# Patient Record
Sex: Female | Born: 1965
Health system: Southern US, Community
[De-identification: ages and names within clinical notes are randomized; demographics above are authoritative.]

## PROBLEM LIST (undated history)

## (undated) DIAGNOSIS — R000 Tachycardia, unspecified: Secondary | ICD-10-CM

## (undated) DIAGNOSIS — H548 Legal blindness, as defined in USA: Secondary | ICD-10-CM

## (undated) DIAGNOSIS — Q969 Turner's syndrome, unspecified: Secondary | ICD-10-CM

## (undated) DIAGNOSIS — H543 Unqualified visual loss, both eyes: Secondary | ICD-10-CM

## (undated) DIAGNOSIS — M810 Age-related osteoporosis without current pathological fracture: Secondary | ICD-10-CM

## (undated) DIAGNOSIS — K5790 Diverticulosis of intestine, part unspecified, without perforation or abscess without bleeding: Secondary | ICD-10-CM

## (undated) DIAGNOSIS — R945 Abnormal results of liver function studies: Secondary | ICD-10-CM

## (undated) DIAGNOSIS — D126 Benign neoplasm of colon, unspecified: Secondary | ICD-10-CM

## (undated) DIAGNOSIS — E109 Type 1 diabetes mellitus without complications: Secondary | ICD-10-CM

## (undated) DIAGNOSIS — H269 Unspecified cataract: Secondary | ICD-10-CM

## (undated) DIAGNOSIS — E785 Hyperlipidemia, unspecified: Secondary | ICD-10-CM

## (undated) DIAGNOSIS — R0600 Dyspnea, unspecified: Secondary | ICD-10-CM

## (undated) DIAGNOSIS — E11319 Type 2 diabetes mellitus with unspecified diabetic retinopathy without macular edema: Secondary | ICD-10-CM

## (undated) DIAGNOSIS — M1990 Unspecified osteoarthritis, unspecified site: Secondary | ICD-10-CM

## (undated) DIAGNOSIS — R7989 Other specified abnormal findings of blood chemistry: Secondary | ICD-10-CM

## (undated) DIAGNOSIS — I10 Essential (primary) hypertension: Secondary | ICD-10-CM

## (undated) DIAGNOSIS — K648 Other hemorrhoids: Secondary | ICD-10-CM

## (undated) DIAGNOSIS — H3320 Serous retinal detachment, unspecified eye: Secondary | ICD-10-CM

## (undated) HISTORY — DX: Age-related osteoporosis without current pathological fracture: M81.0

## (undated) HISTORY — PX: CATARACT EXTRACTION: SUR2

## (undated) HISTORY — DX: Hyperlipidemia, unspecified: E78.5

## (undated) HISTORY — DX: Other hemorrhoids: K64.8

## (undated) HISTORY — PX: RETINAL DETACHMENT SURGERY: SHX105

## (undated) HISTORY — DX: Type 2 diabetes mellitus with unspecified diabetic retinopathy without macular edema: E11.319

## (undated) HISTORY — DX: Unspecified cataract: H26.9

## (undated) HISTORY — DX: Unspecified osteoarthritis, unspecified site: M19.90

## (undated) HISTORY — DX: Turner's syndrome, unspecified: Q96.9

## (undated) HISTORY — DX: Dyspnea, unspecified: R06.00

## (undated) HISTORY — DX: Essential (primary) hypertension: I10

## (undated) HISTORY — PX: TONSILLECTOMY: SUR1361

## (undated) HISTORY — DX: Unqualified visual loss, both eyes: H54.3

## (undated) HISTORY — DX: Abnormal results of liver function studies: R94.5

## (undated) HISTORY — DX: Diverticulosis of intestine, part unspecified, without perforation or abscess without bleeding: K57.90

## (undated) HISTORY — PX: OTHER SURGICAL HISTORY: SHX169

## (undated) HISTORY — DX: Other specified abnormal findings of blood chemistry: R79.89

## (undated) HISTORY — DX: Benign neoplasm of colon, unspecified: D12.6

## (undated) HISTORY — DX: Type 1 diabetes mellitus without complications: E10.9

## (undated) HISTORY — DX: Tachycardia, unspecified: R00.0

## (undated) HISTORY — DX: Serous retinal detachment, unspecified eye: H33.20

## (undated) HISTORY — DX: Legal blindness, as defined in USA: H54.8

## (undated) HISTORY — PX: CHOLECYSTECTOMY: SHX55

---

## 2007-01-14 ENCOUNTER — Other Ambulatory Visit: Admission: RE | Admit: 2007-01-14 | Discharge: 2007-01-14 | Payer: Self-pay | Admitting: Obstetrics and Gynecology

## 2009-08-31 ENCOUNTER — Emergency Department (HOSPITAL_COMMUNITY): Admission: EM | Admit: 2009-08-31 | Discharge: 2009-08-31 | Payer: Self-pay | Admitting: Family Medicine

## 2009-09-07 ENCOUNTER — Ambulatory Visit: Payer: Self-pay | Admitting: Internal Medicine

## 2009-09-07 ENCOUNTER — Encounter: Payer: Self-pay | Admitting: Cardiovascular Disease

## 2009-10-06 ENCOUNTER — Encounter (INDEPENDENT_AMBULATORY_CARE_PROVIDER_SITE_OTHER): Payer: Self-pay | Admitting: Family Medicine

## 2009-10-06 ENCOUNTER — Ambulatory Visit: Payer: Self-pay | Admitting: Internal Medicine

## 2009-10-06 LAB — CONVERTED CEMR LAB
ALT: 125 units/L — ABNORMAL HIGH (ref 0–35)
AST: 109 units/L — ABNORMAL HIGH (ref 0–37)
Albumin: 3.7 g/dL (ref 3.5–5.2)
Basophils Absolute: 0 10*3/uL (ref 0.0–0.1)
Calcium: 8.9 mg/dL (ref 8.4–10.5)
Chloride: 105 meq/L (ref 96–112)
Cholesterol: 273 mg/dL — ABNORMAL HIGH (ref 0–200)
Creatinine, Ser: 0.5 mg/dL (ref 0.40–1.20)
Eosinophils Absolute: 0 10*3/uL (ref 0.0–0.7)
HCT: 44.2 % (ref 36.0–46.0)
HDL: 93 mg/dL (ref >39)
Hemoglobin: 14.5 g/dL (ref 12.0–15.0)
MCV: 93.1 fL (ref 78.0–100.0)
Monocytes Absolute: 0.4 10*3/uL (ref 0.1–1.0)
Neutrophils Relative %: 82 % — ABNORMAL HIGH (ref 43–77)
RBC: 4.75 M/uL (ref 3.87–5.11)
Sodium: 143 meq/L (ref 135–145)
TSH: 1.372 microintl units/mL (ref 0.350–4.500)
Total Bilirubin: 0.6 mg/dL (ref 0.3–1.2)
Total CHOL/HDL Ratio: 2.9
Triglycerides: 87 mg/dL (ref <150)
VLDL: 17 mg/dL (ref 0–40)

## 2009-10-12 ENCOUNTER — Encounter (INDEPENDENT_AMBULATORY_CARE_PROVIDER_SITE_OTHER): Payer: Self-pay | Admitting: Internal Medicine

## 2009-10-12 LAB — CONVERTED CEMR LAB: Hep B C IgM: NEGATIVE

## 2009-10-15 ENCOUNTER — Encounter: Payer: Self-pay | Admitting: Cardiovascular Disease

## 2009-10-15 ENCOUNTER — Ambulatory Visit: Payer: Self-pay | Admitting: Internal Medicine

## 2009-10-15 ENCOUNTER — Encounter (INDEPENDENT_AMBULATORY_CARE_PROVIDER_SITE_OTHER): Payer: Self-pay | Admitting: Family Medicine

## 2009-10-15 LAB — CONVERTED CEMR LAB
Ferritin: 233 ng/mL (ref 10–291)
Iron: 69 ug/dL (ref 42–145)
TIBC: 249 ug/dL — ABNORMAL LOW (ref 250–470)

## 2009-10-21 ENCOUNTER — Ambulatory Visit: Payer: Self-pay | Admitting: Family Medicine

## 2009-10-22 ENCOUNTER — Encounter: Payer: Self-pay | Admitting: Cardiovascular Disease

## 2009-10-22 ENCOUNTER — Ambulatory Visit (HOSPITAL_COMMUNITY): Admission: RE | Admit: 2009-10-22 | Discharge: 2009-10-22 | Payer: Self-pay | Admitting: Internal Medicine

## 2009-10-28 ENCOUNTER — Ambulatory Visit: Payer: Self-pay | Admitting: Internal Medicine

## 2009-10-29 ENCOUNTER — Encounter: Admission: RE | Admit: 2009-10-29 | Discharge: 2009-10-29 | Payer: Self-pay | Admitting: Internal Medicine

## 2009-11-16 ENCOUNTER — Ambulatory Visit: Payer: Self-pay | Admitting: Internal Medicine

## 2010-04-30 ENCOUNTER — Emergency Department (HOSPITAL_COMMUNITY): Admission: EM | Admit: 2010-04-30 | Discharge: 2010-04-30 | Payer: Self-pay | Admitting: Family Medicine

## 2010-06-10 ENCOUNTER — Emergency Department (HOSPITAL_COMMUNITY)
Admission: EM | Admit: 2010-06-10 | Discharge: 2010-06-10 | Payer: Self-pay | Source: Home / Self Care | Admitting: Emergency Medicine

## 2010-06-30 ENCOUNTER — Encounter (INDEPENDENT_AMBULATORY_CARE_PROVIDER_SITE_OTHER): Payer: Self-pay | Admitting: *Deleted

## 2010-06-30 ENCOUNTER — Encounter: Payer: Self-pay | Admitting: Cardiovascular Disease

## 2010-06-30 LAB — CONVERTED CEMR LAB
ALT: 110 units/L — ABNORMAL HIGH (ref 0–35)
Alkaline Phosphatase: 338 units/L — ABNORMAL HIGH (ref 39–117)
Basophils Relative: 1 % (ref 0–1)
CO2: 24 meq/L (ref 19–32)
Chloride: 98 meq/L (ref 96–112)
Eosinophils Absolute: 0.1 10*3/uL (ref 0.0–0.7)
HCT: 40.2 % (ref 36.0–46.0)
Hemoglobin: 13.5 g/dL (ref 12.0–15.0)
Lymphocytes Relative: 18 % (ref 12–46)
Lymphs Abs: 1.9 10*3/uL (ref 0.7–4.0)
MCHC: 33.6 g/dL (ref 30.0–36.0)
MCV: 90.1 fL (ref 78.0–100.0)
Microalb, Ur: 3.25 mg/dL — ABNORMAL HIGH (ref 0.00–1.89)
Monocytes Relative: 6 % (ref 3–12)
Sed Rate: 38 mm/hr — ABNORMAL HIGH (ref 0–22)
Sodium: 135 meq/L (ref 135–145)
WBC: 10.2 10*3/uL (ref 4.0–10.5)

## 2010-07-04 ENCOUNTER — Encounter (INDEPENDENT_AMBULATORY_CARE_PROVIDER_SITE_OTHER): Payer: Self-pay | Admitting: Internal Medicine

## 2010-07-04 LAB — CONVERTED CEMR LAB
HCV Ab: NEGATIVE
Hep A Total Ab: NEGATIVE
Hep B Core Total Ab: NEGATIVE
Hep B S Ab: NEGATIVE

## 2010-09-27 LAB — GLUCOSE, CAPILLARY
Glucose-Capillary: 167 mg/dL — ABNORMAL HIGH (ref 70–99)
Glucose-Capillary: 85 mg/dL (ref 70–99)

## 2010-10-06 LAB — GLUCOSE, CAPILLARY: Glucose-Capillary: 476 mg/dL — ABNORMAL HIGH (ref 70–99)

## 2010-10-07 ENCOUNTER — Encounter: Payer: Self-pay | Admitting: Cardiovascular Disease

## 2010-10-21 ENCOUNTER — Encounter: Payer: Self-pay | Admitting: Cardiovascular Disease

## 2010-10-25 ENCOUNTER — Encounter: Payer: Self-pay | Admitting: Cardiovascular Disease

## 2010-10-25 ENCOUNTER — Ambulatory Visit (INDEPENDENT_AMBULATORY_CARE_PROVIDER_SITE_OTHER): Payer: Self-pay | Admitting: Cardiovascular Disease

## 2010-10-25 VITALS — BP 142/80 | HR 95 | Resp 18 | Ht <= 58 in | Wt 126.0 lb

## 2010-10-25 DIAGNOSIS — R0989 Other specified symptoms and signs involving the circulatory and respiratory systems: Secondary | ICD-10-CM

## 2010-10-25 DIAGNOSIS — R002 Palpitations: Secondary | ICD-10-CM | POA: Insufficient documentation

## 2010-10-25 DIAGNOSIS — R0600 Dyspnea, unspecified: Secondary | ICD-10-CM | POA: Insufficient documentation

## 2010-10-25 NOTE — Assessment & Plan Note (Signed)
Non-exertional dyspnea. Will arrange echo to exclude structural heart disease.

## 2010-10-25 NOTE — Patient Instructions (Signed)
Your physician recommends that you schedule a follow-up appointment in: 2-3 weeks with Dr. Clifton James Your physician has requested that you have an echocardiogram. Echocardiography is a painless test that uses sound waves to create images of your heart. It provides your doctor with information about the size and shape of your heart and how well your heart's chambers and valves are working. This procedure takes approximately one hour. There are no restrictions for this procedure.  Your physician has recommended that you wear a holter monitor for 48 hours. Holter monitors are medical devices that record the heart's electrical activity. Doctors most often use these monitors to diagnose arrhythmias. Arrhythmias are problems with the speed or rhythm of the heartbeat. The monitor is a small, portable device. You can wear one while you do your normal daily activities. This is usually used to diagnose what is causing palpitations/syncope (passing out).

## 2010-10-25 NOTE — Progress Notes (Signed)
History of Present Illness:45 yo female with history of DM, HTN, hyperlipidemia, Turner's syndrome who is here today as a self referral for evaluation of SOB and palpitations. She tells me that she has a high heart rate at rest, with HR of 95-105 in am. Over the last year, she has noticed palpitations, feeling that her heart is racing. 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 No chest pain. She also notes dyspnea. These episodes occur 4-5 times per week. She has had no dizziness, near syncope or syncope.   Past Medical History  Diagnosis Date  . Dyspnea   . Tachycardia   . DM (diabetes mellitus)   . Hypertension   . Hyperlipidemia     Past Surgical History  Procedure Date  . Hepatic adenoma removed   . Tonsillectomy     Current Outpatient Prescriptions  Medication Sig Dispense Refill  . insulin NPH (HUMULIN N,NOVOLIN N) 100 UNIT/ML injection Inject into the skin as directed.        . insulin regular (HUMULIN R,NOVOLIN R) 100 UNIT/ML injection Inject into the skin as directed.        Marland Kitchen lisinopril-hydrochlorothiazide (PRINZIDE,ZESTORETIC) 20-25 MG per tablet Take by mouth daily. 1/2 tab daily        No Known Allergies  History   Social History  . Marital Status: Married    Spouse Name: N/A    Number of Children: N/A  . Years of Education: N/A   Occupational History  . Not on file.   Social History Main Topics  . Smoking status: Former Games developer  . Smokeless tobacco: Not on file   Comment: many years ago  . Alcohol Use: No  . Drug Use: No  . Sexually Active: Not on file   Other Topics Concern  . Not on file   Social History Narrative  . No narrative on file    No family history on file.  Review of Systems:  As stated in the HPI and otherwise negative.   BP 142/80  Pulse 95  Resp 18  Ht 4\' 10"  (1.473 m)  Wt 126 lb (57.153 kg)  BMI 26.33 kg/m2  Physical Examination: General: Well developed, well nourished, NAD HEENT: OP clear, mucus membranes  moist SKIN: warm, dry. No rashes. Neuro: No focal deficits Musculoskeletal: Muscle strength 5/5 all ext Psychiatric: Mood and affect normal Neck: No JVD, no carotid bruits, no thyromegaly, no lymphadenopathy. Lungs:Clear bilaterally, no wheezes, rhonci, crackles Cardiovascular: Regular rate and rhythm. No murmurs, gallops or rubs. Abdomen:Soft. Bowel sounds present. Non-tender.  Extremities: No lower extremity edema. Pulses are 2 + in the bilateral DP/PT.  EKG:NSR, rate 95 bpm. Normal EKG.

## 2010-10-25 NOTE — Assessment & Plan Note (Signed)
Will arrange 48 hour holter monitor.

## 2010-10-28 ENCOUNTER — Ambulatory Visit (INDEPENDENT_AMBULATORY_CARE_PROVIDER_SITE_OTHER): Payer: Medicaid Other

## 2010-10-28 DIAGNOSIS — R0600 Dyspnea, unspecified: Secondary | ICD-10-CM

## 2010-10-28 DIAGNOSIS — R002 Palpitations: Secondary | ICD-10-CM

## 2010-11-01 ENCOUNTER — Ambulatory Visit (HOSPITAL_COMMUNITY): Payer: Medicaid Other | Attending: Cardiovascular Disease | Admitting: Radiology

## 2010-11-01 DIAGNOSIS — R0609 Other forms of dyspnea: Secondary | ICD-10-CM | POA: Insufficient documentation

## 2010-11-01 DIAGNOSIS — R002 Palpitations: Secondary | ICD-10-CM | POA: Insufficient documentation

## 2010-11-01 DIAGNOSIS — I10 Essential (primary) hypertension: Secondary | ICD-10-CM | POA: Insufficient documentation

## 2010-11-01 DIAGNOSIS — I059 Rheumatic mitral valve disease, unspecified: Secondary | ICD-10-CM | POA: Insufficient documentation

## 2010-11-01 DIAGNOSIS — R0600 Dyspnea, unspecified: Secondary | ICD-10-CM

## 2010-11-01 DIAGNOSIS — E785 Hyperlipidemia, unspecified: Secondary | ICD-10-CM | POA: Insufficient documentation

## 2010-11-01 DIAGNOSIS — I319 Disease of pericardium, unspecified: Secondary | ICD-10-CM | POA: Insufficient documentation

## 2010-11-01 DIAGNOSIS — R0989 Other specified symptoms and signs involving the circulatory and respiratory systems: Secondary | ICD-10-CM | POA: Insufficient documentation

## 2010-11-01 DIAGNOSIS — E119 Type 2 diabetes mellitus without complications: Secondary | ICD-10-CM | POA: Insufficient documentation

## 2010-11-03 ENCOUNTER — Telehealth: Payer: Self-pay | Admitting: Cardiovascular Disease

## 2010-11-03 NOTE — Telephone Encounter (Signed)
Normal echo. Can we let the pt know? Thanks, chris

## 2010-11-03 NOTE — Telephone Encounter (Signed)
Patient is aware of test/lab results.  

## 2010-11-08 ENCOUNTER — Ambulatory Visit: Payer: Self-pay | Admitting: Cardiovascular Disease

## 2010-11-09 ENCOUNTER — Encounter: Payer: Self-pay | Admitting: Cardiovascular Disease

## 2010-11-09 ENCOUNTER — Ambulatory Visit (INDEPENDENT_AMBULATORY_CARE_PROVIDER_SITE_OTHER): Payer: Medicaid Other | Admitting: Cardiovascular Disease

## 2010-11-09 VITALS — BP 122/74 | HR 104 | Resp 17 | Ht <= 58 in | Wt 124.8 lb

## 2010-11-09 DIAGNOSIS — R002 Palpitations: Secondary | ICD-10-CM

## 2010-11-09 MED ORDER — DILTIAZEM HCL ER COATED BEADS 120 MG PO CP24: 120.0000 mg | ORAL_CAPSULE | Freq: Every day | ORAL | Status: DC

## 2010-11-09 NOTE — Assessment & Plan Note (Signed)
Holter monitor with PACs. Echo normal. Pt has resting tachycardia with the premature beats. Will start Cardizem CD 120 mg po Qdaily.

## 2010-11-09 NOTE — Patient Instructions (Signed)
Your physician recommends that you schedule a follow-up appointment in: 6 months with Dr. Clifton James  Your physician has recommended you make the following change in your medication: START CARDIZEM 120 mg daily.

## 2010-11-09 NOTE — Progress Notes (Signed)
History of Present Illness:45 yo female with history of DM, HTN, hyperlipidemia, Turner's syndrome who is here today for cardiac follow up. I saw her two weeks ago as a new patient  for evaluation of SOB and palpitations. She told  me that she has a high heart rate at rest, with HR of 95-105 in am. Over the last year, she has noticed palpitations, feeling that her heart is racing. 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 No chest pain. She also notes dyspnea. These episodes occur 4-5 times per week. She has had no dizziness, near syncope or syncope. I arranged a 48 Hour Holter monitor and an echo. Her Holter monitor showed PACs. There was a lot of artifact on the monitor. NO SVT. Her echo was normal.    Past Medical History  Diagnosis Date  . Dyspnea   . Tachycardia   . DM (diabetes mellitus)   . Hypertension   . Hyperlipidemia   . Turner's syndrome     Past Surgical History  Procedure Date  . Hepatic adenoma removed   . Tonsillectomy     Current Outpatient Prescriptions  Medication Sig Dispense Refill  . insulin NPH (HUMULIN N,NOVOLIN N) 100 UNIT/ML injection Inject into the skin as directed.        . insulin regular (HUMULIN R,NOVOLIN R) 100 UNIT/ML injection Inject into the skin as directed.        Marland Kitchen lisinopril-hydrochlorothiazide (PRINZIDE,ZESTORETIC) 20-25 MG per tablet Take by mouth daily. 1/2 tab daily      . Vitamin D, Ergocalciferol, (DRISDOL) 50000 UNITS CAPS Take 50,000 Units by mouth every 7 (seven) days.          No Known Allergies  History   Social History  . Marital Status: Married    Spouse Name: N/A    Number of Children: N/A  . Years of Education: N/A   Occupational History  . Not on file.   Social History Main Topics  . Smoking status: Former Games developer  . Smokeless tobacco: Not on file   Comment: many years ago  . Alcohol Use: No  . Drug Use: No  . Sexually Active: Not on file   Other Topics Concern  . Not on file   Social History  Narrative  . No narrative on file    No family history on file.  Review of Systems:  As stated in the HPI and otherwise negative.   BP 122/74  Pulse 104  Resp 17  Ht 4\' 10"  (1.473 m)  Wt 124 lb 12.8 oz (56.609 kg)  BMI 26.08 kg/m2  Physical Examination: General: Well developed, well nourished, NAD HEENT: OP clear, mucus membranes moist SKIN: warm, dry. No rashes. Neuro: No focal deficits Musculoskeletal: Muscle strength 5/5 all ext Psychiatric: Mood and affect normal Neck: No JVD, no carotid bruits, no thyromegaly, no lymphadenopathy. Lungs:Clear bilaterally, no wheezes, rhonci, crackles Cardiovascular: Regular rate and rhythm. No murmurs, gallops or rubs. Abdomen:Soft. Bowel sounds present. Non-tender.  Extremities: No lower extremity edema. Pulses are 2 + in the bilateral DP/PT.  Echo 11/01/10:  Left ventricle: The cavity size was normal. Wall thickness was normal. Systolic function was normal. The estimated ejection fraction was in the range of 60% to 65%. Wall motion was normal; there were no regional wall motion abnormalities. Doppler parameters are consistent with abnormal left ventricular relaxation (grade 1 diastolic dysfunction). - Pericardium, extracardiac: A trivial pericardial effusion was identified.

## 2010-11-12 ENCOUNTER — Inpatient Hospital Stay (INDEPENDENT_AMBULATORY_CARE_PROVIDER_SITE_OTHER)
Admission: RE | Admit: 2010-11-12 | Discharge: 2010-11-12 | Disposition: A | Discharge status: Home / Self Care | Payer: Medicaid Other | Source: Ambulatory Visit | Attending: Emergency Medicine | Admitting: Emergency Medicine

## 2010-11-12 DIAGNOSIS — E119 Type 2 diabetes mellitus without complications: Secondary | ICD-10-CM

## 2010-11-18 ENCOUNTER — Telehealth: Payer: Self-pay | Admitting: Cardiovascular Disease

## 2010-11-18 NOTE — Telephone Encounter (Signed)
Has question about meds. Thinks she missed a return call.

## 2010-11-18 NOTE — Telephone Encounter (Signed)
Pt calling re new meds she has been on for a week and a half now has questions

## 2010-11-21 NOTE — Telephone Encounter (Signed)
LMTCB Tina [name]   PT RETURN CALL STILL CONTINUES TO HAVE HR IN MID 80'S BEFORE GETTING OUT OF BED  ALSO B/P IS STILL RUNNING 135-140/85 FEELS OKAY WAS JUST RECENTLY STARTED  ON CARDIZEM 120 MG QD PT AWARE WILL FORWARD TO DR Antonietta Breach

## 2010-11-21 NOTE — Telephone Encounter (Signed)
Would not make any changes at this time. HR in the 80s is ok. Can we touch base with her Tina [name] and make sure she is ok? Thanks, chris

## 2010-11-22 NOTE — Telephone Encounter (Signed)
Patient is feeling okay. She is aware to call us if she experiences any other symptoms.

## 2010-12-09 ENCOUNTER — Encounter: Payer: Self-pay | Admitting: Cardiovascular Disease

## 2010-12-09 ENCOUNTER — Telehealth: Payer: Self-pay | Admitting: Cardiovascular Disease

## 2010-12-09 DIAGNOSIS — I10 Essential (primary) hypertension: Secondary | ICD-10-CM

## 2010-12-09 MED ORDER — LISINOPRIL-HYDROCHLOROTHIAZIDE 20-25 MG PO TABS: 1.0000 | ORAL_TABLET | Freq: Every day | ORAL | Status: DC

## 2010-12-09 NOTE — Telephone Encounter (Signed)
Pt needs refill of Lisinopril 20/25 mg from [facility], not going there now dr Clifton James said he would refill her meds -uses [facility] [employer] pt [phone]

## 2010-12-10 ENCOUNTER — Telehealth: Payer: Self-pay | Admitting: Adult Health

## 2010-12-13 NOTE — Telephone Encounter (Signed)
Spoke with pharmacist and they are aware that patient is only prescribed to take Lisinopril-HCTZ 20-25 1/2 tablet daily.

## 2010-12-13 NOTE — Telephone Encounter (Signed)
She should be taking 1/2 tablet per day. cdm

## 2011-01-05 ENCOUNTER — Inpatient Hospital Stay (INDEPENDENT_AMBULATORY_CARE_PROVIDER_SITE_OTHER)
Admission: RE | Admit: 2011-01-05 | Discharge: 2011-01-05 | Disposition: A | Discharge status: Home / Self Care | Payer: Medicaid Other | Source: Ambulatory Visit | Attending: Family Medicine | Admitting: Family Medicine

## 2011-01-05 DIAGNOSIS — E119 Type 2 diabetes mellitus without complications: Secondary | ICD-10-CM

## 2011-08-09 ENCOUNTER — Telehealth: Payer: Self-pay | Admitting: Cardiovascular Disease

## 2011-08-09 NOTE — Telephone Encounter (Signed)
Dr.McAlhany received a Cardiac Impairment Questionaire. He completed I faxed back to [phone] 08/09/11/km

## 2011-08-15 ENCOUNTER — Telehealth: Payer: Self-pay | Admitting: Cardiovascular Disease

## 2011-08-15 NOTE — Telephone Encounter (Signed)
All Cardiac fxed to [facility] @ [phone] 08/15/11/KM

## 2011-08-16 ENCOUNTER — Telehealth: Payer: Self-pay | Admitting: Cardiovascular Disease

## 2011-08-16 NOTE — Telephone Encounter (Signed)
Sounds like a good plan. I am happy with a HR of 80s. Thanks, chris

## 2011-08-16 NOTE — Telephone Encounter (Signed)
New Msg: Pt calling stating that she has been taking a whole pill of lisinopril 20 mg--starting about one week ago-before that pt was taking half of that. Taking a whole tablet and it has been helping pt BP.   Pt is taking diltiazem and pt HR is not stabilizing. Pt said when she wakes her HR is 85-92. Pt wants to discuss if she needs to alter medication.   Please return pt call to discuss further.

## 2011-08-16 NOTE — Telephone Encounter (Signed)
Spoke with pt. She states she stopped Lisinopril/HCTZ several months ago. Blood pressure started running around 170/90 so she recently resumed lisinopril/HCTZ at prescribed dose of half tablet daily. Also resumed Cardizem at that time.  Blood pressure continued to run around 148/93 so she increased to whole tablet daily this past week.  BP now 122/80. She has appt scheduled to see Dr. Clifton James on Feb. 22, 2013. She is concerned about heart rate in mid 80's and I told her that heart rate was OK.  Will let Dr. Clifton James know of med change

## 2011-08-23 ENCOUNTER — Encounter (HOSPITAL_COMMUNITY): Payer: Self-pay | Admitting: *Deleted

## 2011-08-23 ENCOUNTER — Inpatient Hospital Stay (HOSPITAL_COMMUNITY)
Admission: EM | Admit: 2011-08-23 | Discharge: 2011-08-28 | DRG: 638 | Disposition: A | Discharge status: Home / Self Care | Payer: Medicaid Other | Source: Ambulatory Visit | Attending: Family Medicine | Admitting: Family Medicine

## 2011-08-23 ENCOUNTER — Other Ambulatory Visit: Payer: Self-pay

## 2011-08-23 ENCOUNTER — Emergency Department (HOSPITAL_COMMUNITY): Payer: Medicaid Other

## 2011-08-23 DIAGNOSIS — H3320 Serous retinal detachment, unspecified eye: Secondary | ICD-10-CM | POA: Diagnosis present

## 2011-08-23 DIAGNOSIS — E876 Hypokalemia: Secondary | ICD-10-CM | POA: Diagnosis present

## 2011-08-23 DIAGNOSIS — R002 Palpitations: Secondary | ICD-10-CM

## 2011-08-23 DIAGNOSIS — E872 Acidosis, unspecified: Secondary | ICD-10-CM | POA: Diagnosis present

## 2011-08-23 DIAGNOSIS — R0600 Dyspnea, unspecified: Secondary | ICD-10-CM

## 2011-08-23 DIAGNOSIS — Z8669 Personal history of other diseases of the nervous system and sense organs: Secondary | ICD-10-CM

## 2011-08-23 DIAGNOSIS — N390 Urinary tract infection, site not specified: Secondary | ICD-10-CM | POA: Diagnosis present

## 2011-08-23 DIAGNOSIS — D72829 Elevated white blood cell count, unspecified: Secondary | ICD-10-CM | POA: Diagnosis present

## 2011-08-23 DIAGNOSIS — H4310 Vitreous hemorrhage, unspecified eye: Secondary | ICD-10-CM | POA: Diagnosis present

## 2011-08-23 DIAGNOSIS — Z87891 Personal history of nicotine dependence: Secondary | ICD-10-CM

## 2011-08-23 DIAGNOSIS — I10 Essential (primary) hypertension: Secondary | ICD-10-CM | POA: Diagnosis present

## 2011-08-23 DIAGNOSIS — K219 Gastro-esophageal reflux disease without esophagitis: Secondary | ICD-10-CM | POA: Diagnosis present

## 2011-08-23 DIAGNOSIS — N179 Acute kidney failure, unspecified: Secondary | ICD-10-CM | POA: Diagnosis present

## 2011-08-23 DIAGNOSIS — IMO0001 Reserved for inherently not codable concepts without codable children: Secondary | ICD-10-CM | POA: Diagnosis present

## 2011-08-23 DIAGNOSIS — E1310 Other specified diabetes mellitus with ketoacidosis without coma: Secondary | ICD-10-CM | POA: Diagnosis present

## 2011-08-23 DIAGNOSIS — E860 Dehydration: Secondary | ICD-10-CM | POA: Diagnosis present

## 2011-08-23 DIAGNOSIS — R739 Hyperglycemia, unspecified: Secondary | ICD-10-CM | POA: Diagnosis present

## 2011-08-23 DIAGNOSIS — R112 Nausea with vomiting, unspecified: Secondary | ICD-10-CM | POA: Diagnosis present

## 2011-08-23 DIAGNOSIS — N19 Unspecified kidney failure: Secondary | ICD-10-CM

## 2011-08-23 DIAGNOSIS — E1110 Type 2 diabetes mellitus with ketoacidosis without coma: Secondary | ICD-10-CM

## 2011-08-23 LAB — DIFFERENTIAL
Basophils Absolute: 0 10*3/uL (ref 0.0–0.1)
Basophils Relative: 0 % (ref 0–1)
Eosinophils Absolute: 0 10*3/uL (ref 0.0–0.7)
Monocytes Absolute: 1 10*3/uL (ref 0.1–1.0)
Neutro Abs: 13.5 10*3/uL — ABNORMAL HIGH (ref 1.7–7.7)
Neutrophils Relative %: 84 % — ABNORMAL HIGH (ref 43–77)

## 2011-08-23 LAB — COMPREHENSIVE METABOLIC PANEL
BUN: 65 mg/dL — ABNORMAL HIGH (ref 6–23)
Calcium: 10.2 mg/dL (ref 8.4–10.5)
GFR calc Af Amer: 43 mL/min — ABNORMAL LOW (ref >90)
Glucose, Bld: 340 mg/dL — ABNORMAL HIGH (ref 70–99)
Sodium: 133 mEq/L — ABNORMAL LOW (ref 135–145)
Total Protein: 7 g/dL (ref 6.0–8.3)

## 2011-08-23 LAB — LIPASE, BLOOD: Lipase: 12 U/L (ref 11–59)

## 2011-08-23 LAB — CBC
Hemoglobin: 14.7 g/dL (ref 12.0–15.0)
MCH: 30.8 pg (ref 26.0–34.0)
MCHC: 37.1 g/dL — ABNORMAL HIGH (ref 30.0–36.0)
RDW: 12.1 % (ref 11.5–15.5)

## 2011-08-23 LAB — GLUCOSE, CAPILLARY: Glucose-Capillary: 342 mg/dL — ABNORMAL HIGH (ref 70–99)

## 2011-08-23 MED ORDER — SODIUM CHLORIDE 0.9 % IV SOLN
INTRAVENOUS | Status: DC
  Filled 2011-08-23: qty 1

## 2011-08-23 MED ORDER — SODIUM CHLORIDE 0.9 % IV SOLN
INTRAVENOUS | Status: DC
  Administered 2011-08-24: 01:00:00 via INTRAVENOUS

## 2011-08-23 MED ORDER — SODIUM CHLORIDE 0.9 % IV BOLUS (SEPSIS)
1000.0000 mL | Freq: Once | INTRAVENOUS | Status: AC
  Administered 2011-08-23: 1000 mL via INTRAVENOUS

## 2011-08-23 MED ORDER — SODIUM CHLORIDE 0.9 % IV BOLUS (SEPSIS)
1000.0000 mL | Freq: Once | INTRAVENOUS | Status: AC
  Administered 2011-08-24: 1000 mL via INTRAVENOUS

## 2011-08-23 MED ORDER — ONDANSETRON HCL 4 MG/2ML IJ SOLN
4.0000 mg | Freq: Once | INTRAMUSCULAR | Status: AC
  Administered 2011-08-23: 4 mg via INTRAVENOUS
  Filled 2011-08-23: qty 2

## 2011-08-23 MED ORDER — DEXTROSE 50 % IV SOLN: 25.0000 mL | INTRAVENOUS | Status: DC | PRN

## 2011-08-23 NOTE — ED Provider Notes (Addendum)
History     CSN: [id number]  Arrival date & time 08/23/11  2212   First MD Initiated Contact with Patient 08/23/11 2213      Chief Complaint  Patient presents with  . Weakness    HPI The patient presents with fatigue and nausea.  She notes that since a recent outpatient procedure (cataract right eye) she has been persistently fatigued.  She denies any other remarkable events prior to the onset of symptoms.  Since onset, one week ago, her symptoms have been persistent, despite of medications.  No clear exacerbating factors.  She notes that her fatigue has made her incapable of performing activities of daily living.  She denies any fevers, chills, confusion, disorientation, vomiting, diarrhea, dysuria, rash, cough. Past Medical History  Diagnosis Date  . Dyspnea   . Tachycardia   . DM (diabetes mellitus)   . Hypertension   . Hyperlipidemia   . Turner's syndrome     Past Surgical History  Procedure Date  . Hepatic adenoma removed   . Tonsillectomy     No family history on file.  History  Substance Use Topics  . Smoking status: Former Games developer  . Smokeless tobacco: Not on file   Comment: many years ago  . Alcohol Use: No    OB History    Grav Para Term Preterm Abortions TAB SAB Ect Mult Living                  Review of Systems  Constitutional:       HPI  HENT:       HPI otherwise negative  Eyes: Negative.   Respiratory:       HPI, otherwise negative  Cardiovascular:       HPI, otherwise nmegative  Gastrointestinal: Negative for vomiting.  Genitourinary:       HPI, otherwise negative  Musculoskeletal:       HPI, otherwise negative  Skin: Negative.   Neurological: Negative for syncope.    Allergies  Review of patient's allergies indicates no known allergies.  Home Medications   Current Outpatient Rx  Name Route Sig Dispense Refill  . DILTIAZEM HCL ER COATED BEADS 120 MG PO CP24 Oral Take 1 capsule (120 mg total) by mouth daily. 30 capsule 11  .  INSULIN ISOPHANE HUMAN 100 UNIT/ML [place] SUSP Subcutaneous Inject into the skin as directed.      . INSULIN REGULAR HUMAN 100 UNIT/ML IJ SOLN Subcutaneous Inject into the skin as directed.      Marland Kitchen LISINOPRIL-HYDROCHLOROTHIAZIDE 20-25 MG PO TABS Oral Take 1 tablet by mouth daily. 1/2 tab daily 30 tablet 3  . VITAMIN D (ERGOCALCIFEROL) 50000 UNITS PO CAPS Oral Take 50,000 Units by mouth every 7 (seven) days.        There were no vitals taken for this visit.  Physical Exam  Nursing note and vitals reviewed. Constitutional: She is oriented to person, place, and time. She appears listless.  HENT:  Head: Normocephalic and atraumatic.  Eyes: Right conjunctiva is injected. Right conjunctiva has a hemorrhage. Left conjunctiva is not injected. Left conjunctiva has no hemorrhage. Right eye exhibits normal extraocular motion. Left eye exhibits normal extraocular motion.    Cardiovascular: Normal rate and regular rhythm.   Pulmonary/Chest: Effort normal and breath sounds normal. No stridor. No respiratory distress.  Abdominal: Normal appearance. There is no tenderness.  Musculoskeletal: She exhibits no edema and no tenderness.  Neurological: She is oriented to person, place, and time. She appears listless. No cranial  nerve deficit. She exhibits normal muscle tone. Coordination normal.  Skin: Skin is warm. No rash noted. No erythema. No pallor.    ED Course  Procedures (including critical care time)   Labs Reviewed  CBC  DIFFERENTIAL  COMPREHENSIVE METABOLIC PANEL  LIPASE, BLOOD  URINALYSIS, ROUTINE W REFLEX MICROSCOPIC   No results found.   No diagnosis found.    Date: 08/23/2011  Rate: 100  Rhythm: sinus tachycardia  QRS Axis: normal  Intervals: QT prolonged  ST/T Wave abnormalities: nonspecific T wave changes  Conduction Disutrbances:none  Narrative Interpretation:   Old EKG Reviewed: none available ABNORMAL ECG  XR: reviewed by me   MDM  This 46 year old insulin-dependent  diabetic now presents with one week of fatigue, listlessness.  On exam she is in no distress, though she is uncomfortable appearing.  The patient's vital signs are stable.  Given her description of complaints, is concern for sepsis versus DKA.  The patient's labs are notable for hyperglycemia, anion gap, new renal failure.  The patient had empirically received IV fluids, had not initially received glucose, due to her taking 30 units of insulin just prior to calling EMS.  Following the return of labs the patient was started on a glucose stabilizer protocol, received additional IV fluids.  The patient's renal failure may be 2/2 dehydration vs. DKA, with consideration of RTA.  She will be admitted to a step down unit for continued evaluation and management.   Gerhard Munch, MD 08/23/11 2359  CRITICAL CARE Performed by: Gerhard Munch   Total critical care time: 35  Critical care time was exclusive of separately billable procedures and treating other patients.  Critical care was necessary to treat or prevent imminent or life-threatening deterioration.  Critical care was time spent personally by me on the following activities: development of treatment plan with patient and/or surrogate as well as nursing, discussions with consultants, evaluation of patient's response to treatment, examination of patient, obtaining history from patient or surrogate, ordering and performing treatments and interventions, ordering and review of laboratory studies, ordering and review of radiographic studies, pulse oximetry and re-evaluation of patient's condition.   Gerhard Munch, MD 08/23/11 [phone]

## 2011-08-23 NOTE — ED Notes (Signed)
EKG DONE BY R Bryanda Mikel

## 2011-08-23 NOTE — ED Notes (Signed)
CBG CHECKED BY EMT R [id number]

## 2011-08-23 NOTE — ED Notes (Signed)
NEW EKG GIVEN TO DR LOCKWOOD, NO OLD EKG

## 2011-08-23 NOTE — ED Notes (Signed)
Patient was brought in by EMS for weakness since having surgery on Thursday for a detached retina right eye.  Patient states she has been unable to eat or drink much since surgery.  Glucose en route was 411 but patient states she has taken her insulin 15 units of regular 25 units Novolin prior to coming.  States she is not having any pain, but constant nausea and vomiting.

## 2011-08-24 ENCOUNTER — Encounter (HOSPITAL_COMMUNITY): Payer: Self-pay | Admitting: Internal Medicine

## 2011-08-24 DIAGNOSIS — R112 Nausea with vomiting, unspecified: Secondary | ICD-10-CM | POA: Diagnosis present

## 2011-08-24 DIAGNOSIS — E860 Dehydration: Secondary | ICD-10-CM | POA: Diagnosis present

## 2011-08-24 DIAGNOSIS — E876 Hypokalemia: Secondary | ICD-10-CM | POA: Diagnosis present

## 2011-08-24 DIAGNOSIS — N390 Urinary tract infection, site not specified: Secondary | ICD-10-CM | POA: Diagnosis present

## 2011-08-24 DIAGNOSIS — R739 Hyperglycemia, unspecified: Secondary | ICD-10-CM | POA: Diagnosis present

## 2011-08-24 DIAGNOSIS — H4310 Vitreous hemorrhage, unspecified eye: Secondary | ICD-10-CM | POA: Diagnosis present

## 2011-08-24 DIAGNOSIS — E872 Acidosis: Secondary | ICD-10-CM | POA: Diagnosis present

## 2011-08-24 DIAGNOSIS — D72829 Elevated white blood cell count, unspecified: Secondary | ICD-10-CM | POA: Diagnosis present

## 2011-08-24 DIAGNOSIS — I10 Essential (primary) hypertension: Secondary | ICD-10-CM | POA: Diagnosis present

## 2011-08-24 DIAGNOSIS — E1310 Other specified diabetes mellitus with ketoacidosis without coma: Secondary | ICD-10-CM | POA: Diagnosis present

## 2011-08-24 DIAGNOSIS — Z8669 Personal history of other diseases of the nervous system and sense organs: Secondary | ICD-10-CM

## 2011-08-24 LAB — URINALYSIS, ROUTINE W REFLEX MICROSCOPIC
Nitrite: NEGATIVE
Specific Gravity, Urine: 1.02 (ref 1.005–1.030)
Urobilinogen, UA: 0.2 mg/dL (ref 0.0–1.0)

## 2011-08-24 LAB — CBC
HCT: 36.8 % (ref 36.0–46.0)
Hemoglobin: 13.8 g/dL (ref 12.0–15.0)
MCHC: 37.5 g/dL — ABNORMAL HIGH (ref 30.0–36.0)
MCV: 82.7 fL (ref 78.0–100.0)
RDW: 12.1 % (ref 11.5–15.5)
WBC: 14.8 10*3/uL — ABNORMAL HIGH (ref 4.0–10.5)

## 2011-08-24 LAB — GLUCOSE, CAPILLARY
Glucose-Capillary: 96 mg/dL (ref 70–99)
Glucose-Capillary: 153 mg/dL — ABNORMAL HIGH (ref 70–99)
Glucose-Capillary: 143 mg/dL — ABNORMAL HIGH (ref 70–99)
Glucose-Capillary: 96 mg/dL (ref 70–99)
Glucose-Capillary: 270 mg/dL — ABNORMAL HIGH (ref 70–99)

## 2011-08-24 LAB — BASIC METABOLIC PANEL
BUN: 61 mg/dL — ABNORMAL HIGH (ref 6–23)
CO2: 18 mEq/L — ABNORMAL LOW (ref 19–32)
Chloride: 105 mEq/L (ref 96–112)
Creatinine, Ser: 1.18 mg/dL — ABNORMAL HIGH (ref 0.50–1.10)
GFR calc Af Amer: 64 mL/min — ABNORMAL LOW (ref >90)
Potassium: 3.3 mEq/L — ABNORMAL LOW (ref 3.5–5.1)

## 2011-08-24 LAB — HEMOGLOBIN A1C: Mean Plasma Glucose: 280 mg/dL — ABNORMAL HIGH (ref <117)

## 2011-08-24 LAB — KETONES, QUALITATIVE

## 2011-08-24 MED ORDER — POTASSIUM CHLORIDE 10 MEQ/100ML IV SOLN
10.0000 meq | INTRAVENOUS | Status: AC
  Administered 2011-08-24 (×3): 10 meq via INTRAVENOUS
  Filled 2011-08-24 (×3): qty 100

## 2011-08-24 MED ORDER — ONDANSETRON HCL 4 MG/2ML IJ SOLN
4.0000 mg | Freq: Four times a day (QID) | INTRAMUSCULAR | Status: DC | PRN
  Administered 2011-08-24 – 2011-08-26 (×2): 4 mg via INTRAVENOUS
  Filled 2011-08-24 (×2): qty 2

## 2011-08-24 MED ORDER — ONDANSETRON HCL 4 MG PO TABS: 4.0000 mg | ORAL_TABLET | Freq: Four times a day (QID) | ORAL | Status: DC | PRN

## 2011-08-24 MED ORDER — INSULIN ASPART 100 UNIT/ML ~~LOC~~ SOLN
0.0000 [IU] | SUBCUTANEOUS | Status: DC
  Administered 2011-08-24: 2 [IU] via SUBCUTANEOUS
  Administered 2011-08-24 – 2011-08-25 (×2): 8 [IU] via SUBCUTANEOUS
  Administered 2011-08-25: 3 [IU] via SUBCUTANEOUS
  Administered 2011-08-25: 11 [IU] via SUBCUTANEOUS
  Administered 2011-08-25: 8 [IU] via SUBCUTANEOUS
  Administered 2011-08-25: 2 [IU] via SUBCUTANEOUS
  Administered 2011-08-25: 5 [IU] via SUBCUTANEOUS
  Administered 2011-08-26: 15 [IU] via SUBCUTANEOUS
  Administered 2011-08-26: 2 [IU] via SUBCUTANEOUS
  Administered 2011-08-26: 5 [IU] via SUBCUTANEOUS
  Administered 2011-08-26: 3 [IU] via SUBCUTANEOUS
  Filled 2011-08-24: qty 3

## 2011-08-24 MED ORDER — BIMATOPROST 0.03 % OP SOLN: 1.0000 [drp] | Freq: Every day | OPHTHALMIC | Status: DC

## 2011-08-24 MED ORDER — LOTEPREDNOL ETABONATE 0.5 % OP SUSP
1.0000 [drp] | Freq: Four times a day (QID) | OPHTHALMIC | Status: DC
  Administered 2011-08-24 – 2011-08-28 (×19): 1 [drp] via OPHTHALMIC
  Filled 2011-08-24: qty 5

## 2011-08-24 MED ORDER — DILTIAZEM HCL ER COATED BEADS 120 MG PO CP24
120.0000 mg | ORAL_CAPSULE | Freq: Every day | ORAL | Status: DC
  Administered 2011-08-25 – 2011-08-28 (×4): 120 mg via ORAL
  Filled 2011-08-24 (×5): qty 1

## 2011-08-24 MED ORDER — GATIFLOXACIN 0.5 % OP SOLN
1.0000 [drp] | Freq: Four times a day (QID) | OPHTHALMIC | Status: DC
  Administered 2011-08-24 – 2011-08-28 (×19): 1 [drp] via OPHTHALMIC
  Filled 2011-08-24: qty 2.5

## 2011-08-24 MED ORDER — PROMETHAZINE HCL 25 MG/ML IJ SOLN
12.5000 mg | Freq: Four times a day (QID) | INTRAMUSCULAR | Status: DC | PRN
  Administered 2011-08-24 – 2011-08-25 (×4): 12.5 mg via INTRAVENOUS
  Filled 2011-08-24 (×5): qty 1

## 2011-08-24 MED ORDER — PROMETHAZINE HCL 25 MG PO TABS: 25.0000 mg | ORAL_TABLET | Freq: Four times a day (QID) | ORAL | Status: DC | PRN

## 2011-08-24 MED ORDER — ONDANSETRON HCL 4 MG PO TABS
4.0000 mg | ORAL_TABLET | ORAL | Status: DC | PRN
  Administered 2011-08-28: 4 mg via ORAL
  Filled 2011-08-24: qty 1

## 2011-08-24 MED ORDER — BESIFLOXACIN HCL 0.6 % OP SUSP: 1.0000 [drp] | Freq: Three times a day (TID) | OPHTHALMIC | Status: DC

## 2011-08-24 MED ORDER — SODIUM CHLORIDE 0.9 % IJ SOLN
3.0000 mL | Freq: Two times a day (BID) | INTRAMUSCULAR | Status: DC
  Administered 2011-08-24 – 2011-08-26 (×2): 3 mL via INTRAVENOUS

## 2011-08-24 MED ORDER — ONDANSETRON HCL 4 MG/2ML IJ SOLN
4.0000 mg | Freq: Once | INTRAMUSCULAR | Status: AC
  Administered 2011-08-24: 4 mg via INTRAVENOUS

## 2011-08-24 MED ORDER — ONDANSETRON HCL 4 MG/2ML IJ SOLN
4.0000 mg | Freq: Three times a day (TID) | INTRAMUSCULAR | Status: DC | PRN
  Administered 2011-08-24: 4 mg via INTRAVENOUS
  Filled 2011-08-24: qty 2

## 2011-08-24 MED ORDER — DEXTROSE 5 % IV SOLN
1.0000 g | INTRAVENOUS | Status: DC
  Administered 2011-08-24 – 2011-08-27 (×4): 1 g via INTRAVENOUS
  Filled 2011-08-24 (×5): qty 10

## 2011-08-24 MED ORDER — DEXTROSE 50 % IV SOLN
INTRAVENOUS | Status: AC
  Administered 2011-08-24: 25 mL
  Filled 2011-08-24: qty 50

## 2011-08-24 MED ORDER — BIMATOPROST 0.01 % OP SOLN
1.0000 [drp] | Freq: Every day | OPHTHALMIC | Status: DC
  Administered 2011-08-24 – 2011-08-27 (×4): 1 [drp] via OPHTHALMIC
  Filled 2011-08-24: qty 2.5

## 2011-08-24 MED ORDER — POTASSIUM CHLORIDE IN NACL 40-0.9 MEQ/L-% IV SOLN
INTRAVENOUS | Status: DC
  Administered 2011-08-24 – 2011-08-27 (×8): via INTRAVENOUS
  Filled 2011-08-24 (×17): qty 1000

## 2011-08-24 MED ORDER — ONDANSETRON HCL 4 MG/2ML IJ SOLN
4.0000 mg | Freq: Four times a day (QID) | INTRAMUSCULAR | Status: DC | PRN
  Administered 2011-08-24: 4 mg via INTRAVENOUS
  Filled 2011-08-24: qty 2

## 2011-08-24 NOTE — Progress Notes (Signed)
CBG: 63  Treatment: D50 IV 25 mL  Symptoms: None  Follow-up CBG: Time:0915 CBG Result:153  Possible Reasons for Event: Vomiting  Comments/MD notified:yes, Rizwan MD    Elsie Saas

## 2011-08-24 NOTE — Progress Notes (Signed)
   CARE MANAGEMENT NOTE 08/24/2011  Patient:  MYKELA, MEWBORN   Account Number:  [id number]  Date Initiated:  08/24/2011  Documentation initiated by:  Onnie Boer  Subjective/Objective Assessment:   PT WAS ADMITTED WITH WEAKNES, N/V AND METABOLIC ACIDOSIS     Action/Plan:   PROGRESSION OF CARE AND DISCHARGE PLANNING   Anticipated DC Date:  08/27/2011   Anticipated DC Plan:  HOME/SELF CARE      DC Planning Services  CM consult      Choice offered to / List presented to:             Status of service:  In process, will continue to follow Medicare Important Message given?   (If response is "NO", the following Medicare IM given date fields will be blank) Date Medicare IM given:   Date Additional Medicare IM given:    Discharge Disposition:    Per UR Regulation:  Reviewed for med. necessity/level of care/duration of stay  Comments:  UR COMPLETED.  Kamsiyochukwu Buist, RN,BSN 08/25/11 1203 PT WAS ADMITTED WITH THE ABOVE S/S, PTA PT WAS AT HOME WITH SELF CARE.  WILL F/U ON DC NEEDS.

## 2011-08-24 NOTE — ED Notes (Signed)
cbg 178, Insulin gtt held per Dr. Conley Rolls, pending vbg & acetone results. 2nd liter hung, kvo, zofran given for nausea. Sleepy, "weak", interactive, calm, NAD, family intermittantly at [facility].

## 2011-08-24 NOTE — ED Notes (Signed)
CBG CHECKED BY EMT R [id number]

## 2011-08-24 NOTE — ED Notes (Signed)
Pt resting on R side, family at [facility], admitting MD Dr. Conley Rolls at [facility], lab into room.

## 2011-08-24 NOTE — Progress Notes (Signed)
[facility]   Subjective: Nausea and emesis persist after admission. Patient still lethargic but sick she is sleepy. Patient clarified preadmission history. States 3 days ago she had surgery to treat a vitreous hemorrhage as well as a retinal detachment and reports persistent nausea and vomiting since that time. Apparently did not notify the eye surgeon and the symptoms and was not receiving any antiemetic therapy. Because she was unable to eat she was not taking her usual doses of NPH nor her milk over short-acting insulin. In addition she was not regularly checking her CBGs. Prior to admission she reports she did check CBG which was too high to read on the monitor. She denies shortness of breath or chest pain.  Objective: Vital signs in last 24 hours: Temp:  [97.5 F (36.4 C)-99.4 F (37.4 C)] 99 F (37.2 C) (02/07 1147) Pulse Rate:  [91-103] 96  (02/07 1147) Resp:  [16-26] 19  (02/07 1147) BP: (92-150)/(55-79) 150/79 mmHg (02/07 1147) SpO2:  [98 %-100 %] 100 % (02/07 1147) Weight:  [52.3 kg (115 lb 4.8 oz)] 52.3 kg (115 lb 4.8 oz) (02/07 0149) Weight change:  Last BM Date: 08/22/11  Intake/Output from previous day: 02/06 0701 - 02/07 0700 In: 1550 [I.V.:1250; IV Piggyback:300] Out: 800 [Urine:700; Emesis/NG output:100] Intake/Output this shift:    General appearance: cooperative, appears older than stated age, fatigued and no distress EENT: Right eye with periorbital discoloration consistent with bruising although both eyes with periorbital darkening that appears consistent with volume depletion; right scleral and conjunctival regions with mild hemorrhagic appearance consistent with recent surgery Resp: clear to auscultation bilaterally, room air maintaining saturations between 99%; mild tachypnea but this is primarily associated with episodes of nausea Cardio: regular rate and rhythm, S1, S2 normal, no murmur, click, rub or gallop, IV fluids at 125 cc per hour GI: soft,  non-tender; bowel sounds normal; no masses,  no organomegaly Extremities: extremities normal, atraumatic, no cyanosis or edema Neurologic: Grossly normal  Lab Results:  Basename 08/24/11 0408 08/23/11 2228  WBC 14.8* 16.1*  HGB 13.8 14.7  HCT 36.8 39.6  PLT 376 432*   BMET  Basename 08/24/11 0408 08/23/11 2228  NA 137 133*  K 3.3* 3.2*  CL 105 97  CO2 18* 13*  GLUCOSE 100* 340*  BUN 61* 65*  CREATININE 1.18* 1.63*  CALCIUM 9.6 10.2    Studies/Results: Dg Chest 2 View  08/23/2011  *RADIOLOGY REPORT*  Clinical Data: Weakness  CHEST - 2 VIEW  Comparison: None.  Findings: Lungs are clear. No pleural effusion or pneumothorax. The cardiomediastinal contours are within normal limits. Suggestion of osteopenia.  Otherwise, visualized bones and soft tissues are without significant appreciable abnormality.  Surgical clips right upper quadrant.  IMPRESSION: No acute cardiopulmonary process.  Original Report Authenticated By: Waneta Martins, M.D.    Medications:  I have reviewed the patient's current medications. Scheduled:    . bimatoprost  1 drop Right Eye QHS  . cefTRIAXone (ROCEPHIN)  IV  1 g Intravenous Q24H  . dextrose      . diltiazem  120 mg Oral Daily  . gatifloxacin  1 drop Right Eye QID  . insulin aspart  0-15 Units Subcutaneous Q4H  . loteprednol  1 drop Right Eye QID  . ondansetron  4 mg Intravenous Once  . ondansetron  4 mg Intravenous Once  . potassium chloride  10 mEq Intravenous Q1 Hr x 3  . sodium chloride  1,000 mL Intravenous Once  . sodium chloride  1,000 mL Intravenous Once  . sodium chloride  3 mL Intravenous Q12H  . DISCONTD: Besifloxacin HCl  1 drop Right Eye TID  . DISCONTD: bimatoprost  1 drop Right Eye QHS  . DISCONTD: insulin (NOVOLIN-R) infusion   Intravenous To Major    Assessment/Plan:  Active Problems:  DKA (diabetic ketoacidoses)/Metabolic acidosis/Hyperglycemia Suspect etiology from nonadherence to usual insulin regimen. Sugars have  decreased as low as 66 for now we'll check frequently and utilize short-acting sliding scale insulin. His sugars increase oral intake improves we can resume more longer acting insulin and adjust rate based on oral intake and recent sugars. Anion gap has closed the patient has persistent nausea therefore will allow carbohydrate fluids until sugars are consistently greater than 150. Acidosis was also exacerbated by the Diamox she was on prior to admission so this medication was discontinued.   Dehydration Mediated by DKA as well as postoperative intractable nausea and vomiting preadmission. Will continue IV fluids at 125 cc per hour. Also contributing to severe dehydration was the fact patient was on Diamox as well as hydrochlorothiazide prior to admission.   History of retinal detachment/ Vitreous hemorrhage Recent surgical repair of above 3 days prior to admission. Subsequently she endorses had nausea and vomiting postoperatively. States was started on Diamox for short course postoperatively. Currently Diamox on hold due to dehydration as well as metabolic acidosis. May need to contact her ophthalmologist Dr. Stephannie Li with [facility] to clarify if we absolutely need to restart this medication.   Nausea & vomiting Etiology unclear but began after surgery. May have been related to the anesthetic agent. Patient denies severe pain that may have caused nausea vomiting. Unclear if she was on a pain medication which also may have contributed to nausea and vomiting. Will allow for Zofran and Phenergan for symptom management. After admission she was found to have a urinary tract infection which certainly can also cause nausea and vomiting.   UTI (lower urinary tract infection)/Leukocytosis Urinalysis consistent with glycosuria as well as a mild urinary tract infection. Urine culture is pending and today have started her on empiric Rocephin IV.   Hypokalemia Likely multifactorial related to DKA as  well as nausea and vomiting. We'll continue potassium and IV fluids. We'll also check electrolyte panel in the morning.  Hypertension Will continue home diltiazem but continue to hold ACE inhibitor with thiazide diuretic for now. Systolic blood pressure in the 120s and patient remains dehydrated.   Disposition Stable to transfer to non-telemetry floor.    LOS: 1 day   Junious Silk, ANP pager [phone]  [facility] [email or web address] Password: [email or web address]  08/24/2011, 3:44 PM  I have evaluated the patient and reviewed the chart. It appears that her Diabetes is uncontrolled as Hba1c is 11.4. At this point, sugars have been low and we are holding of on resuming her home dose of Insulin.   Calvert Cantor, MD [phone]

## 2011-08-24 NOTE — Progress Notes (Signed)
I have examined the patient and reviewed the chart. I agree with the above note.   Calvert Cantor, MD [phone]

## 2011-08-24 NOTE — Progress Notes (Signed)
[facility]   Subjective: Nausea and emesis persist after admission. Patient still lethargic but sick she is sleepy. Patient clarified preadmission history. States 3 days ago she had surgery to treat a vitreous hemorrhage as well as a retinal detachment and reports persistent nausea and vomiting since that time. Apparently did not notify the eye surgeon and the symptoms and was not receiving any antiemetic therapy. Because she was unable to eat she was not taking her usual doses of NPH nor her milk over short-acting insulin. In addition she was not regularly checking her CBGs. Prior to admission she reports she did check CBG which was too high to read on the monitor. She denies shortness of breath or chest pain.  Objective: Vital signs in last 24 hours: Temp:  [97.5 F (36.4 C)-99.4 F (37.4 C)] 99.1 F (37.3 C) (02/07 0733) Pulse Rate:  [91-103] 103  (02/07 0733) Resp:  [16-26] 26  (02/07 0733) BP: (92-121)/(55-79) 121/79 mmHg (02/07 0733) SpO2:  [98 %-100 %] 98 % (02/07 0733) Weight:  [52.3 kg (115 lb 4.8 oz)] 52.3 kg (115 lb 4.8 oz) (02/07 0149) Weight change:  Last BM Date: 08/22/11  Intake/Output from previous day: 02/06 0701 - 02/07 0700 In: 1550 [I.V.:1250; IV Piggyback:300] Out: 700 [Urine:700] Intake/Output this shift:    General appearance: cooperative, appears older than stated age, fatigued and no distress EENT: Right eye with periorbital discoloration consistent with bruising although both eyes with periorbital darkening that appears consistent with volume depletion; right scleral and conjunctival regions with mild hemorrhagic appearance consistent with recent surgery Resp: clear to auscultation bilaterally, room air maintaining saturations between 99%; mild tachypnea but this is primarily associated with episodes of nausea Cardio: regular rate and rhythm, S1, S2 normal, no murmur, click, rub or gallop, IV fluids at 125 cc per hour GI: soft, non-tender; bowel  sounds normal; no masses,  no organomegaly Extremities: extremities normal, atraumatic, no cyanosis or edema Neurologic: Grossly normal  Lab Results:  Basename 08/24/11 0408 08/23/11 2228  WBC 14.8* 16.1*  HGB 13.8 14.7  HCT 36.8 39.6  PLT 376 432*   BMET  Basename 08/24/11 0408 08/23/11 2228  NA 137 133*  K 3.3* 3.2*  CL 105 97  CO2 18* 13*  GLUCOSE 100* 340*  BUN 61* 65*  CREATININE 1.18* 1.63*  CALCIUM 9.6 10.2    Studies/Results: Dg Chest 2 View  08/23/2011  *RADIOLOGY REPORT*  Clinical Data: Weakness  CHEST - 2 VIEW  Comparison: None.  Findings: Lungs are clear. No pleural effusion or pneumothorax. The cardiomediastinal contours are within normal limits. Suggestion of osteopenia.  Otherwise, visualized bones and soft tissues are without significant appreciable abnormality.  Surgical clips right upper quadrant.  IMPRESSION: No acute cardiopulmonary process.  Original Report Authenticated By: Waneta Martins, M.D.    Medications:  I have reviewed the patient's current medications. Scheduled:   . bimatoprost  1 drop Right Eye QHS  . cefTRIAXone (ROCEPHIN)  IV  1 g Intravenous Q24H  . dextrose      . diltiazem  120 mg Oral Daily  . gatifloxacin  1 drop Right Eye QID  . insulin aspart  0-15 Units Subcutaneous Q4H  . loteprednol  1 drop Right Eye QID  . ondansetron  4 mg Intravenous Once  . ondansetron  4 mg Intravenous Once  . potassium chloride  10 mEq Intravenous Q1 Hr x 3  . sodium chloride  1,000 mL Intravenous Once  . sodium chloride  1,000 mL Intravenous  Once  . sodium chloride  3 mL Intravenous Q12H  . DISCONTD: Besifloxacin HCl  1 drop Right Eye TID  . DISCONTD: bimatoprost  1 drop Right Eye QHS  . DISCONTD: insulin (NOVOLIN-R) infusion   Intravenous To Major    Assessment/Plan:  Active Problems:  DKA (diabetic ketoacidoses)/Metabolic acidosis/Hyperglycemia Suspect etiology from nonadherence to usual insulin regimen. Sugars have decreased as low as 66  for now we'll check frequently and utilize short-acting sliding scale insulin. His sugars increase oral intake improves we can resume more longer acting insulin and adjust rate based on oral intake and recent sugars. Anion gap has closed the patient has persistent nausea therefore will allow carbohydrate fluids until sugars are consistently greater than 150. Acidosis was also exacerbated by the Diamox she was on prior to admission so this medication was discontinued.   Dehydration Mediated by DKA as well as postoperative intractable nausea and vomiting preadmission. Will continue IV fluids at 125 cc per hour. Also contributing to severe dehydration was the fact patient was on Diamox as well as hydrochlorothiazide prior to admission.   History of retinal detachment/ Vitreous hemorrhage Recent surgical repair of above 3 days prior to admission. Subsequently he endorses had nausea and vomiting postoperatively. States was started on Diamox for short course postoperatively. Currently Diamox on hold due to dehydration as well as metabolic acidosis. May need to contact her ophthalmologist Dr. Stephannie Li with [facility] to clarify if we absolutely need to restart this medication.   Nausea & vomiting Etiology unclear but began after surgery. May have been related to the anesthetic agent. Patient denies severe pain that may have caused nausea vomiting. Unclear if she was on a pain medication which also may have contributed to nausea and vomiting. Will allow for Zofran and Phenergan for symptom management. After admission she was found to have a urinary tract infection which certainly can also cause nausea and vomiting.   UTI (lower urinary tract infection)/Leukocytosis Urinalysis consistent with glycosuria as well as a mild urinary tract infection. Urine culture is pending and today have started her on empiric Rocephin IV.   Hypokalemia Likely multifactorial related to DKA as well as nausea and  vomiting. We'll continue potassium and IV fluids. We'll also check electrolyte panel in the morning.  Hypertension Will continue home diltiazem but continue to hold ACE inhibitor with thiazide diuretic for now. Systolic blood pressure in the 120s and patient remains dehydrated.   Disposition Stable to transfer to non-telemetry floor.    LOS: 1 day   Junious Silk, ANP pager [phone]  [facility] [email or web address] Password: [email or web address]  08/24/2011, 10:15 AM

## 2011-08-24 NOTE — Progress Notes (Signed)
Pt arrived to the unit with dx. ARF, N/V. Pt alert and oriented x4. She ambulatory x1 assist. Pt states she has been unsteady on her feet for the past 2 months. She is cont of bowel and bladder. She denies any pain or nausea at this time. No skin break down noted. VS stable. Pt's sclera in her right eye is red. Pt states she has no vision in that eye. No drainage present on assessment. Pt oriented to staff and unit. Will cont to monitor.

## 2011-08-24 NOTE — Progress Notes (Signed)
Report called to Thia, RN on [id number]. Pt. Will transfer to 6742 via wheelchair. Pt. Belongings with patient. Husband recently went home and is aware of transfer as well as room number. He will return later this evening. Pt. Is stable, HR 88 NSR, 100% O2sats on RA. Elsie Saas

## 2011-08-24 NOTE — H&P (Signed)
PCP:   Irean Hong, MD, MD   Chief Complaint: Weakness, nausea and vomiting.   HPI: Tina [name] is an 46 y.o. female with history of diabetes diagnosed at age 3, prior history of DKA, hypertension, hyperlipidemia, presents to the emergency room complaining of diffuse fatigue, nausea, vomiting for the past 2 days. It should be noted that she had retinal detachment surgery about 5 days ago, was placed on topical steroid drops along with Diamox (to decrease intra-ocular pressure). She denied fever, chills, cough, dysuria, abdominal pain, chest pain or shortness of breath. Evaluation in the emergency room included a blood glucose of 324, bicarbonate of 13 with anion gap of 23, and chloride of 87. She has elevation of her creatinine to 1.6, much above her baseline. Her chest x-ray is clear. Hospitalist was asked to admit her for question of DKA and acute renal failure.  Rewiew of Systems:  The patient denies, fever, weight loss,, vision loss, decreased hearing, hoarseness, chest pain, syncope, dyspnea on exertion, peripheral edema, balance deficits, hemoptysis, abdominal pain, melena, hematochezia, severe indigestion/heartburn, hematuria, incontinence, genital sores, muscle weakness, suspicious skin lesions, transient blindness, difficulty walking, depression, unusual weight change, abnormal bleeding, enlarged lymph nodes, angioedema, and breast masses.    Past Medical History  Diagnosis Date  . Dyspnea   . Tachycardia   . DM (diabetes mellitus)   . Hypertension   . Hyperlipidemia   . Turner's syndrome     Past Surgical History  Procedure Date  . Hepatic adenoma removed   . Tonsillectomy   . Retinal detachment surgery     Medications:  HOME MEDS: Prior to Admission medications   Medication Sig Start Date End Date Taking? Authorizing Provider  acetaZOLAMIDE (DIAMOX) 250 MG tablet Take 250 mg by mouth 2 (two) times daily.   Yes Historical Provider, MD  Besifloxacin HCl 0.6  % SUSP Place 1 drop into the right eye 3 (three) times daily.   Yes Historical Provider, MD  bimatoprost (LUMIGAN) 0.03 % ophthalmic solution Place 1 drop into the right eye at bedtime.   Yes Historical Provider, MD  diltiazem (CARDIZEM CD) 120 MG 24 hr capsule Take 1 capsule (120 mg total) by mouth daily. 11/09/10 11/09/11 Yes Verne Carrow, MD  insulin NPH (HUMULIN N,NOVOLIN N) 100 UNIT/ML injection Inject 25 Units into the skin as directed.    Yes Historical Provider, MD  insulin regular (HUMULIN R,NOVOLIN R) 100 UNIT/ML injection Inject 15 Units into the skin as directed.    Yes Historical Provider, MD  lisinopril-hydrochlorothiazide (PRINZIDE,ZESTORETIC) 20-25 MG per tablet Take 1 tablet by mouth daily. 1/2 tab daily 12/09/10  Yes Verne Carrow, MD  loteprednol (LOTEMAX) 0.5 % ophthalmic suspension Place 1 drop into the right eye 4 (four) times daily.   Yes Historical Provider, MD     Allergies:  No Known Allergies  Social History:   reports that she has quit smoking. She has never used smokeless tobacco. She reports that she does not drink alcohol or use illicit drugs.  Family History: History reviewed. No pertinent family history.   Physical Exam: Filed Vitals:   08/23/11 2220 08/24/11 0101 08/24/11 0149  BP: 95/61 92/58 97/55   Pulse: 101  96  Temp: 97.5 F (36.4 C) 99.4 F (37.4 C) 99.1 F (37.3 C)  TempSrc: Oral Oral Oral  Resp: 18 16 16   Height:   4\' 11"  (1.499 m)  Weight:   52.3 kg (115 lb 4.8 oz)  SpO2: 100% 98% 100%   Blood pressure 97/55,  pulse 96, temperature 99.1 F (37.3 C), temperature source Oral, resp. rate 16, height 4\' 11"  (1.499 m), weight 52.3 kg (115 lb 4.8 oz), SpO2 100.00%.  GEN:  Pleasant  person lying in the stretcher in no acute distress; cooperative with exam. She does appear chronically ill  PSYCH:  alert and oriented x4; does  appear anxious but does not appear depressed; affect is normal HEENT: Mucous membranes pink and anicteric;  PERRLA; EOM intact; no cervical lymphadenopathy nor thyromegaly or carotid bruit; no JVD; she has swollen conjunctiva along with erythema of her right eye. Breasts:: Not examined CHEST WALL: No tenderness CHEST: Normal respiration, clear to auscultation bilaterally HEART: Regular rate and rhythm; no murmurs rubs or gallops BACK: No kyphosis or scoliosis; no CVA tenderness ABDOMEN: Obese, soft non-tender; no masses, no organomegaly, normal abdominal bowel sounds; no pannus; no intertriginous candida. Rectal Exam: Not done EXTREMITIES: No bone or joint deformity; age-appropriate arthropathy of the hands and knees; no edema; no ulcerations. Genitalia: not examined PULSES: 2+ and symmetric SKIN: Normal hydration no rash or ulceration CNS: Cranial nerves 2-12 grossly intact no focal neurologic deficit   Labs & Imaging Results for orders placed during the hospital encounter of 08/23/11 (from the past 48 hour(s))  CBC     Status: Abnormal   Collection Time   08/23/11 10:28 PM      Component Value Range Comment   WBC 16.1 (*) 4.0 - 10.5 (K/uL)    RBC 4.77  3.87 - 5.11 (MIL/uL)    Hemoglobin 14.7  12.0 - 15.0 (g/dL)    HCT 09.8  11.9 - 14.7 (%)    MCV 83.0  78.0 - 100.0 (fL)    MCH 30.8  26.0 - 34.0 (pg)    MCHC 37.1 (*) 30.0 - 36.0 (g/dL) SPHEROCYTES   RDW 82.9  11.5 - 15.5 (%)    Platelets 432 (*) 150 - 400 (K/uL)   DIFFERENTIAL     Status: Abnormal   Collection Time   08/23/11 10:28 PM      Component Value Range Comment   Neutrophils Relative 84 (*) 43 - 77 (%)    Lymphocytes Relative 10 (*) 12 - 46 (%)    Monocytes Relative 6  3 - 12 (%)    Eosinophils Relative 0  0 - 5 (%)    Basophils Relative 0  0 - 1 (%)    Neutro Abs 13.5 (*) 1.7 - 7.7 (K/uL)    Lymphs Abs 1.6  0.7 - 4.0 (K/uL)    Monocytes Absolute 1.0  0.1 - 1.0 (K/uL)    Eosinophils Absolute 0.0  0.0 - 0.7 (K/uL)    Basophils Absolute 0.0  0.0 - 0.1 (K/uL)    WBC Morphology MILD LEFT SHIFT (1-5% METAS, OCC MYELO, OCC BANDS)    TOXIC GRANULATION  COMPREHENSIVE METABOLIC PANEL     Status: Abnormal   Collection Time   08/23/11 10:28 PM      Component Value Range Comment   Sodium 133 (*) 135 - 145 (mEq/L)    Potassium 3.2 (*) 3.5 - 5.1 (mEq/L)    Chloride 97  96 - 112 (mEq/L)    CO2 13 (*) 19 - 32 (mEq/L)    Glucose, Bld 340 (*) 70 - 99 (mg/dL)    BUN 65 (*) 6 - 23 (mg/dL)    Creatinine, Ser 5.62 (*) 0.50 - 1.10 (mg/dL)    Calcium 13.0  8.4 - 10.5 (mg/dL)    Total Protein 7.0  6.0 -  8.3 (g/dL)    Albumin 3.0 (*) 3.5 - 5.2 (g/dL)    AST 11  0 - 37 (U/L)    ALT 40 (*) 0 - 35 (U/L)    Alkaline Phosphatase 262 (*) 39 - 117 (U/L)    Total Bilirubin 0.5  0.3 - 1.2 (mg/dL)    GFR calc non Af Amer 37 (*) >90 (mL/min)    GFR calc Af Amer 43 (*) >90 (mL/min)   LIPASE, BLOOD     Status: Normal   Collection Time   08/23/11 10:28 PM      Component Value Range Comment   Lipase 12  11 - 59 (U/L)   GLUCOSE, CAPILLARY     Status: Abnormal   Collection Time   08/23/11 10:37 PM      Component Value Range Comment   Glucose-Capillary 342 (*) 70 - 99 (mg/dL)   KETONES, QUALITATIVE     Status: Abnormal   Collection Time   08/24/11 12:23 AM      Component Value Range Comment   Acetone, Bld SMALL (*) NEGATIVE    GLUCOSE, CAPILLARY     Status: Abnormal   Collection Time   08/24/11 12:36 AM      Component Value Range Comment   Glucose-Capillary 178 (*) 70 - 99 (mg/dL)    Dg Chest 2 View  03/22/453  *RADIOLOGY REPORT*  Clinical Data: Weakness  CHEST - 2 VIEW  Comparison: None.  Findings: Lungs are clear. No pleural effusion or pneumothorax. The cardiomediastinal contours are within normal limits. Suggestion of osteopenia.  Otherwise, visualized bones and soft tissues are without significant appreciable abnormality.  Surgical clips right upper quadrant.  IMPRESSION: No acute cardiopulmonary process.  Original Report Authenticated By: Waneta Martins, M.D.      Assessment Present on Admission:  .Metabolic  acidosis .Hyperglycemia .Retinal detachment Mild acute renal failure, likely prerenal. Abnormal CBC.  PLAN: I am not convinced that she has severe DKA. We'll get serum ketones. I think she might have a combination of mild DKA, worsening with Diamox causing more metabolic acidosis. Her elevated creatinine is likely from dehydration. She does have toxic granulation along with leukocytosis, but I will hold off on antibiotic as there is no definite source. She will be pancultured. Her potassium is low and will be repleted. She will need IV fluids, discontinuation of Diamox, and follow electrolytes closely. She is stable, full code, and will be admitted to step down under [facility].   Other plans as per orders.   Duward Allbritton 08/24/2011, 3:46 AM

## 2011-08-25 LAB — GLUCOSE, CAPILLARY
Glucose-Capillary: 192 mg/dL — ABNORMAL HIGH (ref 70–99)
Glucose-Capillary: 237 mg/dL — ABNORMAL HIGH (ref 70–99)
Glucose-Capillary: 273 mg/dL — ABNORMAL HIGH (ref 70–99)
Glucose-Capillary: 311 mg/dL — ABNORMAL HIGH (ref 70–99)

## 2011-08-25 LAB — URINE CULTURE
Colony Count: NO GROWTH
Culture  Setup Time: 201302071356

## 2011-08-25 LAB — COMPREHENSIVE METABOLIC PANEL
AST: 22 U/L (ref 0–37)
Albumin: 2.3 g/dL — ABNORMAL LOW (ref 3.5–5.2)
Alkaline Phosphatase: 182 U/L — ABNORMAL HIGH (ref 39–117)
BUN: 27 mg/dL — ABNORMAL HIGH (ref 6–23)
Chloride: 115 mEq/L — ABNORMAL HIGH (ref 96–112)
Potassium: 4.1 mEq/L (ref 3.5–5.1)
Sodium: 141 mEq/L (ref 135–145)
Total Protein: 5.4 g/dL — ABNORMAL LOW (ref 6.0–8.3)

## 2011-08-25 LAB — BASIC METABOLIC PANEL
CO2: 19 mEq/L (ref 19–32)
Chloride: 108 mEq/L (ref 96–112)
Sodium: 136 mEq/L (ref 135–145)

## 2011-08-25 LAB — CBC
HCT: 32.6 % — ABNORMAL LOW (ref 36.0–46.0)
MCHC: 36.2 g/dL — ABNORMAL HIGH (ref 30.0–36.0)
Platelets: 234 10*3/uL (ref 150–400)
RDW: 12.7 % (ref 11.5–15.5)
WBC: 9.7 10*3/uL (ref 4.0–10.5)

## 2011-08-25 NOTE — Progress Notes (Signed)
Utilization review complete 

## 2011-08-25 NOTE — Progress Notes (Signed)
Subjective:  Pt mentions that she feels better and mentions that she has been having difficulties affording her diabetic medication.  Also states "I could be taking better care of myself."  I have discussed the importance of good blood sugar control with the patient.  She states that ever since her husband has been granted disability she has lost her medicare and as a result has had difficulty affording her medication.  Will discuss with case manager and she is aware of situation.   Objective: Filed Vitals:   08/24/11 2300 08/25/11 0557 08/25/11 0934 08/25/11 1400  BP:  143/81 165/97 149/88  Pulse:  95 96 98  Temp:  98.4 F (36.9 C) 98.5 F (36.9 C) 98.7 F (37.1 C)  TempSrc:  Oral Oral Oral  Resp:  16 20 20   Height:      Weight: 52.5 kg (115 lb 11.9 oz)     SpO2:  99% 99% 99%   Weight change: 0.2 kg (7.1 oz)  Intake/Output Summary (Last 24 hours) at 08/25/11 1646 Last data filed at 08/25/11 1300  Gross per 24 hour  Intake 2283.67 ml  Output      0 ml  Net 2283.67 ml    General: Alert, awake, oriented x3, in no acute distress.  HEENT: No bruits, no goiter.  Heart: Regular rate and rhythm, without murmurs, rubs, gallops.  Lungs: Clear to auscultation Abdomen: Soft, nontender, nondistended, positive bowel sounds.  Neuro: Grossly intact, nonfocal.   Lab Results:  [facility] 08/25/11 0610 08/24/11 0408  NA 141 137  K 4.1 3.3*  CL 115* 105  CO2 18* 18*  GLUCOSE 151* 100*  BUN 27* 61*  CREATININE 0.76 1.18*  CALCIUM 8.8 9.6  MG -- --  PHOS -- --    Basename 08/25/11 0610 08/23/11 2228  AST 22 11  ALT 29 40*  ALKPHOS 182* 262*  BILITOT 0.5 0.5  PROT 5.4* 7.0  ALBUMIN 2.3* 3.0*    Basename 08/23/11 2228  LIPASE 12  AMYLASE --    Basename 08/25/11 0610 08/24/11 0408 08/23/11 2228  WBC 9.7 14.8* --  NEUTROABS -- -- 13.5*  HGB 11.8* 13.8 --  HCT 32.6* 36.8 --  MCV 85.1 82.7 --  PLT 234 376 --   No results found for this basename:  CKTOTAL:3,CKMB:3,CKMBINDEX:3,TROPONINI:3 in the last 72 hours No components found with this basename: POCBNP:3 No results found for this basename: DDIMER:2 in the last 72 hours  Basename 08/24/11 0408  HGBA1C 11.4*   No results found for this basename: CHOL:2,HDL:2,LDLCALC:2,TRIG:2,CHOLHDL:2,LDLDIRECT:2 in the last 72 hours No results found for this basename: TSH,T4TOTAL,FREET3,T3FREE,THYROIDAB in the last 72 hours No results found for this basename: VITAMINB12:2,FOLATE:2,FERRITIN:2,TIBC:2,IRON:2,RETICCTPCT:2 in the last 72 hours  Micro Results: Recent Results (from the past 240 hour(s))  MRSA PCR SCREENING     Status: Normal   Collection Time   08/24/11  2:18 AM      Component Value Range Status Comment   MRSA by PCR NEGATIVE  NEGATIVE  Final   URINE CULTURE     Status: Normal   Collection Time   08/24/11  6:20 AM      Component Value Range Status Comment   Specimen Description URINE, CLEAN CATCH   Final    Special Requests NONE   Final    Culture  Setup Time [id number]   Final    Colony Count NO GROWTH   Final    Culture NO GROWTH   Final    Report Status 08/25/2011 FINAL  Final   CULTURE, BLOOD (ROUTINE X 2)     Status: Normal (Preliminary result)   Collection Time   08/24/11  6:50 AM      Component Value Range Status Comment   Specimen Description BLOOD RIGHT ARM   Final    Special Requests BOTTLES DRAWN AEROBIC AND ANAEROBIC 10CC EACH   Final    Culture  Setup Time [id number]   Final    Culture     Final    Value:        BLOOD CULTURE RECEIVED NO GROWTH TO DATE CULTURE WILL BE HELD FOR 5 DAYS BEFORE ISSUING A FINAL NEGATIVE REPORT   Report Status PENDING   Incomplete   CULTURE, BLOOD (ROUTINE X 2)     Status: Normal (Preliminary result)   Collection Time   08/24/11  6:55 AM      Component Value Range Status Comment   Specimen Description BLOOD RIGHT ARM   Final    Special Requests BOTTLES DRAWN AEROBIC ONLY 2CC   Final    Culture  Setup Time [id number]   Final     Culture     Final    Value:        BLOOD CULTURE RECEIVED NO GROWTH TO DATE CULTURE WILL BE HELD FOR 5 DAYS BEFORE ISSUING A FINAL NEGATIVE REPORT   Report Status PENDING   Incomplete     Studies/Results: Dg Chest 2 View  08/23/2011  *RADIOLOGY REPORT*  Clinical Data: Weakness  CHEST - 2 VIEW  Comparison: None.  Findings: Lungs are clear. No pleural effusion or pneumothorax. The cardiomediastinal contours are within normal limits. Suggestion of osteopenia.  Otherwise, visualized bones and soft tissues are without significant appreciable abnormality.  Surgical clips right upper quadrant.  IMPRESSION: No acute cardiopulmonary process.  Original Report Authenticated By: Waneta Martins, M.D.    Medications: I have reviewed the patient's current medications.   Patient Active Hospital Problem List: Metabolic acidosis (08/24/2011) Improving on current regimen.  Will plan on continuing current regimen.  Was likely related to poorly controlled diabetes with a history of poor compliance and recent diamox which is currently being held. Hyperglycemia (08/24/2011) Improved will continue current insulin regimen.  Last BMP blood glucose 151  Dehydration (08/24/2011) Resolving with IVF rehydration.  BUN Creatinine ration is decreasing  History of retinal detachment (08/24/2011) Pt is still on her lumigan, zymaxid, and lotemax.  Stable will discuss with patient's ophthalmologist tomorrow.  Nausea & vomiting (08/24/2011) Resolving.  Likely due to acidosis which is improving off the diamox, IVF's, and insulin.  Vitreous hemorrhage (08/24/2011) Stable  DKA (diabetic ketoacidoses) (08/24/2011) Was mild and has been improving with the insulin and IVF hydration.   Will recheck BMP today and tomorrow in the AM.  CO2 improved is 18 and Anion gap 8 based on morning labs.  Will continue to monitor.  Will place consult to diabetes coordinator reason being would like to know if we can switch patient to lantus as  outpatient so that she can have one agent to control her DM therefore making it more likely to have improved compliance.  UTI (lower urinary tract infection) (08/24/2011) Will order urine culture if not already done.  Continue Rocephin may have contributed to uncontrolled blood sugars at home.  Leukocytosis (08/24/2011) Improved on Rocephin.  Likely due to UTI  Hypokalemia (08/24/2011) Resolved currently will recheck BMP today and tomorrow in the AM.  HTN (hypertension) (08/24/2011) Last blood pressure 149/88 but has ranged and has  been 121/79 which is at goal.  Will continue to monitor blood pressures and may change antihypertensives pending blood pressure readings.     LOS: 2 days   Penny Pia M.D.  [facility] 08/25/2011, 4:46 PM

## 2011-08-25 NOTE — Progress Notes (Signed)
   CARE MANAGEMENT NOTE 08/25/2011  Patient:  BOOTS, MCGLOWN   Account Number:  [id number]  Date Initiated:  08/24/2011  Documentation initiated by:  Onnie Boer  Subjective/Objective Assessment:   PT WAS ADMITTED WITH WEAKNES, N/V AND METABOLIC ACIDOSIS     Action/Plan:   PROGRESSION OF CARE AND DISCHARGE PLANNING   Anticipated DC Date:  08/27/2011   Anticipated DC Plan:  HOME/SELF CARE      DC Planning Services  CM consult      Choice offered to / List presented to:             Status of service:  In process, will continue to follow Medicare Important Message given?   (If response is "NO", the following Medicare IM given date fields will be blank) Date Medicare IM given:   Date Additional Medicare IM given:    Discharge Disposition:    Per UR Regulation:  Reviewed for med. necessity/level of care/duration of stay  Comments:  08/25/11- 1200- Donn Pierini RN, BSN [phone] Spoke with pt and husband at bedside- per conversation they state that they are fininacially in a hardship. FC-Ashley as spoken with pt via TC and is following. Pt states that she has had medicaid in past but does not currently- is trying to follow up with DSS- has left messages but has not received call back- pt says she pays out of pocket for medications when she can- per pharmacy pt is eligible for indigent fund for medication assistance at discharge if needed.  Pt also reports that she has applied for disability and that it is in the appeal process. Offered to set pt up with [facility]- pt states she can no longer go there, also offered [employer]- pt declined that also stating that she can not afford the sliding scale that they use. Pt declines offer for CSW to see her for potential community resources- stating that she knows all about resources and there are none for her.  Pt to see if church members might can assist with home needs such as cleaning. Pt reports that she has an upcoming appointment with  her cardiologist- McAlhany for 09/09/11. she has no other PCP- CM to continue to follow.  UR COMPLETED.  JENNIFER CLARK, RN,BSN 08/25/11 1203 PT WAS ADMITTED WITH THE ABOVE S/S, PTA PT WAS AT HOME WITH SELF CARE.  WILL F/U ON DC NEEDS.

## 2011-08-26 LAB — BASIC METABOLIC PANEL
BUN: 9 mg/dL (ref 6–23)
CO2: 20 mEq/L (ref 19–32)
Chloride: 112 mEq/L (ref 96–112)
GFR calc Af Amer: >90 mL/min (ref >90)
Potassium: 4.3 mEq/L (ref 3.5–5.1)

## 2011-08-26 LAB — GLUCOSE, CAPILLARY
Glucose-Capillary: 250 mg/dL — ABNORMAL HIGH (ref 70–99)
Glucose-Capillary: 403 mg/dL — ABNORMAL HIGH (ref 70–99)
Glucose-Capillary: 355 mg/dL — ABNORMAL HIGH (ref 70–99)
Glucose-Capillary: 432 mg/dL — ABNORMAL HIGH (ref 70–99)
Glucose-Capillary: 226 mg/dL — ABNORMAL HIGH (ref 70–99)
Glucose-Capillary: 237 mg/dL — ABNORMAL HIGH (ref 70–99)

## 2011-08-26 MED ORDER — INSULIN ASPART 100 UNIT/ML ~~LOC~~ SOLN
0.0000 [IU] | Freq: Every day | SUBCUTANEOUS | Status: DC
  Administered 2011-08-26 – 2011-08-27 (×2): 2 [IU] via SUBCUTANEOUS

## 2011-08-26 MED ORDER — INSULIN GLARGINE 100 UNIT/ML ~~LOC~~ SOLN
10.0000 [IU] | Freq: Every day | SUBCUTANEOUS | Status: DC
  Administered 2011-08-26: 10 [IU] via SUBCUTANEOUS
  Filled 2011-08-26: qty 3

## 2011-08-26 MED ORDER — INSULIN ASPART 100 UNIT/ML ~~LOC~~ SOLN
0.0000 [IU] | Freq: Three times a day (TID) | SUBCUTANEOUS | Status: DC
  Administered 2011-08-26: 5 [IU] via SUBCUTANEOUS
  Administered 2011-08-27 (×2): 15 [IU] via SUBCUTANEOUS
  Administered 2011-08-27 – 2011-08-28 (×2): 5 [IU] via SUBCUTANEOUS
  Administered 2011-08-28: 8 [IU] via SUBCUTANEOUS
  Filled 2011-08-26: qty 3

## 2011-08-26 MED ORDER — PANTOPRAZOLE SODIUM 40 MG IV SOLR
40.0000 mg | INTRAVENOUS | Status: DC
  Administered 2011-08-26: 40 mg via INTRAVENOUS
  Filled 2011-08-26 (×2): qty 40

## 2011-08-26 NOTE — Progress Notes (Signed)
Subjective: Patient states that she has had more strength lately and has been eating better today.  Denies any fever or chills currently.  Was called because patient's blood sugar this morning was elevated after she ate.  Blood sugars have been varying today and appetite has been better.  No acute issues overnight.  Objective: Filed Vitals:   08/26/11 0959 08/26/11 1103 08/26/11 1300 08/26/11 1742  BP: 145/93 145/93 172/85 158/82  Pulse: 100  107 91  Temp: 98.4 F (36.9 C)  97.2 F (36.2 C) 97.4 F (36.3 C)  TempSrc: Oral  Oral Oral  Resp: 18  19 17   Height:      Weight:      SpO2: 100%  100% 100%   Weight change: 1.977 kg (4 lb 5.7 oz)  Intake/Output Summary (Last 24 hours) at 08/26/11 1813 Last data filed at 08/26/11 1700  Gross per 24 hour  Intake   3285 ml  Output      0 ml  Net   3285 ml    General: Alert, awake, oriented x3, in no acute distress.  HEENT: No bruits, no goiter. Subconjunctival hemorrhage right eye Heart: Regular rate and rhythm, without murmurs, rubs, gallops.  Lungs: CTA BL Abdomen: Soft, nontender, nondistended, positive bowel sounds.  Neuro: Grossly intact, nonfocal.   Lab Results:  Basename 08/26/11 0600 08/25/11 1709  NA 139 136  K 4.3 3.7  CL 112 108  CO2 20 19  GLUCOSE 178* 314*  BUN 9 17  CREATININE 0.57 0.65  CALCIUM 9.0 8.9  MG -- --  PHOS -- --    Basename 08/25/11 0610 08/23/11 2228  AST 22 11  ALT 29 40*  ALKPHOS 182* 262*  BILITOT 0.5 0.5  PROT 5.4* 7.0  ALBUMIN 2.3* 3.0*    Basename 08/23/11 2228  LIPASE 12  AMYLASE --    Basename 08/25/11 0610 08/24/11 0408 08/23/11 2228  WBC 9.7 14.8* --  NEUTROABS -- -- 13.5*  HGB 11.8* 13.8 --  HCT 32.6* 36.8 --  MCV 85.1 82.7 --  PLT 234 376 --   No results found for this basename: CKTOTAL:3,CKMB:3,CKMBINDEX:3,TROPONINI:3 in the last 72 hours No components found with this basename: POCBNP:3 No results found for this basename: DDIMER:2 in the last 72 hours  Basename  08/24/11 0408  HGBA1C 11.4*   No results found for this basename: CHOL:2,HDL:2,LDLCALC:2,TRIG:2,CHOLHDL:2,LDLDIRECT:2 in the last 72 hours No results found for this basename: TSH,T4TOTAL,FREET3,T3FREE,THYROIDAB in the last 72 hours No results found for this basename: VITAMINB12:2,FOLATE:2,FERRITIN:2,TIBC:2,IRON:2,RETICCTPCT:2 in the last 72 hours  Micro Results: Recent Results (from the past 240 hour(s))  MRSA PCR SCREENING     Status: Normal   Collection Time   08/24/11  2:18 AM      Component Value Range Status Comment   MRSA by PCR NEGATIVE  NEGATIVE  Final   URINE CULTURE     Status: Normal   Collection Time   08/24/11  6:20 AM      Component Value Range Status Comment   Specimen Description URINE, CLEAN CATCH   Final    Special Requests NONE   Final    Culture  Setup Time [id number]   Final    Colony Count NO GROWTH   Final    Culture NO GROWTH   Final    Report Status 08/25/2011 FINAL   Final   CULTURE, BLOOD (ROUTINE X 2)     Status: Normal (Preliminary result)   Collection Time   08/24/11  6:50 AM  Component Value Range Status Comment   Specimen Description BLOOD RIGHT ARM   Final    Special Requests BOTTLES DRAWN AEROBIC AND ANAEROBIC 10CC EACH   Final    Culture  Setup Time [id number]   Final    Culture     Final    Value:        BLOOD CULTURE RECEIVED NO GROWTH TO DATE CULTURE WILL BE HELD FOR 5 DAYS BEFORE ISSUING A FINAL NEGATIVE REPORT   Report Status PENDING   Incomplete   CULTURE, BLOOD (ROUTINE X 2)     Status: Normal (Preliminary result)   Collection Time   08/24/11  6:55 AM      Component Value Range Status Comment   Specimen Description BLOOD RIGHT ARM   Final    Special Requests BOTTLES DRAWN AEROBIC ONLY 2CC   Final    Culture  Setup Time [id number]   Final    Culture     Final    Value:        BLOOD CULTURE RECEIVED NO GROWTH TO DATE CULTURE WILL BE HELD FOR 5 DAYS BEFORE ISSUING A FINAL NEGATIVE REPORT   Report Status PENDING   Incomplete      Studies/Results: No results found.  Medications: I have reviewed the patient's current medications.  Patient Active Hospital Problem List: Metabolic acidosis (08/24/2011) Improving on current regimen. Will plan on continuing current regimen. Was likely related to poorly controlled diabetes with a history of poor compliance and recent diamox which is currently being held. Will continue current regimen and add lantus  Hyperglycemia (08/24/2011) Will check hgba1c, add lantus, place consult for diabetic coordinator,  Elevated recently due to increase oral intake.  Will continue to monitor blood sugars  Dehydration (08/24/2011) Resolving with IVF rehydration. BUN Creatinine ration is decreasing   History of retinal detachment (08/24/2011) Pt is still on her lumigan, zymaxid, and lotemax. Stable will discuss with patient's ophthalmologist tomorrow.   Nausea & vomiting (08/24/2011) Resolving. Likely due to acidosis which is improving off the diamox, IVF's, and insulin.   Vitreous hemorrhage (08/24/2011) Stable   DKA (diabetic ketoacidoses) (08/24/2011) Was mild and has been improving with the insulin and IVF hydration. Will recheck BMP today and tomorrow in the AM. CO2 improved is 20 and Anion gap 7 based on morning labs. Will continue to monitor.  Will place consult to diabetes coordinator reason being would like to know if we can switch patient to lantus as outpatient so that she can have one agent to control her DM therefore making it more likely to have improved compliance.   UTI (lower urinary tract infection) (08/24/2011) Will order urine culture if not already done. Continue Rocephin may have contributed to uncontrolled blood sugars at home.   Leukocytosis (08/24/2011) Improved on Rocephin. Likely due to UTI   Hypokalemia (08/24/2011) Resolved currently will recheck BMP today and tomorrow in the AM.   HTN (hypertension) (08/24/2011) Will consider adding amlodipine if blood pressures stays  elevated tomorrow.      LOS: 3 days   Penny Pia M.D.  [facility] 08/26/2011, 6:13 PM

## 2011-08-27 LAB — GLUCOSE, CAPILLARY
Glucose-Capillary: 417 mg/dL — ABNORMAL HIGH (ref 70–99)
Glucose-Capillary: 437 mg/dL — ABNORMAL HIGH (ref 70–99)
Glucose-Capillary: 358 mg/dL — ABNORMAL HIGH (ref 70–99)

## 2011-08-27 LAB — CBC
Hemoglobin: 12.1 g/dL (ref 12.0–15.0)
Platelets: 189 10*3/uL (ref 150–400)
RBC: 4 MIL/uL (ref 3.87–5.11)
WBC: 9.1 10*3/uL (ref 4.0–10.5)

## 2011-08-27 LAB — BASIC METABOLIC PANEL
CO2: 16 mEq/L — ABNORMAL LOW (ref 19–32)
Chloride: 102 mEq/L (ref 96–112)
Glucose, Bld: 422 mg/dL — ABNORMAL HIGH (ref 70–99)
Sodium: 133 mEq/L — ABNORMAL LOW (ref 135–145)

## 2011-08-27 MED ORDER — INSULIN NPH (HUMAN) (ISOPHANE) 100 UNIT/ML ~~LOC~~ SUSP
20.0000 [IU] | Freq: Two times a day (BID) | SUBCUTANEOUS | Status: DC
  Administered 2011-08-27 – 2011-08-28 (×3): 20 [IU] via SUBCUTANEOUS
  Filled 2011-08-27: qty 10

## 2011-08-27 MED ORDER — PANTOPRAZOLE SODIUM 40 MG PO TBEC
40.0000 mg | DELAYED_RELEASE_TABLET | Freq: Every day | ORAL | Status: DC
  Administered 2011-08-27 – 2011-08-28 (×2): 40 mg via ORAL
  Filled 2011-08-27: qty 1

## 2011-08-27 NOTE — Progress Notes (Signed)
Subjective: Pt mentions that she feels better.  Has been able to eat more.  Has not felt nauseus or fatigued and is reportedly ambulating more.  At home she states that she controls her blood sugars with NPH 20 in the morning and 25 in the evening as well as regular insulin sliding scale.   Denies any abdominal discomfort.  Otherwise no other complaints.  No acute issues overnight.  Objective: Filed Vitals:   08/27/11 4098 08/27/11 0949 08/27/11 0952 08/27/11 1405  BP: 163/93 158/80 158/80 141/83  Pulse: 95 102  97  Temp: 97.9 F (36.6 C) 98.2 F (36.8 C)  97.9 F (36.6 C)  TempSrc: Oral Oral  Oral  Resp: 18 17  17   Height:      Weight:      SpO2: 98% 99%  100%   Weight change: 1.996 kg (4 lb 6.4 oz)  Intake/Output Summary (Last 24 hours) at 08/27/11 1454 Last data filed at 08/27/11 0629  Gross per 24 hour  Intake 3344.58 ml  Output      1 ml  Net 3343.58 ml    General: Alert, awake, oriented x3, in no acute distress.  HEENT: No bruits, no goiter.  Heart: Regular rate and rhythm, without murmurs, rubs, gallops.  Lungs: CTA BL  Abdomen: Soft, nontender, nondistended, positive bowel sounds.  Neuro: Grossly intact, nonfocal.   Lab Results:  Basename 08/27/11 0703 08/26/11 0600  NA 133* 139  K 4.9 4.3  CL 102 112  CO2 16* 20  GLUCOSE 422* 178*  BUN 11 9  CREATININE 0.58 0.57  CALCIUM 9.0 9.0  MG -- --  PHOS -- --    Basename 08/25/11 0610  AST 22  ALT 29  ALKPHOS 182*  BILITOT 0.5  PROT 5.4*  ALBUMIN 2.3*   No results found for this basename: LIPASE:2,AMYLASE:2 in the last 72 hours  Basename 08/27/11 0703 08/25/11 0610  WBC 9.1 9.7  NEUTROABS -- --  HGB 12.1 11.8*  HCT 34.4* 32.6*  MCV 86.0 85.1  PLT 189 234   No results found for this basename: CKTOTAL:3,CKMB:3,CKMBINDEX:3,TROPONINI:3 in the last 72 hours No components found with this basename: POCBNP:3 No results found for this basename: DDIMER:2 in the last 72 hours  Basename 08/26/11 1408    HGBA1C 11.0*   No results found for this basename: CHOL:2,HDL:2,LDLCALC:2,TRIG:2,CHOLHDL:2,LDLDIRECT:2 in the last 72 hours No results found for this basename: TSH,T4TOTAL,FREET3,T3FREE,THYROIDAB in the last 72 hours No results found for this basename: VITAMINB12:2,FOLATE:2,FERRITIN:2,TIBC:2,IRON:2,RETICCTPCT:2 in the last 72 hours  Micro Results: Recent Results (from the past 240 hour(s))  MRSA PCR SCREENING     Status: Normal   Collection Time   08/24/11  2:18 AM      Component Value Range Status Comment   MRSA by PCR NEGATIVE  NEGATIVE  Final   URINE CULTURE     Status: Normal   Collection Time   08/24/11  6:20 AM      Component Value Range Status Comment   Specimen Description URINE, CLEAN CATCH   Final    Special Requests NONE   Final    Culture  Setup Time [id number]   Final    Colony Count NO GROWTH   Final    Culture NO GROWTH   Final    Report Status 08/25/2011 FINAL   Final   CULTURE, BLOOD (ROUTINE X 2)     Status: Normal (Preliminary result)   Collection Time   08/24/11  6:50 AM  Component Value Range Status Comment   Specimen Description BLOOD RIGHT ARM   Final    Special Requests BOTTLES DRAWN AEROBIC AND ANAEROBIC 10CC EACH   Final    Culture  Setup Time [id number]   Final    Culture     Final    Value:        BLOOD CULTURE RECEIVED NO GROWTH TO DATE CULTURE WILL BE HELD FOR 5 DAYS BEFORE ISSUING A FINAL NEGATIVE REPORT   Report Status PENDING   Incomplete   CULTURE, BLOOD (ROUTINE X 2)     Status: Normal (Preliminary result)   Collection Time   08/24/11  6:55 AM      Component Value Range Status Comment   Specimen Description BLOOD RIGHT ARM   Final    Special Requests BOTTLES DRAWN AEROBIC ONLY 2CC   Final    Culture  Setup Time [id number]   Final    Culture     Final    Value:        BLOOD CULTURE RECEIVED NO GROWTH TO DATE CULTURE WILL BE HELD FOR 5 DAYS BEFORE ISSUING A FINAL NEGATIVE REPORT   Report Status PENDING   Incomplete      Studies/Results: No results found.  Medications: I have reviewed the patient's current medications.   Patient Active Hospital Problem List: Hyperglycemia (08/24/2011) At this point not well controled.  Tried to start patient on Lantus but suspect that not enough Units were used and thus patient's blood sugars have been elevated today due in part to her improved appetite as well.  Thus at this point will plan on placing patient on her home regimen of NPH and discontinuing Lantus today.  Blood sugars have been higher today.  Dehydration (08/24/2011) Resolved.  History of retinal detachment (08/24/2011) Pt is on her home regimen except for the diamox which we suspect may have contributed to patient's metabolic acidosis.  Nausea & vomiting (08/24/2011) Resolved.  Vitreous hemorrhage (08/24/2011) Stable  DKA (diabetic ketoacidoses) (08/24/2011) Mild currently suspect will improve once patient is on home regimen of NPH.  Will change today and have asked nurse to administer next dose.  Suspect that with cleared UTI infection and improved oral intake patient may have required more insulin.  Will adjust her medication as indicated above.  UTI (lower urinary tract infection) (08/24/2011) Non complicated UTI treated with Rocephin.  Today is day 4 will plan on discontinuing today.  HTN (hypertension) (08/24/2011) On diltiazem.  Would consider adding amlodipine should blood pressure stay elevated.  Reflux (08/26/2011) Stable on protonix po due to improved oral intake recently.     LOS: 4 days   Penny Pia M.D.  [facility] 08/27/2011, 2:54 PM

## 2011-08-27 NOTE — Progress Notes (Signed)
[id number]. nsg Pt. Refuses another piv insertion and iv fluids. Pt claims she will talk to MD regarding IV. Will text page md.

## 2011-08-27 NOTE — Progress Notes (Signed)
The patient is receiving protonix by the intravenous route.  Based on criteria approved by the Pharmacy and Therapeutics Committee and the Medical Executive Committee, the medication is being converted to the equivalent oral dose form.  These criteria include: -No Active GI bleeding -Able to tolerate diet of full liquids (or better) or tube feeding -Able to tolerate other medications by the oral or enteral route  If you have any questions about this conversion, please contact the Pharmacy Department (ext [phone]).  Thank you.  Brett Fairy, PharmD Pager: [phone]  08/27/2011 2:01 PM

## 2011-08-28 LAB — BASIC METABOLIC PANEL
BUN: 9 mg/dL (ref 6–23)
CO2: 26 mEq/L (ref 19–32)
Calcium: 9.4 mg/dL (ref 8.4–10.5)
Creatinine, Ser: 0.69 mg/dL (ref 0.50–1.10)
Glucose, Bld: 96 mg/dL (ref 70–99)

## 2011-08-28 NOTE — Discharge Summary (Signed)
Admit date: 08/23/2011 Discharge date: 08/28/2011  Primary Care Physician:  Irean Hong, MD, MD   Discharge Diagnoses:   Active Hospital Problems  Diagnoses Date Noted   . Hyperglycemia 08/24/2011   . Reflux 08/26/2011   . History of retinal detachment 08/24/2011   . Vitreous hemorrhage 08/24/2011   . DKA (diabetic ketoacidoses) 08/24/2011   . UTI (lower urinary tract infection) 08/24/2011   . HTN (hypertension) 08/24/2011     Resolved Hospital Problems  Diagnoses Date Noted Date Resolved  . Metabolic acidosis 08/24/2011 08/26/2011  . Dehydration 08/24/2011 08/27/2011  . Nausea & vomiting 08/24/2011 08/27/2011  . Leukocytosis 08/24/2011 08/26/2011  . Hypokalemia 08/24/2011 08/26/2011     DISCHARGE MEDICATION: Medication List  As of 08/28/2011  5:23 PM   STOP taking these medications         acetaZOLAMIDE 250 MG tablet         TAKE these medications         Besifloxacin HCl 0.6 % Susp   Place 1 drop into the right eye 3 (three) times daily.      bimatoprost 0.03 % ophthalmic solution   Commonly known as: LUMIGAN   Place 1 drop into the right eye at bedtime.      diltiazem 120 MG 24 hr capsule   Commonly known as: CARDIZEM CD   Take 1 capsule (120 mg total) by mouth daily.      insulin NPH 100 UNIT/ML injection   Commonly known as: HUMULIN N,NOVOLIN N   Inject 25 Units into the skin as directed.      insulin regular 100 units/mL injection   Commonly known as: NOVOLIN R,HUMULIN R   Inject 15 Units into the skin as directed.      lisinopril-hydrochlorothiazide 20-25 MG per tablet   Commonly known as: PRINZIDE,ZESTORETIC   Take 1 tablet by mouth daily. 1/2 tab daily      loteprednol 0.5 % ophthalmic suspension   Commonly known as: LOTEMAX   Place 1 drop into the right eye 4 (four) times daily.              Consults:     SIGNIFICANT DIAGNOSTIC STUDIES:  Dg Chest 2 View  08/23/2011  *RADIOLOGY REPORT*  Clinical Data: Weakness  CHEST - 2 VIEW   Comparison: None.  Findings: Lungs are clear. No pleural effusion or pneumothorax. The cardiomediastinal contours are within normal limits. Suggestion of osteopenia.  Otherwise, visualized bones and soft tissues are without significant appreciable abnormality.  Surgical clips right upper quadrant.  IMPRESSION: No acute cardiopulmonary process.  Original Report Authenticated By: Waneta Martins, M.D.     ECHO:N/A this admission     CARDIAC CATH & OTHER PROCEDURES:N/A  Recent Results (from the past 240 hour(s))  MRSA PCR SCREENING     Status: Normal   Collection Time   08/24/11  2:18 AM      Component Value Range Status Comment   MRSA by PCR NEGATIVE  NEGATIVE  Final   URINE CULTURE     Status: Normal   Collection Time   08/24/11  6:20 AM      Component Value Range Status Comment   Specimen Description URINE, CLEAN CATCH   Final    Special Requests NONE   Final    Culture  Setup Time [id number]   Final    Colony Count NO GROWTH   Final    Culture NO GROWTH   Final    Report Status 08/25/2011 FINAL  Final   CULTURE, BLOOD (ROUTINE X 2)     Status: Normal (Preliminary result)   Collection Time   08/24/11  6:50 AM      Component Value Range Status Comment   Specimen Description BLOOD RIGHT ARM   Final    Special Requests BOTTLES DRAWN AEROBIC AND ANAEROBIC 10CC EACH   Final    Culture  Setup Time [id number]   Final    Culture     Final    Value:        BLOOD CULTURE RECEIVED NO GROWTH TO DATE CULTURE WILL BE HELD FOR 5 DAYS BEFORE ISSUING A FINAL NEGATIVE REPORT   Report Status PENDING   Incomplete   CULTURE, BLOOD (ROUTINE X 2)     Status: Normal (Preliminary result)   Collection Time   08/24/11  6:55 AM      Component Value Range Status Comment   Specimen Description BLOOD RIGHT ARM   Final    Special Requests BOTTLES DRAWN AEROBIC ONLY 2CC   Final    Culture  Setup Time [id number]   Final    Culture     Final    Value:        BLOOD CULTURE RECEIVED NO GROWTH TO DATE CULTURE  WILL BE HELD FOR 5 DAYS BEFORE ISSUING A FINAL NEGATIVE REPORT   Report Status PENDING   Incomplete     BRIEF ADMITTING H & P:   Pt is a 46 y/o CF with history of DM, h/o DKA's, HTN, and recent history of retinal detachment with surgery ~ 5 days prior to presentation to the ED.  Presented secondary to diffuse fatigue, nausea, and vomiting.  Was admitted and treated for mild DKA which was thought to be multifactorial.  Pt mentioned that she had not been checking her blood sugars and had not been taking her medication as she should have.  As well as patient being on Diamox and UTI (patient had leukocytosis, cloudy urine with moderate leukocytes in urine).   She was treated with Rocephin for the UTI and with time WBC count normalized.   For her mild DKA we discontinued diamox, administered IV fluids, and provided insulin.  Patient improved on this regimen and was able to tolerate po intake well.    Discussed possibility of changing patient over to lantus with diabetic care coordinator but was indicated that patient is currently on the most affordable insulin regimen.  Thus patient is to go back home on her home regimen of insulin and I have discussed being more diligent about taking medication and checking her blood sugars frequently.  Pt verbalizes agreement and expresses understanding.  Active Hospital Problems  Diagnoses Date Noted   . Hyperglycemia 08/24/2011   . Reflux 08/26/2011   . History of retinal detachment 08/24/2011   . Vitreous hemorrhage 08/24/2011   . DKA (diabetic ketoacidoses) 08/24/2011   . UTI (lower urinary tract infection) 08/24/2011   . HTN (hypertension) 08/24/2011     Resolved Hospital Problems  Diagnoses Date Noted Date Resolved  . Metabolic acidosis 08/24/2011 08/26/2011  . Dehydration 08/24/2011 08/27/2011  . Nausea & vomiting 08/24/2011 08/27/2011  . Leukocytosis 08/24/2011 08/26/2011  . Hypokalemia 08/24/2011 08/26/2011     Disposition and Follow-up: AS  indicated below. Discharge Orders    Future Appointments: Provider: Department: Dept Phone: Center:   09/08/2011 11:00 AM Verne Carrow, MD [facility] [phone] [id number]     Future Orders Please Complete By Expires   Diet -  low sodium heart healthy      Increase activity slowly      Discharge instructions      Comments:   Patient is to follow up with her primary care physician in 1-2 weeks.  Also is to follow up with ophthalmologist in 1-2 weeks.   Call MD for:  temperature >100.4      Call MD for:  difficulty breathing, headache or visual disturbances      Call MD for:  persistant dizziness or light-headedness      Call MD for:  persistant nausea and vomiting      Driving Restrictions      Comments:   Would recommend not driving due to current condition.     Follow-up Information    Follow up with Irean Hong, MD .          DISCHARGE EXAM:  General: Alert, awake, oriented x3, in no acute distress. HEENT: atraumatic, normocephalic, neck supple, normal exterior ears and nose, subconjunctival hemorrhage of right eye, No bruits, no goiter. Heart: Regular rate and rhythm, without murmurs, rubs, gallops. Lungs: Clear to auscultation bilaterally. Abdomen: Soft, nontender, nondistended, positive bowel sounds. Extremities: No clubbing cyanosis or edema with positive pedal pulses. Neuro: Grossly intact, nonfocal. Skin: no diaphoresis, no rashes    Blood pressure 168/96, pulse 88, temperature 98.6 F (37 C), temperature source Oral, resp. rate 20, height 4\' 11"  (1.499 m), weight 56.3 kg (124 lb 1.9 oz), SpO2 100.00%.   Basename 08/28/11 1612 08/27/11 0703  NA 138 133*  K 3.9 4.9  CL 100 102  CO2 26 16*  GLUCOSE 96 422*  BUN 9 11  CREATININE 0.69 0.58  CALCIUM 9.4 9.0  MG -- --  PHOS -- --   No results found for this basename: AST:2,ALT:2,ALKPHOS:2,BILITOT:2,PROT:2,ALBUMIN:2 in the last 72 hours No results found for this basename:  LIPASE:2,AMYLASE:2 in the last 72 hours  Basename 08/27/11 0703  WBC 9.1  NEUTROABS --  HGB 12.1  HCT 34.4*  MCV 86.0  PLT 189    Signed: Penny Pia M.D. 08/28/2011, 5:23 PM

## 2011-08-28 NOTE — Progress Notes (Signed)
Received consult on this patient.  Questioning if we could put patient on Lantus alone as an outpatient to control her CBGs due to patient's lack of ability to afford insulin.  As an outpatient, patient was taking NPH and Regular insulin.  These 2 insulins are the cheapest insulins available on the market.  Lantus would be incredibly expensive for this patient (over $100 per vial).  Recommend we leave patient on her NPH and Regular b/c she can get these 2 insulins for $24.88 per vial through the [facility] pharmacy.  Noted NPH restarted last evening (08/27/11).  Recommend the following:  1. Change evening NPH dose to 25 units @ bedtime (QHS) 2. Continue Moderate SSI  When patient ready for d/c, please make sure to write insulin prescriptions for "Humulin Reli-on brand of NPH & Regular" insulin.  This brand of insulin is $24.88 per vial at the [facility] pharmacy. Patient can get both NPH and Regular for $24.88 per vial.    Will follow. Ambrose Finland RN, MSN, CDE Diabetes Coordinator Inpatient Diabetes Program [phone]

## 2011-08-29 NOTE — Progress Notes (Signed)
Utilization review complete 

## 2011-08-30 LAB — CULTURE, BLOOD (ROUTINE X 2)
Culture  Setup Time: 201302071013
Culture: NO GROWTH
Culture: NO GROWTH

## 2011-09-08 ENCOUNTER — Ambulatory Visit: Payer: Self-pay | Admitting: Cardiovascular Disease

## 2011-09-14 ENCOUNTER — Encounter: Payer: Self-pay | Admitting: Cardiovascular Disease

## 2011-09-14 ENCOUNTER — Ambulatory Visit (INDEPENDENT_AMBULATORY_CARE_PROVIDER_SITE_OTHER): Payer: Medicaid Other | Admitting: Cardiovascular Disease

## 2011-09-14 VITALS — BP 117/76 | HR 90 | Ht <= 58 in | Wt 120.0 lb

## 2011-09-14 DIAGNOSIS — I10 Essential (primary) hypertension: Secondary | ICD-10-CM

## 2011-09-14 DIAGNOSIS — I491 Atrial premature depolarization: Secondary | ICD-10-CM

## 2011-09-14 DIAGNOSIS — R002 Palpitations: Secondary | ICD-10-CM

## 2011-09-14 MED ORDER — LISINOPRIL-HYDROCHLOROTHIAZIDE 20-25 MG PO TABS: 1.0000 | ORAL_TABLET | Freq: Every day | ORAL | Status: DC

## 2011-09-14 MED ORDER — INSULIN NPH (HUMAN) (ISOPHANE) 100 UNIT/ML ~~LOC~~ SUSP: 25.0000 [IU] | SUBCUTANEOUS | Status: DC

## 2011-09-14 MED ORDER — INSULIN REGULAR HUMAN 100 UNIT/ML IJ SOLN: 15.0000 [IU] | INTRAMUSCULAR | Status: DC

## 2011-09-14 MED ORDER — DILTIAZEM HCL ER COATED BEADS 120 MG PO CP24: 120.0000 mg | ORAL_CAPSULE | Freq: Every day | ORAL | Status: DC

## 2011-09-14 NOTE — Progress Notes (Signed)
History of Present Illness: 46 yo female with history of DM, HTN, hyperlipidemia, Turner's syndrome who is here today for cardiac follow up. I saw her in 2012 as a new patient for evaluation of SOB and palpitations. She told me that she has a high heart rate at rest, with HR of 95-105 in am. She had noticed palpitations, feeling that her heart is racing. 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 No chest pain. She also noted dyspnea. These episodes occur 4-5 times per week. She has had no dizziness, near syncope or syncope. I arranged a 48 Hour Holter monitor and an echo. Her Holter monitor showed PACs. There was a lot of artifact on the monitor. NO SVT. Her echo was normal.   She is here today for follow up. She has had a recent detached retina in the right eye and she is now blind in that eye. No chest pain or SOB. No awareness of palpitations.   Primary Care Physician:  None. She needs insulin refilled.    Past Medical History  Diagnosis Date  . Dyspnea   . Tachycardia   . DM (diabetes mellitus)   . Hypertension   . Hyperlipidemia   . Turner's syndrome     Past Surgical History  Procedure Date  . Hepatic adenoma removed   . Tonsillectomy   . Retinal detachment surgery     Current Outpatient Prescriptions  Medication Sig Dispense Refill  . Besifloxacin HCl 0.6 % SUSP Place 1 drop into the right eye 3 (three) times daily.      . bimatoprost (LUMIGAN) 0.03 % ophthalmic solution Place 1 drop into the right eye at bedtime.      Marland Kitchen diltiazem (CARDIZEM CD) 120 MG 24 hr capsule Take 1 capsule (120 mg total) by mouth daily.  30 capsule  11  . insulin NPH (HUMULIN N,NOVOLIN N) 100 UNIT/ML injection Inject 25 Units into the skin as directed.       . insulin regular (HUMULIN R,NOVOLIN R) 100 UNIT/ML injection Inject 15 Units into the skin as directed.       Marland Kitchen lisinopril-hydrochlorothiazide (PRINZIDE,ZESTORETIC) 20-25 MG per tablet Take 1 tablet by mouth daily. 1/2 tab daily   30 tablet  3  . loteprednol (LOTEMAX) 0.5 % ophthalmic suspension Place 1 drop into the right eye 4 (four) times daily.        No Known Allergies  History   Social History  . Marital Status: Married    Spouse Name: N/A    Number of Children: N/A  . Years of Education: N/A   Occupational History  . Not on file.   Social History Main Topics  . Smoking status: Former Games developer  . Smokeless tobacco: Never Used   Comment: many years ago  . Alcohol Use: No  . Drug Use: No  . Sexually Active: Not on file   Other Topics Concern  . Not on file   Social History Narrative  . No narrative on file    No family history on file.  Review of Systems:  As stated in the HPI and otherwise negative.   BP 117/76  Pulse 90  Ht 4\' 9"  (1.448 m)  Wt 120 lb (54.432 kg)  BMI 25.97 kg/m2  Physical Examination: General: Well developed, well nourished, NAD HEENT: OP clear, mucus membranes moist SKIN: warm, dry. No rashes. Neuro: No focal deficits Musculoskeletal: Muscle strength 5/5 all ext Psychiatric: Mood and affect normal Neck: No JVD, no  carotid bruits, no thyromegaly, no lymphadenopathy. Lungs:Clear bilaterally, no wheezes, rhonci, crackles Cardiovascular: Regular rate and rhythm. No murmurs, gallops or rubs. Abdomen:Soft. Bowel sounds present. Non-tender.  Extremities: No lower extremity edema. Pulses are 2 + in the bilateral DP/PT.  EKG:

## 2011-09-14 NOTE — Assessment & Plan Note (Signed)
BP is well controlled 

## 2011-09-14 NOTE — Patient Instructions (Signed)
Your physician wants you to follow-up in:  12 months.  You will receive a reminder letter in the mail two months in advance. If you don't receive a letter, please call our office to schedule the follow-up appointment.   

## 2011-09-14 NOTE — Assessment & Plan Note (Signed)
She has not noticed any palpitations. She is tolerating her Cardizem well.

## 2011-09-25 ENCOUNTER — Telehealth: Payer: Self-pay | Admitting: Cardiovascular Disease

## 2011-09-25 NOTE — Telephone Encounter (Signed)
Please return call to patient at hm#  [phone]   Patient would like to discuss referral, she can be reached at hm# [phone]

## 2011-09-26 ENCOUNTER — Telehealth: Payer: Self-pay | Admitting: Cardiovascular Disease

## 2011-09-26 NOTE — Telephone Encounter (Signed)
Upon completing referral form it was determined pt will need referral from her primary care doctor to be seen by [facility] endocrinology. I called [facility] and confirmed this information with Sherri.  I called pt and told her she would need to contact her primary care provider for referral. She was previously seen by [facility] but states she is not going back and does not have primary provider.  Pt states she will contact [facility] and follow up on this.

## 2011-09-26 NOTE — Telephone Encounter (Signed)
Message left on pt's identified voice mail that referral has been made.

## 2011-09-26 NOTE — Telephone Encounter (Signed)
Spoke with pt. She saw Dr. Dagoberto Ligas in past at [place] for management of her diabetes but he has retired and she has not been seen in their office for awhile.  Needs referral to be seen by [facility].  I called and spoke with referral coordinator at [facility] and gave her patient's information. She will contact pt to schedule appt.  Will fax referral form.

## 2011-09-26 NOTE — Telephone Encounter (Signed)
Spoke with pt who is asking if  has an endocrinologist that could see her.  I told her that the  physicians who specialize in diabetes management require a referral from pt's primary care physician.  I encouraged her to contact [facility] for referral but she refuses.

## 2011-09-26 NOTE — Telephone Encounter (Signed)
Patient returning Nurse Dennie Bible Call, she can be reached at hm# [phone]

## 2011-11-15 ENCOUNTER — Telehealth: Payer: Self-pay | Admitting: Cardiovascular Disease

## 2011-11-15 NOTE — Telephone Encounter (Signed)
New msg Pt called and she has some questions about starting exercise and wants to start a diuretic. Please call

## 2011-11-15 NOTE — Telephone Encounter (Signed)
Patient states that she has starting to go back on her exercise routine, started with 10 minutes and increased to 30 minutes every time. Pt.  would like to know if she can take some kind of diuretic for a month or so  to help her to get read of some fluids and  lose a couple of lbs. Patient denies any swelling on upper or lower extremities, or  fluid retention in abdomen, nor SOB.

## 2011-11-16 NOTE — Telephone Encounter (Signed)
Spoke with pt and gave her information from Dr. Clifton James. Pt then asked if it was OK to take her husband's diuretic.  I told pt she should not take it and could have serious side effects if she did.

## 2011-11-16 NOTE — Telephone Encounter (Signed)
Can we let her know that we do not give diuretics for weight loss in the absence of symptoms such as lower extremity edema, pulmonary edema. If she needs a weight loss plan, I would advise that she follow up with primary care to discuss. Thanks, chris

## 2012-04-11 ENCOUNTER — Other Ambulatory Visit: Payer: Self-pay | Admitting: Cardiovascular Disease

## 2012-04-11 DIAGNOSIS — R002 Palpitations: Secondary | ICD-10-CM

## 2012-04-12 MED ORDER — DILTIAZEM HCL ER COATED BEADS 120 MG PO CP24: 120.0000 mg | ORAL_CAPSULE | Freq: Every day | ORAL | Status: DC

## 2012-04-12 MED ORDER — LISINOPRIL-HYDROCHLOROTHIAZIDE 20-25 MG PO TABS: 1.0000 | ORAL_TABLET | Freq: Every day | ORAL | Status: DC

## 2012-12-25 ENCOUNTER — Encounter: Payer: Medicare HMO | Attending: Family Medicine | Admitting: *Deleted

## 2012-12-25 NOTE — Progress Notes (Signed)
Insulin Pump Start Progress Note:  Patient appointment start time: 1600  End time 1830  Patient here for insulin pump start on Medtronic Revel pump and Quick Set infusion set Orders with pump settings received from MD Patient did not complete Pre- training as this is an Upgrade not a new start  Reviewed Pump Set Up including  Menu Settings  Bolus with Carb Ratio of 2 units per Exchange (= 1 unit / 8 grams Carb), Correction Factor of 1 unit / 30 mg/dl  Target Range of 161-096  Suspend  Basal with initial Basal Rate of 1.25 units/hour  Reservoir Set Up  Utilities Pump Training Checklist completed  Patient is signed up for [employer] and agrees to upload by 12/29/2012 for review of progress and allow for pump setting adjustments the following day.  Patient successfully completed pump start and instructed to call Jenita Seashore, RD, LDN, CDE if BG drops below 60 mg/dl or goes above 045 mg/dl or as directed by MD  Follow up plan: follow up visit scheduled for 2 weeks

## 2013-01-10 ENCOUNTER — Encounter: Payer: Medicare HMO | Admitting: *Deleted

## 2013-01-10 NOTE — Patient Instructions (Signed)
Plan: Consider entering all BG's into Bolus Wizard so you can give a bolus if it is high Consider using Capture Option to record additional info as needed. No changes to settings today, please upload in one week so we can review again.

## 2013-01-10 NOTE — Progress Notes (Signed)
  Pump Follow Up Progress Note Patient here for appointment. Start time: 1200 End time: 1300  Orders received from MD  giving me permission to make insulin pump adjustments for the following patient. Lim, Maykayla Female, 47 y.o., 06-Feb-1966  Reviewed blood glucose logs on 01/10/2013 via: [facility] and found the following:            Hypoglycemia Hyperglycemia Comments  Overnight Period:      Pre-Meal:    Breakfast      Lunch      Supper     Post-Meal: Breakfast      Lunch  YES was off pump   Supper  YES was off pump  Bedtime:   YES was off pump    Comments: Patient took pump off for several days and took Novolin 70/30 for most of those days due to busy schedule and not having time to change out the pump. The day she went back on pump, she did not take the 70/30 so her BG was over 400 mg/dl. She also does not always enter her carbs and BG into Bolus Wizard so she has missed some opportunities to give insulin for meals and to correct some high BGs. Yesterday she did better and her BGs were much better. Do not plan to make any pump setting changes today. Pt agrees to use Bolus Wizard more consistently going forward.  Pump Settings: Date: Current Date: 01/10/2013  No changes today   Basal Rate: Carb Ratio Sensitivity  Basal Rate: Carb Ratio Sensitivity   MN: 1.10 7.5 30 MN: 1.10 7.5 30                                                         Plan: Consider entering all BG's into Bolus Wizard so you can give a bolus if it is high Consider using Capture Option to record additional info as needed. No changes to settings today, please upload in one week so we can review again.   Follow up:  Patient to upload to [facility] within 7 days for further review

## 2013-02-26 ENCOUNTER — Other Ambulatory Visit: Payer: Self-pay | Admitting: Physical Medicine & Rehabilitation

## 2013-02-26 DIAGNOSIS — Z1231 Encounter for screening mammogram for malignant neoplasm of breast: Secondary | ICD-10-CM

## 2013-03-11 ENCOUNTER — Ambulatory Visit
Admission: RE | Admit: 2013-03-11 | Discharge: 2013-03-11 | Disposition: A | Discharge status: Home / Self Care | Payer: Medicare HMO | Source: Ambulatory Visit | Attending: Physical Medicine & Rehabilitation | Admitting: Physical Medicine & Rehabilitation

## 2013-03-11 DIAGNOSIS — Z1231 Encounter for screening mammogram for malignant neoplasm of breast: Secondary | ICD-10-CM

## 2013-04-21 ENCOUNTER — Encounter: Payer: Self-pay | Admitting: Internal Medicine

## 2013-04-22 ENCOUNTER — Telehealth: Payer: Self-pay

## 2013-04-22 NOTE — Telephone Encounter (Signed)
Spoke to patient and asked about past GI history , has been seen at [facility] office in [place] .  She has requested the records be sent to her and she will bring to her appointment.

## 2013-05-06 ENCOUNTER — Ambulatory Visit (INDEPENDENT_AMBULATORY_CARE_PROVIDER_SITE_OTHER): Payer: Medicare HMO | Admitting: Cardiovascular Disease

## 2013-05-06 ENCOUNTER — Encounter: Payer: Self-pay | Admitting: Cardiovascular Disease

## 2013-05-06 VITALS — BP 118/70 | HR 85 | Ht <= 58 in | Wt 133.0 lb

## 2013-05-06 DIAGNOSIS — R5383 Other fatigue: Secondary | ICD-10-CM

## 2013-05-06 DIAGNOSIS — R5381 Other malaise: Secondary | ICD-10-CM

## 2013-05-06 DIAGNOSIS — I10 Essential (primary) hypertension: Secondary | ICD-10-CM

## 2013-05-06 DIAGNOSIS — I491 Atrial premature depolarization: Secondary | ICD-10-CM

## 2013-05-06 NOTE — Patient Instructions (Signed)
Your physician wants you to follow-up in: ONE YEAR WITH DR Clifton James You will receive a reminder letter in the mail two months in advance. If you don't receive a letter, please call our office to schedule the follow-up appointment.

## 2013-05-06 NOTE — Progress Notes (Signed)
History of Present Illness: 47 yo female with history of DM, HTN, hyperlipidemia, Turner's syndrome who is here today for cardiac follow up. I saw her in 2012 as a new patient for evaluation of SOB and palpitations. She told me that she has a high heart rate at rest, with HR of 95-105 in am. She had noticed palpitations, feeling that her heart is racing. 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 No chest pain. She also noted dyspnea. These episodes occur 4-5 times per week. She has had no dizziness, near syncope or syncope. I arranged a 48 Hour Holter monitor and an echo. Her Holter monitor showed PACs. There was a lot of artifact on the monitor. NO SVT. Her echo was normal.   She is here today for follow up.  No chest pain or SOB. No awareness of palpitations. She does feel fatigued. No near syncope or syncope.  Recent hepatitis vaccine.   Primary Care Physician:  Payton Spark   Lipids: Followed in primary care.   Past Medical History  Diagnosis Date  . Dyspnea   . Tachycardia   . DM (diabetes mellitus)   . Hypertension   . Hyperlipidemia   . Turner's syndrome     Past Surgical History  Procedure Laterality Date  . Hepatic adenoma removed    . Tonsillectomy    . Retinal detachment surgery      Current Outpatient Prescriptions  Medication Sig Dispense Refill  . Atorvastatin Calcium (LIPITOR PO) Take by mouth. Pt unaware of dosage, will call us to update.      . insulin lispro (HUMALOG) 100 UNIT/ML injection Taking 30-40 units daily      . insulin regular (NOVOLIN R,HUMULIN R) 100 units/mL injection Inject 0.15 mLs (15 Units total) into the skin as directed.  10 mL  0  . lisinopril-hydrochlorothiazide (PRINZIDE,ZESTORETIC) 20-25 MG per tablet Take 1 tablet by mouth daily.  30 tablet  11  . diltiazem (CARDIZEM CD) 120 MG 24 hr capsule Take 1 capsule (120 mg total) by mouth daily.  30 capsule  11   No current facility-administered medications for this  visit.    No Known Allergies  History   Social History  . Marital Status: Married    Spouse Name: N/A    Number of Children: N/A  . Years of Education: N/A   Occupational History  . Not on file.   Social History Main Topics  . Smoking status: Former Games developer  . Smokeless tobacco: Never Used     Comment: many years ago  . Alcohol Use: No  . Drug Use: No  . Sexual Activity: Not on file   Other Topics Concern  . Not on file   Social History Narrative  . No narrative on file    No family history on file.  Review of Systems:  As stated in the HPI and otherwise negative.   BP 118/70  Pulse 85  Ht 4\' 9"  (1.448 m)  Wt 133 lb (60.328 kg)  BMI 28.77 kg/m2  Physical Examination: General: Well developed, well nourished, NAD HEENT: OP clear, mucus membranes moist SKIN: warm, dry. No rashes. Neuro: No focal deficits Musculoskeletal: Muscle strength 5/5 all ext Psychiatric: Mood and affect normal Neck: No JVD, no carotid bruits, no thyromegaly, no lymphadenopathy. Lungs:Clear bilaterally, no wheezes, rhonci, crackles Cardiovascular: Regular rate and rhythm. No murmurs, gallops or rubs. Abdomen:Soft. Bowel sounds present. Non-tender.  Extremities: No lower extremity edema. Pulses are 2 +  in the bilateral DP/PT.  EKG:NSR, rate 85 bpm.   Assessment and Plan:   1. PAC: Tolerating Cardizem. No palpitations.   2. HTN: BP is controlled.   3. Fatigue: could be related to her statin or Cardizem. She also has received the hepatitis vaccine. Could try holiday from lipitor for two weeks then Cardizem to see if related. BP not too low. HR is ok.

## 2013-05-21 ENCOUNTER — Ambulatory Visit: Payer: Medicare HMO | Attending: Ophthalmology | Admitting: Occupational Therapy

## 2013-05-21 DIAGNOSIS — H5410 Blindness, one eye, low vision other eye, unspecified eyes: Secondary | ICD-10-CM | POA: Insufficient documentation

## 2013-05-21 DIAGNOSIS — IMO0001 Reserved for inherently not codable concepts without codable children: Secondary | ICD-10-CM | POA: Insufficient documentation

## 2013-06-03 ENCOUNTER — Other Ambulatory Visit (INDEPENDENT_AMBULATORY_CARE_PROVIDER_SITE_OTHER): Payer: Medicare HMO

## 2013-06-03 ENCOUNTER — Ambulatory Visit (INDEPENDENT_AMBULATORY_CARE_PROVIDER_SITE_OTHER): Payer: Medicare HMO | Admitting: Internal Medicine

## 2013-06-03 ENCOUNTER — Encounter: Payer: Self-pay | Admitting: Internal Medicine

## 2013-06-03 VITALS — BP 122/80 | HR 100 | Ht <= 58 in | Wt 132.4 lb

## 2013-06-03 DIAGNOSIS — R748 Abnormal levels of other serum enzymes: Secondary | ICD-10-CM

## 2013-06-03 LAB — HEPATIC FUNCTION PANEL
ALT: 123 U/L — ABNORMAL HIGH (ref 0–35)
AST: 60 U/L — ABNORMAL HIGH (ref 0–37)
Albumin: 2.9 g/dL — ABNORMAL LOW (ref 3.5–5.2)
Alkaline Phosphatase: 299 U/L — ABNORMAL HIGH (ref 39–117)
Total Protein: 6.5 g/dL (ref 6.0–8.3)

## 2013-06-03 NOTE — Progress Notes (Signed)
Referred by: Elizabeth Palau, nurse practitioner  Subjective:    Patient ID: Tina [name], female    DOB: [birth year], 47 y.o.   MRN: [id number]  HPI The patient is here with her husband. She has a long history of abnormal liver chemistries including alkaline phosphatase and transaminases. There are extensive records here which I reviewed. She was previously seen at [facility] but requested a transfer because her insurance is not except that there. She doesn't have any symptoms associated with this at this time but says that her history goes back to 1999 or perhaps earlier. She had a hepatic adenoma removed at [facility] at that time. She said that they were concerned about bleeding he did not do a liver biopsy at that time but the she had the hepatic adenoma removed. I do not have those records. No Known Allergies Outpatient Prescriptions Prior to Visit  Medication Sig Dispense Refill  . Atorvastatin Calcium (LIPITOR PO) Take by mouth. Pt unaware of dosage, will call us to update.      . insulin lispro (HUMALOG) 100 UNIT/ML injection Taking 30-40 units daily      . insulin regular (NOVOLIN R,HUMULIN R) 100 units/mL injection Inject 0.15 mLs (15 Units total) into the skin as directed.  10 mL  0  . diltiazem (CARDIZEM CD) 120 MG 24 hr capsule Take 1 capsule (120 mg total) by mouth daily.  30 capsule  11  . lisinopril-hydrochlorothiazide (PRINZIDE,ZESTORETIC) 20-25 MG per tablet Take 1 tablet by mouth daily.  30 tablet  11   No facility-administered medications prior to visit.   Past Medical History  Diagnosis Date  . Dyspnea   . Tachycardia   . DM (diabetes mellitus), type 1   . Hypertension   . Hyperlipidemia   . Turner's syndrome   . Elevated LFTs   . Visual loss, bilateral     right eye light perception only and left eye 20/100  . Retinal detachment   . Diabetic retinopathy    Past Surgical History  Procedure Laterality Date  . Hepatic adenoma removed     . Tonsillectomy    . Retinal detachment surgery    . Cholecystectomy    . Cataract extraction     History   Social History  . Marital Status: Married    Spouse Name: N/A    Number of Children: 0  .     Occupational History  . Disabled    Social History Main Topics  . Smoking status: Former Games developer  . Smokeless tobacco: Never Used     Comment: many years ago  . Alcohol Use: No  . Drug Use: No  . Sexual Activity: None   Other Topics Concern  . None   Social History Narrative   Married, disabled due to visual problems. She is legally blind.   No children.   No caffeine reported.   Family History  Problem Relation Age of Onset  . Diabetes Maternal Grandmother         Review of Systems This is positive for those things mentiones in the HPI, also positive for hearing difficulty, chronic cough, skin rash thought to be eczema and joint pains.. All other review of systems are negative.      Objective:   Physical Exam General:  Well-developed, well-nourished and in no acute distress  + Turner syndrome habitus Eyes:  anicteric. ENT:   Mouth and posterior pharynx free of lesions.  Neck:   supple w/o thyromegaly or mass.  Lungs: Clear to auscultation bilaterally. Heart:  S1S2, no rubs, murmurs, gallops. Abdomen:  soft, non-tender, no hepatosplenomegaly, hernia, or mass and BS+.+ insulin pump Lymph:  no cervical or supraclavicular adenopathy. Extremities:   no edema Skin   no rash. + nevi Neuro:  A&O x 3.  Psych:  appropriate mood and  Affect.   Data Reviewed: Numerous outside record with GI w/u including serologies and CT scan Abd       Assessment & Plan:  Abnormal transaminases - Plan: Hepatic function panel  Abnormal alkaline phosphatase test

## 2013-06-03 NOTE — Patient Instructions (Signed)
Your physician has requested that you go to the basement for the following lab work before leaving today: LFT's  We will obtain your records for Dr. Leone Payor to review, if you have not heard from [place] by Dec. 15th please call us back.  It is the time of year to have a vaccination to prevent the flu (influenza virus).  Please have this done through your primary care provider or you can get this done at local pharmacies or the [facility]. It would be very helpful if you notify your primary care provider when and where you had the vaccination given by messaging them in My Chart, leaving a message or faxing the information.   I appreciate the opportunity to care for you.

## 2013-06-04 DIAGNOSIS — R748 Abnormal levels of other serum enzymes: Secondary | ICD-10-CM | POA: Insufficient documentation

## 2013-06-04 NOTE — Assessment & Plan Note (Signed)
Cause unclear - await review of [facility] pathology reports but suspect she does need a liver bx. Risks, benefits and indications/expectations discussed w/ patient and husband.  

## 2013-06-04 NOTE — Assessment & Plan Note (Signed)
Cause unclear - await review of [facility] pathology reports but suspect she does need a liver bx. Risks, benefits and indications/expectations discussed w/ patient and husband.

## 2013-06-06 NOTE — Progress Notes (Signed)
Quick Note:  Please send her a copy of LFT's after you call her with numbers Waiting on [facility] info but tell her I recommend liver biosy regardless due to persistent abnormal LFT's - ______

## 2013-06-18 ENCOUNTER — Encounter: Payer: Self-pay | Admitting: Internal Medicine

## 2013-06-18 NOTE — Progress Notes (Signed)
Quick Note:  No luck with old [facility] results Please let her know I really doubt that would change anything and I think she should have a liver biopsy ______

## 2013-07-29 ENCOUNTER — Encounter: Payer: Self-pay | Admitting: Internal Medicine

## 2013-09-18 ENCOUNTER — Encounter: Payer: Self-pay | Admitting: Pulmonary Disease

## 2013-09-18 ENCOUNTER — Ambulatory Visit (INDEPENDENT_AMBULATORY_CARE_PROVIDER_SITE_OTHER): Payer: Medicare HMO | Admitting: Pulmonary Disease

## 2013-09-18 VITALS — BP 134/66 | HR 82 | Temp 97.8°F | Ht <= 58 in | Wt 127.6 lb

## 2013-09-18 DIAGNOSIS — R05 Cough: Secondary | ICD-10-CM

## 2013-09-18 DIAGNOSIS — G4710 Hypersomnia, unspecified: Secondary | ICD-10-CM | POA: Insufficient documentation

## 2013-09-18 DIAGNOSIS — R053 Chronic cough: Secondary | ICD-10-CM | POA: Insufficient documentation

## 2013-09-18 DIAGNOSIS — R059 Cough, unspecified: Secondary | ICD-10-CM

## 2013-09-18 DIAGNOSIS — G4733 Obstructive sleep apnea (adult) (pediatric): Secondary | ICD-10-CM

## 2013-09-18 DIAGNOSIS — I10 Essential (primary) hypertension: Secondary | ICD-10-CM

## 2013-09-18 MED ORDER — OLMESARTAN MEDOXOMIL-HCTZ 20-12.5 MG PO TABS: 1.0000 | ORAL_TABLET | Freq: Every day | ORAL | Status: DC

## 2013-09-18 NOTE — Assessment & Plan Note (Signed)
STOP lisinopril Start Benicar - HCTZ 20-12.5 instead -sample x 2 weeks, check your BP  Lests reassess your cough next visit - is persistent then we'll undertake treatment trials  for GERD and upper airway cough

## 2013-09-18 NOTE — Progress Notes (Signed)
Subjective:    Patient ID: Tina [name], female    DOB: [birth year], 48 y.o.   MRN: [id number]  HPI Primary Care Physician: Fulton Mole    48 year old remote smoker with Turner syndrome presents for evaluation of sleep apnea and chronic cough. She also IDDM, HTN, hyperlipidemia, Turner's syndrome . She is legally blind due to retinal detachment in the right eye and cataract in the left. She also has impaired hearing. Cardiac evaluation did not show any defects. She reports a chronic cough for many years, has been on lisinopril for at least one year. Chest x-ray in 03/2013 was reported normal. Epworth sleepiness score is 7/24. She reports non-refreshing sleep in spite of a good quantity of sleep and states that she's not a morning person. She takes naps daily. Husband who has severe sleep apnea has noted loud snoring Bedtime is around 11 PM, sleep latency is about an hour, she sleeps on her side with 2 pillows, she is frequent awakenings including bathroom visits or due to her leg movements sometimes with long periods of awakening where she will watch TV or play games on her computer. She has a late wake up time ranging from 10 AM to noon. She's gained 10 pounds in the last 2 years She reports constant urge to move her legs and some calf pain  Past Medical History  Diagnosis Date  . Dyspnea   . Tachycardia   . DM (diabetes mellitus), type 1   . Hypertension   . Hyperlipidemia   . Turner's syndrome   . Elevated LFTs   . Visual loss, bilateral     right eye light perception only and left eye 20/100  . Retinal detachment   . Diabetic retinopathy     Past Surgical History  Procedure Laterality Date  . Hepatic adenoma removed    . Tonsillectomy    . Retinal detachment surgery    . Cholecystectomy    . Cataract extraction      No Known Allergies  History   Social History  . Marital Status: Married    Spouse Name: N/A    Number of Children: N/A  . Years of  Education: N/A   Occupational History  . Disabled    Social History Main Topics  . Smoking status: Former Smoker -- 0.20 packs/day for .5 years    Types: Cigarettes    Quit date: 07/17/1988  . Smokeless tobacco: Never Used     Comment: many years ago  . Alcohol Use: Yes     Comment: social  . Drug Use: No  . Sexual Activity: Not on file   Other Topics Concern  . Not on file   Social History Narrative   Married, disabled due to visual problems. She is legally blind.   No children.   No caffeine reported.    Family History  Problem Relation Age of Onset  . Diabetes Maternal Grandmother   . Emphysema Paternal Grandfather   . Cancer Father     kidney     Review of Systems  Constitutional: Negative for fever and unexpected weight change.  HENT: Positive for sneezing and sore throat. Negative for congestion, dental problem, ear pain, nosebleeds, postnasal drip, rhinorrhea, sinus pressure and trouble swallowing.   Eyes: Negative for redness and itching.  Respiratory: Positive for cough and shortness of breath. Negative for chest tightness and wheezing.   Cardiovascular: Negative for palpitations and leg swelling.  Gastrointestinal: Negative for nausea and vomiting.  Genitourinary: Negative  for dysuria.  Musculoskeletal: Negative for joint swelling.  Skin: Negative for rash.  Neurological: Positive for headaches.  Hematological: Does not bruise/bleed easily.  Psychiatric/Behavioral: Negative for dysphoric mood. The patient is not nervous/anxious.        Objective:   Physical Exam  Gen. Pleasant,  in no distress, normal affect ENT - no lesions, no post nasal drip, class 2-3 airway Neck: No JVD, no thyromegaly, no carotid bruits Lungs: no use of accessory muscles, no dullness to percussion, decreased without rales or rhonchi  Cardiovascular: Rhythm regular, heart sounds  normal, no murmurs or gallops, no peripheral edema Abdomen: soft and non-tender, no  hepatosplenomegaly, BS normal. Musculoskeletal: No deformities, no cyanosis or clubbing Neuro:  alert, non focal, no tremors        Assessment & Plan:

## 2013-09-18 NOTE — Patient Instructions (Signed)
Sleep study STOP lisinopril Start Benicar - HCTZ 20-12.5 instead -sample x 2 weeks, check your BP  Lests reassess your cough next visit

## 2013-09-18 NOTE — Assessment & Plan Note (Signed)
Given excessive daytime somnolence, narrow pharyngeal exam, witnessed apneas & loud snoring, obstructive sleep apnea is very likely & an overnight polysomnogram will be scheduled as a split study. The pathophysiology of obstructive sleep apnea , it's cardiovascular consequences & modes of treatment including CPAP were discused with the patient in detail & they evidenced understanding. We will also review the study for leg movements She does seem to have history that is clinically suggestive of restless leg syndrome

## 2013-09-30 ENCOUNTER — Ambulatory Visit (HOSPITAL_BASED_OUTPATIENT_CLINIC_OR_DEPARTMENT_OTHER): Payer: Medicare HMO | Attending: Pulmonary Disease | Admitting: Radiology

## 2013-09-30 DIAGNOSIS — G4733 Obstructive sleep apnea (adult) (pediatric): Secondary | ICD-10-CM

## 2013-09-30 DIAGNOSIS — R5381 Other malaise: Secondary | ICD-10-CM | POA: Insufficient documentation

## 2013-09-30 DIAGNOSIS — G479 Sleep disorder, unspecified: Secondary | ICD-10-CM | POA: Insufficient documentation

## 2013-09-30 DIAGNOSIS — R5383 Other fatigue: Principal | ICD-10-CM

## 2013-10-06 ENCOUNTER — Telehealth: Payer: Self-pay | Admitting: Pulmonary Disease

## 2013-10-06 NOTE — Telephone Encounter (Signed)
Called and spoke with pt. Gave her phone # to r/s appt over at the sleep lab. Nothing further needed

## 2013-10-09 ENCOUNTER — Telehealth: Payer: Self-pay | Admitting: Pulmonary Disease

## 2013-10-09 NOTE — Telephone Encounter (Signed)
Spoke with patient Pt is very frustrated with the process she is having to deal with with her sleep study and appt with Dr Elsworth Soho coming up. Pt had a sleep study scheduled and went to the appt and was sent home bc she could not go to sleep. She was told by the nurse at the sleep center that she needed to contact them back to reschedule the sleep study appt asap. Appt with Dr Elsworth Soho to review study and follow up on cough 10/20/13 Patients study was rescheduled to 10/21/13--which would be after her follow up visit with RA Pt wanting to resched appt with RA to after 10/21/13 so that he can have the results in hand to review. First available appt(when ALVA in office) 5.5.15>>Pt very frustrated with why its going to be a month after her study before she can see Dr Elsworth Soho.  I explained to her multiple times that its d/t Alva not being in the office and that its not d/t him not having availability. Pt offered appt with TP--refused. Only wants to see RA Pt scheduled with RA 11/18/13 to review sleep study. Pt advised to contact our office if she gets her sleep test moved to a sooner appt and we will try and move her back to 4.6.15 appt but for now will leave her appt in MAY. ------------------ Per [facility] Patient Instructions     Sleep study  STOP lisinopril  Start Benicar - HCTZ 20-12.5 instead -sample x 2 weeks, check your BP  Lests reassess your cough next visit   Pt states that Benicar is working well with BP and her cough is reducing.  Not resolved, but improving.  Dr Elsworth Soho please advise if okay to send Rx to pharmacy -- [facility] for her Benicar since it does seem to be reducing her cough and is keeping her BP controlled.

## 2013-10-10 NOTE — Telephone Encounter (Signed)
lmomtcb x1 for pt Pt can be seen at 2:45 on 10/22/13. This appt slot has been held for pt

## 2013-10-10 NOTE — Telephone Encounter (Signed)
Ok to work in.

## 2013-10-10 NOTE — Telephone Encounter (Signed)
Pt is still very mad. Would like appt back on 4/6. RA is overbooked.  Per pt- nurse would work her in.

## 2013-10-13 ENCOUNTER — Ambulatory Visit (HOSPITAL_BASED_OUTPATIENT_CLINIC_OR_DEPARTMENT_OTHER): Payer: Medicare HMO | Attending: Pulmonary Disease

## 2013-10-13 VITALS — Ht <= 58 in | Wt 127.0 lb

## 2013-10-13 DIAGNOSIS — G4733 Obstructive sleep apnea (adult) (pediatric): Secondary | ICD-10-CM | POA: Insufficient documentation

## 2013-10-13 NOTE — Telephone Encounter (Signed)
appt set and pt is aware.Jennifer Castillo, CMA  

## 2013-10-14 ENCOUNTER — Telehealth: Payer: Self-pay | Admitting: Pulmonary Disease

## 2013-10-14 DIAGNOSIS — G4733 Obstructive sleep apnea (adult) (pediatric): Secondary | ICD-10-CM

## 2013-10-14 NOTE — Telephone Encounter (Signed)
I spoke with patient about results and she verbalized understanding and had no questions 

## 2013-10-14 NOTE — Telephone Encounter (Signed)
PSG did not show sleep apnea Sleep quality was normal No significant leg movements noted

## 2013-10-14 NOTE — Sleep Study (Unsigned)
[place]   NAME: Deneice Vlachos  DATE OF BIRTH: 04-May-1966  MEDICAL RECORD [id number]  LOCATION: [facility]   PHYSICIAN: Kashif Pooler V.   DATE OF STUDY: 10/13/13   SLEEP STUDY TYPE: Nocturnal Polysomnogram   REFERRING PHYSICIAN: Rigoberto Noel, MD   INDICATION FOR STUDY:  48 year old remote smoker with Turner syndrome presents with non-refreshing sleep and excessive daytime fatigue. At the time of this study ,they weighed 127 pounds with a height of 4 ft 8 inches and the BMI of 28, neck size of 15 inches. Epworth sleepiness score was 3   This nocturnal polysomnogram was performed with a sleep technologist in attendance. EEG, EOG,EMG and respiratory parameters recorded. Sleep stages, arousals, limb movements and respiratory data was scored according to criteria laid out by the American Academy of sleep medicine.   SLEEP ARCHITECTURE: Lights out was at 2252 PM and lights on was at 503 AM. Total sleep time was 319 minutes with a sleep period time of 351 minutes and a sleep efficiency of 86 %. Sleep latency was 20 minutes with latency to REM sleep of 124 minutes and wake after sleep onset of 32 minutes. . Sleep stages as a percentage of total sleep time was N1 -1.6 %,N2- 90 % and REM sleep 8 % ( 26 minutes) . The longest period of REM sleep was around 1:30 AM.   AROUSAL DATA : There were 6  arousals with an arousal index of 1.1 events per hour. Most of these were spontaneous & 0 were associated with respiratory events  RESPIRATORY DATA: There were 1 obstructive apneas, 1 central apneas, 0 mixed apneas and 13 hypopneas with apnea -hypopnea index of 2.8 events per hour. There were 0 RERAs with an RDI of 2.8 events per hour. There was no relation to sleep stage or body position. Supine sleep was  noted  MOVEMENT/PARASOMNIA: There were 0 PLMS with a PLM index of 0 events per hour. The PLM arousal index was 0 per hour.  OXYGEN DATA: The lowest  desaturation was 84 % during nREM sleep and the desaturation index was 6 per hour.   CARDIAC DATA: The low heart rate was 55 beats per minute. The high heart rate recorded was an artifact. No arrhythmias were noted  DISCUSSION -mild snoring was noted . She did not meet criteria for CPAP intervention. She was desensitized with a  medium full face mask  IMPRESSION :  1. no evidence of sleep disordered breathing  2. No evidence of cardiac arrhythmias,periodic limb movements or behavioral disturbance during sleep.  3. Sleep efficiency was good  RECOMMENDATION:  1. no interventions 2. Patient should be cautioned against driving when sleepy  3. They should be asked to avoid medications with sedative side effects    Kashlynn Kundert V.  Diplomate, [employer] of [employer] Medicine    ELECTRONICALLY SIGNED ON: 10/14/2013  [facility]  PH: [phone] FX: [phone]) [place]

## 2013-10-20 ENCOUNTER — Ambulatory Visit: Payer: Medicare HMO | Admitting: Pulmonary Disease

## 2013-10-21 ENCOUNTER — Encounter (HOSPITAL_BASED_OUTPATIENT_CLINIC_OR_DEPARTMENT_OTHER): Payer: Medicare HMO

## 2013-10-22 ENCOUNTER — Encounter: Payer: Self-pay | Admitting: Pulmonary Disease

## 2013-10-22 ENCOUNTER — Ambulatory Visit (INDEPENDENT_AMBULATORY_CARE_PROVIDER_SITE_OTHER): Payer: Medicare HMO | Admitting: Pulmonary Disease

## 2013-10-22 VITALS — BP 122/72 | HR 87 | Temp 97.7°F | Wt 131.8 lb

## 2013-10-22 DIAGNOSIS — R053 Chronic cough: Secondary | ICD-10-CM

## 2013-10-22 DIAGNOSIS — G4733 Obstructive sleep apnea (adult) (pediatric): Secondary | ICD-10-CM

## 2013-10-22 DIAGNOSIS — I10 Essential (primary) hypertension: Secondary | ICD-10-CM

## 2013-10-22 DIAGNOSIS — R059 Cough, unspecified: Secondary | ICD-10-CM

## 2013-10-22 DIAGNOSIS — R05 Cough: Secondary | ICD-10-CM

## 2013-10-22 MED ORDER — OLMESARTAN MEDOXOMIL-HCTZ 20-12.5 MG PO TABS
0.5000 | ORAL_TABLET | Freq: Every day | ORAL | Status: DC
Start: 2013-10-22 — End: 2013-12-02

## 2013-10-22 NOTE — Patient Instructions (Signed)
Ok to take 1/2 tab of benicar/HCTZ 20/12.5 daily If cough returns, may have to treat reflux Sleep study did not show obstructive sleep apnea or leg movements Consider checking potassium & iron levels for leg cramps

## 2013-10-22 NOTE — Progress Notes (Signed)
   Subjective:    Patient ID: Tina [name], female    DOB: 1966-02-26, 48 y.o.   MRN: [id number]  HPI [facility] - sarah spencer  48 year old remote smoker with Turner syndrome presents for FU of chronic cough.  She also IDDM, HTN, hyperlipidemia, Turner's syndrome .  She is legally blind due to retinal detachment in the right eye and cataract in the left. She also has impaired hearing.  Cardiac evaluation did not show any defects.  She reports a chronic cough for many years, has been on lisinopril for at least one year. Chest x-ray in 03/2013 was reported normal.  Epworth sleepiness score is 7/24.  She reports non-refreshing sleep in spite of a good quantity of sleep and loud snoring  She reports constant urge to move her legs and some calf pain PSG did not show sleep disordered breathing or PLMs  Lisinopril was changed to benicar/HCTZ - BP dropped too low, but cough 80 % better  Chief Complaint  Patient presents with  . Follow-up    Pt is here to discuss sleep study results in detail.        Review of Systems neg for any significant sore throat, dysphagia, itching, sneezing, nasal congestion or excess/ purulent secretions, fever, chills, sweats, unintended wt loss, pleuritic or exertional cp, hempoptysis, orthopnea pnd or change in chronic leg swelling. Also denies presyncope, palpitations, heartburn, abdominal pain, nausea, vomiting, diarrhea or change in bowel or urinary habits, dysuria,hematuria, rash, arthralgias, visual complaints, headache, numbness weakness or ataxia.     Objective:   Physical Exam  Gen. Pleasant, well-nourished, in no distress ENT - no lesions, no post nasal drip Neck: No JVD, no thyromegaly, no carotid bruits Lungs: no use of accessory muscles, no dullness to percussion, clear without rales or rhonchi  Cardiovascular: Rhythm regular, heart sounds  normal, no murmurs or gallops, no peripheral edema Musculoskeletal: No deformities, no cyanosis or  clubbing       Assessment & Plan:

## 2013-10-23 NOTE — Assessment & Plan Note (Signed)
Ok to take 1/2 tab of benicar/HCTZ 20/12.5 daily If cough returns, may have to treat reflux

## 2013-10-23 NOTE — Assessment & Plan Note (Signed)
Sleep study did not show obstructive sleep apnea or leg movements Consider checking potassium & iron levels for leg cramps

## 2013-11-16 ENCOUNTER — Telehealth: Payer: Self-pay | Admitting: Internal Medicine

## 2013-11-16 NOTE — Telephone Encounter (Signed)
bp too high on half benicar, rec take a whole benicar and one of her husband's diuretics and see Tammy Np in am  If worse go to ER

## 2013-11-17 ENCOUNTER — Ambulatory Visit (INDEPENDENT_AMBULATORY_CARE_PROVIDER_SITE_OTHER): Payer: Medicare HMO | Admitting: Adult Health

## 2013-11-17 ENCOUNTER — Ambulatory Visit (INDEPENDENT_AMBULATORY_CARE_PROVIDER_SITE_OTHER)
Admission: RE | Admit: 2013-11-17 | Discharge: 2013-11-17 | Disposition: A | Discharge status: Home / Self Care | Payer: Medicare HMO | Source: Ambulatory Visit | Attending: Adult Health | Admitting: Adult Health

## 2013-11-17 ENCOUNTER — Encounter: Payer: Self-pay | Admitting: Adult Health

## 2013-11-17 VITALS — BP 146/56 | HR 78 | Temp 98.1°F | Ht <= 58 in | Wt 130.0 lb

## 2013-11-17 DIAGNOSIS — R059 Cough, unspecified: Secondary | ICD-10-CM

## 2013-11-17 DIAGNOSIS — E119 Type 2 diabetes mellitus without complications: Secondary | ICD-10-CM

## 2013-11-17 DIAGNOSIS — I10 Essential (primary) hypertension: Secondary | ICD-10-CM

## 2013-11-17 DIAGNOSIS — R053 Chronic cough: Secondary | ICD-10-CM

## 2013-11-17 DIAGNOSIS — R05 Cough: Secondary | ICD-10-CM

## 2013-11-17 DIAGNOSIS — Z794 Long term (current) use of insulin: Secondary | ICD-10-CM

## 2013-11-17 DIAGNOSIS — IMO0001 Reserved for inherently not codable concepts without codable children: Secondary | ICD-10-CM | POA: Insufficient documentation

## 2013-11-17 NOTE — Assessment & Plan Note (Signed)
IDDM w/ hypoglycemia today in office  Improved with intake  Advised to inform PCP/DM MD regarding low sugars.

## 2013-11-17 NOTE — Assessment & Plan Note (Signed)
Resolved off ACE inhibitor   Plan  Chest xray today .  Avoid Ace Inhibitors in future as they may make you cough .  follow up Dr. Elsworth Soho  As needed   Please contact office for sooner follow up if symptoms do not improve or worsen or seek emergency care

## 2013-11-17 NOTE — Assessment & Plan Note (Addendum)
Labile B/P  Cough resolved off ACE inhibitor -would avoid in future if possible  Will continue on Benicar and have her follow up with PCP for b/p management  Plan  Increase Benicar HCT 20/12.5mg  daily  Low salt diet  Follow up with Primary Doctor in 2 weeks for blood pressure follow up  Keep blood pressure log daily , and take log to office visit.  Avoid Ace Inhibitors in future as they may make you cough .  follow up Dr. Elsworth Soho  As needed   Please contact office for sooner follow up if symptoms do not improve or worsen or seek emergency care

## 2013-11-17 NOTE — Progress Notes (Signed)
   Subjective:    Patient ID: Tina [name], female    DOB: 11/27/1965, 48 y.o.   MRN: [id number]  HPI [facility] - sarah spencer 48 year old remote smoker with Turner syndrome presents for FU of chronic cough.  She also IDDM, HTN, hyperlipidemia, Turner's syndrome .  She is legally blind due to retinal detachment in the right eye and cataract in the left. She also has impaired hearing.  Cardiac evaluation did not show any defects.  She reports a chronic cough for many years, has been on lisinopril for at least one year. Chest x-ray in 03/2013 was reported normal.  Epworth sleepiness score is 7/24.  She reports non-refreshing sleep in spite of a good quantity of sleep and loud snoring  She reports constant urge to move her legs and some calf pain PSG did not show sleep disordered breathing or PLMs  Lisinopril was changed to benicar/HCTZ - BP dropped too low, but cough 80 % better  11/17/2013 Acute OV  Complains pf  B/p has been up and down.  Was taken off Lisinopril 2 months ago d/t cough . Changed to Benicar. Benicar HCT was decreased last ov 1 month ago d/t low b/p .  Over last 4 weeks b/p up and down .  B/p last night  was high at 177/89 . She was instructed to increase to whole tab.  B/p today is 122/70 (recheck)  Of note cough resolved off Ace Inhibitor.  No chest pain, orthopnea, edema or n/v/d.   Did feel bad on arrival to office w/ weakness, pt is IDDM. BS was checked and very low at 59. She was given something to drink/eat w/ recheck at 85. Advised to alert DM MD to low sugars if persist.          Chief Complaint  Patient presents with  . Acute Visit    RA pt here for increased SOB yesterday and HTN on the Benicar (177/89 yesterday).  reports weakness, dizziness today.         Review of Systems  neg for any significant sore throat, dysphagia, itching, sneezing, nasal congestion or excess/ purulent secretions, fever, chills, sweats, unintended wt loss, pleuritic or  exertional cp, hempoptysis, orthopnea pnd or change in chronic leg swelling. Also denies presyncope, palpitations, heartburn, abdominal pain, nausea, vomiting, diarrhea or change in bowel or urinary habits, dysuria,hematuria, rash, arthralgias,  headache, numbness weakness or ataxia.     Objective:   Physical Exam   Gen. Pleasant, well-nourished, in no distress ENT - no lesions, no post nasal drip, right eye blindness  Neck: No JVD, no thyromegaly, no carotid bruits Lungs: no use of accessory muscles, no dullness to percussion, clear without rales or rhonchi  Cardiovascular: Rhythm regular, heart sounds  normal, no murmurs or gallops, no peripheral edema Musculoskeletal: No deformities, no cyanosis or clubbing   BS 59  Recheck BS [id number]      Assessment & Plan:

## 2013-11-17 NOTE — Patient Instructions (Signed)
Increase Benicar HCT 20/12.5mg  daily  Low salt diet  Follow up with Primary Doctor in 2 weeks for blood pressure follow up  Keep blood pressure log daily , and take log to office visit.  Watch blood sugar closely and report to your Diabetic Doctor .  Chest xray today .  Avoid Ace Inhibitors in future as they may make you cough .  follow up Dr. Elsworth Soho  As needed   Please contact office for sooner follow up if symptoms do not improve or worsen or seek emergency care

## 2013-11-18 ENCOUNTER — Ambulatory Visit: Payer: Medicare HMO | Admitting: Pulmonary Disease

## 2013-11-20 NOTE — Progress Notes (Signed)
Reviewed & agree with plan  

## 2013-11-20 NOTE — Progress Notes (Signed)
Quick Note:  Called spoke with patient, advised of cxr results / recs as stated by TP. Pt verbalized her understanding and denied any questions. ______ 

## 2013-11-30 ENCOUNTER — Encounter (HOSPITAL_COMMUNITY): Payer: Self-pay | Admitting: Emergency Medicine

## 2013-11-30 ENCOUNTER — Emergency Department (HOSPITAL_COMMUNITY)
Admission: EM | Admit: 2013-11-30 | Discharge: 2013-12-01 | Disposition: A | Discharge status: Home / Self Care | Payer: No Typology Code available for payment source | Attending: Emergency Medicine | Admitting: Emergency Medicine

## 2013-11-30 ENCOUNTER — Emergency Department (HOSPITAL_COMMUNITY): Payer: No Typology Code available for payment source

## 2013-11-30 DIAGNOSIS — Z794 Long term (current) use of insulin: Secondary | ICD-10-CM | POA: Diagnosis not present

## 2013-11-30 DIAGNOSIS — Q969 Turner's syndrome, unspecified: Secondary | ICD-10-CM | POA: Diagnosis not present

## 2013-11-30 DIAGNOSIS — Y9389 Activity, other specified: Secondary | ICD-10-CM | POA: Diagnosis not present

## 2013-11-30 DIAGNOSIS — E11319 Type 2 diabetes mellitus with unspecified diabetic retinopathy without macular edema: Secondary | ICD-10-CM | POA: Insufficient documentation

## 2013-11-30 DIAGNOSIS — Z87891 Personal history of nicotine dependence: Secondary | ICD-10-CM | POA: Insufficient documentation

## 2013-11-30 DIAGNOSIS — Z79899 Other long term (current) drug therapy: Secondary | ICD-10-CM | POA: Insufficient documentation

## 2013-11-30 DIAGNOSIS — IMO0002 Reserved for concepts with insufficient information to code with codable children: Secondary | ICD-10-CM | POA: Insufficient documentation

## 2013-11-30 DIAGNOSIS — S46909A Unspecified injury of unspecified muscle, fascia and tendon at shoulder and upper arm level, unspecified arm, initial encounter: Secondary | ICD-10-CM | POA: Insufficient documentation

## 2013-11-30 DIAGNOSIS — S99919A Unspecified injury of unspecified ankle, initial encounter: Secondary | ICD-10-CM | POA: Diagnosis present

## 2013-11-30 DIAGNOSIS — S298XXA Other specified injuries of thorax, initial encounter: Secondary | ICD-10-CM | POA: Insufficient documentation

## 2013-11-30 DIAGNOSIS — Y9289 Other specified places as the place of occurrence of the external cause: Secondary | ICD-10-CM | POA: Diagnosis not present

## 2013-11-30 DIAGNOSIS — S4980XA Other specified injuries of shoulder and upper arm, unspecified arm, initial encounter: Secondary | ICD-10-CM | POA: Insufficient documentation

## 2013-11-30 DIAGNOSIS — I10 Essential (primary) hypertension: Secondary | ICD-10-CM | POA: Diagnosis not present

## 2013-11-30 DIAGNOSIS — E1039 Type 1 diabetes mellitus with other diabetic ophthalmic complication: Secondary | ICD-10-CM | POA: Insufficient documentation

## 2013-11-30 DIAGNOSIS — T148XXA Other injury of unspecified body region, initial encounter: Secondary | ICD-10-CM

## 2013-11-30 DIAGNOSIS — M549 Dorsalgia, unspecified: Secondary | ICD-10-CM

## 2013-11-30 DIAGNOSIS — E785 Hyperlipidemia, unspecified: Secondary | ICD-10-CM | POA: Diagnosis not present

## 2013-11-30 DIAGNOSIS — S8990XA Unspecified injury of unspecified lower leg, initial encounter: Secondary | ICD-10-CM | POA: Diagnosis present

## 2013-11-30 MED ORDER — HYDROCODONE-ACETAMINOPHEN 5-325 MG PO TABS
2.0000 | ORAL_TABLET | Freq: Once | ORAL | Status: AC
  Administered 2013-11-30: 2 via ORAL
  Filled 2013-11-30: qty 2

## 2013-11-30 NOTE — ED Notes (Signed)
Patient transported to X-ray 
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
## 2013-12-01 ENCOUNTER — Encounter: Payer: Medicare HMO | Attending: Physician Assistant | Admitting: *Deleted

## 2013-12-01 DIAGNOSIS — Z713 Dietary counseling and surveillance: Secondary | ICD-10-CM | POA: Insufficient documentation

## 2013-12-01 DIAGNOSIS — Z794 Long term (current) use of insulin: Secondary | ICD-10-CM | POA: Insufficient documentation

## 2013-12-01 DIAGNOSIS — E119 Type 2 diabetes mellitus without complications: Secondary | ICD-10-CM | POA: Insufficient documentation

## 2013-12-01 DIAGNOSIS — IMO0001 Reserved for inherently not codable concepts without codable children: Secondary | ICD-10-CM

## 2013-12-01 DIAGNOSIS — IMO0002 Reserved for concepts with insufficient information to code with codable children: Secondary | ICD-10-CM | POA: Diagnosis not present

## 2013-12-01 LAB — CBG MONITORING, ED
GLUCOSE-CAPILLARY: 45 mg/dL — AB (ref 70–99)
GLUCOSE-CAPILLARY: 51 mg/dL — AB (ref 70–99)

## 2013-12-01 MED ORDER — OXYCODONE-ACETAMINOPHEN 5-325 MG PO TABS: 1.0000 | ORAL_TABLET | ORAL | Status: DC | PRN

## 2013-12-01 NOTE — ED Notes (Addendum)
Pt given more orange juice at time of D/C. Pt going to get food with husband at this time. Pt alert x4 and ambulatory at time of d/c.
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
## 2013-12-01 NOTE — ED Notes (Signed)
Pt given orange juice and graham crackers

## 2013-12-02 ENCOUNTER — Emergency Department (HOSPITAL_COMMUNITY)
Admission: EM | Admit: 2013-12-02 | Discharge: 2013-12-03 | Disposition: A | Discharge status: Home / Self Care | Payer: No Typology Code available for payment source | Attending: Emergency Medicine | Admitting: Emergency Medicine

## 2013-12-02 ENCOUNTER — Encounter (HOSPITAL_COMMUNITY): Payer: Self-pay | Admitting: Emergency Medicine

## 2013-12-02 DIAGNOSIS — E1139 Type 2 diabetes mellitus with other diabetic ophthalmic complication: Secondary | ICD-10-CM | POA: Insufficient documentation

## 2013-12-02 DIAGNOSIS — E785 Hyperlipidemia, unspecified: Secondary | ICD-10-CM | POA: Insufficient documentation

## 2013-12-02 DIAGNOSIS — I10 Essential (primary) hypertension: Secondary | ICD-10-CM | POA: Insufficient documentation

## 2013-12-02 DIAGNOSIS — Z794 Long term (current) use of insulin: Secondary | ICD-10-CM | POA: Diagnosis not present

## 2013-12-02 DIAGNOSIS — Z79899 Other long term (current) drug therapy: Secondary | ICD-10-CM | POA: Insufficient documentation

## 2013-12-02 DIAGNOSIS — H2101 Hyphema, right eye: Secondary | ICD-10-CM

## 2013-12-02 DIAGNOSIS — H2100 Hyphema, unspecified eye: Secondary | ICD-10-CM | POA: Diagnosis not present

## 2013-12-02 DIAGNOSIS — Z87891 Personal history of nicotine dependence: Secondary | ICD-10-CM | POA: Insufficient documentation

## 2013-12-02 DIAGNOSIS — E11319 Type 2 diabetes mellitus with unspecified diabetic retinopathy without macular edema: Secondary | ICD-10-CM | POA: Diagnosis not present

## 2013-12-02 DIAGNOSIS — H5710 Ocular pain, unspecified eye: Secondary | ICD-10-CM | POA: Diagnosis present

## 2013-12-02 NOTE — ED Notes (Signed)
Pt. reports right eye pain , itchy with redness and  mild drainage onset today .
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
## 2013-12-02 NOTE — ED Notes (Signed)
PA at bedside.

## 2013-12-03 DIAGNOSIS — H2100 Hyphema, unspecified eye: Secondary | ICD-10-CM | POA: Diagnosis not present

## 2013-12-03 MED ORDER — OXYCODONE-ACETAMINOPHEN 5-325 MG PO TABS
1.0000 | ORAL_TABLET | Freq: Once | ORAL | Status: DC
  Filled 2013-12-03: qty 1

## 2013-12-03 MED ORDER — OXYCODONE-ACETAMINOPHEN 5-325 MG PO TABS
1.0000 | ORAL_TABLET | Freq: Once | ORAL | Status: AC
  Administered 2013-12-03: 1 via ORAL
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
## 2013-12-03 NOTE — Discharge Instructions (Signed)
IT IS RECOMMENDED THAT YOU SLEEP WITH YOUR HEAD ELEVATED TONIGHT. AVOID ANY MEDICATIONS THAT ACT AS A BLOOD THINNER - ASPIRIN, IBUPROFEN. IT IS IMPORTANT THAT YOU ARE SEEN TOMORROW FOR FURTHER EVALUATION AND TREATMENT.   Hyphema A hyphema is bleeding in the front chamber of the eye, inside the eye itself, between the cornea (clear outer covering of the eye) and the iris (colored part of the eye). It may occur from any form of injury to the eye. It may also occur on its own (spontaneously) in certain medical conditions. Because of gravity, the blood generally settles to the lower part of the eye. This creates a clear red area, with a "fluid level" or straight top, which is clearly visible when looking at the eye. Very small hyphemas may only be visible to an eye specialist, when doing an examination with special tools. Very large hyphemas may completely fill the front chamber, so that the colored part of the eye cannot be seen at all. This is often called an "8 Ball" or "Grade 4" hyphema. It is a dangerous and often painful condition, that must be treated immediately. CAUSES  The most common cause of a hyphema is injury (trauma). In some cases, even a very mild blow to the eye can result in a hyphema. Any condition that makes a patient more likely to bleed can also cause a hyphema. Examples include:  Diseases that prevent normal blood clotting (hemophilia, blood disorders, low platelet count).  Use of anti-coagulant drugs or blood thinners (Heparin, Coumadin).  Overuse of aspirin, or similar medicines.  Diabetes.  Recent eye surgery. SYMPTOMS   A very small (microscopic) hyphema may cause no symptoms at all, or it may cause slightly blurred vision in the affected eye.  A hyphema with a fluid level usually causes blurred vision in the affected eye.  An "8 Ball" hyphema causes severe or total loss of vision in the affected eye, and may be very painful. RISKS AND COMPLICATIONS  The fluid  normally present in the front part of the eye is constantly produced and drained from the inside of the eye, by an internal drainage system. When a hyphema occurs, the blood clogs up this drainage system. This may cause a build-up of fluid, resulting in increased pressure inside the eye. This condition is a form of glaucoma (eye disease that involves pressure inside the eyeball).  "8 Ball" hyphemas may clog up the drainage system completely, causing a sharp (acute) and sudden rise in the pressure inside the eye. This is a dangerous and painful condition. "8 Ball" hyphemas must be treated as soon as possible. The longer they are present, the greater the danger of permanent damage and vision loss.  It is important to know that every hyphema has the potential to "re-bleed" and become an "8 Ball" hyphema. The greatest danger of this happening is between one and two weeks after the hyphema first occurred.  Chronic, recurring hyphemas can cause scarring inside the eye, which may cause further complications. TREATMENT  Most hyphemas that are not "8 Ball" hyphemas clear up fully on their own. Depending on the amount of blood, it may take several days to a few weeks to clear, with a gradual return of normal vision.  The treatment for these types of hyphemas include:  Restricted activity or bed rest.  Stopping all medicines that can increase bleeding (aspirin, blood thinners).  Drops to enlarge (dilate) the pupil of the injured eye (to prevent internal scarring).  Close monitoring  by your eye specialist, until the hyphema has completely cleared. This is to make sure eye pressure does not increase. Medicine to prevent or treat increased eye pressure may be needed. The treatment of "8 Ball" hyphemas includes:  Surgery to remove the blood from the front part of the eye.  Medicine (drops or pills) to control the pressure in the eye.  A small opening may be made in the iris (colored part) of the eye, to  make sure the blood can drain out and that pressure in the eye does not become dangerously high. HOME CARE INSTRUCTIONS   Follow your caregiver's instructions, otherwise more bleeding may occur. This could result in permanent loss of vision.  Rest in bed as much as possible, for as long as directed by your caregiver. Lie on your back, and use extra pillows to keep your head raised. You may go the bathroom, eat, and bathe while you are up.  Only take over-the-counter or prescription medicines for pain, discomfort, or fever as directed by your caregiver.  If you have an eye shield, remove it only to put in your prescribed eye drops, and then replace it over your eye. Do this for as long as directed by your caregiver. This is to help protect your eye from further injury and a possible re-bleed.  Do not bend forward or lower your head until the hyphema clears up. Do not do lifting or strenuous activities until the hyphema completely clears up, or as directed by your caregiver. SEEK IMMEDIATE MEDICAL CARE IF:   Your vision changes in any way, or you develop pain in the affected eye.  You are unable to see the colored part of your eye, when you could see all or part of it before.  You feel sick to your stomach (nauseous) or start to vomit. MAKE SURE YOU:   Understand these instructions.  Will watch your condition.  Will get help right away if you are not doing well or get worse. Always wear eye protection when involved in any sports or work-related activities that could result in an injury to your eyes. Document Released: 10/09/2000 Document Revised: 09/25/2011 Document Reviewed: 05/13/2009 [facility] Patient Information 2014 [place].
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
## 2013-12-09 NOTE — Patient Instructions (Signed)
Plan: Continue entering all BG's into Bolus Wizard so you can give a bolus if it is high Continue using Capture Option to record additional info as needed. Consider using Dual Wave for higher fat meals as we discussed today No changes to settings today.

## 2013-12-09 NOTE — Progress Notes (Signed)
  Pump Follow Up Progress Note Patient here for appointment. Start time: 1700 End time: 1730  Orders received from MD  giving me permission to make insulin pump adjustments for the following patient. [name], Tina Female, 48 y.o., 1965-07-24  Reviewed blood glucose logs on 12/01/2013 via: [facility] and found the following:              Hypoglycemia Hyperglycemia Comments  Overnight Period:  YES YES  EXCURSION  Pre-Meal:    Breakfast YES     Lunch      Supper  YES   Post-Meal: Breakfast      Lunch      Supper YES    Bedtime:   YES    Comments: Average BG over last 2 weeks is 146 +/- 107 mg/dl, indicating a great deal of variability including a lot of hypoglycemia along with several excursions above 400 mg.dl.. She states the high BGs are in conjunction with higher fat meals which causes extended time of elevations. She prefers to have her BG's lower and does not complain of the hypoglycemia she is having. She requests a change in pump settings for the rise in her BG that she observes mid morning but I explained that there is a much larger incidence of hypoglycemia and I cannot recommend a rise in her Basal Rates until those are resolved. We discussed the use of  Dual Wave for the high fat meals and she plans to initiate use of that for now.   Pump Settings: Date: Current Date: 11/1583/2015  No changes today   Basal Rate: Carb Ratio Sensitivity  Basal Rate: Carb Ratio Sensitivity   MN: 1.45 7.3 30 MN: 1.45 7.3 30  4  A: 1.00   4 A 1.00    10 A 1.30   10 A 1.30    2 P 1.45   2 P 1.45                                Plan: Continue entering all BG's into Bolus Wizard so you can give a bolus if it is high Continue using Capture Option to record additional info as needed. Consider using Dual Wave for higher fat meals as we discussed today No changes to settings today.   Follow up:  Patient to upload to [facility] as needed and notify me so I can see the reports PRN

## 2014-01-05 NOTE — ED Provider Notes (Signed)
Medical screening examination/treatment/procedure(s) were performed by non-physician practitioner and as supervising physician Dr. Marnette Burgess was immediately available for consultation/collaboration.   EKG Interpretation None       Babette Relic, MD 01/05/14 [phone]

## 2014-01-12 ENCOUNTER — Other Ambulatory Visit: Payer: Self-pay | Admitting: Ophthalmology

## 2014-01-12 ENCOUNTER — Encounter: Payer: Medicare HMO | Attending: Physician Assistant | Admitting: *Deleted

## 2014-01-12 DIAGNOSIS — Z713 Dietary counseling and surveillance: Secondary | ICD-10-CM | POA: Insufficient documentation

## 2014-01-12 DIAGNOSIS — Z794 Long term (current) use of insulin: Secondary | ICD-10-CM | POA: Insufficient documentation

## 2014-01-12 DIAGNOSIS — E119 Type 2 diabetes mellitus without complications: Secondary | ICD-10-CM | POA: Insufficient documentation

## 2014-01-12 DIAGNOSIS — IMO0001 Reserved for inherently not codable concepts without codable children: Secondary | ICD-10-CM

## 2014-01-12 NOTE — Progress Notes (Signed)
  Pump Follow Up Progress Note Patient here for appointment on 01/12/2014. Start time: 1700 End time: 1730  Orders received from MD  giving me permission to make insulin pump adjustments for the following patient. 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 She has been in extreme pain for several weeks and is anxious about having this surgery done. She has complained about a rise in her BG without any food intake in the early morning and we raised her Basal Rate a couple of weeks ago, but it is still occurring . I also see hypoglycemia in the late afternoon for 7 out of the last 14 days. Plan to adjust her Basal Rates per below.   Pump Settings: Date: Current Date: 01/12/2014  No changes today   Basal Rate: Carb Ratio Sensitivity  Basal Rate: Carb Ratio Sensitivity   MN: 1.45 7.3 30 MN: 1.45 7.3 30  4  A: 1.00   4 A 1.00    10 A 1.45   10 A 1.60  (+)    2 P 1.45   2 P 1.35  (-)                                Plan: Continue entering all BG's into Bolus Wizard so you can give a bolus if it is high Continue using Capture Option to record additional info as needed. Consider use of Temp Basal @ 120% if BG are elevated for the first couple of days after surgery.  Follow up:  Patient to upload to [facility] as needed and notify me so I can see the reports PRN

## 2014-03-09 ENCOUNTER — Encounter: Payer: Medicare HMO | Attending: Physician Assistant | Admitting: *Deleted

## 2014-03-09 DIAGNOSIS — Z794 Long term (current) use of insulin: Secondary | ICD-10-CM | POA: Diagnosis not present

## 2014-03-09 DIAGNOSIS — Z713 Dietary counseling and surveillance: Secondary | ICD-10-CM | POA: Diagnosis not present

## 2014-03-09 DIAGNOSIS — E119 Type 2 diabetes mellitus without complications: Secondary | ICD-10-CM | POA: Diagnosis not present

## 2014-03-09 DIAGNOSIS — IMO0001 Reserved for inherently not codable concepts without codable children: Secondary | ICD-10-CM

## 2014-03-09 NOTE — Patient Instructions (Signed)
Plan: Double check the carb content of foods at home, every 15 grams is 1 Carb Choice We softened the Sensitivity Factor so when you correct high BG it will not drop you too low. Upload your pump in 7 days so we can review these changes over the phone

## 2014-03-09 NOTE — Progress Notes (Signed)
  Pump Follow Up Progress Note Patient here for appointment on 03/09/2014. Start time: 1100 End time: 1200  Orders received from MD  giving me permission to make insulin pump adjustments for the following patient. [name], Tina Female, 48 y.o., 01-19-1966   Diet History: B: scrambled egg with 1 toast OR thin bagel with cream cheese or butter OR cereal with 2% milk Snack: fresh fruit occasionally L: skips if eats breakfast or eats early if no breakfast -  Snack: S: 6:30 PM: meat, occasionally starch, vegetables, Crystal Light flavored water Snack, not usually unless needs to treat low BG        Reviewed blood glucose logs on 03/09/2014 via: [facility] and found the following:    Hypoglycemia Hyperglycemia Comments  Overnight Period:      Pre-Meal:    Breakfast      Lunch  YES, without food or bolus BASAL RATE   Supper YES  BASAL RATE  Post-Meal: Breakfast      Lunch      Supper     Bedtime:       Comments: Patient is here for pump setting evaluation post surgery for 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 The new eyeball will be ready later this week, she is very excited! Per the reports, I see hypoglycemia typically after she corrects hyperglycemia. Plan to adjust her ISF, see below   Pump Settings: Date: Current Date: 03/09/2014  Changes in bold print   Basal Rate: Carb Ratio Sensitivity  Basal Rate: Carb Ratio Sensitivity   MN: 1.45 7.3 30 MN: 1.45 7.3 35  (-)  4 A: 1.00   4 A 1.00    10 A 1.60   10 A 1.60      2 P 1.35   2 P 1.35                                  Plan: We have softened your ISF in attempt to prevent low BG after correcting  Continue entering all BG's into Bolus Wizard so you can give a bolus if it is high Continue using Capture Option to record additional info as needed.   Follow up:  Patient to upload to [facility] as needed and notify me so I can see the reports PRN

## 2014-03-30 ENCOUNTER — Telehealth: Payer: Self-pay

## 2014-03-30 NOTE — Telephone Encounter (Signed)
Patient called to get samples of benicar 20-12.5 mg, placed samples up front. Gave 8 boxes a 2 month supply

## 2014-04-17 ENCOUNTER — Encounter: Payer: Medicare HMO | Attending: Physician Assistant | Admitting: *Deleted

## 2014-04-17 VITALS — Ht <= 58 in | Wt 128.2 lb

## 2014-04-17 DIAGNOSIS — Z713 Dietary counseling and surveillance: Secondary | ICD-10-CM | POA: Diagnosis not present

## 2014-04-17 DIAGNOSIS — E119 Type 2 diabetes mellitus without complications: Secondary | ICD-10-CM | POA: Insufficient documentation

## 2014-04-17 DIAGNOSIS — Z794 Long term (current) use of insulin: Secondary | ICD-10-CM | POA: Insufficient documentation

## 2014-04-17 DIAGNOSIS — IMO0001 Reserved for inherently not codable concepts without codable children: Secondary | ICD-10-CM

## 2014-04-17 NOTE — Progress Notes (Signed)
  Pump Follow Up Progress Note Patient here for appointment on 04/17/2014. Start time: 0900 End time: 1000  Orders received from MD  giving me permission to make insulin pump adjustments for the following patient. Tina [name], Tina [name] Female, 48 y.o., 1966-04-20   Diet History: B: scrambled egg with 1 toast OR thin bagel with cream cheese or butter OR cereal with 2% milk Snack: fresh fruit occasionally L: skips if eats breakfast or eats early if no breakfast -  Snack: S: 6:30 PM: meat, occasionally starch, vegetables, Crystal Light flavored water Snack, not usually unless needs to treat low BG        Reviewed blood glucose logs on 03/09/2014 via: [facility] and found the following:    Hypoglycemia Hyperglycemia Comments  Overnight Period:      Pre-Meal:    Breakfast YES  BASAL RATE   Lunch      Supper     Post-Meal: Breakfast      Lunch      Supper     Bedtime:       Comments: Patient is here for pump setting evaluation. Per [facility] Reports, her % of time she is hypoglycemic has increased from 18% to 28%!  It appears the decrease of Sensitivity Factor last month has improved hypoglycemia after correcting highs, but her FBG is often between 40-65 mg/dl. Patient states she occasionally loses her appetite after bolusing for a set carb intake, so I am recommending that she bolus after the meal or at least waiting until she has a better idea how much she will actually be eating.    Pump Settings: Date: Current Date: 04/17/2014  Changes in bold print   Basal Rate: Carb Ratio Sensitivity  Basal Rate: Carb Ratio Sensitivity   MN: 1.45 7.3 MN: 35 MN: 1.45 7.3 MN: 35    4 A: 1.00  6 PM: 40 4 A 0.80  (-)  6 PM: 40  9 A 1.75   9 A 1.75      2 P 1.35   2 P 1.35                                  Plan: Consider giving bolus at the end of the meal or as soon as you know how much Carb you will eating at that time to help prevent some of the hypoglycemia after meals you are having. Double check  the carb content of foods at home, every 15 grams is 1 Carb Choice We lowered the Basal rate at 4 AM from 1.00 to 0.80 to help with fasting low BG's  Upload your pump in 7 days so we can review these changes over the phone    Follow up:  Patient to upload to [facility] as needed and notify me so I can see the reports PRN

## 2014-04-17 NOTE — Patient Instructions (Signed)
Plan: Consider giving bolus at the end of the meal or as soon as you know how much Carb you will eating at that time to help prevent some of the hypoglycemia after meals you are having. Double check the carb content of foods at home, every 15 grams is 1 Carb Choice We lowered the Basal rate at 4 AM from 1.00 to 0.80 to help with fasting low BG's  Upload your pump in 7 days so we can review these changes over the phone

## 2014-05-20 ENCOUNTER — Encounter: Payer: Medicare HMO | Attending: Physician Assistant | Admitting: *Deleted

## 2014-05-20 VITALS — Ht <= 58 in | Wt 133.7 lb

## 2014-05-20 DIAGNOSIS — Z794 Long term (current) use of insulin: Secondary | ICD-10-CM | POA: Insufficient documentation

## 2014-05-20 DIAGNOSIS — E119 Type 2 diabetes mellitus without complications: Secondary | ICD-10-CM | POA: Insufficient documentation

## 2014-05-20 DIAGNOSIS — Z713 Dietary counseling and surveillance: Secondary | ICD-10-CM | POA: Diagnosis not present

## 2014-05-20 DIAGNOSIS — IMO0001 Reserved for inherently not codable concepts without codable children: Secondary | ICD-10-CM

## 2014-05-20 NOTE — Patient Instructions (Signed)
Plan:  Double check the carb content of foods at home, every 15 grams is 1 Carb Choice  We lowered your Sensitivity Factor during the day from 35 to 40 so when you correct high BG at lunch you will get less insulin to prevent the lows you've been having at supper.  Consider aiming for an average of 3 Carb Choices at each meal +/- 1 either way to help with portion control  Consider Arm Chair exercises as a way to get back into some regular activity

## 2014-05-26 NOTE — Progress Notes (Signed)
  Pump Follow Up Progress Note Patient here for appointment on 05/20/2014. Start time: 1000 End time: 1045  Orders received from MD  giving me permission to make insulin pump adjustments for the following patient. [name], Tina Female, 48 y.o., Aug 13, 1965   Reviewed blood glucose logs on 05/20/2014 via: [facility] and found the following:    Hypoglycemia Hyperglycemia Comments  Overnight Period:      Pre-Meal:    Breakfast improved     Lunch      Supper YES  ISF   Post-Meal: Breakfast      Lunch      Supper     Bedtime:       Comments: Patient is here for pump setting evaluation. Average BG over past 2 weeks is 146 +/- 104 mg/dl. The hypoglycemia in AM is improved, but there is frequent hypoglycemia before supper meal, usually after correcting a high BG at lunch time. Plan to decrease ISF per below. Some of the hyperglycemia follows her lows, so they should improve as well.   Pump Settings: Date: Current Date: 05/20/2014  Changes in bold print   Basal Rate: Carb Ratio Sensitivity  Basal Rate: Carb Ratio Sensitivity   MN: 1.45 7.3 MN: 35 MN: 1.45 7.3 MN:40 (-)    4 A: 0.80  6 PM: 40 4 A 0.80   6 PM: 40  9 A 1.75   9 A 1.75      2 P 1.35   2 P 1.35                                  Plan:  Double check the carb content of foods at home, every 15 grams is 1 Carb Choice  We lowered your Sensitivity Factor during the day from 35 to 40 so when you correct high BG at lunch you will get less insulin to prevent the lows you've been having at supper.  Consider aiming for an average of 3 Carb Choices at each meal +/- 1 either way to help with portion control and therefore weight control  Consider Arm Chair exercises as a way to get back into some regular activity   Follow up:  Patient to upload to [facility] as needed and notify me so I can see the reports PRN

## 2014-06-02 ENCOUNTER — Other Ambulatory Visit (HOSPITAL_COMMUNITY): Payer: Self-pay | Admitting: Family Medicine

## 2014-06-02 ENCOUNTER — Other Ambulatory Visit (HOSPITAL_COMMUNITY): Payer: Self-pay | Admitting: Physician Assistant

## 2014-06-02 DIAGNOSIS — Z1231 Encounter for screening mammogram for malignant neoplasm of breast: Secondary | ICD-10-CM

## 2014-06-22 ENCOUNTER — Ambulatory Visit (HOSPITAL_COMMUNITY)
Admission: RE | Admit: 2014-06-22 | Discharge: 2014-06-22 | Disposition: A | Discharge status: Home / Self Care | Payer: Medicare HMO | Source: Ambulatory Visit | Attending: Physician Assistant | Admitting: Physician Assistant

## 2014-06-22 DIAGNOSIS — Z1231 Encounter for screening mammogram for malignant neoplasm of breast: Secondary | ICD-10-CM | POA: Insufficient documentation

## 2014-07-08 ENCOUNTER — Telehealth: Payer: Self-pay | Admitting: *Deleted

## 2014-07-08 ENCOUNTER — Other Ambulatory Visit: Payer: Self-pay | Admitting: *Deleted

## 2014-07-08 NOTE — Telephone Encounter (Signed)
Patient calling for samples of Benicar HCT 20/12.5 5 boxes placed up front for 1 month.

## 2014-08-27 ENCOUNTER — Telehealth: Payer: Self-pay | Admitting: Cardiovascular Disease

## 2014-08-27 NOTE — Telephone Encounter (Signed)
New message      Want samples of benicar.  Would like to pick up today if possible

## 2014-08-27 NOTE — Telephone Encounter (Signed)
Received call that pt was in lobby asking for samples of Benicar/HCT.  I spoke with pt and asked her to have a seat and I would be out to talk with her as soon as possible. Pt made aware it could be 20-30 minutes.  After 25 minutes I went out and spoke with pt. She reports she has been out of Benicar/HCT since the weekend.  She is not voicing any complaints except being out of medication and cannot afford it. I explained to pt we did not have any samples of Benicar/HCT 20/12.5 mg in office.  Pt was last seen by Dr. Angelena Form in October 2014 and was not on Benicar/HCT.  Medication was started by pulmonary.  Pt also sees primary care.  I asked pt to contact primary care or pulmonary for samples or to see if medication could be changed to a cheaper alternative.  I attempted to tell pt I could send message to Dr.McAlhany to see if she could be placed on another medication but pt stated she would take care of it and left office.  She does have appt scheduled with Dr. Angelena Form on September 17, 2014 and is aware of this appt.

## 2014-08-28 ENCOUNTER — Telehealth: Payer: Self-pay | Admitting: Pulmonary Disease

## 2014-08-28 MED ORDER — OLMESARTAN MEDOXOMIL-HCTZ 20-12.5 MG PO TABS: 1.0000 | ORAL_TABLET | Freq: Every day | ORAL | Status: DC

## 2014-08-28 NOTE — Telephone Encounter (Signed)
Spoke with pt, Benicar samples placed up front.  nohting further needed.

## 2014-09-17 ENCOUNTER — Ambulatory Visit (INDEPENDENT_AMBULATORY_CARE_PROVIDER_SITE_OTHER): Payer: Medicare HMO | Admitting: Cardiovascular Disease

## 2014-09-17 ENCOUNTER — Encounter: Payer: Self-pay | Admitting: Cardiovascular Disease

## 2014-09-17 VITALS — BP 100/58 | HR 98 | Ht <= 58 in | Wt 138.2 lb

## 2014-09-17 DIAGNOSIS — I10 Essential (primary) hypertension: Secondary | ICD-10-CM

## 2014-09-17 DIAGNOSIS — I491 Atrial premature depolarization: Secondary | ICD-10-CM

## 2014-09-17 DIAGNOSIS — R5383 Other fatigue: Secondary | ICD-10-CM

## 2014-09-17 MED ORDER — HYDROCHLOROTHIAZIDE 12.5 MG PO TABS: 12.5000 mg | ORAL_TABLET | Freq: Every day | ORAL | Status: DC

## 2014-09-17 MED ORDER — LOSARTAN POTASSIUM 50 MG PO TABS: 50.0000 mg | ORAL_TABLET | Freq: Every day | ORAL | Status: DC

## 2014-09-17 NOTE — Patient Instructions (Addendum)
Your physician wants you to follow-up in:  12 months. You will receive a reminder letter in the mail two months in advance. If you don't receive a letter, please call our office to schedule the follow-up appointment.  Your physician has recommended you make the following change in your medication:    Stop Benicar/HCT  Start Cozaar 50 mg by mouth daily.  Start hydrochlorothiazide 12.5 mg by mouth daily

## 2014-09-17 NOTE — Progress Notes (Signed)
History of Present Illness: 49 yo female with history of DM, HTN, hyperlipidemia, Turner's syndrome who is here today for cardiac follow up. I saw her in 2012 as a new patient for evaluation of SOB and palpitations. She told me that she has a high heart rate at rest, with HR of 95-105 in am. She described palpitations, heart racing. 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 No chest pain, dizziness, near syncope or syncope. I arranged a 48 Hour Holter monitor and an echo. Her Holter monitor showed PACs. There was a lot of artifact on the monitor. NO SVT. Her echo was normal.   She is here today for follow up.  No chest pain or SOB. No awareness of palpitations. She has no complaints except for fatigue which is chronic. She is currently holding Lipitor per request of her primary care provider.     Primary Care Physician:  Fulton Mole   Lipids: Followed in primary care.   Past Medical History  Diagnosis Date  . Dyspnea   . Tachycardia   . DM (diabetes mellitus), type 1   . Hypertension   . Hyperlipidemia   . Turner's syndrome   . Elevated LFTs   . Visual loss, bilateral     right eye light perception only and left eye 20/100  . Retinal detachment   . Diabetic retinopathy     Past Surgical History  Procedure Laterality Date  . Hepatic adenoma removed    . Tonsillectomy    . Retinal detachment surgery    . Cholecystectomy    . Cataract extraction      Current Outpatient Prescriptions  Medication Sig Dispense Refill  . atorvastatin (LIPITOR) 10 MG tablet Take 10 mg by mouth daily.     Marland Kitchen diltiazem (CARDIZEM CD) 120 MG 24 hr capsule Take 1 capsule (120 mg total) by mouth daily. 30 capsule 11  . glucose blood test strip USE TO TEST 6 TO 8 TIMES DAILY AS NEEDED    . insulin aspart (NOVOLOG) 100 UNIT/ML injection INJECT AS DIRECTED 60 UNITS EVERY DAY    . Insulin Human (INSULIN PUMP) SOLN Inject 25 each into the skin See admin instructions. "Novolog" Max of  25 units per meal sitting.    . montelukast (SINGULAIR) 10 MG tablet Take 10 mg by mouth daily.    Marland Kitchen olmesartan-hydrochlorothiazide (BENICAR HCT) 20-12.5 MG per tablet Take 1 tablet by mouth daily.     No current facility-administered medications for this visit.    No Known Allergies  History   Social History  . Marital Status: Married    Spouse Name: N/A  . Number of Children: N/A  . Years of Education: N/A   Occupational History  . Disabled    Social History Main Topics  . Smoking status: Former Smoker -- 0.20 packs/day for .5 years    Types: Cigarettes    Quit date: 07/17/1988  . Smokeless tobacco: Never Used     Comment: many years ago  . Alcohol Use: Yes     Comment: social  . Drug Use: No  . Sexual Activity: Not on file   Other Topics Concern  . Not on file   Social History Narrative   Married, disabled due to visual problems. She is legally blind.   No children.   No caffeine reported.    Family History  Problem Relation Age of Onset  . Diabetes Maternal Grandmother   . Emphysema Paternal Grandfather   .  Cancer Father     kidney    Review of Systems:  As stated in the HPI and otherwise negative.   BP 100/58 mmHg  Pulse 98  Ht 4\' 9"  (1.448 m)  Wt 138 lb 3.2 oz (62.687 kg)  BMI 29.90 kg/m2  Physical Examination: General: Well developed, well nourished, NAD HEENT: OP clear, mucus membranes moist SKIN: warm, dry. No rashes. Neuro: No focal deficits Musculoskeletal: Muscle strength 5/5 all ext Psychiatric: Mood and affect normal Neck: No JVD, no carotid bruits, no thyromegaly, no lymphadenopathy. Lungs:Clear bilaterally, no wheezes, rhonci, crackles Cardiovascular: Regular rate and rhythm. No murmurs, gallops or rubs. Abdomen:Soft. Bowel sounds present. Non-tender.  Extremities: No lower extremity edema. Pulses are 2 + in the bilateral DP/PT.  EKG: NSR, rate 98 bpm.   Assessment and Plan:   1. PAC: Tolerating Cardizem. No palpitations.   2.  HTN: BP is controlled. She is not able to afford Benicar/HCT. She has been trying to get samples. Will change to Losartan 50 mg daily and HCTZ 12.5 mg daily. Will stop Benicar/HCT.   3. Fatigue: could be related to her statin or Cardizem. She is now on a statin holiday.

## 2015-05-06 ENCOUNTER — Encounter: Payer: Medicare HMO | Attending: Physician Assistant | Admitting: *Deleted

## 2015-05-06 VITALS — Ht <= 58 in | Wt 141.9 lb

## 2015-05-06 DIAGNOSIS — Z794 Long term (current) use of insulin: Secondary | ICD-10-CM | POA: Diagnosis not present

## 2015-05-06 DIAGNOSIS — Z713 Dietary counseling and surveillance: Secondary | ICD-10-CM | POA: Diagnosis not present

## 2015-05-06 DIAGNOSIS — E119 Type 2 diabetes mellitus without complications: Secondary | ICD-10-CM | POA: Insufficient documentation

## 2015-05-06 DIAGNOSIS — IMO0001 Reserved for inherently not codable concepts without codable children: Secondary | ICD-10-CM

## 2015-05-06 NOTE — Patient Instructions (Signed)
Plan:  Aim for 2-3 Carb Choices per meal (30-45 grams)  Aim for 0-1 Carbs per snack if hungry  Each serving of fat is 5 grams of fat and 50 calories, Aim for up to 3 servings of fat at each meal = 15 grams of fat Include lean protein in moderation with your meals and snacks Consider reading food labels for Total Carbohydrate and Fat Grams of foods Consider  increasing your activity level by looking into pool walking or any exercise you can do at home.

## 2015-05-14 ENCOUNTER — Encounter: Payer: Self-pay | Admitting: *Deleted
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
## 2015-06-17 ENCOUNTER — Ambulatory Visit: Payer: Medicare HMO | Admitting: *Deleted

## 2015-07-20 ENCOUNTER — Telehealth: Payer: Self-pay | Admitting: Cardiovascular Disease

## 2015-07-20 NOTE — Telephone Encounter (Signed)
Left message to call back  

## 2015-07-20 NOTE — Telephone Encounter (Signed)
New message     Patient calling wants to speak with nurse regarding b/p medication. Patient verbalize we have the information on file & did not know the name of the medication.

## 2015-07-20 NOTE — Telephone Encounter (Signed)
Spoke with pt. She reports 30 lb weight gain in last year (from 115 lbs to 140 lbs) and thinks this may be related to change in blood pressure medication at last office visit with Dr. Angelena Form in March 2016.  At March 2016 office visit pt weighed 138 lbs.  She reports she now weighs 140 lbs.   I reviewed recorded weights and there are no weights of 115 lbs for last 4 years.  I spoke with pt and gave her this information and told her I did not think weight gain was related to this medication change.  I encouraged her to follow up with nutritionist.

## 2015-08-30 ENCOUNTER — Emergency Department (HOSPITAL_COMMUNITY): Payer: Medicare HMO

## 2015-08-30 ENCOUNTER — Encounter (HOSPITAL_COMMUNITY): Payer: Self-pay

## 2015-08-30 ENCOUNTER — Emergency Department (HOSPITAL_COMMUNITY)
Admission: EM | Admit: 2015-08-30 | Discharge: 2015-08-31 | Disposition: A | Discharge status: Home / Self Care | Payer: Medicare HMO | Attending: Emergency Medicine | Admitting: Emergency Medicine

## 2015-08-30 DIAGNOSIS — E785 Hyperlipidemia, unspecified: Secondary | ICD-10-CM | POA: Diagnosis not present

## 2015-08-30 DIAGNOSIS — Z8669 Personal history of other diseases of the nervous system and sense organs: Secondary | ICD-10-CM | POA: Diagnosis not present

## 2015-08-30 DIAGNOSIS — Z79899 Other long term (current) drug therapy: Secondary | ICD-10-CM | POA: Insufficient documentation

## 2015-08-30 DIAGNOSIS — E109 Type 1 diabetes mellitus without complications: Secondary | ICD-10-CM | POA: Insufficient documentation

## 2015-08-30 DIAGNOSIS — R0602 Shortness of breath: Secondary | ICD-10-CM | POA: Diagnosis not present

## 2015-08-30 DIAGNOSIS — I10 Essential (primary) hypertension: Secondary | ICD-10-CM | POA: Diagnosis not present

## 2015-08-30 DIAGNOSIS — Q969 Turner's syndrome, unspecified: Secondary | ICD-10-CM | POA: Diagnosis not present

## 2015-08-30 DIAGNOSIS — R079 Chest pain, unspecified: Secondary | ICD-10-CM | POA: Insufficient documentation

## 2015-08-30 DIAGNOSIS — Z87891 Personal history of nicotine dependence: Secondary | ICD-10-CM | POA: Insufficient documentation

## 2015-08-30 DIAGNOSIS — Z794 Long term (current) use of insulin: Secondary | ICD-10-CM | POA: Insufficient documentation

## 2015-08-30 LAB — CBC WITH DIFFERENTIAL/PLATELET
BASOS ABS: 0.1 10*3/uL (ref 0.0–0.1)
BASOS PCT: 1 %
EOS PCT: 2 %
Eosinophils Absolute: 0.2 10*3/uL (ref 0.0–0.7)
HCT: 37 % (ref 36.0–46.0)
Hemoglobin: 12.2 g/dL (ref 12.0–15.0)
Lymphocytes Relative: 22 %
Lymphs Abs: 2 10*3/uL (ref 0.7–4.0)
MCH: 29.8 pg (ref 26.0–34.0)
MCHC: 33 g/dL (ref 30.0–36.0)
MCV: 90.2 fL (ref 78.0–100.0)
Monocytes Absolute: 0.6 10*3/uL (ref 0.1–1.0)
Monocytes Relative: 7 %
Neutro Abs: 6.4 10*3/uL (ref 1.7–7.7)
Neutrophils Relative %: 68 %
PLATELETS: 214 10*3/uL (ref 150–400)
RBC: 4.1 MIL/uL (ref 3.87–5.11)
RDW: 13.5 % (ref 11.5–15.5)
WBC: 9.2 10*3/uL (ref 4.0–10.5)

## 2015-08-30 NOTE — ED Provider Notes (Signed)
CSN: [id number]     Arrival date & time 08/30/15  2225 History   First MD Initiated Contact with Patient 08/30/15 2242     Chief Complaint  Patient presents with  . Chest Pain  . Shortness of Breath     (Consider location/radiation/quality/duration/timing/severity/associated sxs/prior Treatment) HPI Comments: Patient is a 50 year old female with a history of diabetes mellitus, hypertension, dyslipidemia, and Turner syndrome who presents to the emergency department for evaluation of chest pain. Patient reports that her pain began while having an argument with her husband. Onset was at 2000. EMS was called to the home and administered one sublingual nitroglycerin and 324 mg aspirin. Pain improved with nitroglycerin. Patient states that pain is currently a 2/10. She describes the pain as a pressure as though "an elephant was sitting on my chest". She had some "labored breathing". This has resolved. She denies fever, syncope, nausea, vomiting, extremity numbness or weakness, radiation of her pain especially to the back or upper extremities, and leg swelling. She denies a history of ACS as well as any family history of MI.   Patient is followed by Dr. Angelena Form of cardiology Hx of normal echo in 2012 No hx of stress test  Patient is a 50 y.o. female presenting with chest pain and shortness of breath. The history is provided by the patient. No language interpreter was used.  Chest Pain Associated symptoms: shortness of breath   Associated symptoms: no fever, no nausea, no numbness, not vomiting and no weakness   Shortness of Breath Associated symptoms: chest pain   Associated symptoms: no fever and no vomiting     Past Medical History  Diagnosis Date  . Dyspnea   . Tachycardia   . DM (diabetes mellitus), type 1 ([place])   . Hypertension   . Hyperlipidemia   . Turner's syndrome   . Elevated LFTs   . Visual loss, bilateral     right eye light perception only and left eye 20/100  . Retinal  detachment   . Diabetic retinopathy [facility])    Past Surgical History  Procedure Laterality Date  . Hepatic adenoma removed    . Tonsillectomy    . Retinal detachment surgery    . Cholecystectomy    . Cataract extraction     Family History  Problem Relation Age of Onset  . Diabetes Maternal Grandmother   . Emphysema Paternal Grandfather   . Cancer Father     kidney   Social History  Substance Use Topics  . Smoking status: Former Smoker -- 0.20 packs/day for .5 years    Types: Cigarettes    Quit date: 07/17/1988  . Smokeless tobacco: Never Used     Comment: many years ago  . Alcohol Use: Yes     Comment: social   OB History    No data available      Review of Systems  Constitutional: Negative for fever.  Respiratory: Positive for shortness of breath.   Cardiovascular: Positive for chest pain.  Gastrointestinal: Negative for nausea and vomiting.  Neurological: Negative for weakness and numbness.  All other systems reviewed and are negative.   Allergies  Diamox  Home Medications   Prior to Admission medications   Medication Sig Start Date End Date Taking? Authorizing Provider  atorvastatin (LIPITOR) 10 MG tablet Take 10 mg by mouth daily.     Historical Provider, MD  diltiazem (CARDIZEM CD) 120 MG 24 hr capsule Take 1 capsule (120 mg total) by mouth daily. 04/11/12  [name], MD  glucose blood test strip USE TO TEST 6 TO 8 TIMES DAILY AS NEEDED 07/20/14   Historical Provider, MD  hydrochlorothiazide (HYDRODIURIL) 12.5 MG tablet Take 1 tablet (12.5 mg total) by mouth daily. 09/17/14   [name], MD  insulin aspart (NOVOLOG) 100 UNIT/ML injection INJECT AS DIRECTED 60 UNITS EVERY DAY 07/20/14   Historical Provider, MD  Insulin Human (INSULIN PUMP) SOLN Inject 25 each into the skin See admin instructions. "Novolog" Max of 25 units per meal sitting.    Historical Provider, MD  losartan (COZAAR) 50 MG tablet Take 1 tablet (50 mg total) by mouth daily.  09/17/14   [name], MD  montelukast (SINGULAIR) 10 MG tablet Take 10 mg by mouth daily. 07/23/14 07/23/15  Historical Provider, MD   BP 146/96 mmHg  Pulse 85  Temp(Src) 98 F (36.7 C) (Oral)  Resp 20  SpO2 97%   Physical Exam  Constitutional: She is oriented to person, place, and time. She appears well-developed and well-nourished. No distress.  HENT:  Head: Normocephalic and atraumatic.  Eyes: Conjunctivae and EOM are normal. No scleral icterus.  Neck: Normal range of motion.  Cardiovascular: Normal rate, regular rhythm and intact distal pulses.   Pulmonary/Chest: Effort normal. No respiratory distress. She has no wheezes. She has no rales.  Musculoskeletal: Normal range of motion.  Neurological: She is alert and oriented to person, place, and time.  Skin: Skin is warm and dry. No rash noted. She is not diaphoretic. No erythema. No pallor.  Psychiatric: She has a normal mood and affect. Her behavior is normal.  Nursing note and vitals reviewed.   ED Course  Procedures (including critical care time) Labs Review Labs Reviewed  BASIC METABOLIC PANEL - Abnormal; Notable for the following:    BUN 23 (*)    All other components within normal limits  CBG MONITORING, ED - Abnormal; Notable for the following:    Glucose-Capillary 40 (*)    All other components within normal limits  CBC WITH DIFFERENTIAL/PLATELET  I-STAT TROPOININ, ED  I-STAT TROPOININ, ED  CBG MONITORING, ED    Imaging Review Dg Chest 2 View  08/30/2015  CLINICAL DATA:  Centralized chest pain and shortness of breath tonight. History of tachycardia, hypertension, diabetes. EXAM: CHEST  2 VIEW COMPARISON:  11/30/2013 FINDINGS: Normal heart size and pulmonary vascularity. No focal airspace disease or consolidation in the lungs. No blunting of costophrenic angles. No pneumothorax. Mediastinal contours appear intact. Degenerative changes in the shoulders. IMPRESSION: No active cardiopulmonary disease.  Electronically Signed   By: Lucienne Capers M.D.   On: 08/30/2015 23:25   I have personally reviewed and evaluated these images and lab results as part of my medical decision-making.   EKG Interpretation   Date/Time:  Monday August 30 2015 22:38:02 EST Ventricular Rate:  86 PR Interval:  135 QRS Duration: 77 QT Interval:  383 QTC Calculation: 458 R Axis:   45 Text Interpretation:  Sinus rhythm Probable left atrial enlargement  Borderline T wave abnormalities No significant change since last tracing  Confirmed by Winfred Leeds  MD, SAM [phone]) on 08/30/2015 10:46:51 PM      MDM   Final diagnoses:  Chest pain, unspecified chest pain type    50 year old female with a history of diabetes, hypertension, and dyslipidemia presents to the emergency department for evaluation of chest pain which began during an argument with her husband. Patient has little to no chest pain at present. No complaints of shortness  of breath. Symptoms are slightly atypical for ACS. Patient does have risk factors for heart disease, but no personal or family history of heart attack. EKG shows no STEMI; finding stable compared to last tracing. Chest x-ray does not show any mediastinal widening; doubt aortic dissection. No focal consolidation, pneumothorax, or pneumonia.  Patient has had a negative troponin 2. Second troponin was drawn 6 hours from onset of symptoms. I have a low suspicion for cardiac etiology in this patient. Patient has been offered admission as she has a moderate risk for ACE on Heart Score. Patient, however, does not desire admission and would like to be discharged. She is reliable for follow-up with her cardiologist and I believe this is reasonable as my suspicion for MI is very low. Patient requested refills of her blood pressure medications which were given to her. Return precautions discussed and provided. Patient agreeable to plan with no unaddressed concerns. Patient discharged in good  condition.   Filed Vitals:   08/30/15 2245 08/31/15 0030 08/31/15 0100 08/31/15 0200  BP: 182/94 170/102 178/96 145/92  Pulse: 86 85 86 83  Temp:      TempSrc:      Resp: 14  18 18   SpO2: 98% 97% 97% 98%     Antonietta Breach, PA-C 08/31/15 [place], MD 08/31/15 2343

## 2015-08-30 NOTE — ED Notes (Signed)
Per EMS: Pt complaining of chest pain secondary to argument with husband. Complaining of pressure and SOB. Pt has hx of HTN, DM, and possibly afib? Pt has insulin pump. CBG = 76. EMS gave 324 ASA and 1 nitro. Pain decreased with nitro.

## 2015-08-31 LAB — CBG MONITORING, ED
GLUCOSE-CAPILLARY: 40 mg/dL — AB (ref 65–99)
Glucose-Capillary: 71 mg/dL (ref 65–99)

## 2015-08-31 LAB — BASIC METABOLIC PANEL
ANION GAP: 12 (ref 5–15)
BUN: 23 mg/dL — ABNORMAL HIGH (ref 6–20)
CALCIUM: 8.9 mg/dL (ref 8.9–10.3)
CHLORIDE: 108 mmol/L (ref 101–111)
CO2: 23 mmol/L (ref 22–32)
CREATININE: 0.9 mg/dL (ref 0.44–1.00)
GFR calc non Af Amer: >60 mL/min (ref >60)
GLUCOSE: 83 mg/dL (ref 65–99)
Potassium: 3.6 mmol/L (ref 3.5–5.1)
Sodium: 143 mmol/L (ref 135–145)

## 2015-08-31 LAB — I-STAT TROPONIN, ED
Troponin i, poc: 0 ng/mL (ref 0.00–0.08)
Troponin i, poc: 0 ng/mL (ref 0.00–0.08)

## 2015-08-31 MED ORDER — LOSARTAN POTASSIUM 50 MG PO TABS: 50.0000 mg | ORAL_TABLET | Freq: Every day | ORAL | Status: DC

## 2015-08-31 MED ORDER — HYDROCHLOROTHIAZIDE 12.5 MG PO TABS: 12.5000 mg | ORAL_TABLET | Freq: Every day | ORAL | Status: DC

## 2015-08-31 NOTE — ED Notes (Signed)
This RN went in to discharge and patient states she needs a prescription for her two BP medications.  Claiborne Billings, [place] made aware.  Will write prescription.

## 2015-08-31 NOTE — ED Provider Notes (Signed)
Complaint of chest pain anterior him a nonradiating described as a "heaviness" accompanied by shortness of breath onset during argument one hour prior to coming here. Symptoms lasted an hour, resolved after treatment with aspirin and 1 sublingual nitroglycerin. She has had similar chest pain before however nonexertional. She is followed by a cardiologist for palpitations. Presently asymptomatic on exam no distress lungs clear auscultation heart regular rate and rhythm no murmurs. Based on cardiac risk factors, EKG and story heart score equals 3-4. Hospitalization offered the patient which she declined. She will arrange for close follow-up with her cardiologist Dr.Mcalhanny  Orlie Dakin, MD 08/31/15 0145

## 2015-08-31 NOTE — Discharge Instructions (Signed)
Nonspecific Chest Pain  °Chest pain can be caused by many different conditions. There is always a chance that your pain could be related to something serious, such as a heart attack or a blood clot in your lungs. Chest pain can also be caused by conditions that are not life-threatening. If you have chest pain, it is very important to follow up with your health care provider. °CAUSES  °Chest pain can be caused by: °· Heartburn. °· Pneumonia or bronchitis. °· Anxiety or stress. °· Inflammation around your heart (pericarditis) or lung (pleuritis or pleurisy). °· A blood clot in your lung. °· A collapsed lung (pneumothorax). It can develop suddenly on its own (spontaneous pneumothorax) or from trauma to the chest. °· Shingles infection (varicella-zoster virus). °· Heart attack. °· Damage to the bones, muscles, and cartilage that make up your chest wall. This can include: °¨ Bruised bones due to injury. °¨ Strained muscles or cartilage due to frequent or repeated coughing or overwork. °¨ Fracture to one or more ribs. °¨ Sore cartilage due to inflammation (costochondritis). °RISK FACTORS  °Risk factors for chest pain may include: °· Activities that increase your risk for trauma or injury to your chest. °· Respiratory infections or conditions that cause frequent coughing. °· Medical conditions or overeating that can cause heartburn. °· Heart disease or family history of heart disease. °· Conditions or health behaviors that increase your risk of developing a blood clot. °· Having had chicken pox (varicella zoster). °SIGNS AND SYMPTOMS °Chest pain can feel like: °· Burning or tingling on the surface of your chest or deep in your chest. °· Crushing, pressure, aching, or squeezing pain. °· Dull or sharp pain that is worse when you move, cough, or take a deep breath. °· Pain that is also felt in your back, neck, shoulder, or arm, or pain that spreads to any of these areas. °Your chest pain may come and go, or it may stay  constant. °DIAGNOSIS °Lab tests or other studies may be needed to find the cause of your pain. Your health care provider may have you take a test called an ambulatory ECG (electrocardiogram). An ECG records your heartbeat patterns at the time the test is performed. You may also have other tests, such as: °· Transthoracic echocardiogram (TTE). During echocardiography, sound waves are used to create a picture of all of the heart structures and to look at how blood flows through your heart. °· Transesophageal echocardiogram (TEE). This is a more advanced imaging test that obtains images from inside your body. It allows your health care provider to see your heart in finer detail. °· Cardiac monitoring. This allows your health care provider to monitor your heart rate and rhythm in real time. °· Holter monitor. This is a portable device that records your heartbeat and can help to diagnose abnormal heartbeats. It allows your health care provider to track your heart activity for several days, if needed. °· Stress tests. These can be done through exercise or by taking medicine that makes your heart beat more quickly. °· Blood tests. °· Imaging tests. °TREATMENT  °Your treatment depends on what is causing your chest pain. Treatment may include: °· Medicines. These may include: °¨ Acid blockers for heartburn. °¨ Anti-inflammatory medicine. °¨ Pain medicine for inflammatory conditions. °¨ Antibiotic medicine, if an infection is present. °¨ Medicines to dissolve blood clots. °¨ Medicines to treat coronary artery disease. °· Supportive care for conditions that do not require medicines. This may include: °¨ Resting. °¨ Applying heat   or cold packs to injured areas. °¨ Limiting activities until pain decreases. °HOME CARE INSTRUCTIONS °· If you were prescribed an antibiotic medicine, finish it all even if you start to feel better. °· Avoid any activities that bring on chest pain. °· Do not use any tobacco products, including  cigarettes, chewing tobacco, or electronic cigarettes. If you need help quitting, ask your health care provider. °· Do not drink alcohol. °· Take medicines only as directed by your health care provider. °· Keep all follow-up visits as directed by your health care provider. This is important. This includes any further testing if your chest pain does not go away. °· If heartburn is the cause for your chest pain, you may be told to keep your head raised (elevated) while sleeping. This reduces the chance that acid will go from your stomach into your esophagus. °· Make lifestyle changes as directed by your health care provider. These may include: °¨ Getting regular exercise. Ask your health care provider to suggest some activities that are safe for you. °¨ Eating a heart-healthy diet. A registered dietitian can help you to learn healthy eating options. °¨ Maintaining a healthy weight. °¨ Managing diabetes, if necessary. °¨ Reducing stress. °SEEK MEDICAL CARE IF: °· Your chest pain does not go away after treatment. °· You have a rash with blisters on your chest. °· You have a fever. °SEEK IMMEDIATE MEDICAL CARE IF:  °· Your chest pain is worse. °· You have an increasing cough, or you cough up blood. °· You have severe abdominal pain. °· You have severe weakness. °· You faint. °· You have chills. °· You have sudden, unexplained chest discomfort. °· You have sudden, unexplained discomfort in your arms, back, neck, or jaw. °· You have shortness of breath at any time. °· You suddenly start to sweat, or your skin gets clammy. °· You feel nauseous or you vomit. °· You suddenly feel light-headed or dizzy. °· Your heart begins to beat quickly, or it feels like it is skipping beats. °These symptoms may represent a serious problem that is an emergency. Do not wait to see if the symptoms will go away. Get medical help right away. Call your local emergency services (911 in the [place] Do not drive yourself to the hospital. °  °This  information is not intended to replace advice given to you by your health care provider. Make sure you discuss any questions you have with your health care provider. °  °Document Released: 04/12/2005 Document Revised: 07/24/2014 Document Reviewed: 02/06/2014 °Elsevier Interactive Patient Education ©2016 [employer] ° °

## 2015-08-31 NOTE — ED Notes (Signed)
Patient able to dress and ambulate independently 

## 2015-08-31 NOTE — ED Notes (Signed)
Pt called out.  States she is" feeling like my sugar is low".  This RN checked her sugar and it was 40.  PA made aware.  Pt provided with juice and a [place] sandwich.  Pt axo, denying any other distress

## 2015-10-22 ENCOUNTER — Encounter: Payer: Self-pay | Admitting: *Deleted

## 2015-10-22 ENCOUNTER — Encounter: Payer: Medicare HMO | Attending: Internal Medicine | Admitting: *Deleted

## 2015-10-22 VITALS — Ht <= 58 in | Wt 141.0 lb

## 2015-10-22 DIAGNOSIS — E119 Type 2 diabetes mellitus without complications: Secondary | ICD-10-CM | POA: Insufficient documentation

## 2015-10-22 DIAGNOSIS — E10311 Type 1 diabetes mellitus with unspecified diabetic retinopathy with macular edema: Secondary | ICD-10-CM

## 2015-10-22 NOTE — Patient Instructions (Signed)
Plan:  Aim for 2-3 Carb Choices per meal (30-45 grams)  Aim for 0-1 Carbs per snack if hungry  Include lean protein in moderation with your meals and snacks Consider reading food labels for Total Carbohydrate and Fat Grams of foods Consider  increasing your activity level and use Ysidro Evert as your new coach!

## 2015-10-22 NOTE — Progress Notes (Signed)
Diabetes Self-Management Education  Visit Type:  Follow-up  Appt. Start Time: 0745 Appt. End Time: 0815  10/22/2015  Ms. Emarie Wiersma, identified by name and date of birth, is a 50 y.o. female with a diagnosis of Diabetes:  .   ASSESSMENT  Height 4\' 9"  (1.448 m), weight 141 lb (63.957 kg). Body mass index is 30.5 kg/(m^2).       Diabetes Self-Management Education - 10/22/15 1529    Psychosocial Assessment   Patient Belief/Attitude about Diabetes Motivated to manage diabetes   Self-care barriers Debilitated state due to current medical condition;Hard of hearing;Impaired vision;Lack of material resources   Self-management support Doctor's office;Support group;Family;CDE visits   Patient Concerns Glycemic Control;Weight Control   Preferred Learning Style Auditory;Hands on   Learning Readiness Change in progress   Complications   Last HgB A1C per patient/outside source 6 %   How often do you check your blood sugar? 1-2 times/day   Exercise   Exercise Type ADL's   Patient Education   Nutrition management  Reviewed blood glucose goals for pre and post meals and how to evaluate the patients' food intake on their blood glucose level.;Food label reading, portion sizes and measuring food.   Physical activity and exercise  Helped patient identify appropriate exercises in relation to his/her diabetes, diabetes complications and other health issue.   Individualized Goals (developed by patient)   Nutrition Follow meal plan discussed   Physical Activity Exercise 3-5 times per week  Ask Ysidro Evert, your young boy you are caring for now, to work with you on your exercise plan and going to the gym.   Medications take my medication as prescribed   Monitoring  test my blood glucose as discussed   Patient Self-Evaluation of Goals - Patient rates self as meeting previously set goals (% of time)   Nutrition >75%   Physical Activity 50 - 75 %   Medications >75%   Monitoring >75%   Problem Solving  >75%   Reducing Risk >75%   Health Coping >75%   Outcomes   Program Status Not Completed   Subsequent Visit   Since your last visit have you continued or begun to take your medications as prescribed? Yes   Since your last visit have you experienced any weight changes? No change   Since your last visit, are you checking your blood glucose at least once a day? Yes      Learning Objective:  Patient will have a greater understanding of diabetes self-management. Patient education plan is to attend individual and/or group sessions per assessed needs and concerns.   Plan:   Patient Instructions  Plan:  Aim for 2-3 Carb Choices per meal (30-45 grams)  Aim for 0-1 Carbs per snack if hungry  Include lean protein in moderation with your meals and snacks Consider reading food labels for Total Carbohydrate and Fat Grams of foods Consider  increasing your activity level and use Ysidro Evert as your new coach!        Expected Outcomes:  Demonstrated interest in learning. Expect positive outcomes  Education material provided: No new handouts today  If problems or questions, patient to contact team via:  Phone and Email  Future DSME appointment: - PRN

## 2015-11-19 ENCOUNTER — Ambulatory Visit (INDEPENDENT_AMBULATORY_CARE_PROVIDER_SITE_OTHER): Payer: Medicare HMO | Admitting: Cardiovascular Disease

## 2015-11-19 ENCOUNTER — Encounter: Payer: Self-pay | Admitting: Cardiovascular Disease

## 2015-11-19 VITALS — BP 130/84 | HR 68 | Ht <= 58 in | Wt 140.4 lb

## 2015-11-19 DIAGNOSIS — I491 Atrial premature depolarization: Secondary | ICD-10-CM

## 2015-11-19 DIAGNOSIS — I10 Essential (primary) hypertension: Secondary | ICD-10-CM

## 2015-11-19 NOTE — Progress Notes (Signed)
Chief Complaint  Patient presents with  . Cough      History of Present Illness: 50 yo female with history of DM, HTN, hyperlipidemia, Turner's syndrome who is here today for cardiac follow up. I saw her in 2012 as a new patient for evaluation of SOB and palpitations. She told me that she has a high heart rate at rest, with HR of 95-105 in am. She described palpitations, heart racing. She is very anxious and admits to having a type A personality. She had no chest pain, dizziness, near syncope or syncope. I arranged a 48 Hour Holter monitor and an echo. Her Holter monitor showed PACs. There was a lot of artifact on the monitor. NO SVT. Her echo was normal.   She is here today for follow up.  Seen in ED February 2017 for chest pain while she was arguing with her husband. Troponin negative and EKG normal. No chest pain or SOB since then. No awareness of palpitations.    Primary Care Physician:  Fulton Mole   Lipids: Followed in primary care.   Past Medical History  Diagnosis Date  . Dyspnea   . Tachycardia   . DM (diabetes mellitus), type 1 ([place])   . Hypertension   . Hyperlipidemia   . Turner's syndrome   . Elevated LFTs   . Visual loss, bilateral     right eye light perception only and left eye 20/100  . Retinal detachment   . Diabetic retinopathy [facility])     Past Surgical History  Procedure Laterality Date  . Hepatic adenoma removed    . Tonsillectomy    . Retinal detachment surgery    . Cholecystectomy    . Cataract extraction      Current Outpatient Prescriptions  Medication Sig Dispense Refill  . diltiazem (CARDIZEM CD) 120 MG 24 hr capsule Take 1 capsule (120 mg total) by mouth daily. 30 capsule 11  . glucose blood test strip USE TO TEST 6 TO 8 TIMES DAILY AS NEEDED    . hydrochlorothiazide (HYDRODIURIL) 12.5 MG tablet Take 1 tablet (12.5 mg total) by mouth daily. 90 tablet 0  . insulin aspart (NOVOLOG) 100 UNIT/ML injection Inject into the skin. For Insulin  Pump    . Insulin Human (INSULIN PUMP) SOLN Inject 25 each into the skin See admin instructions. "Novolog" Max of 25 units per meal sitting.    Marland Kitchen losartan (COZAAR) 50 MG tablet Take 1 tablet (50 mg total) by mouth daily. 90 tablet 0  . montelukast (SINGULAIR) 10 MG tablet Take 10 mg by mouth daily.     No current facility-administered medications for this visit.    Allergies  Allergen Reactions  . Diamox [Acetazolamide] Anaphylaxis and Other (See Comments)     Reaction, drop in Blood pressure that caused patient to stay in bed for 5 days, inconherient    Social History   Social History  . Marital Status: Married    Spouse Name: N/A  . Number of Children: N/A  . Years of Education: N/A   Occupational History  . Disabled    Social History Main Topics  . Smoking status: Former Smoker -- 0.20 packs/day for .5 years    Types: Cigarettes    Quit date: 07/17/1988  . Smokeless tobacco: Never Used     Comment: many years ago  . Alcohol Use: Yes     Comment: social  . Drug Use: No  . Sexual Activity: Not on file   Other  Topics Concern  . Not on file   Social History Narrative   Married, disabled due to visual problems. She is legally blind.   No children.   No caffeine reported.    Family History  Problem Relation Age of Onset  . Diabetes Maternal Grandmother   . Emphysema Paternal Grandfather   . Cancer Father     kidney    Review of Systems:  As stated in the HPI and otherwise negative.   BP 130/84 mmHg  Pulse 68  Ht 4\' 9"  (1.448 m)  Wt 140 lb 6.4 oz (63.685 kg)  BMI 30.37 kg/m2  SpO2 97%  Physical Examination: General: Well developed, well nourished, NAD HEENT: OP clear, mucus membranes moist SKIN: warm, dry. No rashes. Neuro: No focal deficits Musculoskeletal: Muscle strength 5/5 all ext Psychiatric: Mood and affect normal Neck: No JVD, no carotid bruits, no thyromegaly, no lymphadenopathy. Lungs:Clear bilaterally, no wheezes, rhonci,  crackles Cardiovascular: Regular rate and rhythm. No murmurs, gallops or rubs. Abdomen:Soft. Bowel sounds present. Non-tender.  Extremities: No lower extremity edema. Pulses are 2 + in the bilateral DP/PT.  EKG:  EKG is not ordered today. The ekg ordered today demonstrates   Recent Labs: 08/30/2015: BUN 23*; Creatinine, Ser 0.90; Hemoglobin 12.2; Platelets 214; Potassium 3.6; Sodium 143   Lipid Panel    Component Value Date/Time   CHOL 273* 10/06/2009 2131   TRIG 87 10/06/2009 2131   HDL 93 10/06/2009 2131   CHOLHDL 2.9 Ratio 10/06/2009 2131   VLDL 17 10/06/2009 2131   LDLCALC 163* 10/06/2009 2131     Wt Readings from Last 3 Encounters:  11/19/15 140 lb 6.4 oz (63.685 kg)  10/22/15 141 lb (63.957 kg)  05/06/15 141 lb 14.4 oz (64.365 kg)     Other studies Reviewed: Additional studies/ records that were reviewed today include: . Review of the above records demonstrates:   Assessment and Plan:   1. PAC (premature atrial contractions): Tolerating Cardizem. No palpitations.   2. HTN: BP is controlled. Continue current medications.   Current medicines are reviewed at length with the patient today.  The patient does not have concerns regarding medicines.  The following changes have been made:  no change  Labs/ tests ordered today include:  No orders of the defined types were placed in this encounter.    Disposition:   FU with me in 12  months  Signed, Lauree Chandler, MD 11/19/2015 4:35 PM    [facility] [place], [place], [place]  [address] Phone: [phone]; Fax: [phone]

## 2015-11-19 NOTE — Patient Instructions (Signed)

## 2015-12-16 ENCOUNTER — Other Ambulatory Visit: Payer: Self-pay | Admitting: Cardiovascular Disease

## 2016-01-14 ENCOUNTER — Telehealth: Payer: Self-pay | Admitting: *Deleted

## 2016-01-14 NOTE — Telephone Encounter (Signed)
Patient notified me this AM that her BG were back to normal @ 108 mg/dl after dealing with high BG's the past 2 days. I had reviewed her pump settings and assisted in replacing the basal rates that were incorrect yesterday AM so she was notifying me that her BG are now back to target ranges.

## 2016-06-05 ENCOUNTER — Telehealth: Payer: Self-pay | Admitting: Cardiovascular Disease

## 2016-06-05 NOTE — Telephone Encounter (Signed)
Left message to call back  

## 2016-06-05 NOTE — Telephone Encounter (Signed)
New message  Pt is calling referring to pain under rib cage  Has had pain for 2 weeks  Last night was worse

## 2016-06-05 NOTE — Telephone Encounter (Signed)
I spoke with pt. She reports pain in chest which started about 2 weeks ago. Describes as sharp and like a cramp. Initially started on right side of chest but now occurs on both sides of chest.  Had a bad episode last night.  Not related to exertion. Episode yesterday was when she was mixing bread. Not associating with eating.  Pt requests appt with Dr. Angelena Form for evaluation.  Does not want to see PA or NP.  Needs appt after 4:00 due to work schedule.  Appt made for pt to see Dr. Angelena Form on 06/19/16 at 4:15.  I encouraged pt to see primary care but pt states she was just there for office visit 2 weeks ago.   I told pt if symptoms worsen prior to 12/4 appt she should go to ED.

## 2016-06-07 ENCOUNTER — Encounter: Payer: Self-pay | Admitting: *Deleted

## 2016-06-19 ENCOUNTER — Ambulatory Visit (INDEPENDENT_AMBULATORY_CARE_PROVIDER_SITE_OTHER): Payer: Medicare HMO | Admitting: Cardiovascular Disease

## 2016-06-19 ENCOUNTER — Encounter (INDEPENDENT_AMBULATORY_CARE_PROVIDER_SITE_OTHER): Payer: Self-pay

## 2016-06-19 ENCOUNTER — Encounter: Payer: Self-pay | Admitting: Cardiovascular Disease

## 2016-06-19 VITALS — BP 130/76 | HR 81 | Wt 137.0 lb

## 2016-06-19 DIAGNOSIS — I10 Essential (primary) hypertension: Secondary | ICD-10-CM | POA: Diagnosis not present

## 2016-06-19 DIAGNOSIS — R072 Precordial pain: Secondary | ICD-10-CM | POA: Diagnosis not present

## 2016-06-19 DIAGNOSIS — I491 Atrial premature depolarization: Secondary | ICD-10-CM | POA: Diagnosis not present

## 2016-06-19 NOTE — Patient Instructions (Addendum)
Medication Instructions:  Your physician recommends that you continue on your current medications as directed. Please refer to the Current Medication list given to you today.   Labwork: none  Testing/Procedures: Your physician has requested that you have an exercise stress myoview. For further information please visit [email or web address]. Please follow instruction sheet, as given.   Follow-Up: Your physician recommends that you schedule a follow-up appointment in: 2-3 months.  Scheduled for February 23,2018 at 4:15.     Any Other Special Instructions Will Be Listed Below (If Applicable).     If you need a refill on your cardiac medications before your next appointment, please call your pharmacy. \

## 2016-06-19 NOTE — Progress Notes (Signed)
Chief Complaint  Patient presents with  . Chest Pain     History of Present Illness: 50 yo female with history of DM, HTN, hyperlipidemia, Turner's syndrome who is here today for cardiac follow up. I saw her in 2012 as a new patient for evaluation of SOB and palpitations. She told me that she has a high heart rate at rest, with HR of 95-105 in am. She described palpitations, heart racing. She is very anxious and admits to having a type A personality. She had no chest pain, dizziness, near syncope or syncope. I arranged a 48 Hour Holter monitor and an echo. Her Holter monitor showed PACs. There was a lot of artifact on the monitor. No SVT. Her echo was normal.   She is here today for follow up.  She describes pain in the epigastric area. This started under the right ribcage and then moved to the left side. The pain was also under her breasts. This was a squeezing pain. This lasted for an hour. The pain was severe. She has dyspnea at night. No clear dyspnea on the day of her severe chest pain.   Primary Care Physician:  Selinda Orion  Past Medical History:  Diagnosis Date  . Diabetic retinopathy ([place])   . DM (diabetes mellitus), type 1 ([place])   . Dyspnea   . Elevated LFTs   . Hyperlipidemia   . Hypertension   . Retinal detachment   . Tachycardia   . Turner's syndrome   . Visual loss, bilateral    right eye light perception only and left eye 20/100    Past Surgical History:  Procedure Laterality Date  . CATARACT EXTRACTION    . CHOLECYSTECTOMY    . Hepatic adenoma removed    . RETINAL DETACHMENT SURGERY    . TONSILLECTOMY      Current Outpatient Prescriptions  Medication Sig Dispense Refill  . diltiazem (CARDIZEM CD) 120 MG 24 hr capsule Take 1 capsule (120 mg total) by mouth daily. 30 capsule 11  . glucose blood test strip USE TO TEST 6 TO 8 TIMES DAILY AS NEEDED    . hydrochlorothiazide (HYDRODIURIL) 12.5 MG tablet TAKE 1 TABLET (12.5 MG TOTAL) BY MOUTH DAILY. 90  tablet 3  . insulin aspart (NOVOLOG) 100 UNIT/ML injection Inject into the skin. For Insulin Pump    . Insulin Human (INSULIN PUMP) SOLN Inject 25 each into the skin See admin instructions. "Novolog" Max of 25 units per meal sitting.    Marland Kitchen losartan (COZAAR) 50 MG tablet TAKE 1 TABLET (50 MG TOTAL) BY MOUTH DAILY. 90 tablet 3  . rosuvastatin (CRESTOR) 20 MG tablet Take 20 mg by mouth daily.    . montelukast (SINGULAIR) 10 MG tablet Take 10 mg by mouth daily.     No current facility-administered medications for this visit.     Allergies  Allergen Reactions  . Diamox [Acetazolamide] Anaphylaxis and Other (See Comments)     Reaction, drop in Blood pressure that caused patient to stay in bed for 5 days, inconherient    Social History   Social History  . Marital status: Married    Spouse name: N/A  . Number of children: N/A  . Years of education: N/A   Occupational History  . Disabled    Social History Main Topics  . Smoking status: Former Smoker    Packs/day: 0.20    Years: 0.50    Types: Cigarettes    Quit date: 07/17/1988  . Smokeless tobacco:  Never Used     Comment: many years ago  . Alcohol use Yes     Comment: social  . Drug use: No  . Sexual activity: Not on file   Other Topics Concern  . Not on file   Social History Narrative   Married, disabled due to visual problems. She is legally blind.   No children.   No caffeine reported.    Family History  Problem Relation Age of Onset  . Cancer Father     kidney  . Diabetes Maternal Grandmother   . Emphysema Paternal Grandfather     Review of Systems:  As stated in the HPI and otherwise negative.   BP 130/76   Pulse 81   Wt 137 lb (62.1 kg)   BMI 29.65 kg/m   Physical Examination: General: Well developed, well nourished, NAD  HEENT: OP clear, mucus membranes moist  SKIN: warm, dry. No rashes. Neuro: No focal deficits  Musculoskeletal: Muscle strength 5/5 all ext  Psychiatric: Mood and affect normal    Neck: No JVD, no carotid bruits, no thyromegaly, no lymphadenopathy.  Lungs:Clear bilaterally, no wheezes, rhonci, crackles Cardiovascular: Regular rate and rhythm. No murmurs, gallops or rubs. Abdomen:Soft. Bowel sounds present. Non-tender.  Extremities: No lower extremity edema. Pulses are 2 + in the bilateral DP/PT.  EKG:  EKG is ordered today. The ekg ordered today demonstrates NSR, rate 81 bpm  Recent Labs: 08/30/2015: BUN 23; Creatinine, Ser 0.90; Hemoglobin 12.2; Platelets 214; Potassium 3.6; Sodium 143   Lipid Panel    Component Value Date/Time   CHOL 273 (H) 10/06/2009 2131   TRIG 87 10/06/2009 2131   HDL 93 10/06/2009 2131   CHOLHDL 2.9 Ratio 10/06/2009 2131   VLDL 17 10/06/2009 2131   LDLCALC 163 (H) 10/06/2009 2131     Wt Readings from Last 3 Encounters:  06/19/16 137 lb (62.1 kg)  11/19/15 140 lb 6.4 oz (63.7 kg)  10/22/15 141 lb (64 kg)     Other studies Reviewed: Additional studies/ records that were reviewed today include: . Review of the above records demonstrates:   Assessment and Plan:   1. PAC (premature atrial contractions): Tolerating Cardizem. No palpitations.   2. HTN: BP is controlled. Continue current medications.   3. Chest pain: Her pain was left sided with typical and atypical features. She has had no previous cardiac ischemic testing. She reports other episodes of chest pain at rest and with exertion over the last year. EKG shows no ischemic changes. I will arrange an exercise nuclear stress test to exclude ischemia.   Current medicines are reviewed at length with the patient today.  The patient does not have concerns regarding medicines.  The following changes have been made:  no change  Labs/ tests ordered today include:   Orders Placed This Encounter  Procedures  . Myocardial Perfusion Imaging  . EKG 12-Lead    Disposition:   FU with me in 3  months  Signed, Lauree Chandler, MD 06/19/2016 5:10 PM    [facility] [place], [place], [place]  [address] Phone: [phone]; Fax: [phone]

## 2016-06-22 ENCOUNTER — Encounter: Payer: Self-pay | Admitting: Physician Assistant

## 2016-07-04 ENCOUNTER — Encounter: Payer: Medicare HMO | Attending: Physician Assistant | Admitting: *Deleted

## 2016-07-04 DIAGNOSIS — Z794 Long term (current) use of insulin: Secondary | ICD-10-CM

## 2016-07-04 DIAGNOSIS — Z713 Dietary counseling and surveillance: Secondary | ICD-10-CM | POA: Insufficient documentation

## 2016-07-04 DIAGNOSIS — E669 Obesity, unspecified: Secondary | ICD-10-CM | POA: Insufficient documentation

## 2016-07-04 DIAGNOSIS — E119 Type 2 diabetes mellitus without complications: Secondary | ICD-10-CM | POA: Diagnosis not present

## 2016-07-04 DIAGNOSIS — IMO0001 Reserved for inherently not codable concepts without codable children: Secondary | ICD-10-CM

## 2016-07-04 NOTE — Progress Notes (Signed)
  Pump Follow Up Progress Note 07/01/2016  Appt time:  [id number]   End Time 1445  Assessment: Comments: Patient contacted me with concern that her FBG's have been higher the last week or so, stating in the 200's or higher. She also states she has run out of insulin a couple of times during the night so that would account for the BG's above 300. She agreed to come in so we could upload her pump, as we did. She is not concerned about her BG's later in the day.  She also reported her husband is not feeling well and between working full time, caring for him and the teenage boy in their home, she is under a lot of stress.   Orders received from MD giving me permission to make insulin pump adjustments for the following patient.  Reviewed blood glucose logs on 07/04/2016 via: [facility]  and found the following:            Hypoglycemia Hyperglycemia Comments  Overnight Period:      Pre-Meal:    Breakfast  YES Basal   Lunch      Supper     Post-Meal: Breakfast      Lunch      Supper     Bedtime:            Pump Settings: Date: Current  See above Date: 07/01/2016   Changes is bold print   Basal Rate: Carb Ratio Sensitivity  Basal Rate: Carb Ratio Sensitivity   MN:    MN:         4 AM 0.90  (+)                                                  Plan: I adjusted her Basal Rate at 4 AM per above by increasing at 10%. She is to let me know if they need any further adjustments as needed.    Follow up:  Patient to provide additional BG data as needed for further review

## 2016-07-06 ENCOUNTER — Telehealth (HOSPITAL_COMMUNITY): Payer: Self-pay | Admitting: *Deleted

## 2016-07-06 NOTE — Telephone Encounter (Signed)
Patient given detailed instructions per Myocardial Perfusion Study Information Sheet for the test on 07/11/16. Patient notified to arrive 15 minutes early and that it is imperative to arrive on time for appointment to keep from having the test rescheduled.  If you need to cancel or reschedule your appointment, please call the office within 24 hours of your appointment. Failure to do so may result in a cancellation of your appointment, and a $50 no show fee. Patient verbalized understanding. Solveig Fangman Jacqueline    

## 2016-07-11 ENCOUNTER — Ambulatory Visit (HOSPITAL_COMMUNITY): Payer: Medicare HMO | Attending: Internal Medicine

## 2016-07-11 DIAGNOSIS — I10 Essential (primary) hypertension: Secondary | ICD-10-CM | POA: Insufficient documentation

## 2016-07-11 DIAGNOSIS — R072 Precordial pain: Secondary | ICD-10-CM | POA: Insufficient documentation

## 2016-07-11 DIAGNOSIS — E119 Type 2 diabetes mellitus without complications: Secondary | ICD-10-CM | POA: Insufficient documentation

## 2016-07-11 LAB — MYOCARDIAL PERFUSION IMAGING
CHL CUP NUCLEAR SRS: 1
CHL CUP RESTING HR STRESS: 82 {beats}/min
LHR: 0.38
LV sys vol: 26 mL
LVDIAVOL: 73 mL (ref 46–106)
NUC STRESS TID: 1.08
Peak HR: 110 {beats}/min
SDS: 1
SSS: 2

## 2016-07-11 MED ORDER — TECHNETIUM TC 99M TETROFOSMIN IV KIT
31.0000 | PACK | Freq: Once | INTRAVENOUS | Status: AC | PRN
  Administered 2016-07-11: 31 via INTRAVENOUS
  Filled 2016-07-11: qty 31

## 2016-07-11 MED ORDER — REGADENOSON 0.4 MG/5ML IV SOLN
0.4000 mg | Freq: Once | INTRAVENOUS | Status: AC
  Administered 2016-07-11: 0.4 mg via INTRAVENOUS

## 2016-07-11 MED ORDER — TECHNETIUM TC 99M TETROFOSMIN IV KIT
10.7000 | PACK | Freq: Once | INTRAVENOUS | Status: AC | PRN
  Administered 2016-07-11: 10.7 via INTRAVENOUS
  Filled 2016-07-11: qty 11

## 2016-08-30 ENCOUNTER — Telehealth: Payer: Self-pay | Admitting: Cardiovascular Disease

## 2016-08-30 NOTE — Telephone Encounter (Signed)
**Note De-Identified  Obfuscation** The pt wants to know why her appt was canceled for 2/23. I attempted to answer her questions but she complained that "you keep talking over yourself". "You know you guys really should notify your pts when you cancel our appts because we have jobs and have to arrange time off for MD appts". I advised her that I will forward this message to Dr Camillia Herter nurse so she can call the pt when she returns to the office.

## 2016-08-30 NOTE — Telephone Encounter (Signed)
New Message    Pt wants to know why her appt was cancelled for 09/08/16? She never got the results from stress test .   Please call after 4pm the pt cant answer phone at work, She really wants Fraser Din to call her

## 2016-09-01 NOTE — Telephone Encounter (Signed)
I spoke with pt. I reviewed stress test results again with her.  She states she was feeling fine until the other day when she had another episode of pain similar to episode she had before last office visit.  She would like to keep Feb 23 appt.  Pt rescheduled to see Dr. Angelena Form on Feb 23,2018 at 4:15.

## 2016-09-01 NOTE — Telephone Encounter (Signed)
Left message to call back  

## 2016-09-08 ENCOUNTER — Ambulatory Visit: Payer: Medicare HMO | Admitting: Cardiovascular Disease

## 2016-09-08 ENCOUNTER — Ambulatory Visit (INDEPENDENT_AMBULATORY_CARE_PROVIDER_SITE_OTHER): Payer: Medicare HMO | Admitting: Cardiovascular Disease

## 2016-09-08 ENCOUNTER — Encounter: Payer: Self-pay | Admitting: Cardiovascular Disease

## 2016-09-08 VITALS — BP 138/80 | HR 84 | Ht <= 58 in | Wt 140.6 lb

## 2016-09-08 DIAGNOSIS — I10 Essential (primary) hypertension: Secondary | ICD-10-CM

## 2016-09-08 DIAGNOSIS — I491 Atrial premature depolarization: Secondary | ICD-10-CM

## 2016-09-08 DIAGNOSIS — R072 Precordial pain: Secondary | ICD-10-CM

## 2016-09-08 NOTE — Patient Instructions (Signed)

## 2016-09-08 NOTE — Progress Notes (Signed)
Chief Complaint  Patient presents with  . Chest Pain     History of Present Illness: 51 yo female with history of DM, HTN, hyperlipidemia, Turner's syndrome who is here today for cardiac follow up. I saw her in 2012 as a new patient for evaluation of SOB and palpitations. She told me that she has a high heart rate at rest, with HR of 95-105 in am. She described palpitations, heart racing. She is very anxious and admits to having a type A personality. She had no chest pain, dizziness, near syncope or syncope. I arranged a 48 Hour Holter monitor and an echo. Her Holter monitor showed PACs. There was a lot of artifact on the monitor. No SVT. Her echo was normal. She has had atypical chest pain over the last year. Nuclear stress test December 2017 with no ischemia.   She is here today for follow up.  She had one episode of right sided chest discomfort at rest last week. No dyspnea.   Primary Care Physician:  Selinda Orion  Past Medical History:  Diagnosis Date  . Diabetic retinopathy ([place])   . DM (diabetes mellitus), type 1 ([place])   . Dyspnea   . Elevated LFTs   . Hyperlipidemia   . Hypertension   . Retinal detachment   . Tachycardia   . Turner's syndrome   . Visual loss, bilateral    right eye light perception only and left eye 20/100    Past Surgical History:  Procedure Laterality Date  . CATARACT EXTRACTION    . CHOLECYSTECTOMY    . Hepatic adenoma removed    . RETINAL DETACHMENT SURGERY    . TONSILLECTOMY      Current Outpatient Prescriptions  Medication Sig Dispense Refill  . diltiazem (CARDIZEM CD) 120 MG 24 hr capsule Take 1 capsule (120 mg total) by mouth daily. 30 capsule 11  . glucose blood test strip USE TO TEST 6 TO 8 TIMES DAILY AS NEEDED    . hydrochlorothiazide (HYDRODIURIL) 12.5 MG tablet TAKE 1 TABLET (12.5 MG TOTAL) BY MOUTH DAILY. 90 tablet 3  . insulin aspart (NOVOLOG) 100 UNIT/ML injection Inject into the skin. For Insulin Pump    . Insulin Human  (INSULIN PUMP) SOLN Inject 25 each into the skin See admin instructions. "Novolog" Max of 25 units per meal sitting.    Marland Kitchen losartan (COZAAR) 50 MG tablet TAKE 1 TABLET (50 MG TOTAL) BY MOUTH DAILY. 90 tablet 3  . rosuvastatin (CRESTOR) 20 MG tablet Take 20 mg by mouth daily.     No current facility-administered medications for this visit.     Allergies  Allergen Reactions  . Diamox [Acetazolamide] Anaphylaxis and Other (See Comments)     Reaction, drop in Blood pressure that caused patient to stay in bed for 5 days, inconherient    Social History   Social History  . Marital status: Married    Spouse name: N/A  . Number of children: N/A  . Years of education: N/A   Occupational History  . Disabled    Social History Main Topics  . Smoking status: Former Smoker    Packs/day: 0.20    Years: 0.50    Types: Cigarettes    Quit date: 07/17/1988  . Smokeless tobacco: Never Used     Comment: many years ago  . Alcohol use Yes     Comment: social  . Drug use: No  . Sexual activity: Not on file   Other Topics Concern  .  Not on file   Social History Narrative   Married, disabled due to visual problems. She is legally blind.   No children.   No caffeine reported.    Family History  Problem Relation Age of Onset  . Cancer Father     kidney  . Diabetes Maternal Grandmother   . Emphysema Paternal Grandfather     Review of Systems:  As stated in the HPI and otherwise negative.   BP 138/80   Pulse 84   Ht 4\' 9"  (1.448 m)   Wt 140 lb 9.6 oz (63.8 kg)   BMI 30.43 kg/m   Physical Examination: General: Well developed, well nourished, NAD  HEENT: OP clear, mucus membranes moist  SKIN: warm, dry. No rashes. Neuro: No focal deficits  Musculoskeletal: Muscle strength 5/5 all ext  Psychiatric: Mood and affect normal  Neck: No JVD, no carotid bruits, no thyromegaly, no lymphadenopathy.  Lungs:Clear bilaterally, no wheezes, rhonci, crackles Cardiovascular: Regular rate and  rhythm. No murmurs, gallops or rubs. Abdomen:Soft. Bowel sounds present. Non-tender.  Extremities: No lower extremity edema. Pulses are 2 + in the bilateral DP/PT.  EKG:  EKG is not  ordered today. The ekg ordered today demonstrates   Recent Labs: No results found for requested labs within last 8760 hours.   Lipid Panel    Component Value Date/Time   CHOL 273 (H) 10/06/2009 2131   TRIG 87 10/06/2009 2131   HDL 93 10/06/2009 2131   CHOLHDL 2.9 Ratio 10/06/2009 2131   VLDL 17 10/06/2009 2131   LDLCALC 163 (H) 10/06/2009 2131     Wt Readings from Last 3 Encounters:  09/08/16 140 lb 9.6 oz (63.8 kg)  07/11/16 137 lb (62.1 kg)  06/19/16 137 lb (62.1 kg)     Other studies Reviewed: Additional studies/ records that were reviewed today include: . Review of the above records demonstrates:   Assessment and Plan:   1. PAC (premature atrial contractions): Tolerating Cardizem. No palpitations.   2. HTN: BP is controlled. Continue current medications.   3. Chest pain: Her pain was left sided with atypical features. Nuclear stress test December 2017 without ischemic changes. I do not think her pain is cardiac.   Current medicines are reviewed at length with the patient today.  The patient does not have concerns regarding medicines.  The following changes have been made:  no change  Labs/ tests ordered today include:   No orders of the defined types were placed in this encounter.   Disposition:   FU with me in 3  months  Signed, Lauree Chandler, MD 09/08/2016 4:31 PM    [facility] [place], [place], [place]  [address] Phone: [phone]; Fax: [phone]

## 2016-10-18 MED FILL — NovoLOG 100 UNIT/ML SOLN: 100 | 28 days supply | Qty: 30 | Fill #0

## 2016-10-30 ENCOUNTER — Other Ambulatory Visit: Payer: Self-pay | Admitting: Physician Assistant

## 2016-10-30 DIAGNOSIS — Z1231 Encounter for screening mammogram for malignant neoplasm of breast: Secondary | ICD-10-CM

## 2016-11-27 ENCOUNTER — Ambulatory Visit
Admission: RE | Admit: 2016-11-27 | Discharge: 2016-11-27 | Disposition: A | Discharge status: Home / Self Care | Payer: Medicare HMO | Source: Ambulatory Visit | Attending: Physician Assistant | Admitting: Physician Assistant

## 2016-11-27 DIAGNOSIS — Z1231 Encounter for screening mammogram for malignant neoplasm of breast: Secondary | ICD-10-CM

## 2016-12-23 ENCOUNTER — Other Ambulatory Visit: Payer: Self-pay | Admitting: Cardiovascular Disease

## 2017-01-12 MED FILL — NovoLOG 100 UNIT/ML SOLN: 100 | 28 days supply | Qty: 30 | Fill #1

## 2017-02-28 MED FILL — NovoLOG 100 UNIT/ML SOLN: 100 | 29 days supply | Qty: 30 | Fill #0

## 2017-04-16 MED FILL — NovoLOG 100 UNIT/ML SOLN: 100 | 38 days supply | Qty: 40 | Fill #0

## 2017-06-19 MED FILL — NovoLOG 100 UNIT/ML SOLN: 100 | 38 days supply | Qty: 40 | Fill #1

## 2017-07-18 ENCOUNTER — Telehealth: Payer: Self-pay | Admitting: *Deleted

## 2017-07-18 NOTE — Telephone Encounter (Signed)
  Telephone call:  07/18/2017    Assessment: Comments: Patient contacted me with concern that her FBG's have been too low the last month. She has required assistance on several occasions from her husband.  Orders received from MD giving me permission to make insulin pump adjustments for the following patient.  Pump Settings: Date: Current   Date: 07/18/2017   Changes is bold print   Basal Rate: Carb Ratio Sensitivity  Basal Rate: Carb Ratio Sensitivity   MN: 1.45   MN: 1.45    4 AM 0.900   4 AM 0.850  (-)    8 AM 2.30   8 AM 2.00  (-)    10 AM 1.75   10 AM 1.65  (-)    2 PM 1.35   2 PM 1.35                       Plan: I adjusted her Basal Rate per above over the phone with her husband making the setting changes and confirming with me.   Follow up:  Patient to provide additional BG data as needed for further review

## 2017-08-03 ENCOUNTER — Telehealth: Payer: Self-pay | Admitting: Cardiovascular Disease

## 2017-08-03 NOTE — Telephone Encounter (Signed)
Spoke with pt and she wants a sooner appt than Dr. Camillia Herter first available.  Advised I will send message to [facility] and have her f/u with pt once she returns to office.  Pt verbalized understanding.

## 2017-08-03 NOTE — Telephone Encounter (Signed)
New message  Pt verbalized that she is calling for the RN  She want an appt ASAP with Dr.Mcalhany and I offered   10/29/2017 and she declined and want the rn to call with a sooner appt

## 2017-08-08 NOTE — Telephone Encounter (Signed)
I spoke with pt. She is not having any problems but is due for follow up. I scheduled her to see Dr. Angelena Form on 08/22/17 at 4:00

## 2017-08-17 MED FILL — NovoLOG 100 UNIT/ML SOLN: 100 | 38 days supply | Qty: 40 | Fill #0

## 2017-08-22 ENCOUNTER — Ambulatory Visit (INDEPENDENT_AMBULATORY_CARE_PROVIDER_SITE_OTHER): Payer: Medicare HMO | Admitting: Cardiovascular Disease

## 2017-08-22 ENCOUNTER — Encounter: Payer: Self-pay | Admitting: Cardiovascular Disease

## 2017-08-22 ENCOUNTER — Encounter (INDEPENDENT_AMBULATORY_CARE_PROVIDER_SITE_OTHER): Payer: Self-pay

## 2017-08-22 VITALS — BP 145/90 | HR 88 | Ht <= 58 in | Wt 151.8 lb

## 2017-08-22 DIAGNOSIS — I10 Essential (primary) hypertension: Secondary | ICD-10-CM

## 2017-08-22 DIAGNOSIS — I491 Atrial premature depolarization: Secondary | ICD-10-CM

## 2017-08-22 NOTE — Progress Notes (Signed)
Chief Complaint  Patient presents with  . Follow-up    PAC    History of Present Illness: 52 yo female with history of DM, HTN, hyperlipidemia, Turner's syndrome who is here today for cardiac follow up. I saw her in 2012 as a new patient for evaluation of SOB and palpitations. She told me that she has a high heart rate at rest, with HR of 95-105 in am. She described palpitations when anxious. I arranged a 48 Hour Holter monitor and an echo. Her Holter monitor showed PACs. There was a lot of artifact on the monitor. No SVT. Her echo was normal. She then had /o chest pain in 2017. Nuclear stress test December 2017 with no ischemia.   She is here today for follow up. The patient denies any chest pain, dyspnea, palpitations, lower extremity edema, orthopnea, PND, dizziness, near syncope or syncope.   Primary Care Physician:  Selinda Orion  Past Medical History:  Diagnosis Date  . Diabetic retinopathy ([place])   . DM (diabetes mellitus), type 1 ([place])   . Dyspnea   . Elevated LFTs   . Hyperlipidemia   . Hypertension   . Retinal detachment   . Tachycardia   . Turner's syndrome   . Visual loss, bilateral    right eye light perception only and left eye 20/100    Past Surgical History:  Procedure Laterality Date  . CATARACT EXTRACTION    . CHOLECYSTECTOMY    . Hepatic adenoma removed    . RETINAL DETACHMENT SURGERY    . TONSILLECTOMY      Current Outpatient Medications  Medication Sig Dispense Refill  . diltiazem (CARDIZEM CD) 120 MG 24 hr capsule Take 1 capsule (120 mg total) by mouth daily. 30 capsule 11  . glucose blood test strip USE TO TEST 6 TO 8 TIMES DAILY AS NEEDED    . hydrochlorothiazide (HYDRODIURIL) 12.5 MG tablet Take 1 tablet (12.5 mg total) by mouth daily. 90 tablet 2  . insulin aspart (NOVOLOG) 100 UNIT/ML injection Inject into the skin. For Insulin Pump    . Insulin Human (INSULIN PUMP) SOLN Inject 25 each into the skin See admin instructions. "Novolog" Max  of 25 units per meal sitting.    Marland Kitchen losartan (COZAAR) 50 MG tablet Take 1 tablet (50 mg total) by mouth daily. 90 tablet 2  . rosuvastatin (CRESTOR) 20 MG tablet Take 20 mg by mouth daily.     No current facility-administered medications for this visit.     Allergies  Allergen Reactions  . Diamox [Acetazolamide] Anaphylaxis and Other (See Comments)     Reaction, drop in Blood pressure that caused patient to stay in bed for 5 days, inconherient    Social History   Socioeconomic History  . Marital status: Married    Spouse name: Not on file  . Number of children: Not on file  . Years of education: Not on file  . Highest education level: Not on file  Social Needs  . Financial resource strain: Not on file  . Food insecurity - worry: Not on file  . Food insecurity - inability: Not on file  . Transportation needs - medical: Not on file  . Transportation needs - non-medical: Not on file  Occupational History  . Occupation: Disabled  Tobacco Use  . Smoking status: Former Smoker    Packs/day: 0.20    Years: 0.50    Pack years: 0.10    Types: Cigarettes    Last attempt  to quit: 07/17/1988    Years since quitting: 29.1  . Smokeless tobacco: Never Used  . Tobacco comment: many years ago  Substance and Sexual Activity  . Alcohol use: Yes    Comment: social  . Drug use: No  . Sexual activity: Not on file  Other Topics Concern  . Not on file  Social History Narrative   Married, disabled due to visual problems. She is legally blind.   No children.   No caffeine reported.    Family History  Problem Relation Age of Onset  . Cancer Father        kidney  . Diabetes Maternal Grandmother   . Emphysema Paternal Grandfather     Review of Systems:  As stated in the HPI and otherwise negative.   BP (!) 145/90   Pulse 88   Ht 4\' 9"  (1.448 m)   Wt 151 lb 12.8 oz (68.9 kg)   SpO2 96%   BMI 32.85 kg/m   Physical Examination:  General: Well developed, well nourished, NAD    HEENT: OP clear, mucus membranes moist  SKIN: warm, dry. No rashes. Neuro: No focal deficits  Musculoskeletal: Muscle strength 5/5 all ext  Psychiatric: Mood and affect normal  Neck: No JVD, no carotid bruits, no thyromegaly, no lymphadenopathy.  Lungs:Clear bilaterally, no wheezes, rhonci, crackles Cardiovascular: Regular rate and rhythm. No murmurs, gallops or rubs. Abdomen:Soft. Bowel sounds present. Non-tender.  Extremities: No lower extremity edema. Pulses are 2 + in the bilateral DP/PT.  EKG:  EKG is  ordered today. The ekg ordered today demonstrates NSR, rate 88 bpm. T wave abn anterolateral leads, unchanged from EKG in Feb 2017  Recent Labs: No results found for requested labs within last 8760 hours.   Lipid Panel    Component Value Date/Time   CHOL 273 (H) 10/06/2009 2131   TRIG 87 10/06/2009 2131   HDL 93 10/06/2009 2131   CHOLHDL 2.9 Ratio 10/06/2009 2131   VLDL 17 10/06/2009 2131   LDLCALC 163 (H) 10/06/2009 2131     Wt Readings from Last 3 Encounters:  08/22/17 151 lb 12.8 oz (68.9 kg)  09/08/16 140 lb 9.6 oz (63.8 kg)  07/11/16 137 lb (62.1 kg)     Other studies Reviewed: Additional studies/ records that were reviewed today include: . Review of the above records demonstrates:   Assessment and Plan:   1. PACs: Tolerating Cardizem with rare palpitations.   2. HTN: BP is controlled. No changes  3. Chest pain: Nuclear stress test December 2017 without ischemic changes. Her pain is felt to be non-cardiac.    Current medicines are reviewed at length with the patient today.  The patient does not have concerns regarding medicines.  The following changes have been made:  no change  Labs/ tests ordered today include:   No orders of the defined types were placed in this encounter.   Disposition:   FU with me in 12  months  Signed, Lauree Chandler, MD 08/22/2017 4:37 PM    [facility] [place], [place], [place]   [address] Phone: [phone]; Fax: [phone]

## 2017-08-22 NOTE — Patient Instructions (Signed)

## 2017-08-23 NOTE — Addendum Note (Signed)
Addended by: Mendel Ryder on: 08/23/2017 01:40 PM   Modules accepted: Orders

## 2017-10-02 ENCOUNTER — Encounter: Payer: Self-pay | Admitting: Gynecology

## 2017-10-02 ENCOUNTER — Telehealth: Payer: Self-pay | Admitting: *Deleted

## 2017-10-02 ENCOUNTER — Encounter: Payer: Self-pay | Admitting: Obstetrics & Gynecology

## 2017-10-02 ENCOUNTER — Ambulatory Visit: Payer: Medicare HMO | Admitting: Obstetrics & Gynecology

## 2017-10-02 VITALS — BP 146/78 | Ht <= 58 in | Wt 151.0 lb

## 2017-10-02 DIAGNOSIS — Z8639 Personal history of other endocrine, nutritional and metabolic disease: Secondary | ICD-10-CM | POA: Diagnosis not present

## 2017-10-02 DIAGNOSIS — Z01411 Encounter for gynecological examination (general) (routine) with abnormal findings: Secondary | ICD-10-CM

## 2017-10-02 DIAGNOSIS — M818 Other osteoporosis without current pathological fracture: Secondary | ICD-10-CM | POA: Diagnosis not present

## 2017-10-02 DIAGNOSIS — Z1151 Encounter for screening for human papillomavirus (HPV): Secondary | ICD-10-CM | POA: Diagnosis not present

## 2017-10-02 DIAGNOSIS — Q969 Turner's syndrome, unspecified: Secondary | ICD-10-CM

## 2017-10-02 DIAGNOSIS — Z1211 Encounter for screening for malignant neoplasm of colon: Secondary | ICD-10-CM

## 2017-10-02 NOTE — Telephone Encounter (Signed)
Referral placed at [facility] they will call pt to schedule.

## 2017-10-02 NOTE — Progress Notes (Signed)
Tina [name] 03-28-1966 [id number]   History:    52 y.o. G0 Married x 25 years.  Originally from [place].  RP:  New patient presenting for annual gyn exam   HPI: Turner Syndrome.  Early Ovarian Failure.  No HRT.  No pelvic pain.  Normal vaginal secretion.  No pain with IC.  Urine/BMs wnl.  Osteoporosis on no treatment.  No h/o fracture. Occasional mild Rt breast tenderness, none currently. No breast lump felt. BMI 34.47.  Not very active physically.  Health labs with Fam MD.  Past medical history,surgical history, family history and social history were all reviewed and documented in the EPIC chart.  Gynecologic History No LMP recorded. Patient is perimenopausal. Contraception: post menopausal status Last Pap: 2017. Results were: normal Last mammogram: 11/2016. Results were: Negative Bone Density: 10/2009 Osteoporosis Bilateral hips.  Lowest T-Score -3.1.  Repeat BD here now. Colonoscopy: Never.  Will organize now.  Obstetric History OB History  Gravida Para Term Preterm AB Living  0 0 0 0 0 0  SAB TAB Ectopic Multiple Live Births  0 0 0 0 0         ROS: A ROS was performed and pertinent positives and negatives are included in the history.  GENERAL: No fevers or chills. HEENT: No change in vision, no earache, sore throat or sinus congestion. NECK: No pain or stiffness. CARDIOVASCULAR: No chest pain or pressure. No palpitations. PULMONARY: No shortness of breath, cough or wheeze. GASTROINTESTINAL: No abdominal pain, nausea, vomiting or diarrhea, melena or bright red blood per rectum. GENITOURINARY: No urinary frequency, urgency, hesitancy or dysuria. MUSCULOSKELETAL: No joint or muscle pain, no back pain, no recent trauma. DERMATOLOGIC: No rash, no itching, no lesions. ENDOCRINE: No polyuria, polydipsia, no heat or cold intolerance. No recent change in weight. HEMATOLOGICAL: No anemia or easy bruising or bleeding. NEUROLOGIC: No headache, seizures, numbness, tingling or weakness.  PSYCHIATRIC: No depression, no loss of interest in normal activity or change in sleep pattern.     Exam:   BP (!) 146/78   Ht 4' 7.5" (1.41 m)   Wt 151 lb (68.5 kg)   BMI 34.47 kg/m   Body mass index is 34.47 kg/m.  General appearance : Well developed well nourished female. No acute distress HEENT: Eyes: no retinal hemorrhage or exudates,  Neck supple, trachea midline, no carotid bruits, no thyroidmegaly Lungs: Clear to auscultation, no rhonchi or wheezes, or rib retractions  Heart: Regular rate and rhythm, no murmurs or gallops Breast:Examined in sitting and supine position were symmetrical in appearance, no palpable masses or tenderness,  no skin retraction, no nipple inversion, no nipple discharge, no skin discoloration, no axillary or supraclavicular lymphadenopathy Abdomen: no palpable masses or tenderness, no rebound or guarding Extremities: no edema or skin discoloration or tenderness  Pelvic: Vulva: Normal             Vagina: No gross lesions or discharge  Cervix: No gross lesions or discharge.  Pap/HR HPV done.  Uterus  AV, normal size, shape and consistency, non-tender and mobile  Adnexa  Without masses or tenderness  Anus: Normal   Assessment/Plan:  52 y.o. female for annual exam   1. Encounter for gynecological examination with abnormal finding Normal gynecologic exam in menopause.  Pap test with high risk HPV done.  Breast exam normal.  Screening mammogram May 2018 negative.  Will schedule for screening mammogram May 2019.  Scheduling her first screening colonoscopy.  Health labs with family physician.  2. Radford Pax  syndrome Early ovarian failure.  No hormone replacement therapy.    3. History of primary ovarian failure Complicated by osteoporosis.  Due for a repeat bone density, will organize here.  Vitamin D, calcium rich nutrition and regular weightbearing physical activity recommended. - DG Bone Density; Future  4. Other osteoporosis without current  pathological fracture As above, will repeat bone density now and patient will follow up for discussion and treatment. - VITAMIN D 25 Hydroxy (Vit-D Deficiency, Fractures) - DG Bone Density; Future  Princess Bruins MD, 11:55 AM 10/02/2017

## 2017-10-02 NOTE — Telephone Encounter (Signed)
-----   Message from Princess Bruins, MD sent at 10/02/2017 12:29 PM EDT ----- Regarding: Screening Colonoscopy 52 yo for screening Colonoscopy

## 2017-10-03 ENCOUNTER — Telehealth: Payer: Self-pay | Admitting: Internal Medicine

## 2017-10-03 LAB — VITAMIN D 25 HYDROXY (VIT D DEFICIENCY, FRACTURES): VIT D 25 HYDROXY: 15 ng/mL — AB (ref 30–100)

## 2017-10-03 NOTE — Telephone Encounter (Signed)
OK 

## 2017-10-03 NOTE — Telephone Encounter (Signed)
Pt appears belong to Dr. Leslie Dales with me, if okay with him JMP

## 2017-10-03 NOTE — Telephone Encounter (Signed)
Hi Dr. Hilarie Fredrickson, this patient is due for a colonoscopy this year. She has seen Dr. Havery Moros in 2014 but would like to continue her care with you because her husband is a patient of yours. Would you take this pt? Thank you

## 2017-10-03 NOTE — Telephone Encounter (Signed)
Dr. Carlean Purl are you ok with the change?

## 2017-10-04 LAB — PAP, TP IMAGING W/ HPV RNA, RFLX HPV TYPE 16,18/45: HPV DNA HIGH RISK: NOT DETECTED

## 2017-10-05 NOTE — Telephone Encounter (Signed)
Spoke w/ pt and let her know about this. She was not able to sch appt at this moment and will call back to sch colon w/ Dr. Hilarie Fredrickson.

## 2017-10-07 ENCOUNTER — Encounter: Payer: Self-pay | Admitting: Obstetrics & Gynecology

## 2017-10-07 NOTE — Patient Instructions (Signed)
1. Encounter for gynecological examination with abnormal finding Normal gynecologic exam in menopause.  Pap test with high risk HPV done.  Breast exam normal.  Screening mammogram May 2018 negative.  Will schedule for screening mammogram May 2019.  Scheduling her first screening colonoscopy.  Health labs with family physician.  2. Turner syndrome Early ovarian failure.  No hormone replacement therapy.    3. History of primary ovarian failure Complicated by osteoporosis.  Due for a repeat bone density, will organize here.  Vitamin D, calcium rich nutrition and regular weightbearing physical activity recommended. - DG Bone Density; Future  4. Other osteoporosis without current pathological fracture As above, will repeat bone density now and patient will follow up for discussion and treatment. - VITAMIN D 25 Hydroxy (Vit-D Deficiency, Fractures) - DG Bone Density; Future  Kathlean, it was a pleasure meeting you today!  I will inform you of your results as soon as they are available.   Health Maintenance for Postmenopausal Women Menopause is a normal process in which your reproductive ability comes to an end. This process happens gradually over a span of months to years, usually between the ages of 5 and 1. Menopause is complete when you have missed 12 consecutive menstrual periods. It is important to talk with your health care provider about some of the most common conditions that affect postmenopausal women, such as heart disease, cancer, and bone loss (osteoporosis). Adopting a healthy lifestyle and getting preventive care can help to promote your health and wellness. Those actions can also lower your chances of developing some of these common conditions. What should I know about menopause? During menopause, you may experience a number of symptoms, such as:  Moderate-to-severe hot flashes.  Night sweats.  Decrease in sex drive.  Mood  swings.  Headaches.  Tiredness.  Irritability.  Memory problems.  Insomnia.  Choosing to treat or not to treat menopausal changes is an individual decision that you make with your health care provider. What should I know about hormone replacement therapy and supplements? Hormone therapy products are effective for treating symptoms that are associated with menopause, such as hot flashes and night sweats. Hormone replacement carries certain risks, especially as you become older. If you are thinking about using estrogen or estrogen with progestin treatments, discuss the benefits and risks with your health care provider. What should I know about heart disease and stroke? Heart disease, heart attack, and stroke become more likely as you age. This may be due, in part, to the hormonal changes that your body experiences during menopause. These can affect how your body processes dietary fats, triglycerides, and cholesterol. Heart attack and stroke are both medical emergencies. There are many things that you can do to help prevent heart disease and stroke:  Have your blood pressure checked at least every 1-2 years. High blood pressure causes heart disease and increases the risk of stroke.  If you are 85-40 years old, ask your health care provider if you should take aspirin to prevent a heart attack or a stroke.  Do not use any tobacco products, including cigarettes, chewing tobacco, or electronic cigarettes. If you need help quitting, ask your health care provider.  It is important to eat a healthy diet and maintain a healthy weight. ? Be sure to include plenty of vegetables, fruits, low-fat dairy products, and lean protein. ? Avoid eating foods that are high in solid fats, added sugars, or salt (sodium).  Get regular exercise. This is one of the most important  things that you can do for your health. ? Try to exercise for at least 150 minutes each week. The type of exercise that you do should  increase your heart rate and make you sweat. This is known as moderate-intensity exercise. ? Try to do strengthening exercises at least twice each week. Do these in addition to the moderate-intensity exercise.  Know your numbers.Ask your health care provider to check your cholesterol and your blood glucose. Continue to have your blood tested as directed by your health care provider.  What should I know about cancer screening? There are several types of cancer. Take the following steps to reduce your risk and to catch any cancer development as early as possible. Breast Cancer  Practice breast self-awareness. ? This means understanding how your breasts normally appear and feel. ? It also means doing regular breast self-exams. Let your health care provider know about any changes, no matter how small.  If you are 65 or older, have a clinician do a breast exam (clinical breast exam or CBE) every year. Depending on your age, family history, and medical history, it may be recommended that you also have a yearly breast X-ray (mammogram).  If you have a family history of breast cancer, talk with your health care provider about genetic screening.  If you are at high risk for breast cancer, talk with your health care provider about having an MRI and a mammogram every year.  Breast cancer (BRCA) gene test is recommended for women who have family members with BRCA-related cancers. Results of the assessment will determine the need for genetic counseling and BRCA1 and for BRCA2 testing. BRCA-related cancers include these types: ? Breast. This occurs in males or females. ? Ovarian. ? Tubal. This may also be called fallopian tube cancer. ? Cancer of the abdominal or pelvic lining (peritoneal cancer). ? Prostate. ? Pancreatic.  Cervical, Uterine, and Ovarian Cancer Your health care provider may recommend that you be screened regularly for cancer of the pelvic organs. These include your ovaries, uterus,  and vagina. This screening involves a pelvic exam, which includes checking for microscopic changes to the surface of your cervix (Pap test).  For women ages 21-65, health care providers may recommend a pelvic exam and a Pap test every three years. For women ages 47-65, they may recommend the Pap test and pelvic exam, combined with testing for human papilloma virus (HPV), every five years. Some types of HPV increase your risk of cervical cancer. Testing for HPV may also be done on women of any age who have unclear Pap test results.  Other health care providers may not recommend any screening for nonpregnant women who are considered low risk for pelvic cancer and have no symptoms. Ask your health care provider if a screening pelvic exam is right for you.  If you have had past treatment for cervical cancer or a condition that could lead to cancer, you need Pap tests and screening for cancer for at least 20 years after your treatment. If Pap tests have been discontinued for you, your risk factors (such as having a new sexual partner) need to be reassessed to determine if you should start having screenings again. Some women have medical problems that increase the chance of getting cervical cancer. In these cases, your health care provider may recommend that you have screening and Pap tests more often.  If you have a family history of uterine cancer or ovarian cancer, talk with your health care provider about genetic screening.  If you have vaginal bleeding after reaching menopause, tell your health care provider.  There are currently no reliable tests available to screen for ovarian cancer.  Lung Cancer Lung cancer screening is recommended for adults 74-49 years old who are at high risk for lung cancer because of a history of smoking. A yearly low-dose CT scan of the lungs is recommended if you:  Currently smoke.  Have a history of at least 30 pack-years of smoking and you currently smoke or have quit  within the past 15 years. A pack-year is smoking an average of one pack of cigarettes per day for one year.  Yearly screening should:  Continue until it has been 15 years since you quit.  Stop if you develop a health problem that would prevent you from having lung cancer treatment.  Colorectal Cancer  This type of cancer can be detected and can often be prevented.  Routine colorectal cancer screening usually begins at age 46 and continues through age 66.  If you have risk factors for colon cancer, your health care provider may recommend that you be screened at an earlier age.  If you have a family history of colorectal cancer, talk with your health care provider about genetic screening.  Your health care provider may also recommend using home test kits to check for hidden blood in your stool.  A small camera at the end of a tube can be used to examine your colon directly (sigmoidoscopy or colonoscopy). This is done to check for the earliest forms of colorectal cancer.  Direct examination of the colon should be repeated every 5-10 years until age 9. However, if early forms of precancerous polyps or small growths are found or if you have a family history or genetic risk for colorectal cancer, you may need to be screened more often.  Skin Cancer  Check your skin from head to toe regularly.  Monitor any moles. Be sure to tell your health care provider: ? About any new moles or changes in moles, especially if there is a change in a mole's shape or color. ? If you have a mole that is larger than the size of a pencil eraser.  If any of your family members has a history of skin cancer, especially at a young age, talk with your health care provider about genetic screening.  Always use sunscreen. Apply sunscreen liberally and repeatedly throughout the day.  Whenever you are outside, protect yourself by wearing long sleeves, pants, a wide-brimmed hat, and sunglasses.  What should I know  about osteoporosis? Osteoporosis is a condition in which bone destruction happens more quickly than new bone creation. After menopause, you may be at an increased risk for osteoporosis. To help prevent osteoporosis or the bone fractures that can happen because of osteoporosis, the following is recommended:  If you are 88-80 years old, get at least 1,000 mg of calcium and at least 600 mg of vitamin D per day.  If you are older than age 17 but younger than age 83, get at least 1,200 mg of calcium and at least 600 mg of vitamin D per day.  If you are older than age 57, get at least 1,200 mg of calcium and at least 800 mg of vitamin D per day.  Smoking and excessive alcohol intake increase the risk of osteoporosis. Eat foods that are rich in calcium and vitamin D, and do weight-bearing exercises several times each week as directed by your health care provider. What should  I know about how menopause affects my mental health? Depression may occur at any age, but it is more common as you become older. Common symptoms of depression include:  Low or sad mood.  Changes in sleep patterns.  Changes in appetite or eating patterns.  Feeling an overall lack of motivation or enjoyment of activities that you previously enjoyed.  Frequent crying spells.  Talk with your health care provider if you think that you are experiencing depression. What should I know about immunizations? It is important that you get and maintain your immunizations. These include:  Tetanus, diphtheria, and pertussis (Tdap) booster vaccine.  Influenza every year before the flu season begins.  Pneumonia vaccine.  Shingles vaccine.  Your health care provider may also recommend other immunizations. This information is not intended to replace advice given to you by your health care provider. Make sure you discuss any questions you have with your health care provider. Document Released: 08/25/2005 Document Revised: 01/21/2016  Document Reviewed: 04/06/2015 Elsevier Interactive Patient Education  2018 [employer].

## 2017-10-09 ENCOUNTER — Other Ambulatory Visit: Payer: Self-pay | Admitting: Obstetrics & Gynecology

## 2017-10-09 DIAGNOSIS — E559 Vitamin D deficiency, unspecified: Secondary | ICD-10-CM

## 2017-10-09 MED ORDER — VITAMIN D (ERGOCALCIFEROL) 1.25 MG (50000 UNIT) PO CAPS: 50000.0000 [IU] | ORAL_CAPSULE | ORAL | 0 refills | Status: DC

## 2017-10-09 NOTE — Telephone Encounter (Signed)
Pt scheduled on 12/04/17

## 2017-10-17 MED FILL — NovoLOG 100 UNIT/ML SOLN: 100 | 38 days supply | Qty: 40 | Fill #1

## 2017-10-23 ENCOUNTER — Ambulatory Visit: Payer: Self-pay | Admitting: Gynecology

## 2017-10-30 ENCOUNTER — Other Ambulatory Visit: Payer: Self-pay | Admitting: Physician Assistant

## 2017-10-30 DIAGNOSIS — Z139 Encounter for screening, unspecified: Secondary | ICD-10-CM

## 2017-11-06 ENCOUNTER — Other Ambulatory Visit: Payer: Self-pay | Admitting: Gynecology

## 2017-11-06 DIAGNOSIS — Z8639 Personal history of other endocrine, nutritional and metabolic disease: Secondary | ICD-10-CM

## 2017-11-06 DIAGNOSIS — M818 Other osteoporosis without current pathological fracture: Secondary | ICD-10-CM

## 2017-11-14 DIAGNOSIS — M810 Age-related osteoporosis without current pathological fracture: Secondary | ICD-10-CM

## 2017-11-14 HISTORY — DX: Age-related osteoporosis without current pathological fracture: M81.0

## 2017-11-16 ENCOUNTER — Telehealth: Payer: Self-pay | Admitting: *Deleted

## 2017-11-16 NOTE — Telephone Encounter (Signed)
Insulin letter sent electronically to Roe Coombs, PA-C as well as faxed manually. We will await response.

## 2017-11-16 NOTE — Telephone Encounter (Signed)
Patient is has insulin pump. She is scheduled for direct screening colonoscopy with Dr.Pyrtle on 12/18/17. Please send to PCP for instructions. Thank you, Briseida Gittings pv

## 2017-11-20 NOTE — Telephone Encounter (Signed)
I have spoken to patient to advise that we have received response from Roe Coombs, PA-C regarding insulin pump stating "liquids need to have calories day of procedure, no bolus insulin, stay on basal rate."  Patient verbalizes understanding.  Of note: see original response under "Media" tab in EPIC system.

## 2017-11-21 ENCOUNTER — Telehealth: Payer: Self-pay | Admitting: *Deleted

## 2017-11-21 NOTE — Telephone Encounter (Signed)
See 11/16/17 telephone note.

## 2017-11-21 NOTE — Telephone Encounter (Signed)
Tina [name],  This pt is coming in for a PV 5-21 for a colon 6-4.  She has an insulin pump.  Can you please send a letter for adjustments for this . Thanks, [name] PV

## 2017-11-21 NOTE — Telephone Encounter (Signed)
Thanks, sorry I missed this - thanks [employer]

## 2017-11-28 ENCOUNTER — Other Ambulatory Visit: Payer: Self-pay | Admitting: Gynecology

## 2017-11-28 ENCOUNTER — Ambulatory Visit: Payer: Medicare HMO

## 2017-11-28 DIAGNOSIS — M810 Age-related osteoporosis without current pathological fracture: Secondary | ICD-10-CM | POA: Diagnosis not present

## 2017-11-28 DIAGNOSIS — Z8639 Personal history of other endocrine, nutritional and metabolic disease: Secondary | ICD-10-CM

## 2017-11-28 DIAGNOSIS — M818 Other osteoporosis without current pathological fracture: Secondary | ICD-10-CM

## 2017-11-28 DIAGNOSIS — Q969 Turner's syndrome, unspecified: Secondary | ICD-10-CM | POA: Diagnosis not present

## 2017-11-29 ENCOUNTER — Telehealth: Payer: Self-pay | Admitting: Gynecology

## 2017-11-29 ENCOUNTER — Encounter: Payer: Self-pay | Admitting: Gynecology

## 2017-11-29 NOTE — Telephone Encounter (Signed)
Patient called with a report that her new sensor glucose (Free Style Libre) 14 day  meter in her arm stopped working after her DEXA scan . The manufacturer did not state that it was a problem but she will call her rep regarding this and will call back . She stated that they replaced this sensor for her at no cost. She will call back if this sensor is affected by x ray.  She stated that her insulin pump was fine after her bone density exam.  Patient left her infusion pump  on and placed it on the table away from the x ray tube.

## 2017-11-29 NOTE — Telephone Encounter (Signed)
Patient's bone density shows osteoporosis.  She needs an appointment with Dr Dellis Filbert to discuss treatment options.

## 2017-11-30 ENCOUNTER — Ambulatory Visit
Admission: RE | Admit: 2017-11-30 | Discharge: 2017-11-30 | Disposition: A | Discharge status: Home / Self Care | Payer: Medicare HMO | Source: Ambulatory Visit | Attending: Physician Assistant | Admitting: Physician Assistant

## 2017-11-30 ENCOUNTER — Ambulatory Visit: Payer: Medicare HMO

## 2017-11-30 DIAGNOSIS — Z139 Encounter for screening, unspecified: Secondary | ICD-10-CM

## 2017-11-30 NOTE — Telephone Encounter (Signed)
patient informed, will call back

## 2017-12-04 ENCOUNTER — Ambulatory Visit (AMBULATORY_SURGERY_CENTER): Payer: Self-pay

## 2017-12-04 ENCOUNTER — Other Ambulatory Visit: Payer: Self-pay

## 2017-12-04 VITALS — Ht <= 58 in | Wt 150.4 lb

## 2017-12-04 DIAGNOSIS — Z1211 Encounter for screening for malignant neoplasm of colon: Secondary | ICD-10-CM

## 2017-12-04 MED ORDER — PEG 3350-KCL-NA BICARB-NACL 420 G PO SOLR: 4000.0000 mL | Freq: Once | ORAL | 0 refills | Status: AC

## 2017-12-04 NOTE — Progress Notes (Signed)
No egg or soy allergy known to patient  No issues with past sedation with any surgeries  or procedures, no intubation problems  No diet pills per patient No home 02 use per patient  No blood thinners per patient  Pt denies issues with constipation  No A fib or A flutter  EMMI video sent to pt's e mail , pt declined    

## 2017-12-05 ENCOUNTER — Encounter: Payer: Self-pay | Admitting: Internal Medicine

## 2017-12-18 ENCOUNTER — Encounter: Payer: Self-pay | Admitting: Internal Medicine

## 2017-12-18 ENCOUNTER — Other Ambulatory Visit: Payer: Self-pay

## 2017-12-18 ENCOUNTER — Ambulatory Visit (AMBULATORY_SURGERY_CENTER): Payer: Medicare HMO | Admitting: Internal Medicine

## 2017-12-18 VITALS — BP 114/70 | HR 86 | Temp 99.1°F | Resp 15 | Ht <= 58 in | Wt 151.0 lb

## 2017-12-18 DIAGNOSIS — D124 Benign neoplasm of descending colon: Secondary | ICD-10-CM | POA: Diagnosis not present

## 2017-12-18 DIAGNOSIS — D122 Benign neoplasm of ascending colon: Secondary | ICD-10-CM

## 2017-12-18 DIAGNOSIS — Z1211 Encounter for screening for malignant neoplasm of colon: Secondary | ICD-10-CM

## 2017-12-18 DIAGNOSIS — D120 Benign neoplasm of cecum: Secondary | ICD-10-CM

## 2017-12-18 DIAGNOSIS — D125 Benign neoplasm of sigmoid colon: Secondary | ICD-10-CM

## 2017-12-18 DIAGNOSIS — D123 Benign neoplasm of transverse colon: Secondary | ICD-10-CM

## 2017-12-18 MED ORDER — SODIUM CHLORIDE 0.9 % IV SOLN: 500.0000 mL | Freq: Once | INTRAVENOUS | Status: DC

## 2017-12-18 NOTE — Patient Instructions (Signed)
YOU HAD AN ENDOSCOPIC PROCEDURE TODAY AT [facility]:   Refer to the procedure report that was given to you for any specific questions about what was found during the examination.  If the procedure report does not answer your questions, please call your gastroenterologist to clarify.  If you requested that your care partner not be given the details of your procedure findings, then the procedure report has been included in a sealed envelope for you to review at your convenience later.  YOU SHOULD EXPECT: Some feelings of bloating in the abdomen. Passage of more gas than usual.  Walking can help get rid of the air that was put into your GI tract during the procedure and reduce the bloating. If you had a lower endoscopy (such as a colonoscopy or flexible sigmoidoscopy) you may notice spotting of blood in your stool or on the toilet paper. If you underwent a bowel prep for your procedure, you may not have a normal bowel movement for a few days.  Please Note:  You might notice some irritation and congestion in your nose or some drainage.  This is from the oxygen used during your procedure.  There is no need for concern and it should clear up in a day or so.  SYMPTOMS TO REPORT IMMEDIATELY:   Following lower endoscopy (colonoscopy or flexible sigmoidoscopy):  Excessive amounts of blood in the stool  Significant tenderness or worsening of abdominal pains  Swelling of the abdomen that is new, acute  Fever of 100F or higher   Following upper endoscopy (EGD)  Vomiting of blood or coffee ground material  New chest pain or pain under the shoulder blades  Painful or persistently difficult swallowing  New shortness of breath  Fever of 100F or higher  Black, tarry-looking stools  For urgent or emergent issues, a gastroenterologist can be reached at any hour by calling [phone].   DIET:  We do recommend a small meal at first, but then you may proceed to your regular diet.  Drink  plenty of fluids but you should avoid alcoholic beverages for 24 hours.  ACTIVITY:  You should plan to take it easy for the rest of today and you should NOT DRIVE or use heavy machinery until tomorrow (because of the sedation medicines used during the test).    FOLLOW UP: Our staff will call the number listed on your records the next business day following your procedure to check on you and address any questions or concerns that you may have regarding the information given to you following your procedure. If we do not reach you, we will leave a message.  However, if you are feeling well and you are not experiencing any problems, there is no need to return our call.  We will assume that you have returned to your regular daily activities without incident.  If any biopsies were taken you will be contacted by phone or by letter within the next 1-3 weeks.  Please call us at [phone] if you have not heard about the biopsies in 3 weeks.    SIGNATURES/CONFIDENTIALITY: You and/or your care partner have signed paperwork which will be entered into your electronic medical record.  These signatures attest to the fact that that the information above on your After Visit Summary has been reviewed and is understood.  Full responsibility of the confidentiality of this discharge information lies with you and/or your care-partner.  Diverticulosis and polyp infomation given.

## 2017-12-18 NOTE — Op Note (Signed)
[facility] Patient Name: Tina [name] Procedure Date: 12/18/2017 10:07 AM MRN: [id number] Endoscopist: Jerene Bears , MD Age: 52 Referring MD:  Date of Birth: [birth year] Gender: Female Account #: [id number] Procedure:                Colonoscopy Indications:              Screening for colorectal malignant neoplasm, This                            is the patient's first colonoscopy Medicines:                Monitored Anesthesia Care Procedure:                Pre-Anesthesia Assessment:                           - Prior to the procedure, a History and Physical                            was performed, and patient medications and                            allergies were reviewed. The patient's tolerance of                            previous anesthesia was also reviewed. The risks                            and benefits of the procedure and the sedation                            options and risks were discussed with the patient.                            All questions were answered, and informed consent                            was obtained. Prior Anticoagulants: The patient has                            taken no previous anticoagulant or antiplatelet                            agents. ASA Grade Assessment: III - A patient with                            severe systemic disease. After reviewing the risks                            and benefits, the patient was deemed in                            satisfactory condition to undergo the procedure.  After obtaining informed consent, the colonoscope                            was passed under direct vision. Throughout the                            procedure, the patient's blood pressure, pulse, and                            oxygen saturations were monitored continuously. The                            Model PCF-H190DL [id number]) scope was introduced                            through the anus  and advanced to the the                            colocolonic anastomosis created status post left                            colectomy. The colonoscopy was performed without                            difficulty. The patient tolerated the procedure                            well. The quality of the bowel preparation was good                            except for the cecum which was fair. In the cecal                            base there was semi-solid stool that could not be                            completed cleared with copious irrigation and                            lavage. The ileocecal valve, appendiceal orifice,                            and rectum were photographed. Scope In: 10:11:20 AM Scope Out: 10:34:04 AM Scope Withdrawal Time: 0 hours 18 minutes 40 seconds  Total Procedure Duration: 0 hours 22 minutes 44 seconds  Findings:                 The digital rectal exam was normal.                           A 6 mm polyp was found in the cecum. The polyp was                            sessile. The polyp  was removed with a cold snare.                            Resection and retrieval were complete.                           A 5 mm polyp was found in the ascending colon. The                            polyp was sessile. The polyp was removed with a                            cold snare. Resection and retrieval were complete.                           Three sessile polyps were found in the transverse                            colon. The polyps were 2 to 5 mm in size. These                            polyps were removed with a cold snare. Resection                            and retrieval were complete.                           A 10 mm polyp was found in the descending colon.                            The polyp was semi-pedunculated. The polyp was                            removed with a cold snare. Resection and retrieval                            were complete.                            A 6 mm polyp was found in the sigmoid colon. The                            polyp was sessile. The polyp was removed with a                            cold snare. Resection and retrieval were complete.                           Scattered small-mouthed diverticula were found in                            the sigmoid colon, descending colon and ascending  colon.                           Internal hemorrhoids were found during                            retroflexion. The hemorrhoids were small. Complications:            No immediate complications. Estimated Blood Loss:     Estimated blood loss was minimal. Impression:               - One 6 mm polyp in the cecum, removed with a cold                            snare. Resected and retrieved.                           - One 5 mm polyp in the ascending colon, removed                            with a cold snare. Resected and retrieved.                           - Three 2 to 5 mm polyps in the transverse colon,                            removed with a cold snare. Resected and retrieved.                           - One 10 mm polyp in the descending colon, removed                            with a cold snare. Resected and retrieved.                           - One 6 mm polyp in the sigmoid colon, removed with                            a cold snare. Resected and retrieved.                           - Mild diverticulosis in the sigmoid colon, in the                            descending colon and in the ascending colon.                           - Internal hemorrhoids. Recommendation:           - Patient has a contact number available for                            emergencies. The signs and symptoms of potential  delayed complications were discussed with the                            patient. Return to normal activities tomorrow.                            Written discharge  instructions were provided to the                            patient.                           - Resume previous diet.                           - Continue present medications.                           - Await pathology results.                           - Repeat colonoscopy is recommended for                            surveillance. The colonoscopy date will be                            determined after pathology results from today's                            exam become available for review. Jerene Bears, MD 12/18/2017 10:39:43 AM This report has been signed electronically.

## 2017-12-18 NOTE — Progress Notes (Signed)
Called to room to assist during endoscopic procedure.  Patient ID and intended procedure confirmed with present staff. Received instructions for my participation in the procedure from the performing physician.  

## 2017-12-18 NOTE — Progress Notes (Signed)
Pt. Reports no change in her medical or surgical history since her pre-visit 12/04/2017.

## 2017-12-18 NOTE — Progress Notes (Signed)
Report to PACU, RN, vss, BBS= Clear.  

## 2017-12-19 ENCOUNTER — Telehealth: Payer: Self-pay

## 2017-12-19 NOTE — Telephone Encounter (Signed)
  Follow up Call-  Call Tina [name] number 12/18/2017  Post procedure Call Tina [name] phone  # [phone]  Permission to leave phone message Yes  Some recent data might be hidden     Patient questions:  Do you have a fever, pain , or abdominal swelling? No. Pain Score  0 *  Have you tolerated food without any problems? Yes.    Have you been able to return to your normal activities? Yes.    Do you have any questions about your discharge instructions: Diet   No. Medications  No. Follow up visit  No.  Do you have questions or concerns about your Care? No.  Actions: * If pain score is 4 or above: No action needed, pain <4.

## 2017-12-24 ENCOUNTER — Encounter: Payer: Self-pay | Admitting: Internal Medicine

## 2018-01-16 MED FILL — NovoLOG 100 UNIT/ML SOLN: 100 | 29 days supply | Qty: 30 | Fill #1

## 2018-02-05 ENCOUNTER — Other Ambulatory Visit: Payer: Self-pay

## 2018-02-05 ENCOUNTER — Other Ambulatory Visit: Payer: Self-pay | Admitting: Cardiovascular Disease

## 2018-02-05 DIAGNOSIS — R002 Palpitations: Secondary | ICD-10-CM

## 2018-02-06 MED ORDER — DILTIAZEM HCL ER COATED BEADS 120 MG PO CP24
120.0000 mg | ORAL_CAPSULE | Freq: Every day | ORAL | 6 refills | Status: DC
Start: 2018-02-06 — End: 2018-02-06

## 2018-02-06 MED ORDER — DILTIAZEM HCL ER COATED BEADS 120 MG PO CP24
120.0000 mg | ORAL_CAPSULE | Freq: Every day | ORAL | 1 refills | Status: DC
Start: 2018-02-06 — End: 2018-06-14

## 2018-02-06 NOTE — Telephone Encounter (Signed)
OK to refill cardizem. I spoke with [employer] and Rosuvastatin has previously been prescribed by primary care Roe Coombs PA). Lipid profile last checked by primary care.  Please send refill for Rosuvastatin to primary care.

## 2018-02-06 NOTE — Addendum Note (Signed)
Addended by: Mady Haagensen on: 02/06/2018 11:43 AM   Modules accepted: Orders

## 2018-02-08 ENCOUNTER — Other Ambulatory Visit: Payer: Self-pay | Admitting: Obstetrics & Gynecology

## 2018-02-11 ENCOUNTER — Other Ambulatory Visit: Payer: Medicare HMO

## 2018-02-11 DIAGNOSIS — E559 Vitamin D deficiency, unspecified: Secondary | ICD-10-CM

## 2018-02-12 ENCOUNTER — Other Ambulatory Visit: Payer: Self-pay | Admitting: Obstetrics & Gynecology

## 2018-02-12 DIAGNOSIS — E559 Vitamin D deficiency, unspecified: Secondary | ICD-10-CM

## 2018-02-12 LAB — VITAMIN D 25 HYDROXY (VIT D DEFICIENCY, FRACTURES): Vit D, 25-Hydroxy: 24 ng/mL — ABNORMAL LOW (ref 30–100)

## 2018-02-12 MED ORDER — VITAMIN D (ERGOCALCIFEROL) 1.25 MG (50000 UNIT) PO CAPS: 50000.0000 [IU] | ORAL_CAPSULE | ORAL | 0 refills | Status: DC

## 2018-03-11 MED FILL — NovoLOG 100 UNIT/ML SOLN: 100 | 38 days supply | Qty: 40 | Fill #0

## 2018-04-22 DIAGNOSIS — H11431 Conjunctival hyperemia, right eye: Secondary | ICD-10-CM | POA: Diagnosis not present

## 2018-04-22 DIAGNOSIS — Z9001 Acquired absence of eye: Secondary | ICD-10-CM | POA: Diagnosis not present

## 2018-05-06 DIAGNOSIS — E669 Obesity, unspecified: Secondary | ICD-10-CM | POA: Diagnosis not present

## 2018-05-06 DIAGNOSIS — Q669 Congenital deformity of feet, unspecified, unspecified foot: Secondary | ICD-10-CM | POA: Diagnosis not present

## 2018-05-06 DIAGNOSIS — Q969 Turner's syndrome, unspecified: Secondary | ICD-10-CM | POA: Diagnosis not present

## 2018-05-06 DIAGNOSIS — K635 Polyp of colon: Secondary | ICD-10-CM | POA: Diagnosis not present

## 2018-05-06 DIAGNOSIS — I10 Essential (primary) hypertension: Secondary | ICD-10-CM | POA: Diagnosis not present

## 2018-05-06 DIAGNOSIS — Z7189 Other specified counseling: Secondary | ICD-10-CM | POA: Diagnosis not present

## 2018-05-06 DIAGNOSIS — D134 Benign neoplasm of liver: Secondary | ICD-10-CM | POA: Diagnosis not present

## 2018-05-06 DIAGNOSIS — H9193 Unspecified hearing loss, bilateral: Secondary | ICD-10-CM | POA: Diagnosis not present

## 2018-05-06 DIAGNOSIS — E103553 Type 1 diabetes mellitus with stable proliferative diabetic retinopathy, bilateral: Secondary | ICD-10-CM | POA: Diagnosis not present

## 2018-05-07 DIAGNOSIS — H543 Unqualified visual loss, both eyes: Secondary | ICD-10-CM | POA: Diagnosis not present

## 2018-05-07 DIAGNOSIS — E113599 Type 2 diabetes mellitus with proliferative diabetic retinopathy without macular edema, unspecified eye: Secondary | ICD-10-CM | POA: Diagnosis not present

## 2018-05-07 DIAGNOSIS — Z961 Presence of intraocular lens: Secondary | ICD-10-CM | POA: Diagnosis not present

## 2018-05-07 DIAGNOSIS — H538 Other visual disturbances: Secondary | ICD-10-CM | POA: Diagnosis not present

## 2018-05-20 MED FILL — NovoLOG 100 UNIT/ML SOLN: 100 | 37 days supply | Qty: 40 | Fill #1

## 2018-06-05 ENCOUNTER — Encounter: Payer: Medicare HMO | Attending: Family Medicine | Admitting: *Deleted

## 2018-06-05 DIAGNOSIS — D134 Benign neoplasm of liver: Secondary | ICD-10-CM | POA: Diagnosis not present

## 2018-06-05 DIAGNOSIS — Z794 Long term (current) use of insulin: Secondary | ICD-10-CM

## 2018-06-05 DIAGNOSIS — Q669 Congenital deformity of feet, unspecified, unspecified foot: Secondary | ICD-10-CM | POA: Diagnosis not present

## 2018-06-05 DIAGNOSIS — H9193 Unspecified hearing loss, bilateral: Secondary | ICD-10-CM | POA: Diagnosis not present

## 2018-06-05 DIAGNOSIS — IMO0001 Reserved for inherently not codable concepts without codable children: Secondary | ICD-10-CM

## 2018-06-05 DIAGNOSIS — E103553 Type 1 diabetes mellitus with stable proliferative diabetic retinopathy, bilateral: Secondary | ICD-10-CM | POA: Insufficient documentation

## 2018-06-05 DIAGNOSIS — E119 Type 2 diabetes mellitus without complications: Secondary | ICD-10-CM

## 2018-06-05 DIAGNOSIS — E785 Hyperlipidemia, unspecified: Secondary | ICD-10-CM | POA: Diagnosis not present

## 2018-06-05 DIAGNOSIS — Q969 Turner's syndrome, unspecified: Secondary | ICD-10-CM | POA: Diagnosis not present

## 2018-06-05 DIAGNOSIS — E669 Obesity, unspecified: Secondary | ICD-10-CM | POA: Diagnosis not present

## 2018-06-05 DIAGNOSIS — I10 Essential (primary) hypertension: Secondary | ICD-10-CM | POA: Diagnosis not present

## 2018-06-11 ENCOUNTER — Other Ambulatory Visit: Payer: Self-pay

## 2018-06-11 ENCOUNTER — Encounter (HOSPITAL_COMMUNITY): Payer: Self-pay

## 2018-06-11 ENCOUNTER — Emergency Department (HOSPITAL_COMMUNITY): Payer: Medicare HMO

## 2018-06-11 ENCOUNTER — Inpatient Hospital Stay (HOSPITAL_COMMUNITY)
Admission: EM | Admit: 2018-06-11 | Discharge: 2018-06-14 | DRG: 310 | Disposition: A | Discharge status: Home Health | Payer: Medicare HMO | Attending: Internal Medicine | Admitting: Internal Medicine

## 2018-06-11 DIAGNOSIS — H9190 Unspecified hearing loss, unspecified ear: Secondary | ICD-10-CM | POA: Diagnosis present

## 2018-06-11 DIAGNOSIS — Z87891 Personal history of nicotine dependence: Secondary | ICD-10-CM | POA: Diagnosis not present

## 2018-06-11 DIAGNOSIS — N2889 Other specified disorders of kidney and ureter: Secondary | ICD-10-CM | POA: Diagnosis not present

## 2018-06-11 DIAGNOSIS — E1022 Type 1 diabetes mellitus with diabetic chronic kidney disease: Secondary | ICD-10-CM | POA: Diagnosis not present

## 2018-06-11 DIAGNOSIS — R112 Nausea with vomiting, unspecified: Secondary | ICD-10-CM | POA: Diagnosis not present

## 2018-06-11 DIAGNOSIS — E119 Type 2 diabetes mellitus without complications: Secondary | ICD-10-CM

## 2018-06-11 DIAGNOSIS — E1043 Type 1 diabetes mellitus with diabetic autonomic (poly)neuropathy: Secondary | ICD-10-CM | POA: Diagnosis present

## 2018-06-11 DIAGNOSIS — M810 Age-related osteoporosis without current pathological fracture: Secondary | ICD-10-CM | POA: Diagnosis present

## 2018-06-11 DIAGNOSIS — I4891 Unspecified atrial fibrillation: Secondary | ICD-10-CM | POA: Diagnosis present

## 2018-06-11 DIAGNOSIS — Z888 Allergy status to other drugs, medicaments and biological substances status: Secondary | ICD-10-CM

## 2018-06-11 DIAGNOSIS — R42 Dizziness and giddiness: Secondary | ICD-10-CM | POA: Diagnosis not present

## 2018-06-11 DIAGNOSIS — N182 Chronic kidney disease, stage 2 (mild): Secondary | ICD-10-CM | POA: Diagnosis present

## 2018-06-11 DIAGNOSIS — E860 Dehydration: Secondary | ICD-10-CM | POA: Diagnosis not present

## 2018-06-11 DIAGNOSIS — D72829 Elevated white blood cell count, unspecified: Secondary | ICD-10-CM | POA: Diagnosis present

## 2018-06-11 DIAGNOSIS — I129 Hypertensive chronic kidney disease with stage 1 through stage 4 chronic kidney disease, or unspecified chronic kidney disease: Secondary | ICD-10-CM | POA: Diagnosis present

## 2018-06-11 DIAGNOSIS — E10319 Type 1 diabetes mellitus with unspecified diabetic retinopathy without macular edema: Secondary | ICD-10-CM | POA: Diagnosis not present

## 2018-06-11 DIAGNOSIS — Z794 Long term (current) use of insulin: Secondary | ICD-10-CM

## 2018-06-11 DIAGNOSIS — E785 Hyperlipidemia, unspecified: Secondary | ICD-10-CM | POA: Diagnosis present

## 2018-06-11 DIAGNOSIS — E10649 Type 1 diabetes mellitus with hypoglycemia without coma: Secondary | ICD-10-CM | POA: Diagnosis not present

## 2018-06-11 DIAGNOSIS — Z79899 Other long term (current) drug therapy: Secondary | ICD-10-CM

## 2018-06-11 DIAGNOSIS — J189 Pneumonia, unspecified organism: Secondary | ICD-10-CM

## 2018-06-11 DIAGNOSIS — A084 Viral intestinal infection, unspecified: Secondary | ICD-10-CM | POA: Diagnosis present

## 2018-06-11 DIAGNOSIS — Q969 Turner's syndrome, unspecified: Secondary | ICD-10-CM | POA: Diagnosis not present

## 2018-06-11 DIAGNOSIS — Z599 Problem related to housing and economic circumstances, unspecified: Secondary | ICD-10-CM

## 2018-06-11 DIAGNOSIS — R7989 Other specified abnormal findings of blood chemistry: Secondary | ICD-10-CM

## 2018-06-11 DIAGNOSIS — K3184 Gastroparesis: Secondary | ICD-10-CM | POA: Diagnosis present

## 2018-06-11 DIAGNOSIS — I480 Paroxysmal atrial fibrillation: Secondary | ICD-10-CM | POA: Diagnosis not present

## 2018-06-11 DIAGNOSIS — H548 Legal blindness, as defined in USA: Secondary | ICD-10-CM | POA: Diagnosis present

## 2018-06-11 DIAGNOSIS — I10 Essential (primary) hypertension: Secondary | ICD-10-CM | POA: Diagnosis present

## 2018-06-11 DIAGNOSIS — R197 Diarrhea, unspecified: Secondary | ICD-10-CM | POA: Diagnosis not present

## 2018-06-11 DIAGNOSIS — Z970 Presence of artificial eye: Secondary | ICD-10-CM | POA: Diagnosis not present

## 2018-06-11 DIAGNOSIS — Z9641 Presence of insulin pump (external) (internal): Secondary | ICD-10-CM | POA: Diagnosis present

## 2018-06-11 DIAGNOSIS — IMO0001 Reserved for inherently not codable concepts without codable children: Secondary | ICD-10-CM

## 2018-06-11 DIAGNOSIS — R4020 Unspecified coma: Secondary | ICD-10-CM | POA: Diagnosis not present

## 2018-06-11 DIAGNOSIS — Z8669 Personal history of other diseases of the nervous system and sense organs: Secondary | ICD-10-CM

## 2018-06-11 LAB — CBC WITH DIFFERENTIAL/PLATELET
Abs Immature Granulocytes: 0.1 10*3/uL — ABNORMAL HIGH (ref 0.00–0.07)
BASOS ABS: 0.1 10*3/uL (ref 0.0–0.1)
Basophils Relative: 0 %
EOS ABS: 0 10*3/uL (ref 0.0–0.5)
Eosinophils Relative: 0 %
HEMATOCRIT: 43.1 % (ref 36.0–46.0)
Hemoglobin: 14.2 g/dL (ref 12.0–15.0)
IMMATURE GRANULOCYTES: 1 %
LYMPHS ABS: 1 10*3/uL (ref 0.7–4.0)
Lymphocytes Relative: 6 %
MCH: 30.5 pg (ref 26.0–34.0)
MCHC: 32.9 g/dL (ref 30.0–36.0)
MCV: 92.7 fL (ref 80.0–100.0)
Monocytes Absolute: 0.7 10*3/uL (ref 0.1–1.0)
Monocytes Relative: 4 %
NEUTROS PCT: 89 %
NRBC: 0 % (ref 0.0–0.2)
Neutro Abs: 14.5 10*3/uL — ABNORMAL HIGH (ref 1.7–7.7)
PLATELETS: 262 10*3/uL (ref 150–400)
RBC: 4.65 MIL/uL (ref 3.87–5.11)
RDW: 13.2 % (ref 11.5–15.5)
WBC: 16.3 10*3/uL — AB (ref 4.0–10.5)

## 2018-06-11 LAB — COMPREHENSIVE METABOLIC PANEL
ALBUMIN: 3.5 g/dL (ref 3.5–5.0)
ALT: 33 U/L (ref 0–44)
ANION GAP: 11 (ref 5–15)
AST: 23 U/L (ref 15–41)
Alkaline Phosphatase: 123 U/L (ref 38–126)
BILIRUBIN TOTAL: 1.2 mg/dL (ref 0.3–1.2)
BUN: 36 mg/dL — AB (ref 6–20)
CHLORIDE: 108 mmol/L (ref 98–111)
CO2: 21 mmol/L — AB (ref 22–32)
Calcium: 9.1 mg/dL (ref 8.9–10.3)
Creatinine, Ser: 1.07 mg/dL — ABNORMAL HIGH (ref 0.44–1.00)
GFR calc Af Amer: >60 mL/min (ref >60)
GFR calc non Af Amer: 60 mL/min — ABNORMAL LOW (ref >60)
GLUCOSE: 257 mg/dL — AB (ref 70–99)
POTASSIUM: 4.2 mmol/L (ref 3.5–5.1)
SODIUM: 140 mmol/L (ref 135–145)
Total Protein: 7.3 g/dL (ref 6.5–8.1)

## 2018-06-11 LAB — URINALYSIS, ROUTINE W REFLEX MICROSCOPIC
BACTERIA UA: NONE SEEN
BILIRUBIN URINE: NEGATIVE
Glucose, UA: >=500 mg/dL — AB
Hgb urine dipstick: NEGATIVE
KETONES UR: 20 mg/dL — AB
LEUKOCYTES UA: NEGATIVE
NITRITE: NEGATIVE
PH: 5 (ref 5.0–8.0)
PROTEIN: 100 mg/dL — AB
Specific Gravity, Urine: 1.013 (ref 1.005–1.030)

## 2018-06-11 LAB — BLOOD GAS, VENOUS
ACID-BASE DEFICIT: 4.6 mmol/L — AB (ref 0.0–2.0)
BICARBONATE: 21.1 mmol/L (ref 20.0–28.0)
O2 Saturation: 64.2 %
PATIENT TEMPERATURE: 98.6
PH VEN: 7.309 (ref 7.250–7.430)
pCO2, Ven: 43.3 mmHg — ABNORMAL LOW (ref 44.0–60.0)
pO2, Ven: 38.5 mmHg (ref 32.0–45.0)

## 2018-06-11 LAB — TROPONIN I: Troponin I: 0.03 ng/mL (ref <0.03)

## 2018-06-11 LAB — LIPASE, BLOOD: Lipase: 23 U/L (ref 11–51)

## 2018-06-11 LAB — APTT: APTT: 27 s (ref 24–36)

## 2018-06-11 LAB — PROTIME-INR
INR: 0.89
Prothrombin Time: 12 seconds (ref 11.4–15.2)

## 2018-06-11 LAB — GLUCOSE, CAPILLARY: GLUCOSE-CAPILLARY: 357 mg/dL — AB (ref 70–99)

## 2018-06-11 LAB — HEPARIN LEVEL (UNFRACTIONATED): Heparin Unfractionated: 0.7 IU/mL (ref 0.30–0.70)

## 2018-06-11 MED ORDER — ROSUVASTATIN CALCIUM 20 MG PO TABS
20.0000 mg | ORAL_TABLET | Freq: Every day | ORAL | Status: DC
  Administered 2018-06-11 – 2018-06-13 (×3): 20 mg via ORAL
  Filled 2018-06-11 (×3): qty 1

## 2018-06-11 MED ORDER — FAMOTIDINE IN NACL 20-0.9 MG/50ML-% IV SOLN
20.0000 mg | Freq: Two times a day (BID) | INTRAVENOUS | Status: DC
  Administered 2018-06-11: 20 mg via INTRAVENOUS
  Filled 2018-06-11 (×2): qty 50

## 2018-06-11 MED ORDER — HEPARIN (PORCINE) 25000 UT/250ML-% IV SOLN
850.0000 [IU]/h | INTRAVENOUS | Status: AC
  Administered 2018-06-11 – 2018-06-13 (×2): 850 [IU]/h via INTRAVENOUS
  Filled 2018-06-11 (×2): qty 250

## 2018-06-11 MED ORDER — ACETAMINOPHEN 325 MG PO TABS: 650.0000 mg | ORAL_TABLET | Freq: Four times a day (QID) | ORAL | Status: DC | PRN

## 2018-06-11 MED ORDER — INSULIN ASPART 100 UNIT/ML ~~LOC~~ SOLN: 0.0000 [IU] | Freq: Three times a day (TID) | SUBCUTANEOUS | Status: DC

## 2018-06-11 MED ORDER — ACETAMINOPHEN 650 MG RE SUPP: 650.0000 mg | Freq: Four times a day (QID) | RECTAL | Status: DC | PRN

## 2018-06-11 MED ORDER — SODIUM CHLORIDE 0.9 % IV SOLN
INTRAVENOUS | Status: DC
  Administered 2018-06-11 – 2018-06-13 (×5): via INTRAVENOUS

## 2018-06-11 MED ORDER — INSULIN GLARGINE 100 UNIT/ML ~~LOC~~ SOLN
18.0000 [IU] | Freq: Every day | SUBCUTANEOUS | Status: DC
  Administered 2018-06-11: 18 [IU] via SUBCUTANEOUS
  Filled 2018-06-11 (×2): qty 0.18

## 2018-06-11 MED ORDER — DILTIAZEM LOAD VIA INFUSION
20.0000 mg | Freq: Once | INTRAVENOUS | Status: AC
  Administered 2018-06-11: 20 mg via INTRAVENOUS
  Filled 2018-06-11: qty 20

## 2018-06-11 MED ORDER — PROMETHAZINE HCL 25 MG/ML IJ SOLN
12.5000 mg | Freq: Once | INTRAMUSCULAR | Status: AC
  Administered 2018-06-11: 12.5 mg via INTRAVENOUS
  Filled 2018-06-11: qty 1

## 2018-06-11 MED ORDER — HEPARIN BOLUS VIA INFUSION
3000.0000 [IU] | Freq: Once | INTRAVENOUS | Status: AC
  Administered 2018-06-11: 3000 [IU] via INTRAVENOUS
  Filled 2018-06-11: qty 3000

## 2018-06-11 MED ORDER — METOPROLOL TARTRATE 5 MG/5ML IV SOLN
5.0000 mg | Freq: Once | INTRAVENOUS | Status: AC
  Administered 2018-06-11: 5 mg via INTRAVENOUS
  Filled 2018-06-11: qty 5

## 2018-06-11 MED ORDER — DILTIAZEM HCL-DEXTROSE 100-5 MG/100ML-% IV SOLN (PREMIX)
5.0000 mg/h | INTRAVENOUS | Status: DC
  Administered 2018-06-11: 15 mg/h via INTRAVENOUS
  Administered 2018-06-11: 5 mg/h via INTRAVENOUS
  Administered 2018-06-12 – 2018-06-13 (×6): 15 mg/h via INTRAVENOUS
  Filled 2018-06-11 (×8): qty 100

## 2018-06-11 MED ORDER — ONDANSETRON HCL 4 MG PO TABS: 4.0000 mg | ORAL_TABLET | Freq: Four times a day (QID) | ORAL | Status: DC | PRN

## 2018-06-11 MED ORDER — SODIUM CHLORIDE 0.9% FLUSH
3.0000 mL | Freq: Two times a day (BID) | INTRAVENOUS | Status: DC
  Administered 2018-06-12 – 2018-06-13 (×2): 3 mL via INTRAVENOUS

## 2018-06-11 MED ORDER — ROSUVASTATIN CALCIUM 20 MG PO TABS: 20.0000 mg | ORAL_TABLET | Freq: Every day | ORAL | Status: DC

## 2018-06-11 MED ORDER — SODIUM CHLORIDE 0.9 % IV BOLUS
1000.0000 mL | Freq: Once | INTRAVENOUS | Status: AC
  Administered 2018-06-11: 1000 mL via INTRAVENOUS

## 2018-06-11 MED ORDER — ONDANSETRON HCL 4 MG/2ML IJ SOLN
4.0000 mg | Freq: Four times a day (QID) | INTRAMUSCULAR | Status: DC | PRN
  Administered 2018-06-12 – 2018-06-14 (×2): 4 mg via INTRAVENOUS
  Filled 2018-06-11 (×2): qty 2

## 2018-06-11 MED ORDER — ALBUTEROL SULFATE (2.5 MG/3ML) 0.083% IN NEBU: 2.5000 mg | INHALATION_SOLUTION | RESPIRATORY_TRACT | Status: DC | PRN

## 2018-06-11 NOTE — ED Notes (Signed)
Date and time results received: 06/11/18 4:28 PM  (use smartphrase ".now" to insert current time)  Test:troponin Critical Value: 0.03  Name of Provider Notified: Leafy Ro RN  Orders Received? Or Actions Taken?:

## 2018-06-11 NOTE — Progress Notes (Signed)
ANTICOAGULATION CONSULT NOTE - Initial Consult  Pharmacy Consult for IV heparin Indication: atrial fibrillation  Allergies  Allergen Reactions  . Acetazolamide Anaphylaxis and Other (See Comments)     Reaction, drop in Blood pressure that caused patient to stay in bed for 5 days, inconherient  Reaction, drop in Blood pressure that caused patient to stay in bed for 5 days, inconherient  . Ace Inhibitors Cough    Patient Measurements:   Heparin Dosing Weight: 68 kg ( from last available visit in Epic on 12/18/2017)   Vital Signs: Temp: 97.8 F (36.6 C) (11/26 1100) Temp Source: Oral (11/26 1100) BP: 135/86 (11/26 1530) Pulse Rate: 118 (11/26 1530)  Labs: Recent Labs    06/11/18 1112  HGB 14.2  HCT 43.1  PLT 262  APTT 27  LABPROT 12.0  INR 0.89  CREATININE 1.07*    CrCl cannot be calculated (Unknown ideal weight.).   Medical History: Past Medical History:  Diagnosis Date  . Arthritis   . Cataract   . Diabetic retinopathy ([place])   . DM (diabetes mellitus), type 1 ([place])   . Dyspnea   . Elevated LFTs   . Hyperlipidemia   . Hypertension   . Legally blind in right eye, as defined in [place]   . Osteoporosis 11/2017   T score -3.8  . Retinal detachment   . Tachycardia   . Turner's syndrome   . Visual loss, bilateral    right eye light perception only and left eye 20/100    Medications:  Scheduled:    Assessment: Pharmacy is consulted to dose heparin in 52 yo female diagnosed with atrial fibrillation. Pt has also been started on diltiazem infusion. No blood thinners PTA.   Of note, no recent weight available. In order to start heparin for the patient, used a listed weight from 12/18/2017 that was 68 kg.  Today, 06/11/18   Baseline labs are WNL: INR is 0.89, PT 12, aPTT 27  Hgb 14.2, plt 262  Scr 1.08 mg/dl     Goal of Therapy:  Heparin level 0.3-0.7 units/ml Monitor platelets by anticoagulation protocol: Yes   Plan:   Start heparin 3000 unit bolus  followed by heparin infusion of 850 units/hr  HL 6 hours after start of heparin  Daily CBC  Monitor for signs and symptoms of bleeding   Royetta Asal, PharmD, BCPS Pager [phone] 06/11/2018 4:04 PM

## 2018-06-11 NOTE — ED Notes (Signed)
Patient noted to be in sinus rhythm. EKG completed.

## 2018-06-11 NOTE — ED Notes (Signed)
Patient transported to CT 

## 2018-06-11 NOTE — ED Provider Notes (Signed)
[facility] DEPT Provider Note   CSN: [id number] Arrival date & time: 06/11/18  1034     History   Chief Complaint Chief Complaint  Patient presents with  . Nausea  . Emesis  . Diarrhea    HPI Tina [name] is a 52 y.o. female.  Pt presents to the ED today with n/v.  The pt is a very poor historian, but started with sx today.  She denies any pain.  EMS gave her 4 mg of zofran which has not helped sx.  Pt is a diabetic, and initial BS by EMS was 178.     Past Medical History:  Diagnosis Date  . Arthritis   . Cataract   . Diabetic retinopathy ([place])   . DM (diabetes mellitus), type 1 ([place])   . Dyspnea   . Elevated LFTs   . Hyperlipidemia   . Hypertension   . Legally blind in right eye, as defined in [place]   . Osteoporosis 11/2017   T score -3.8  . Retinal detachment   . Tachycardia   . Turner's syndrome   . Visual loss, bilateral    right eye light perception only and left eye 20/100    Patient Active Problem List   Diagnosis Date Noted  . Atrial fibrillation with RVR ([place]) 06/11/2018  . IDDM (insulin dependent diabetes mellitus) ([place]) 11/17/2013  . Hypersomnolence 09/18/2013  . Chronic cough 09/18/2013  . Abnormal transaminases 06/04/2013  . Abnormal alkaline phosphatase test 06/04/2013  . PAC (premature atrial contraction) 09/14/2011  . Reflux 08/26/2011  . Hyperglycemia 08/24/2011  . History of retinal detachment 08/24/2011  . Vitreous hemorrhage ([place]) 08/24/2011  . UTI (lower urinary tract infection) 08/24/2011  . HTN (hypertension) 08/24/2011  . Palpitations 10/25/2010    Past Surgical History:  Procedure Laterality Date  . CATARACT EXTRACTION    . CHOLECYSTECTOMY    . Hepatic adenoma removed    . RETINAL DETACHMENT SURGERY    . right eye surgery      3 years ago  . TONSILLECTOMY       OB History    Gravida  0   Para  0   Term  0   Preterm  0   AB  0   Living  0     SAB  0   TAB  0   Ectopic  0   Multiple  0   Live Births  0            Home Medications    Prior to Admission medications   Medication Sig Start Date End Date Taking? Authorizing Provider  diltiazem (CARDIZEM CD) 120 MG 24 hr capsule Take 1 capsule (120 mg total) by mouth daily. 02/06/18  Yes [name], MD  hydrochlorothiazide (HYDRODIURIL) 12.5 MG tablet TAKE 1 TABLET (12.5 MG TOTAL) BY MOUTH DAILY. Patient taking differently: Take 12.5 mg by mouth daily.  02/05/18  Yes [name], MD  Insulin Human (INSULIN PUMP) SOLN Inject 1 each into the skin continuous. Novolog 100 units/ml - basal rate 33 units/day and averaging 55 units/day with meals   Yes [provider]  losartan (COZAAR) 50 MG tablet TAKE 1 TABLET (50 MG TOTAL) BY MOUTH DAILY. 02/05/18  Yes [name], MD  magnesium oxide (MAG-OX) 400 MG tablet Take 400 mg by mouth daily.   Yes [provider]  methocarbamol (ROBAXIN) 500 MG tablet Take 500 mg by mouth 4 (four) times daily as needed for  muscle spasms.  12/11/17  Yes [provider]  rosuvastatin (CRESTOR) 20 MG tablet Take 20 mg by mouth daily.   Yes [provider]  triamcinolone cream (KENALOG) 0.1 % Apply 1 application topically 2 (two) times daily as needed (ezcema). Arms and hands 09/14/17  Yes [provider]  Vitamin D, Ergocalciferol, (DRISDOL) 50000 units CAPS capsule Take 1 capsule (50,000 Units total) by mouth every 7 (seven) days. 02/12/18  Yes Princess Bruins, MD  Continuous Blood Gluc Receiver (FREESTYLE LIBRE READER) DEVI by Does not apply route. 12/21/16   [provider]  Continuous Blood Gluc Sensor ([place]) MISC by Does not apply route. 12/21/16   [provider]  glucose blood (ONE TOUCH ULTRA TEST) test strip USE TO TEST 6 TO 8 TIMES DAILY AS NEEDED 09/17/17   [provider]  glucose blood test strip USE TO TEST 6 TO 8 TIMES DAILY AS NEEDED 07/20/14    [provider]    Family History Family History  Problem Relation Age of Onset  . Cancer Father        kidney  . Diabetes Maternal Grandmother   . Emphysema Paternal Grandfather   . Esophageal cancer Paternal Aunt   . Colon cancer Neg Hx   . Colon polyps Neg Hx   . Rectal cancer Neg Hx   . Stomach cancer Neg Hx     Social History Social History   Tobacco Use  . Smoking status: Former Smoker    Packs/day: 0.20    Years: 0.50    Pack years: 0.10    Types: Cigarettes    Last attempt to quit: 07/17/1988    Years since quitting: 29.9  . Smokeless tobacco: Never Used  . Tobacco comment: many years ago  Substance Use Topics  . Alcohol use: Yes    Comment: social  . Drug use: No     Allergies   Acetazolamide and Ace inhibitors   Review of Systems Review of Systems  Gastrointestinal: Positive for nausea and vomiting.  All other systems reviewed and are negative.    Physical Exam Updated Vital Signs BP (!) 167/85 (BP Location: Right Arm)   Pulse 74   Temp 98.2 F (36.8 C) (Oral)   Resp (!) 22   Ht 4\' 9"  (1.448 m)   Wt 67.8 kg   SpO2 93%   BMI 32.35 kg/m   Physical Exam  Constitutional: She appears well-developed and well-nourished.  HENT:  Head: Normocephalic and atraumatic.  Right Ear: External ear normal.  Left Ear: External ear normal.  Nose: Nose normal.  Mouth/Throat: Mucous membranes are dry.  Eyes:  Prosthetic right eye  Neck: Normal range of motion. Neck supple.  Cardiovascular: An irregularly irregular rhythm present. Tachycardia present.  Pulmonary/Chest: Effort normal and breath sounds normal.  Abdominal: Soft. Bowel sounds are normal.  Musculoskeletal: Normal range of motion.  Neurological: She is alert.  Skin: Skin is warm. Capillary refill takes less than 2 seconds.  Psychiatric: She has a normal mood and affect. Her behavior is normal. Judgment and thought content normal.  Nursing note and vitals reviewed.    ED  Treatments / Results  Labs (all labs ordered are listed, but only abnormal results are displayed) Labs Reviewed  CBC WITH DIFFERENTIAL/PLATELET - Abnormal; Notable for the following components:      Result Value   WBC 16.3 (*)    Neutro Abs 14.5 (*)    Abs Immature Granulocytes 0.10 (*)  All other components within normal limits  COMPREHENSIVE METABOLIC PANEL - Abnormal; Notable for the following components:   CO2 21 (*)    Glucose, Bld 257 (*)    BUN 36 (*)    Creatinine, Ser 1.07 (*)    GFR calc non Af Amer 60 (*)    All other components within normal limits  URINALYSIS, ROUTINE W REFLEX MICROSCOPIC - Abnormal; Notable for the following components:   Glucose, UA >=500 (*)    Ketones, ur 20 (*)    Protein, ur 100 (*)    All other components within normal limits  TROPONIN I - Abnormal; Notable for the following components:   Troponin I 0.03 (*)    All other components within normal limits  BLOOD GAS, VENOUS - Abnormal; Notable for the following components:   pCO2, Ven 43.3 (*)    Acid-base deficit 4.6 (*)    All other components within normal limits  CBC - Abnormal; Notable for the following components:   WBC 20.6 (*)    All other components within normal limits  BASIC METABOLIC PANEL - Abnormal; Notable for the following components:   Chloride 114 (*)    CO2 14 (*)    Glucose, Bld 341 (*)    BUN 44 (*)    Creatinine, Ser 1.87 (*)    Calcium 8.6 (*)    GFR calc non Af Amer 30 (*)    GFR calc Af Amer 35 (*)    All other components within normal limits  GLUCOSE, CAPILLARY - Abnormal; Notable for the following components:   Glucose-Capillary 357 (*)    All other components within normal limits  GLUCOSE, CAPILLARY - Abnormal; Notable for the following components:   Glucose-Capillary 449 (*)    All other components within normal limits  GLUCOSE, CAPILLARY - Abnormal; Notable for the following components:   Glucose-Capillary 411 (*)    All other components within normal  limits  GLUCOSE, CAPILLARY - Abnormal; Notable for the following components:   Glucose-Capillary 344 (*)    All other components within normal limits  TROPONIN I - Abnormal; Notable for the following components:   Troponin I 0.03 (*)    All other components within normal limits  GLUCOSE, CAPILLARY - Abnormal; Notable for the following components:   Glucose-Capillary 258 (*)    All other components within normal limits  GLUCOSE, CAPILLARY - Abnormal; Notable for the following components:   Glucose-Capillary 236 (*)    All other components within normal limits  GLUCOSE, CAPILLARY - Abnormal; Notable for the following components:   Glucose-Capillary 196 (*)    All other components within normal limits  BASIC METABOLIC PANEL - Abnormal; Notable for the following components:   Chloride 112 (*)    CO2 21 (*)    Glucose, Bld 234 (*)    BUN 50 (*)    Creatinine, Ser 1.94 (*)    Calcium 8.3 (*)    GFR calc non Af Amer 29 (*)    GFR calc Af Amer 34 (*)    All other components within normal limits  CBC - Abnormal; Notable for the following components:   WBC 13.5 (*)    RBC 3.64 (*)    Hemoglobin 11.2 (*)    HCT 34.9 (*)    All other components within normal limits  BASIC METABOLIC PANEL - Abnormal; Notable for the following components:   Chloride 118 (*)    CO2 20 (*)    BUN 32 (*)  Creatinine, Ser 1.40 (*)    Calcium 8.5 (*)    GFR calc non Af Amer 43 (*)    GFR calc Af Amer 50 (*)    All other components within normal limits  HEMOGLOBIN A1C - Abnormal; Notable for the following components:   Hgb A1c MFr Bld 5.7 (*)    All other components within normal limits  GLUCOSE, CAPILLARY - Abnormal; Notable for the following components:   Glucose-Capillary 180 (*)    All other components within normal limits  GLUCOSE, CAPILLARY - Abnormal; Notable for the following components:   Glucose-Capillary 101 (*)    All other components within normal limits  GLUCOSE, CAPILLARY - Abnormal;  Notable for the following components:   Glucose-Capillary 58 (*)    All other components within normal limits  GLUCOSE, CAPILLARY - Abnormal; Notable for the following components:   Glucose-Capillary 152 (*)    All other components within normal limits  GLUCOSE, CAPILLARY - Abnormal; Notable for the following components:   Glucose-Capillary 197 (*)    All other components within normal limits  CULTURE, BLOOD (ROUTINE X 2)  CULTURE, BLOOD (ROUTINE X 2)  LIPASE, BLOOD  PROTIME-INR  APTT  HEPARIN LEVEL (UNFRACTIONATED)  HIV ANTIBODY (ROUTINE TESTING W REFLEX)  HEPARIN LEVEL (UNFRACTIONATED)  HEPARIN LEVEL (UNFRACTIONATED)  TSH  GLUCOSE, CAPILLARY    EKG EKG Interpretation  Date/Time:  Tuesday June 11 2018 18:10:00 EST Ventricular Rate:  75 PR Interval:    QRS Duration: 91 QT Interval:  412 QTC Calculation: 461 R Axis:   57 Text Interpretation:  Sinus rhythm Probable left atrial enlargement Borderline repolarization abnormality No significant change since last tracing Confirmed by Duffy Bruce [phone]) on 06/12/2018 3:37:41 PM   Radiology Dg Abd 1 View  Result Date: 06/12/2018 CLINICAL DATA:  Nausea and vomiting EXAM: ABDOMEN - 1 VIEW COMPARISON:  None. FINDINGS: Nonobstructed bowel-gas pattern. Surgical clips in the right upper quadrant. No radiopaque calculi. IMPRESSION: Nonobstructed bowel-gas pattern. Electronically Signed   By: Donavan Foil M.D.   On: 06/12/2018 20:28   US Renal  Result Date: 06/13/2018 CLINICAL DATA:  Elevated creatinine EXAM: RENAL / URINARY TRACT ULTRASOUND COMPLETE COMPARISON:  None. FINDINGS: Right Kidney: Renal measurements: 10.1 x 5.5 x 5.4 cm = volume: 156 mL. Mild cortical thinning is noted. No focal mass or hydronephrosis is seen. Left Kidney: Renal measurements: 12.0 x 5.3 x 5.8 cm = volume: 193 mL. Mild cortical thinning is noted. No focal mass or hydronephrosis is noted. Bladder: Appears normal for degree of bladder distention.  IMPRESSION: Mild cortical thinning.  No obstructive changes are seen. Electronically Signed   By: Inez Catalina M.D.   On: 06/13/2018 10:02    Procedures Procedures (including critical care time)  Medications Ordered in ED Medications  heparin ADULT infusion 100 units/mL (25000 units/225mL sodium chloride 0.45%) (0 Units/hr Intravenous Stopped 06/13/18 1305)  ondansetron (ZOFRAN) tablet 4 mg ( Oral See Alternative 06/12/18 0007)    Or  ondansetron (ZOFRAN) injection 4 mg (4 mg Intravenous Given 06/12/18 0007)  albuterol (PROVENTIL) (2.5 MG/3ML) 0.083% nebulizer solution 2.5 mg (has no administration in time range)  sodium chloride flush (NS) 0.9 % injection 3 mL (3 mLs Intravenous Not Given 06/13/18 0941)  0.9 %  sodium chloride infusion ( Intravenous Rate/Dose Verify 06/13/18 0300)  acetaminophen (TYLENOL) tablet 650 mg (has no administration in time range)    Or  acetaminophen (TYLENOL) suppository 650 mg (has no administration in time range)  rosuvastatin (CRESTOR) tablet 20  mg (20 mg Oral Given 06/12/18 1651)  famotidine (PEPCID) IVPB 20 mg premix ( Intravenous Stopped 06/12/18 2232)  insulin pump (1.4 each Subcutaneous Given 06/13/18 1159)  senna-docusate (Senokot-S) tablet 1 tablet (1 tablet Oral Given 06/13/18 1454)  apixaban (ELIQUIS) tablet 5 mg (5 mg Oral Given 06/13/18 1303)  diltiazem (CARDIZEM CD) 24 hr capsule 240 mg (240 mg Oral Given 06/13/18 1454)  sodium chloride 0.9 % bolus 1,000 mL (1,000 mLs Intravenous Bolus 06/11/18 1113)  promethazine (PHENERGAN) injection 12.5 mg (12.5 mg Intravenous Given 06/11/18 1115)  diltiazem (CARDIZEM) 1 mg/mL load via infusion 20 mg (20 mg Intravenous Bolus from Bag 06/11/18 1123)  metoprolol tartrate (LOPRESSOR) injection 5 mg (5 mg Intravenous Given 06/11/18 1317)  heparin bolus via infusion 3,000 Units (3,000 Units Intravenous Bolus from Bag 06/11/18 1538)  promethazine (PHENERGAN) injection 12.5 mg (12.5 mg Intravenous Given 06/12/18  0045)  insulin aspart (novoLOG) injection 5 Units (5 Units Subcutaneous Given 06/12/18 0037)  insulin aspart (novoLOG) injection 10 Units (10 Units Subcutaneous Given 06/12/18 0046)  lactated ringers bolus 1,000 mL (1,000 mLs Intravenous New Bag/Given 06/12/18 1909)  metoCLOPramide (REGLAN) injection 5 mg (5 mg Intravenous Given 06/13/18 1454)  potassium chloride SA (K-DUR,KLOR-CON) CR tablet 40 mEq (40 mEq Oral Given 06/13/18 0857)     Initial Impression / Assessment and Plan / ED Course  I have reviewed the triage vital signs and the nursing notes.  Pertinent labs & imaging results that were available during my care of the patient were reviewed by me and considered in my medical decision making (see chart for details).    Pt has a hx of tachycardia and is on cardizem, but I could not find a hx of atrial fib.  She is not on blood thinners.  Pt's husband does not recall a hx of afib.  Pt placed on a cardizem drip here and also given lopressor IV.  She was given IVFs.  Hr is better, but still tachycardic.  The nurse has recorded HRs in the 40s, but this does not correlate with the monitor.  I think it was not picking up some beats.  The pt was never in the 34s.  Pt started on heparin per pharmacy consult.  N/v has improved.  Her bs is high, but she is not in DKA.  PH is 7.3.    Pt d/w cardiology who will see her in the morning.  Pt d/w triad hospitalists for admission.  CRITICAL CARE Performed by: Isla Pence   Total critical care time: 45 minutes  Critical care time was exclusive of separately billable procedures and treating other patients.  Critical care was necessary to treat or prevent imminent or life-threatening deterioration.  Critical care was time spent personally by me on the following activities: development of treatment plan with patient and/or surrogate as well as nursing, discussions with consultants, evaluation of patient's response to treatment, examination of  patient, obtaining history from patient or surrogate, ordering and performing treatments and interventions, ordering and review of laboratory studies, ordering and review of radiographic studies, pulse oximetry and re-evaluation of patient's condition.  Final Clinical Impressions(s) / ED Diagnoses   Final diagnoses:  Atrial fibrillation with RVR [facility])  Dehydration    ED Discharge Orders    None       Isla Pence, MD 06/13/18 [phone]

## 2018-06-11 NOTE — ED Notes (Signed)
ED TO INPATIENT HANDOFF REPORT  Name/Age/Gender Tina [name] 52 y.o. female  Code Status Code Status History    Date Active Date Inactive Code Status Order ID Comments User Context   08/24/2011 0206 08/28/2011 2232 Full Code [id number]  Wendall Mola, RN Inpatient      Home/SNF/Other Home  Chief Complaint N/V/D  Level of Care/Admitting Diagnosis ED Disposition    ED Disposition Condition [facility] Area: [facility] [[id number]]  Level of Care: Telemetry [5]  Admit to tele based on following criteria: Complex arrhythmia (Bradycardia/Tachycardia)  Diagnosis: Atrial fibrillation with RVR [facility]) [[id number]]  Admitting Physician: Jonetta Osgood [[id number]]  Attending Physician: Jonetta Osgood [[id number]]  Estimated length of stay: past midnight tomorrow  Certification:: I certify this patient will need inpatient services for at least 2 midnights  PT Class (Do Not Modify): Inpatient [101]  PT Acc Code (Do Not Modify): Private [1]       Medical History Past Medical History:  Diagnosis Date  . Arthritis   . Cataract   . Diabetic retinopathy ([place])   . DM (diabetes mellitus), type 1 ([place])   . Dyspnea   . Elevated LFTs   . Hyperlipidemia   . Hypertension   . Legally blind in right eye, as defined in [place]   . Osteoporosis 11/2017   T score -3.8  . Retinal detachment   . Tachycardia   . Turner's syndrome   . Visual loss, bilateral    right eye light perception only and left eye 20/100    Allergies Allergies  Allergen Reactions  . Acetazolamide Anaphylaxis and Other (See Comments)     Reaction, drop in Blood pressure that caused patient to stay in bed for 5 days, inconherient  Reaction, drop in Blood pressure that caused patient to stay in bed for 5 days, inconherient  . Ace Inhibitors Cough    IV Location/Drains/Wounds Patient Lines/Drains/Airways Status   Active Line/Drains/Airways    Name:   Placement date:   Placement  time:   Site:   Days:   Peripheral IV 06/11/18 Left Antecubital   06/11/18    -    Antecubital   less than 1   Peripheral IV 06/11/18 Right Antecubital   06/11/18    1537    Antecubital   less than 1          Labs/Imaging Results for orders placed or performed during the hospital encounter of 06/11/18 (from the past 48 hour(s))  CBC with Differential     Status: Abnormal   Collection Time: 06/11/18 11:12 AM  Result Value Ref Range   WBC 16.3 (H) 4.0 - 10.5 K/uL   RBC 4.65 3.87 - 5.11 MIL/uL   Hemoglobin 14.2 12.0 - 15.0 g/dL   HCT 43.1 36.0 - 46.0 %   MCV 92.7 80.0 - 100.0 fL   MCH 30.5 26.0 - 34.0 pg   MCHC 32.9 30.0 - 36.0 g/dL   RDW 13.2 11.5 - 15.5 %   Platelets 262 150 - 400 K/uL   nRBC 0.0 0.0 - 0.2 %   Neutrophils Relative % 89 %   Neutro Abs 14.5 (H) 1.7 - 7.7 K/uL   Lymphocytes Relative 6 %   Lymphs Abs 1.0 0.7 - 4.0 K/uL   Monocytes Relative 4 %   Monocytes Absolute 0.7 0.1 - 1.0 K/uL   Eosinophils Relative 0 %   Eosinophils Absolute 0.0 0.0 - 0.5 K/uL   Basophils Relative 0 %  Basophils Absolute 0.1 0.0 - 0.1 K/uL   Immature Granulocytes 1 %   Abs Immature Granulocytes 0.10 (H) 0.00 - 0.07 K/uL    Comment: Performed at [facility], [place] [address] [place], [place] [address]  Comprehensive metabolic panel     Status: Abnormal   Collection Time: 06/11/18 11:12 AM  Result Value Ref Range   Sodium 140 135 - 145 mmol/L   Potassium 4.2 3.5 - 5.1 mmol/L   Chloride 108 98 - 111 mmol/L   CO2 21 (L) 22 - 32 mmol/L   Glucose, Bld 257 (H) 70 - 99 mg/dL   BUN 36 (H) 6 - 20 mg/dL   Creatinine, Ser 1.07 (H) 0.44 - 1.00 mg/dL   Calcium 9.1 8.9 - 10.3 mg/dL   Total Protein 7.3 6.5 - 8.1 g/dL   Albumin 3.5 3.5 - 5.0 g/dL   AST 23 15 - 41 U/L   ALT 33 0 - 44 U/L   Alkaline Phosphatase 123 38 - 126 U/L   Total Bilirubin 1.2 0.3 - 1.2 mg/dL   GFR calc non Af Amer 60 (L) >60 mL/min   GFR calc Af Amer >60 >60 mL/min   Anion gap 11 5 - 15    Comment:  Performed at [facility], [place] [address], [place], [place] [address]  Lipase, blood     Status: None   Collection Time: 06/11/18 11:12 AM  Result Value Ref Range   Lipase 23 11 - 51 U/L    Comment: Performed at [facility], [place] [address] [place], [place] [address]  Urinalysis, Routine w reflex microscopic     Status: Abnormal   Collection Time: 06/11/18 11:12 AM  Result Value Ref Range   Color, Urine YELLOW YELLOW   APPearance CLEAR CLEAR   Specific Gravity, Urine 1.013 1.005 - 1.030   pH 5.0 5.0 - 8.0   Glucose, UA >=500 (A) NEGATIVE mg/dL   Hgb urine dipstick NEGATIVE NEGATIVE   Bilirubin Urine NEGATIVE NEGATIVE   Ketones, ur 20 (A) NEGATIVE mg/dL   Protein, ur 100 (A) NEGATIVE mg/dL   Nitrite NEGATIVE NEGATIVE   Leukocytes, UA NEGATIVE NEGATIVE   RBC / HPF 0-5 0 - 5 RBC/hpf   WBC, UA 0-5 0 - 5 WBC/hpf   Bacteria, UA NONE SEEN NONE SEEN   Mucus PRESENT     Comment: Performed at [facility], [place] [address], [place], [place] [address]  Protime-INR     Status: None   Collection Time: 06/11/18 11:12 AM  Result Value Ref Range   Prothrombin Time 12.0 11.4 - 15.2 seconds   INR 0.89     Comment: Performed at [facility], [place] [address] [place], [place] [address]  APTT     Status: None   Collection Time: 06/11/18 11:12 AM  Result Value Ref Range   aPTT 27 24 - 36 seconds    Comment: Performed at [facility], [place] [address] [place], [place] [address]  Blood gas, venous     Status: Abnormal   Collection Time: 06/11/18  2:30 PM  Result Value Ref Range   pH, Ven 7.309 7.250 - 7.430   pCO2, Ven 43.3 (L) 44.0 - 60.0 mmHg   pO2, Ven 38.5 32.0 - 45.0 mmHg   Bicarbonate 21.1 20.0 - 28.0 mmol/L   Acid-base deficit 4.6 (H) 0.0 - 2.0 mmol/L   O2 Saturation 64.2 %   Patient temperature 98.6  Collection site VEIN    Drawn by COLLECTED BY NURSE    Sample type VENOUS     Comment:  Performed at [place] [address], [place], [place] [address]  Troponin I - ONCE - STAT     Status: Abnormal   Collection Time: 06/11/18  2:50 PM  Result Value Ref Range   Troponin I 0.03 (HH) <0.03 ng/mL    Comment: CRITICAL RESULT CALLED TO, READ BACK BY AND VERIFIED WITH: C.HALL AT 1628 ON 06/11/18 BY N.THOMPSON Performed at [facility], [place] [address], [place], [place] [address]    Ct Head Wo Contrast  Result Date: 06/11/2018 CLINICAL DATA:  Altered level of consciousness. EXAM: CT HEAD WITHOUT CONTRAST TECHNIQUE: Contiguous axial images were obtained from the base of the skull through the vertex without intravenous contrast. COMPARISON:  None. FINDINGS: Brain: Well-defined low-density in the left thalamus consistent with remote lacunar infarct. Small remote left superior cerebellar infarct. Mild low-density in the cerebral white matter, presumably chronic small vessel ischemia. No hemorrhage, cortical infarct, hydrocephalus, or masslike finding. Vascular: No hyperdense vessel or unexpected calcification. Skull: Normal. Negative for fracture or focal lesion. Sinuses/Orbits: Left cataract resection and right enucleation. IMPRESSION: 1. No acute finding. 2. Remote appearing small vessel infarcts. Electronically Signed   By: Monte Fantasia M.D.   On: 06/11/2018 14:02   Dg Chest Portable 1 View  Result Date: 06/11/2018 CLINICAL DATA:  Nausea, vomiting, diarrhea, dizziness today, former smoking history many years ago EXAM: PORTABLE CHEST 1 VIEW COMPARISON:  Chest x-ray of 08/30/2015 FINDINGS: On this portable view, there are somewhat prominent markings medially at the left lung base which could be vascular, but a developing pneumonia cannot be excluded and two-view chest x-ray is recommended. The right lung appears clear. No effusion is seen. Mediastinal and hilar contours are unremarkable and the heart is borderline enlarged. No bony abnormality is  seen. IMPRESSION: Cannot exclude pneumonia medially at the left lung base. Recommend two-view chest x-ray if possible. Electronically Signed   By: Ivar Drape M.D.   On: 06/11/2018 11:54   EKG Interpretation  Date/Time:  Tuesday June 11 2018 11:00:32 EST Ventricular Rate:  150 PR Interval:    QRS Duration: 85 QT Interval:  317 QTC Calculation: 501 R Axis:   87 Text Interpretation:  Atrial fibrillation Nonspecific repol abnormality, diffuse leads Borderline prolonged QT interval new onset afib Confirmed by Isla Pence [phone]) on 06/11/2018 11:23:42 AM   Pending Labs Unresulted Labs (From admission, onward)    Start     Ordered   06/12/18 0500  CBC  Daily,   R     06/11/18 1604   06/11/18 2200  Heparin level (unfractionated)  Once,   R     06/11/18 1604   Signed and Held  HIV antibody (Routine Testing)  Once,   R     Signed and Held   Signed and Held  Basic metabolic panel  Tomorrow morning,   R     Signed and Held   Signed and Held  CBC  Tomorrow morning,   R     Signed and Held          Vitals/Pain Today's Vitals   06/11/18 1615 06/11/18 1630 06/11/18 1645 06/11/18 1700  BP: (!) 142/79 136/89 (!) 152/98 138/89  Pulse: (!) 56 84 100 84  Resp: 20 19 (!) 23 (!) 21  Temp:      TempSrc:      SpO2:  96% 94% 96% 94%    Isolation Precautions No active isolations  Medications Medications  diltiazem (CARDIZEM) 1 mg/mL load via infusion 20 mg (20 mg Intravenous Bolus from Bag 06/11/18 1123)    And  diltiazem (CARDIZEM) 100 mg in dextrose 5% 1101mL (1 mg/mL) infusion (15 mg/hr Intravenous Rate/Dose Verify 06/11/18 1539)  heparin ADULT infusion 100 units/mL (25000 units/240mL sodium chloride 0.45%) (850 Units/hr Intravenous New Bag/Given 06/11/18 1539)  sodium chloride 0.9 % bolus 1,000 mL (1,000 mLs Intravenous Bolus 06/11/18 1113)  promethazine (PHENERGAN) injection 12.5 mg (12.5 mg Intravenous Given 06/11/18 1115)  metoprolol tartrate (LOPRESSOR) injection 5 mg (5 mg  Intravenous Given 06/11/18 1317)  heparin bolus via infusion 3,000 Units (3,000 Units Intravenous Bolus from Bag 06/11/18 1538)    Mobility non-ambulatory

## 2018-06-11 NOTE — H&P (Signed)
HISTORY AND PHYSICAL       PATIENT DETAILS Name: Tina [name] Age: 52 y.o. Sex: female Date of Birth: May 04, 1966 Admit Date: 06/11/2018 BLT:JQZE, Edwyna Shell, MD   Patient coming from: Home   CHIEF COMPLAINT:  Vomiting  HPI: Tina [name] is a 52 y.o. female with medical history significant of DM-1 on insulin pump, hypertension, Turner syndrome, legal blindness presented to the ED for evaluation of the above-noted complaints.  Please note both the patient and her spouse at bedside are very poor historians.  Apparently for the past few months patient has been much more sleepier than usual.  This morning, patient started having numerous episodes of vomiting.  While attempting to get off the chair this afternoon she became very dizzy.  She was subsequently brought to the emergency room, where she was found to have A. fib with RVR.  ED MD started the patient on Cardizem infusion and on IV heparin.  ED MD also has spoken with cardiology-who advised inpatient admission, cardiology will subsequently see patient tomorrow morning.  During my evaluation-patient seemed comfortable-vomited x1 while I was in the room.  She denies any abdominal pain.  She denies any diarrhea.  There was no chest pain or shortness of breath.  There is no history of fever.  Patient significant other has already disconnected patient's insulin pump and plans to take it home   ED Course:  Found to have A. fib with RVR-started on Cardizem infusion and IV heparin.  Hospitalist service asked to admit this patient for further evaluation and treatment.  REVIEW OF SYSTEMS:  Constitutional:   No  weight loss, night sweats,  Fevers, chills, fatigue.  HEENT:    No headaches, Dysphagia,Tooth/dental problems,Sore throat  Cardio-vascular: No chest pain,Orthopnea, PND,lower extremity edema, anasarca, palpitations  GI:  No heartburn, indigestion, abdominal pain, melena or hematochezia  Resp: No  shortness of breath, cough, hemoptysis,plueritic chest pain.   Skin:  No rash or lesions.  GU:  No dysuria, change in color of urine, no urgency or frequency.  No flank pain.  Musculoskeletal: No joint pain or swelling.  No decreased range of motion.  No back pain.  Endocrine: No heat intolerance, no cold intolerance, no polyuria, no polydipsia  Psych: No change in mood or affect. No depression or anxiety.  No memory loss.   ALLERGIES:   Allergies  Allergen Reactions  . Acetazolamide Anaphylaxis and Other (See Comments)     Reaction, drop in Blood pressure that caused patient to stay in bed for 5 days, inconherient  Reaction, drop in Blood pressure that caused patient to stay in bed for 5 days, inconherient  . Ace Inhibitors Cough    PAST MEDICAL HISTORY: Past Medical History:  Diagnosis Date  . Arthritis   . Cataract   . Diabetic retinopathy ([place])   . DM (diabetes mellitus), type 1 ([place])   . Dyspnea   . Elevated LFTs   . Hyperlipidemia   . Hypertension   . Legally blind in right eye, as defined in [place]   . Osteoporosis 11/2017   T score -3.8  . Retinal detachment   . Tachycardia   . Turner's syndrome   . Visual loss, bilateral    right eye light perception only and left eye 20/100    PAST SURGICAL HISTORY: Past Surgical History:  Procedure Laterality Date  . CATARACT EXTRACTION    . CHOLECYSTECTOMY    . Hepatic adenoma removed    .  RETINAL DETACHMENT SURGERY    . right eye surgery      3 years ago  . TONSILLECTOMY      MEDICATIONS AT HOME: Prior to Admission medications   Medication Sig Start Date End Date Taking? Authorizing Provider  diltiazem (CARDIZEM CD) 120 MG 24 hr capsule Take 1 capsule (120 mg total) by mouth daily. 02/06/18  Yes [name], MD  hydrochlorothiazide (HYDRODIURIL) 12.5 MG tablet TAKE 1 TABLET (12.5 MG TOTAL) BY MOUTH DAILY. Patient taking differently: Take 12.5 mg by mouth daily.  02/05/18  Yes [name], MD  Insulin Human (INSULIN PUMP) SOLN Inject 1 each into the skin continuous. Novolog 100 units/ml - basal rate 33 units/day and averaging 55 units/day with meals   Yes [provider]  losartan (COZAAR) 50 MG tablet TAKE 1 TABLET (50 MG TOTAL) BY MOUTH DAILY. 02/05/18  Yes [name], MD  magnesium oxide (MAG-OX) 400 MG tablet Take 400 mg by mouth daily.   Yes [provider]  methocarbamol (ROBAXIN) 500 MG tablet Take 500 mg by mouth 4 (four) times daily as needed for muscle spasms.  12/11/17  Yes [provider]  rosuvastatin (CRESTOR) 20 MG tablet Take 20 mg by mouth daily.   Yes [provider]  triamcinolone cream (KENALOG) 0.1 % Apply 1 application topically 2 (two) times daily as needed (ezcema). Arms and hands 09/14/17  Yes [provider]  Vitamin D, Ergocalciferol, (DRISDOL) 50000 units CAPS capsule Take 1 capsule (50,000 Units total) by mouth every 7 (seven) days. 02/12/18  Yes Princess Bruins, MD  Continuous Blood Gluc Receiver (FREESTYLE LIBRE READER) DEVI by Does not apply route. 12/21/16   [provider]  Continuous Blood Gluc Sensor ([place]) MISC by Does not apply route. 12/21/16   [provider]  glucose blood (ONE TOUCH ULTRA TEST) test strip USE TO TEST 6 TO 8 TIMES DAILY AS NEEDED 09/17/17   [provider]  glucose blood test strip USE TO TEST 6 TO 8 TIMES DAILY AS NEEDED 07/20/14   [provider]    FAMILY HISTORY: Family History  Problem Relation Age of Onset  . Cancer Father        kidney  . Diabetes Maternal Grandmother   . Emphysema Paternal Grandfather   . Esophageal cancer Paternal Aunt   . Colon cancer Neg Hx   . Colon polyps Neg Hx   . Rectal cancer Neg Hx   . Stomach cancer Neg Hx      SOCIAL HISTORY:  reports that she quit smoking about 29 years ago. Her smoking use included cigarettes. She has a 0.10 pack-year smoking history. She has never  used smokeless tobacco. She reports that she drinks alcohol. She reports that she does not use drugs.  PHYSICAL EXAM: Blood pressure (!) 152/98, pulse 100, temperature 97.8 F (36.6 C), temperature source Oral, resp. rate (!) 23, SpO2 96 %.  General appearance :Awake, alert, not in any distress.  Chronically sick appearing Eyes:no scleral icterus.Pink conjunctiva HEENT: Atraumatic and Normocephalic Neck: supple, Resp:Good air entry bilaterally, no added sounds  CVS: S1 S2 irregular GI: Bowel sounds present, Non tender and not distended with no gaurding, rigidity or rebound.No organomegaly Extremities: B/L Lower Ext shows no edema, both legs are warm to touch Neurology: Nonfocal Musculoskeletal:gait appears to be normal.No digital cyanosis Skin:No Rash, warm and dry Wounds:N/A  LABS ON ADMISSION:  I have personally reviewed following labs and imaging studies  CBC: Recent Labs  Lab 06/11/18 1112  WBC 16.3*  NEUTROABS 14.5*  HGB 14.2  HCT 43.1  MCV 92.7  PLT 631    Basic Metabolic Panel: Recent Labs  Lab 06/11/18 1112  NA 140  K 4.2  CL 108  CO2 21*  GLUCOSE 257*  BUN 36*  CREATININE 1.07*  CALCIUM 9.1    GFR: CrCl cannot be calculated (Unknown ideal weight.).  Liver Function Tests: Recent Labs  Lab 06/11/18 1112  AST 23  ALT 33  ALKPHOS 123  BILITOT 1.2  PROT 7.3  ALBUMIN 3.5   Recent Labs  Lab 06/11/18 1112  LIPASE 23   No results for input(s): AMMONIA in the last 168 hours.  Coagulation Profile: Recent Labs  Lab 06/11/18 1112  INR 0.89    Cardiac Enzymes: Recent Labs  Lab 06/11/18 1450  TROPONINI 0.03*    BNP (last 3 results) No results for input(s): PROBNP in the last 8760 hours.  HbA1C: No results for input(s): HGBA1C in the last 72 hours.  CBG: No results for input(s): GLUCAP in the last 168 hours.  Lipid Profile: No results for input(s): CHOL, HDL, LDLCALC, TRIG, CHOLHDL, LDLDIRECT in the last 72 hours.  Thyroid  Function Tests: No results for input(s): TSH, T4TOTAL, FREET4, T3FREE, THYROIDAB in the last 72 hours.  Anemia Panel: No results for input(s): VITAMINB12, FOLATE, FERRITIN, TIBC, IRON, RETICCTPCT in the last 72 hours.  Urine analysis:    Component Value Date/Time   COLORURINE YELLOW 06/11/2018 [place] 06/11/2018 1112   LABSPEC 1.013 06/11/2018 1112   PHURINE 5.0 06/11/2018 1112   GLUCOSEU >=500 (A) 06/11/2018 1112   HGBUR NEGATIVE 06/11/2018 1112   BILIRUBINUR NEGATIVE 06/11/2018 1112   KETONESUR 20 (A) 06/11/2018 1112   PROTEINUR 100 (A) 06/11/2018 1112   UROBILINOGEN 0.2 08/24/2011 0620   NITRITE NEGATIVE 06/11/2018 1112   LEUKOCYTESUR NEGATIVE 06/11/2018 1112    Sepsis Labs: Lactic Acid, Venous No results found for: [place]   Microbiology: No results found for this or any previous visit (from the past 240 hour(s)).    RADIOLOGIC STUDIES ON ADMISSION: Ct Head Wo Contrast  Result Date: 06/11/2018 CLINICAL DATA:  Altered level of consciousness. EXAM: CT HEAD WITHOUT CONTRAST TECHNIQUE: Contiguous axial images were obtained from the base of the skull through the vertex without intravenous contrast. COMPARISON:  None. FINDINGS: Brain: Well-defined low-density in the left thalamus consistent with remote lacunar infarct. Small remote left superior cerebellar infarct. Mild low-density in the cerebral white matter, presumably chronic small vessel ischemia. No hemorrhage, cortical infarct, hydrocephalus, or masslike finding. Vascular: No hyperdense vessel or unexpected calcification. Skull: Normal. Negative for fracture or focal lesion. Sinuses/Orbits: Left cataract resection and right enucleation. IMPRESSION: 1. No acute finding. 2. Remote appearing small vessel infarcts. Electronically Signed   By: Monte Fantasia M.D.   On: 06/11/2018 14:02   Dg Chest Portable 1 View  Result Date: 06/11/2018 CLINICAL DATA:  Nausea, vomiting, diarrhea, dizziness today,  former smoking history many years ago EXAM: PORTABLE CHEST 1 VIEW COMPARISON:  Chest x-ray of 08/30/2015 FINDINGS: On this portable view, there are somewhat prominent markings medially at the left lung base which could be vascular, but a developing pneumonia cannot be excluded and two-view chest x-ray is recommended. The right lung appears clear. No effusion is seen. Mediastinal and hilar contours are unremarkable and the heart is borderline enlarged. No bony abnormality is seen. IMPRESSION: Cannot exclude pneumonia medially at the left lung base. Recommend  two-view chest x-ray if possible. Electronically Signed   By: Ivar Drape M.D.   On: 06/11/2018 11:54    EKG:  Personally reviewed.  Atrial fibrillation  ASSESSMENT AND PLAN: Nausea with vomiting: Not sure if this is secondary to a viral syndrome-or patient has underlying gastroparesis from long-standing history of DM-1.  Normal exam is completely benign-her last bowel movement was yesterday-there is no suggestion of bowel obstruction at this time.  Lipase levels are within normal limits as well.  We will provide supportive care-keep her n.p.o.-provide antiemetics-and hopefully she will respond to these measures.    A. fib with RVR: Continue Cardizem infusion-her chads2Vasc score is around 3.  Transthoracic echocardiogram has been ordered.  ED MD has already consulted  [facility]-they will evaluate patient tomorrow.  Once heart rate appears appropriately controlled-we can transition to oral Cardizem.  DM-1: On insulin infusion via pump-patient significant other has already disconnected the patient's insulin pump.  Per patient-she averages around 28 units of basal insulin a day-hence will place patient on 18 units of Lantus as she will be n.p.o. and is continuing to vomit.  She will also be placed on a sliding scale insulin.  Once her clinical situation stabilizes-we can transition her back to her insulin pump.  We will consult diabetic  coordinator.  Hypertension: Pressure is currently controlled with Cardizem infusion-we will hold losartan and HCTZ.  Dyslipidemia: Continue statin.  Legal blindness: Apparently has a prosthetic right eye (retinal detachment) and is legally blind in the left eye.  Further plan will depend as patient's clinical course evolves and further radiologic and laboratory data become available. Patient will be monitored closely.  Above noted plan was discussed with patient/family face to face at bedside, they were in agreement.   CONSULTS: Cardiology by ED MD  DVT Prophylaxis: Heparin infusion  Code Status: Full Code  Disposition Plan:  Discharge back home possibly in 2 days, depending on clinical course  Admission status:  Inpatient going to tele  The medical decision making on this patient was of high complexity and the patient is at high risk for clinical deterioration, therefore this is a level 3 visit.   Total time spent  55 minutes.Greater than 50% of this time was spent in counseling, explanation of diagnosis, planning of further management, and coordination of care.  Severity of Illness: The appropriate patient status for this patient is INPATIENT. Inpatient status is judged to be reasonable and necessary in order to provide the required intensity of service to ensure the patient's safety. The patient's presenting symptoms, physical exam findings, and initial radiographic and laboratory data in the context of their chronic comorbidities is felt to place them at high risk for further clinical deterioration. Furthermore, it is not anticipated that the patient will be medically stable for discharge from the hospital within 2 midnights of admission. The following factors support the patient status of inpatient.   " The patient's presenting symptoms include nausea with vomiting " The worrisome physical exam findings include A. fib with RVR " The chronic co-morbidities include DM-1, legal  blindness, hypertension   * I certify that at the point of admission it is my clinical judgment that the patient will require inpatient hospital care spanning beyond 2 midnights from the point of admission due to high intensity of service, high risk for further deterioration and high frequency of surveillance required.  Oren Binet [facility] Pager [phone]  If 7PM-7AM, please contact night-coverage [email or web address] Password [facility] 06/11/2018, 5:08 PM

## 2018-06-11 NOTE — Progress Notes (Signed)
ANTICOAGULATION CONSULT NOTE -  Consult  Pharmacy Consult for IV heparin Indication: atrial fibrillation  Allergies  Allergen Reactions  . Acetazolamide Anaphylaxis and Other (See Comments)     Reaction, drop in Blood pressure that caused patient to stay in bed for 5 days, inconherient  Reaction, drop in Blood pressure that caused patient to stay in bed for 5 days, inconherient  . Ace Inhibitors Cough    Patient Measurements: Height: 4\' 9"  (144.8 cm) Weight: 149 lb 7.6 oz (67.8 kg) IBW/kg (Calculated) : 38.6 Heparin Dosing Weight: 68 kg ( from last available visit in Epic on 12/18/2017)   Vital Signs: Temp: 97.8 F (36.6 C) (11/26 1844) Temp Source: Oral (11/26 1844) BP: 160/80 (11/26 1844) Pulse Rate: 81 (11/26 1844)  Labs: Recent Labs    06/11/18 1112 06/11/18 1450 06/11/18 2153  HGB 14.2  --   --   HCT 43.1  --   --   PLT 262  --   --   APTT 27  --   --   LABPROT 12.0  --   --   INR 0.89  --   --   HEPARINUNFRC  --   --  0.70  CREATININE 1.07*  --   --   TROPONINI  --  0.03*  --     Estimated Creatinine Clearance: 48.8 mL/min (A) (by C-G formula based on SCr of 1.07 mg/dL (H)).   Medical History: Past Medical History:  Diagnosis Date  . Arthritis   . Cataract   . Diabetic retinopathy ([place])   . DM (diabetes mellitus), type 1 ([place])   . Dyspnea   . Elevated LFTs   . Hyperlipidemia   . Hypertension   . Legally blind in right eye, as defined in [place]   . Osteoporosis 11/2017   T score -3.8  . Retinal detachment   . Tachycardia   . Turner's syndrome   . Visual loss, bilateral    right eye light perception only and left eye 20/100    Medications:  Scheduled:  . [START ON 06/12/2018] insulin aspart  0-9 Units Subcutaneous TID WC  . insulin glargine  18 Units Subcutaneous QHS  . rosuvastatin  20 mg Oral q1800  . sodium chloride flush  3 mL Intravenous Q12H    Assessment: Pharmacy is consulted to dose heparin in 52 yo female diagnosed with atrial  fibrillation. Pt has also been started on diltiazem infusion. No blood thinners PTA.   Of note, no recent weight available. In order to start heparin for the patient, used a listed weight from 12/18/2017 that was 68 kg.  Today, 06/11/18   Baseline labs are WNL: INR is 0.89, PT 12, aPTT 27  Hgb 14.2, plt 262  Scr 1.08 mg/dl  2153 HL = 0.70 at high end of goal, no infusion or bleeding issues reported by RN.      Goal of Therapy:  Heparin level 0.3-0.7 units/ml Monitor platelets by anticoagulation protocol: Yes   Plan:   Continue heparin drip at 850 units/hr  Recheck confirmatory level with am labs  Daily CBC  Monitor for signs and symptoms of bleeding    Dorrene German 06/11/2018 10:48 PM

## 2018-06-11 NOTE — Progress Notes (Signed)
  Follow up visit   06/05/2018    Assessment: Comments: Patient contacted me that she has changed her PCP from Nonda Lou, NP to Dr. Yaakov Guthrie. He has recommended she obtain an endocrinologist for management of her type 1 diabetes and her insulin pump. She also states her BG are not dropping as much in the AM with the adjustment we made recently but her afternoon BG's are higher than desired so would like her pump settings adjusted. .  Orders received from MD giving me permission to make insulin pump adjustments for the following patient.  Pump Settings: Date: Current   Date: 06/05/2018   Changes is bold print   Basal Rate: Carb Ratio Sensitivity  Basal Rate: Carb Ratio Sensitivity   MN: 1.25 1 unit / 6 grams 36 MN: 1.45 12 A: 1 unit / 6 grams   4 AM 0.850   4 AM 0.850 12 PM:  1 unit / 5 grams   8 AM 2.00   8 AM 2.00      10 AM 1.65   10 AM 1.65      2 PM 1.35   2 PM 1.35                       I was unable to  Upload her pump today due to some password issues. Her husband offered to upload at home and notify me when completed, which we ended up doing.   Patient reports AM BG doing better with less frequent or severe hypoglycemia. She is concerned over elevated BG in the afternoon. I plan to increase her ICR at noon through the evening to help with hyperglycemia later in the day.           I also encouraged her to pursue getting an appointment with an endocrinologist, as she will need one for her pump management and for supplies to be ordered. She and her husband agreed to make an appointment with Dr. Buddy Duty, who is also in the [facility] system as is her new PCP.   Follow up: Patient to provide additional BG data as needed for further review

## 2018-06-11 NOTE — ED Notes (Signed)
Bed: DS89 Expected date:  Expected time:  Means of arrival:  Comments: EMS-N/V-triage

## 2018-06-11 NOTE — ED Triage Notes (Signed)
Patient BIB EMS from home with complaints of nausea/vomiting/diarrhea/dizziness, starting this morning. Patient denies LOC. AxOx4. EMS administered 4mg  IV Zofran. 20G Right AC PIV placed by EMS. EMS VS: CBG=178, BP 202/105, HR 88, 96% RA

## 2018-06-12 ENCOUNTER — Inpatient Hospital Stay (HOSPITAL_COMMUNITY): Payer: Medicare HMO

## 2018-06-12 DIAGNOSIS — I4891 Unspecified atrial fibrillation: Secondary | ICD-10-CM

## 2018-06-12 LAB — ECHOCARDIOGRAM COMPLETE
HEIGHTINCHES: 57 in
WEIGHTICAEL: 2391.55 [oz_av]

## 2018-06-12 LAB — BASIC METABOLIC PANEL
ANION GAP: 6 (ref 5–15)
Anion gap: 15 (ref 5–15)
BUN: 44 mg/dL — AB (ref 6–20)
BUN: 50 mg/dL — AB (ref 6–20)
CHLORIDE: 114 mmol/L — AB (ref 98–111)
CHLORIDE: 112 mmol/L — AB (ref 98–111)
CO2: 14 mmol/L — AB (ref 22–32)
CO2: 21 mmol/L — AB (ref 22–32)
Calcium: 8.6 mg/dL — ABNORMAL LOW (ref 8.9–10.3)
Calcium: 8.3 mg/dL — ABNORMAL LOW (ref 8.9–10.3)
Creatinine, Ser: 1.87 mg/dL — ABNORMAL HIGH (ref 0.44–1.00)
Creatinine, Ser: 1.94 mg/dL — ABNORMAL HIGH (ref 0.44–1.00)
GFR calc Af Amer: 35 mL/min — ABNORMAL LOW (ref >60)
GFR calc Af Amer: 34 mL/min — ABNORMAL LOW (ref >60)
GFR calc non Af Amer: 30 mL/min — ABNORMAL LOW (ref >60)
GFR calc non Af Amer: 29 mL/min — ABNORMAL LOW (ref >60)
GLUCOSE: 234 mg/dL — AB (ref 70–99)
Glucose, Bld: 341 mg/dL — ABNORMAL HIGH (ref 70–99)
POTASSIUM: 3.9 mmol/L (ref 3.5–5.1)
Potassium: 4.1 mmol/L (ref 3.5–5.1)
Sodium: 143 mmol/L (ref 135–145)
Sodium: 139 mmol/L (ref 135–145)

## 2018-06-12 LAB — GLUCOSE, CAPILLARY
GLUCOSE-CAPILLARY: 344 mg/dL — AB (ref 70–99)
GLUCOSE-CAPILLARY: 411 mg/dL — AB (ref 70–99)
GLUCOSE-CAPILLARY: 449 mg/dL — AB (ref 70–99)
Glucose-Capillary: 258 mg/dL — ABNORMAL HIGH (ref 70–99)
Glucose-Capillary: 236 mg/dL — ABNORMAL HIGH (ref 70–99)
Glucose-Capillary: 196 mg/dL — ABNORMAL HIGH (ref 70–99)
Glucose-Capillary: 180 mg/dL — ABNORMAL HIGH (ref 70–99)

## 2018-06-12 LAB — CBC
HCT: 40.8 % (ref 36.0–46.0)
Hemoglobin: 12.9 g/dL (ref 12.0–15.0)
MCH: 30.5 pg (ref 26.0–34.0)
MCHC: 31.6 g/dL (ref 30.0–36.0)
MCV: 96.5 fL (ref 80.0–100.0)
NRBC: 0 % (ref 0.0–0.2)
PLATELETS: 286 10*3/uL (ref 150–400)
RBC: 4.23 MIL/uL (ref 3.87–5.11)
RDW: 13.3 % (ref 11.5–15.5)
WBC: 20.6 10*3/uL — ABNORMAL HIGH (ref 4.0–10.5)

## 2018-06-12 LAB — TROPONIN I: Troponin I: 0.03 ng/mL (ref <0.03)

## 2018-06-12 LAB — HIV ANTIBODY (ROUTINE TESTING W REFLEX): HIV Screen 4th Generation wRfx: NONREACTIVE

## 2018-06-12 LAB — HEPARIN LEVEL (UNFRACTIONATED): HEPARIN UNFRACTIONATED: 0.59 [IU]/mL (ref 0.30–0.70)

## 2018-06-12 MED ORDER — INSULIN ASPART 100 UNIT/ML ~~LOC~~ SOLN
0.0000 [IU] | SUBCUTANEOUS | Status: DC
  Administered 2018-06-12: 5 [IU] via SUBCUTANEOUS
  Administered 2018-06-12: 3 [IU] via SUBCUTANEOUS
  Administered 2018-06-12: 7 [IU] via SUBCUTANEOUS
  Administered 2018-06-12: 2 [IU] via SUBCUTANEOUS

## 2018-06-12 MED ORDER — INSULIN PUMP
Freq: Three times a day (TID) | SUBCUTANEOUS | Status: DC
  Administered 2018-06-12: 03:00:00 via SUBCUTANEOUS
  Administered 2018-06-12: 1.8 via SUBCUTANEOUS
  Administered 2018-06-13: 1.4 via SUBCUTANEOUS
  Administered 2018-06-13: 9 via SUBCUTANEOUS
  Administered 2018-06-13: 7 via SUBCUTANEOUS
  Administered 2018-06-14: 8.1 via SUBCUTANEOUS
  Filled 2018-06-12: qty 1

## 2018-06-12 MED ORDER — INSULIN ASPART 100 UNIT/ML ~~LOC~~ SOLN
5.0000 [IU] | Freq: Once | SUBCUTANEOUS | Status: AC
  Administered 2018-06-12: 5 [IU] via SUBCUTANEOUS

## 2018-06-12 MED ORDER — INSULIN ASPART 100 UNIT/ML ~~LOC~~ SOLN
10.0000 [IU] | Freq: Once | SUBCUTANEOUS | Status: AC
  Administered 2018-06-12: 10 [IU] via SUBCUTANEOUS

## 2018-06-12 MED ORDER — PROMETHAZINE HCL 25 MG/ML IJ SOLN
12.5000 mg | Freq: Once | INTRAMUSCULAR | Status: AC
  Administered 2018-06-12: 12.5 mg via INTRAVENOUS
  Filled 2018-06-12: qty 1

## 2018-06-12 MED ORDER — FAMOTIDINE IN NACL 20-0.9 MG/50ML-% IV SOLN
20.0000 mg | INTRAVENOUS | Status: DC
  Administered 2018-06-12 – 2018-06-13 (×2): 20 mg via INTRAVENOUS
  Filled 2018-06-12 (×2): qty 50

## 2018-06-12 MED ORDER — LACTATED RINGERS IV BOLUS
1000.0000 mL | Freq: Once | INTRAVENOUS | Status: AC
  Administered 2018-06-12: 1000 mL via INTRAVENOUS

## 2018-06-12 MED ORDER — METOCLOPRAMIDE HCL 5 MG/ML IJ SOLN
5.0000 mg | Freq: Three times a day (TID) | INTRAMUSCULAR | Status: AC
  Administered 2018-06-12 – 2018-06-13 (×3): 5 mg via INTRAVENOUS
  Filled 2018-06-12 (×3): qty 2

## 2018-06-12 NOTE — Progress Notes (Signed)
2D Echocardiogram has been performed.  Tina [name] 06/12/2018, 8:46 AM

## 2018-06-12 NOTE — Progress Notes (Signed)
Admit with: A Fib with RVR + N&V  History: Type 1 Diabetes, Legal Blindness  Home DM Meds: Insulin Pump  Current Orders: Lantus 18 units QHS                            Novolog Sensitive Correction Scale/ SSI (0-9 units) Q4 hours   Spoke with pt today.  Pt A&O and stated her husband helps her get her pump set up and on but she is able to manage her own insulin boluses by herself.  Has appointment to see Dr. Delrae Rend with [facility] in May of 2020.  Pt stated she was angry that the doctor in the MD made her remove her pump.  Explained to pt that the team will speak with the Attending MD to see if we can get permission to have patient restart her insulin pump this afternoon.  Pt seemed agitated and was very restless while I was in the room but stated she can handle her pump even with her sight issues.  Tried to explain to patient that b/c she has some Lantus insulin on board we may need to delay her restarting her pump until later this afternoon (maybe between 2-4 pm) but we need to get orders from the Attending before she resumes her insulin pump.  Discussed above info with Mable Fill, RN.  Marissa to speak with Dr. Erlinda Hong (Attending) and see about getting permission to have pt restart her insulin pump later this afternoon as patient desires to resume her pump and is capable of managing her pump.    Patient saw Jeanie Sewer, RD at the [facility] [facility] on 06/05/2018.  Pump changes were made to pt's pump at that visit. --Insulin Pump Settings--  Basal Rates: 12am- 1.45 units/hr 4am- 0.850 units/hr 8am- 2 units/hr 10am- 1.65 units/hr 2pm- 1.35 units/hr  Total Basal Insulin per 24 hours period= 33.3 units  Carbohydrate Ratio:  12am- 1 unit for every 6 Grams of Carbohydrates 12pm- 1 unit for every 5 Grams of Carbohydrates  Correction/Sensitivity Factor: 1 unit for every 36 mg/dl above Target CBG  Target CBG: 100 mg/dl    --Will  follow patient during hospitalization--  Wyn Quaker RN, MSN, CDE Diabetes Coordinator Inpatient Glycemic Control Team Team Pager: [phone] (8a-5p)

## 2018-06-12 NOTE — Progress Notes (Signed)
Pt's HS CBG 357. No insulin coverage ordered, so NP on call, Schorr, paged. Order for one time dose of 5 units novolog as well as q4 CBG obtained d/t pt's NPO status. Per pt, she is on an insulin pump at home. Pt vomiting x3. 0000 CBG result was 449 so NP paged again. 10 additional units of insulin ordered. Upon reassessment 1 hour later, CBG 411. NP paged with results. No new orders at this time. Pt no longer vomiting. Will recheck CBG at 0400 per q4 orders. Will continue to monitor.

## 2018-06-12 NOTE — Progress Notes (Signed)
PROGRESS NOTE  Tina [name] JOA:[id number] DOB: June 16, 1966 DOA: 06/11/2018 PCP: Vernie Shanks, MD  HPI/Recap of past 24 hours:  Reports feeling better, wants to eat , she wants to use her own pump  Assessment/Plan: Principal Problem:   Atrial fibrillation with RVR ([place]) Active Problems:   History of retinal detachment   HTN (hypertension)   IDDM (insulin dependent diabetes mellitus) ([place])  Nausea vomiting -Likely from diabetic gastroparesis, supportive treatment with antiemetics, IV fluids,  Reglan, start clears for now  AKI on CKD 2 -UA no bacteria,  -We will get renal ultrasound -Continue hydration -Renal dosing meds  New diagnosis of A. fib RVR  -She is converted to sinus rhythm on Cardizem drip, will continue Cardizem drip and heparin drip for now due to nausea and vomiting -Echocardiogram left ventricular EF within normal limits -EDP has consulted cardiology I do not see a consult note, will reconsult cardiology  Leukocytosis -No fever, continue hydration, add on blood culture  Type 1 diabetes -She report recent A1c 5.8 -She would prefer to use her own pump -Diabetes RN input appreciated  Code Status: full  Family Communication: patient   Disposition Plan: home with home health once medically stable, not ready to discharge   Consultants:  cardiology  Procedures:  none  Antibiotics:  none   Objective: BP (!) 121/56 (BP Location: Left Arm)   Pulse 73   Temp 98.9 F (37.2 C) (Oral)   Resp 18   Ht 4\' 9"  (1.448 m)   Wt 67.8 kg   SpO2 93%   BMI 32.35 kg/m   Intake/Output Summary (Last 24 hours) at 06/12/2018 1907 Last data filed at 06/12/2018 1600 Gross per 24 hour  Intake 2473.77 ml  Output 701 ml  Net 1772.77 ml   Filed Weights   06/11/18 1844  Weight: 67.8 kg    Exam: Patient is examined daily including today on 06/12/2018, exams remain the same as of yesterday except that has changed    General:  NAD, legally blind,  hard of hearing  Cardiovascular: RRR  Respiratory: CTABL  Abdomen: Soft/ND/NT, positive BS  Musculoskeletal: No Edema  Neuro: alert, oriented   Data Reviewed: Basic Metabolic Panel: Recent Labs  Lab 06/11/18 1112 06/12/18 0543 06/12/18 1638  NA 140 143 139  K 4.2 4.1 3.9  CL 108 114* 112*  CO2 21* 14* 21*  GLUCOSE 257* 341* 234*  BUN 36* 44* 50*  CREATININE 1.07* 1.87* 1.94*  CALCIUM 9.1 8.6* 8.3*   Liver Function Tests: Recent Labs  Lab 06/11/18 1112  AST 23  ALT 33  ALKPHOS 123  BILITOT 1.2  PROT 7.3  ALBUMIN 3.5   Recent Labs  Lab 06/11/18 1112  LIPASE 23   No results for input(s): AMMONIA in the last 168 hours. CBC: Recent Labs  Lab 06/11/18 1112 06/12/18 0543  WBC 16.3* 20.6*  NEUTROABS 14.5*  --   HGB 14.2 12.9  HCT 43.1 40.8  MCV 92.7 96.5  PLT 262 286   Cardiac Enzymes:   Recent Labs  Lab 06/11/18 1450 06/12/18 0551  TROPONINI 0.03* 0.03*   BNP (last 3 results) No results for input(s): BNP in the last 8760 hours.  ProBNP (last 3 results) No results for input(s): PROBNP in the last 8760 hours.  CBG: Recent Labs  Lab 06/12/18 0141 06/12/18 0427 06/12/18 0742 06/12/18 1148 06/12/18 1554  GLUCAP 411* 344* 258* 236* 196*    No results found for this or any previous visit (from the  past 240 hour(s)).   Studies: No results found.  Scheduled Meds: . insulin pump   Subcutaneous TID AC, HS, 0200  . metoCLOPramide (REGLAN) injection  5 mg Intravenous Q8H  . rosuvastatin  20 mg Oral q1800  . sodium chloride flush  3 mL Intravenous Q12H    Continuous Infusions: . sodium chloride 100 mL/hr at 06/12/18 1904  . diltiazem (CARDIZEM) infusion 15 mg/hr (06/12/18 1903)  . famotidine (PEPCID) IV    . heparin 850 Units/hr (06/12/18 0600)  . lactated ringers       Time spent: 35 mins I have personally reviewed and interpreted on  06/12/2018 daily labs, tele strips, imagings as discussed above under date review session and  assessment and plans.  I reviewed all nursing notes, pharmacy notes, vitals, pertinent old records  I have discussed plan of care as described above with RN , patient on 06/12/2018   Florencia Reasons MD, PhD  [facility] Pager [phone]. If 7PM-7AM, please contact night-coverage at [email or web address], password [facility] 06/12/2018, 7:07 PM  LOS: 1 day

## 2018-06-12 NOTE — Evaluation (Signed)
Physical Therapy Evaluation Patient Details Name: Tina [name] MRN: [id number] DOB: Jan 06, 1966 Today's Date: 06/12/2018   History of Present Illness  52 y.o. female with medical history significant of DM-1 on insulin pump, hypertension, Turner syndrome, legal blindness, HOH and admitted for A. fib with RVR-started on Cardizem infusion and IV heparin  Clinical Impression  Pt admitted with above diagnosis. Pt currently with functional limitations due to the deficits listed below (see PT Problem List).  Pt will benefit from skilled PT to increase their independence and safety with mobility to allow discharge to the venue listed below.  Pt presents with poor mobility and unsteady gait at this time.  Pt's HR remained under 100 bpm during session. BP pregait: 110/51 mmHg and postgait: 121/56 mmHg.  Pt reports inability to ambulate just prior to admission however at baseline, independent with mobility and ADLs.  Recommend supervision for mobility for safety upon d/c as pt able to ambulate however limited distance, uncoordinated and unsteady at this time.     Follow Up Recommendations Home health PT;Supervision for mobility/OOB    Equipment Recommendations  Rolling walker with 5" wheels    Recommendations for Other Services       Precautions / Restrictions Precautions Precautions: Fall Precaution Comments: HOH, legally blind      Mobility  Bed Mobility Overal bed mobility: Needs Assistance Bed Mobility: Supine to Sit;Sit to Supine     Supine to sit: Min guard Sit to supine: Min guard   General bed mobility comments: min/guard for safety and lines  Transfers Overall transfer level: Needs assistance Equipment used: None Transfers: Sit to/from Stand Sit to Stand: Min assist         General transfer comment: assist for steady with rise  Ambulation/Gait Ambulation/Gait assistance: Min assist Gait Distance (Feet): 40 Feet Assistive device: IV Pole Gait Pattern/deviations:  Step-through pattern;Decreased stride length Gait velocity: decr   General Gait Details: very poor coordination observed, pt reports mild dizziness throughout ambulation, distance therefore limited for safety, assist required for steadying, pt pushed IV pole  Stairs            Wheelchair Mobility    Modified Rankin (Stroke Patients Only)       Balance Overall balance assessment: Needs assistance         Standing balance support: Single extremity supported Standing balance-Leahy Scale: Poor Standing balance comment: requires UE support at this time                             Pertinent Vitals/Pain Pain Assessment: No/denies pain    Home Living Family/patient expects to be discharged to:: Private residence Living Arrangements: Spouse/significant other   Type of Home: House Home Access: Stairs to enter   Technical brewer of Steps: 3 Home Layout: One level Home Equipment: None      Prior Function Level of Independence: Independent               Hand Dominance        Extremity/Trunk Assessment        Lower Extremity Assessment Lower Extremity Assessment: Generalized weakness;RLE deficits/detail;LLE deficits/detail RLE Deficits / Details: poor functional coordination observed bilaterally       Communication   Communication: HOH  Cognition Arousal/Alertness: Awake/alert Behavior During Therapy: WFL for tasks assessed/performed Overall Cognitive Status: Within Functional Limits for tasks assessed  General Comments      Exercises     Assessment/Plan    PT Assessment Patient needs continued PT services  PT Problem List Decreased strength;Decreased mobility;Decreased activity tolerance;Decreased balance;Decreased knowledge of use of DME;Decreased coordination       PT Treatment Interventions DME instruction;Gait training;Therapeutic activities;Therapeutic  exercise;Patient/family education;Balance training;Functional mobility training;Stair training    PT Goals (Current goals can be found in the Care Plan section)  Acute Rehab PT Goals PT Goal Formulation: With patient Time For Goal Achievement: 06/19/18 Potential to Achieve Goals: Good    Frequency Min 3X/week   Barriers to discharge        Co-evaluation               AM-PAC PT "6 Clicks" Mobility  Outcome Measure Help needed turning from your back to your side while in a flat bed without using bedrails?: A Little Help needed moving from lying on your back to sitting on the side of a flat bed without using bedrails?: A Little Help needed moving to and from a bed to a chair (including a wheelchair)?: A Little Help needed standing up from a chair using your arms (e.g., wheelchair or bedside chair)?: A Little Help needed to walk in hospital room?: A Little Help needed climbing 3-5 steps with a railing? : A Lot 6 Click Score: 17    End of Session Equipment Utilized During Treatment: Gait belt Activity Tolerance: Patient limited by fatigue Patient left: in bed;with bed alarm set;with call bell/phone within reach;with nursing/sitter in room Nurse Communication: Mobility status PT Visit Diagnosis: Other abnormalities of gait and mobility (R26.89);Unsteadiness on feet (R26.81)    Time: 1353-1410 PT Time Calculation (min) (ACUTE ONLY): 17 min   Charges:   PT Evaluation $PT Eval Moderate Complexity: [place], PT, DPT Acute Rehabilitation Services Office: [phone] Pager: [phone]  Trena Platt 06/12/2018, 2:28 PM

## 2018-06-12 NOTE — Progress Notes (Signed)
ANTICOAGULATION CONSULT NOTE - Follow Up Consult  Pharmacy Consult for Heparin Indication: atrial fibrillation  Allergies  Allergen Reactions  . Acetazolamide Anaphylaxis and Other (See Comments)     Reaction, drop in Blood pressure that caused patient to stay in bed for 5 days, inconherient  Reaction, drop in Blood pressure that caused patient to stay in bed for 5 days, inconherient  . Ace Inhibitors Cough    Patient Measurements: Height: 4\' 9"  (144.8 cm) Weight: 149 lb 7.6 oz (67.8 kg) IBW/kg (Calculated) : 38.6 Heparin Dosing Weight: 54 kg  Vital Signs: Temp: 98.2 F (36.8 C) (11/27 0422) Temp Source: Oral (11/27 0422) BP: 139/59 (11/27 0422) Pulse Rate: 70 (11/27 0422)  Labs: Recent Labs    06/11/18 1112 06/11/18 1450 06/11/18 2153 06/12/18 0543 06/12/18 0551  HGB 14.2  --   --  12.9  --   HCT 43.1  --   --  40.8  --   PLT 262  --   --  286  --   APTT 27  --   --   --   --   LABPROT 12.0  --   --   --   --   INR 0.89  --   --   --   --   HEPARINUNFRC  --   --  0.70 0.59  --   CREATININE 1.07*  --   --  1.87*  --   TROPONINI  --  0.03*  --   --  0.03*    Estimated Creatinine Clearance: 27.9 mL/min (A) (by C-G formula based on SCr of 1.87 mg/dL (H)).   Medications:   Assessment: 52 y/o F admitted with AFIB with RVR. CHA2DS2/VAS= 3. Patient started on heparin infusion.    06/12/18  HL remains at goal  H/H decreased slightly, Plts normal  No bleeding or issues with IV site per RN   Goal of Therapy:  Heparin level 0.3-0.7 units/ml Monitor platelets by anticoagulation protocol: Yes   Plan:  Continue heparin infusion at 850 units/hr.   Daily HL/CBC  Monitor for signs of bleeding  [name] D 06/12/2018,8:49 AM

## 2018-06-12 NOTE — Progress Notes (Signed)
Pharmacy Brief Note  Famotidine dose decreased to 20 mg q 24h due to renal insufficiency.   Estimated Creatinine Clearance: 27.9 mL/min (A) (by C-G formula based on SCr of 1.87 mg/dL (H)).[name], PharmD Clinical Pharmacist Pager # [phone]

## 2018-06-12 NOTE — Progress Notes (Signed)
Inpatient Diabetes Program Recommendations  AACE/ADA: New Consensus Statement on Inpatient Glycemic Control (2015)  Target Ranges:  Prepandial:   less than 140 mg/dL      Peak postprandial:   less than 180 mg/dL (1-2 hours)      Critically ill patients:  140 - 180 mg/dL    Results for LAYAN, ZALENSKI (MRN [id number]) as of 06/12/2018 08:53  Ref. Range 06/11/2018 23:03 06/12/2018 00:35 06/12/2018 01:41 06/12/2018 04:27 06/12/2018 07:42  Glucose-Capillary Latest Ref Range: 70 - 99 mg/dL 357 (H)  18 units LANTUS given at 10pm 449 (H)  15 units NOVOLOG  411 (H) 344 (H)  7 units NOVOLOG  258 (H)  5 units NOVOLOG     Admit with: A Fib with RVR + N&V  History: Type 1 Diabetes, Legal Blindness  Home DM Meds: Insulin Pump  Current Orders: Lantus 18 units QHS      Novolog Sensitive Correction Scale/ SSI (0-9 units) Q4 hours     PCP: Dr. Yaakov Guthrie  Endocrinologist: Dr. Delrae Rend??  Per Dr. Nena Alexander H&P note, pt's husband removed pt's insulin pump and took it home.    MD- based on pt's current Insulin pump settings, recommend the following in-hospital insulin adjustments:  Increase Lantus to 25 units QHS (75% total home dose--gets about 33 units basal insulin on her pump Q24 hours)     Patient saw Jeanie Sewer, RD at the [place] [facility] on 06/05/2018.  Pump changes were made to pt's pump at that visit. --Insulin Pump Settings--  Basal Rates: 12am- 1.45 units/hr 4am- 0.850 units/hr 8am- 2 units/hr 10am- 1.65 units/hr 2pm- 1.35 units/hr  Total Basal Insulin per 24 hours period= 33.3 units  Carbohydrate Ratio:  12am- 1 unit for every 6 Grams of Carbohydrates 12pm- 1 unit for every 5 Grams of Carbohydrates  Correction/Sensitivity Factor: 1 unit for every 36 mg/dl above Target CBG  Target CBG: 100 mg/dl    --Will follow patient during hospitalization--  Wyn Quaker RN, MSN, CDE Diabetes  Coordinator Inpatient Glycemic Control Team Team Pager: [phone] (8a-5p)

## 2018-06-12 NOTE — Plan of Care (Signed)

## 2018-06-13 ENCOUNTER — Inpatient Hospital Stay (HOSPITAL_COMMUNITY): Payer: Medicare HMO

## 2018-06-13 DIAGNOSIS — I480 Paroxysmal atrial fibrillation: Principal | ICD-10-CM

## 2018-06-13 DIAGNOSIS — I10 Essential (primary) hypertension: Secondary | ICD-10-CM

## 2018-06-13 DIAGNOSIS — E119 Type 2 diabetes mellitus without complications: Secondary | ICD-10-CM

## 2018-06-13 DIAGNOSIS — I4891 Unspecified atrial fibrillation: Secondary | ICD-10-CM

## 2018-06-13 DIAGNOSIS — Z794 Long term (current) use of insulin: Secondary | ICD-10-CM

## 2018-06-13 LAB — BASIC METABOLIC PANEL
ANION GAP: 5 (ref 5–15)
BUN: 32 mg/dL — ABNORMAL HIGH (ref 6–20)
CO2: 20 mmol/L — ABNORMAL LOW (ref 22–32)
Calcium: 8.5 mg/dL — ABNORMAL LOW (ref 8.9–10.3)
Chloride: 118 mmol/L — ABNORMAL HIGH (ref 98–111)
Creatinine, Ser: 1.4 mg/dL — ABNORMAL HIGH (ref 0.44–1.00)
GFR calc Af Amer: 50 mL/min — ABNORMAL LOW (ref >60)
GFR, EST NON AFRICAN AMERICAN: 43 mL/min — AB (ref >60)
GLUCOSE: 72 mg/dL (ref 70–99)
POTASSIUM: 3.6 mmol/L (ref 3.5–5.1)
Sodium: 143 mmol/L (ref 135–145)

## 2018-06-13 LAB — CBC
HCT: 34.9 % — ABNORMAL LOW (ref 36.0–46.0)
HEMOGLOBIN: 11.2 g/dL — AB (ref 12.0–15.0)
MCH: 30.8 pg (ref 26.0–34.0)
MCHC: 32.1 g/dL (ref 30.0–36.0)
MCV: 95.9 fL (ref 80.0–100.0)
Platelets: 256 10*3/uL (ref 150–400)
RBC: 3.64 MIL/uL — ABNORMAL LOW (ref 3.87–5.11)
RDW: 13.5 % (ref 11.5–15.5)
WBC: 13.5 10*3/uL — AB (ref 4.0–10.5)
nRBC: 0 % (ref 0.0–0.2)

## 2018-06-13 LAB — GLUCOSE, CAPILLARY
GLUCOSE-CAPILLARY: 152 mg/dL — AB (ref 70–99)
Glucose-Capillary: 101 mg/dL — ABNORMAL HIGH (ref 70–99)
Glucose-Capillary: 106 mg/dL — ABNORMAL HIGH (ref 70–99)
Glucose-Capillary: 197 mg/dL — ABNORMAL HIGH (ref 70–99)
Glucose-Capillary: 41 mg/dL — CL (ref 70–99)
Glucose-Capillary: 58 mg/dL — ABNORMAL LOW (ref 70–99)
Glucose-Capillary: 76 mg/dL (ref 70–99)
Glucose-Capillary: 80 mg/dL (ref 70–99)

## 2018-06-13 LAB — TSH: TSH: 1.567 u[IU]/mL (ref 0.350–4.500)

## 2018-06-13 LAB — HEPARIN LEVEL (UNFRACTIONATED): Heparin Unfractionated: 0.65 IU/mL (ref 0.30–0.70)

## 2018-06-13 LAB — HEMOGLOBIN A1C
HEMOGLOBIN A1C: 5.7 % — AB (ref 4.8–5.6)
Mean Plasma Glucose: 116.89 mg/dL

## 2018-06-13 MED ORDER — DILTIAZEM HCL ER COATED BEADS 240 MG PO CP24
240.0000 mg | ORAL_CAPSULE | Freq: Every day | ORAL | Status: DC
  Administered 2018-06-13 – 2018-06-14 (×2): 240 mg via ORAL
  Filled 2018-06-13 (×2): qty 1

## 2018-06-13 MED ORDER — DILTIAZEM HCL 30 MG PO TABS
30.0000 mg | ORAL_TABLET | Freq: Four times a day (QID) | ORAL | Status: DC
  Administered 2018-06-13: 30 mg via ORAL
  Filled 2018-06-13: qty 1

## 2018-06-13 MED ORDER — SENNOSIDES-DOCUSATE SODIUM 8.6-50 MG PO TABS
1.0000 | ORAL_TABLET | Freq: Two times a day (BID) | ORAL | Status: DC
  Administered 2018-06-13 – 2018-06-14 (×2): 1 via ORAL
  Filled 2018-06-13 (×3): qty 1

## 2018-06-13 MED ORDER — POTASSIUM CHLORIDE CRYS ER 20 MEQ PO TBCR
40.0000 meq | EXTENDED_RELEASE_TABLET | Freq: Once | ORAL | Status: AC
  Administered 2018-06-13: 40 meq via ORAL
  Filled 2018-06-13: qty 2

## 2018-06-13 MED ORDER — APIXABAN 5 MG PO TABS
5.0000 mg | ORAL_TABLET | Freq: Two times a day (BID) | ORAL | Status: DC
  Administered 2018-06-13 – 2018-06-14 (×3): 5 mg via ORAL
  Filled 2018-06-13 (×3): qty 1

## 2018-06-13 NOTE — Progress Notes (Addendum)
ANTICOAGULATION CONSULT NOTE - Follow Up Consult  Pharmacy Consult for Heparin >> apixaban Indication: atrial fibrillation  Allergies  Allergen Reactions  . Acetazolamide Anaphylaxis and Other (See Comments)     Reaction, drop in Blood pressure that caused patient to stay in bed for 5 days, inconherient  Reaction, drop in Blood pressure that caused patient to stay in bed for 5 days, inconherient  . Ace Inhibitors Cough    Patient Measurements: Height: 4\' 9"  (144.8 cm) Weight: 149 lb 7.6 oz (67.8 kg) IBW/kg (Calculated) : 38.6 Heparin Dosing Weight: 54 kg  Vital Signs: Temp: 98.7 F (37.1 C) (11/28 0421) Temp Source: Oral (11/28 0421) BP: 119/52 (11/28 0421) Pulse Rate: 67 (11/28 0421)  Labs: Recent Labs    06/11/18 1112 06/11/18 1450 06/11/18 2153 06/12/18 0543 06/12/18 0551 06/12/18 1638 06/13/18 0559  HGB 14.2  --   --  12.9  --   --  11.2*  HCT 43.1  --   --  40.8  --   --  34.9*  PLT 262  --   --  286  --   --  256  APTT 27  --   --   --   --   --   --   LABPROT 12.0  --   --   --   --   --   --   INR 0.89  --   --   --   --   --   --   HEPARINUNFRC  --   --  0.70 0.59  --   --  0.65  CREATININE 1.07*  --   --  1.87*  --  1.94* 1.40*  TROPONINI  --  0.03*  --   --  0.03*  --   --     Estimated Creatinine Clearance: 37.3 mL/min (A) (by C-G formula based on SCr of 1.4 mg/dL (H)).   Medications:   Assessment: 52 y/o F admitted with AFIB with RVR. CHA2DS2/VAS= 3. Patient started on heparin infusion.    06/13/18  HL remains at goal  H/H decreased (patient has received a significant amount of IVF over the past 48h), Plts normal  No bleeding or issues with IV site per RN   Goal of Therapy:  Heparin level 0.3-0.7 units/ml Monitor platelets by anticoagulation protocol: Yes   Plan:  Continue heparin infusion at 850 units/hr.   Daily HL/CBC  Monitor for signs of bleeding  Napoleon Form 06/13/2018,11:22 AM   Pharmacy Addendum 06/13/18 12:46 PM   Physician entered orders to transition to apixaban. Will order apixaban 5 mg bid. RN instructed to stop heparin drip at time of first dose.   [name], PharmD Clinical Pharmacist Pager # [phone]

## 2018-06-13 NOTE — Consult Note (Signed)
Cardiology Consultation:   Patient ID: Tina [name] MRN: [id number]; DOB: March 11, 1966  Admit date: 06/11/2018 Date of Consult: 06/13/2018  Primary Care Provider: Vernie Shanks, MD Primary Cardiologist: Lauree Chandler, MD  Primary Electrophysiologist:  None    Patient Profile:   Tina [name] is a 52 y.o. female with a hx of DM, HTN, HLP, Turner's sd., symptomatic PACs  who is being seen today for the evaluation of new-onset atrial fibrillation at the request of Dr. Erlinda Hong.  History of Present Illness:   Tina [name] presented with nausea/vomiting, gait instability, dizziness and lightheadedness and was found to have atrial fibrillation with rapid ventricular response.  Rate control was obtained with intravenous diltiazem and she subsequently converted spontaneously to normal rhythm.  She continues to have mild nausea and a feeling of lightheadedness despite being in normal sinus rhythm.  She was not aware of any palpitations while she had atrial fibrillation, although she had symptomatic palpitations in the past related to PACs.  Rapid ventricular response occurred even though she was taking diltiazem sustained release 120 mg daily at home.  She does not have a history of stroke or TIA, but does have insulin requiring diabetes mellitus and hypertension.  She has not had a known stroke or TIA, but has numerous other serious health problems including legal blindness secondary to diabetic retinopathy.  She does not have a history of bleeding abnormalities.  Past Medical History:  Diagnosis Date  . Arthritis   . Cataract   . Diabetic retinopathy ([place])   . DM (diabetes mellitus), type 1 ([place])   . Dyspnea   . Elevated LFTs   . Hyperlipidemia   . Hypertension   . Legally blind in right eye, as defined in [place]   . Osteoporosis 11/2017   T score -3.8  . Retinal detachment   . Tachycardia   . Turner's syndrome   . Visual loss, bilateral    right eye light perception  only and left eye 20/100    Past Surgical History:  Procedure Laterality Date  . CATARACT EXTRACTION    . CHOLECYSTECTOMY    . Hepatic adenoma removed    . RETINAL DETACHMENT SURGERY    . right eye surgery      3 years ago  . TONSILLECTOMY       Home Medications:  Prior to Admission medications   Medication Sig Start Date End Date Taking? Authorizing Provider  diltiazem (CARDIZEM CD) 120 MG 24 hr capsule Take 1 capsule (120 mg total) by mouth daily. 02/06/18  Yes [name], MD  hydrochlorothiazide (HYDRODIURIL) 12.5 MG tablet TAKE 1 TABLET (12.5 MG TOTAL) BY MOUTH DAILY. Patient taking differently: Take 12.5 mg by mouth daily.  02/05/18  Yes [name], MD  Insulin Human (INSULIN PUMP) SOLN Inject 1 each into the skin continuous. Novolog 100 units/ml - basal rate 33 units/day and averaging 55 units/day with meals   Yes [provider]  losartan (COZAAR) 50 MG tablet TAKE 1 TABLET (50 MG TOTAL) BY MOUTH DAILY. 02/05/18  Yes [name], MD  magnesium oxide (MAG-OX) 400 MG tablet Take 400 mg by mouth daily.   Yes [provider]  methocarbamol (ROBAXIN) 500 MG tablet Take 500 mg by mouth 4 (four) times daily as needed for muscle spasms.  12/11/17  Yes [provider]  rosuvastatin (CRESTOR) 20 MG tablet Take 20 mg by mouth daily.   Yes [provider]  triamcinolone cream (KENALOG) 0.1 % Apply 1 application  topically 2 (two) times daily as needed (ezcema). Arms and hands 09/14/17  Yes [provider]  Vitamin D, Ergocalciferol, (DRISDOL) 50000 units CAPS capsule Take 1 capsule (50,000 Units total) by mouth every 7 (seven) days. 02/12/18  Yes Princess Bruins, MD  Continuous Blood Gluc Receiver (FREESTYLE LIBRE READER) DEVI by Does not apply route. 12/21/16   [provider]  Continuous Blood Gluc Sensor ([place]) MISC by Does not apply route. 12/21/16   [provider]    glucose blood (ONE TOUCH ULTRA TEST) test strip USE TO TEST 6 TO 8 TIMES DAILY AS NEEDED 09/17/17   [provider]  glucose blood test strip USE TO TEST 6 TO 8 TIMES DAILY AS NEEDED 07/20/14   [provider]    Inpatient Medications: Scheduled Meds: . apixaban  5 mg Oral BID  . diltiazem  30 mg Oral Q6H  . insulin pump   Subcutaneous TID AC, HS, 0200  . metoCLOPramide (REGLAN) injection  5 mg Intravenous Q8H  . rosuvastatin  20 mg Oral q1800  . senna-docusate  1 tablet Oral BID  . sodium chloride flush  3 mL Intravenous Q12H   Continuous Infusions: . sodium chloride 100 mL/hr at 06/13/18 0300  . diltiazem (CARDIZEM) infusion 15 mg/hr (06/13/18 0911)  . famotidine (PEPCID) IV Stopped (06/12/18 2232)  . heparin 850 Units/hr (06/13/18 0300)   PRN Meds: acetaminophen **OR** acetaminophen, albuterol, ondansetron **OR** ondansetron (ZOFRAN) IV  Allergies:    Allergies  Allergen Reactions  . Acetazolamide Anaphylaxis and Other (See Comments)     Reaction, drop in Blood pressure that caused patient to stay in bed for 5 days, inconherient  Reaction, drop in Blood pressure that caused patient to stay in bed for 5 days, inconherient  . Ace Inhibitors Cough    Social History:   Social History   Socioeconomic History  . Marital status: Married    Spouse name: Not on file  . Number of children: Not on file  . Years of education: Not on file  . Highest education level: Not on file  Occupational History  . Occupation: Disabled  Social Needs  . Financial resource strain: Not on file  . Food insecurity:    Worry: Not on file    Inability: Not on file  . Transportation needs:    Medical: Not on file    Non-medical: Not on file  Tobacco Use  . Smoking status: Former Smoker    Packs/day: 0.20    Years: 0.50    Pack years: 0.10    Types: Cigarettes    Last attempt to quit: 07/17/1988    Years since quitting: 29.9  . Smokeless tobacco: Never Used  . Tobacco  comment: many years ago  Substance and Sexual Activity  . Alcohol use: Yes    Comment: social  . Drug use: No  . Sexual activity: Yes    Partners: Male    Comment: 1ST INTERCOURSE- 83, partners- 68, married- 24 yrs   Lifestyle  . Physical activity:    Days per week: Not on file    Minutes per session: Not on file  . Stress: Not on file  Relationships  . Social connections:    Talks on phone: Not on file    Gets together: Not on file    Attends religious service: Not on file    Active member of club or organization: Not on file    Attends meetings of clubs or organizations: Not  on file    Relationship status: Not on file  . Intimate partner violence:    Fear of current or ex partner: Not on file    Emotionally abused: Not on file    Physically abused: Not on file    Forced sexual activity: Not on file  Other Topics Concern  . Not on file  Social History Narrative   Married, disabled due to visual problems. She is legally blind.   No children.   No caffeine reported.    Family History:    Family History  Problem Relation Age of Onset  . Cancer Father        kidney  . Diabetes Maternal Grandmother   . Emphysema Paternal Grandfather   . Esophageal cancer Paternal Aunt   . Colon cancer Neg Hx   . Colon polyps Neg Hx   . Rectal cancer Neg Hx   . Stomach cancer Neg Hx      ROS:  Please see the history of present illness.   All other ROS reviewed and negative.     Physical Exam/Data:   Vitals:   06/12/18 0422 06/12/18 1413 06/12/18 2139 06/13/18 0421  BP: (!) 139/59 (!) 121/56 (!) 188/87 (!) 119/52  Pulse: 70 73 76 67  Resp: 18 18 20 18   Temp: 98.2 F (36.8 C) 98.9 F (37.2 C) 98.2 F (36.8 C) 98.7 F (37.1 C)  TempSrc: Oral Oral Oral Oral  SpO2: 98% 93% 91% 95%  Weight:      Height:        Intake/Output Summary (Last 24 hours) at 06/13/2018 1249 Last data filed at 06/13/2018 0300 Gross per 24 hour  Intake 1774.28 ml  Output 1 ml  Net 1773.28 ml    Filed Weights   06/11/18 1844  Weight: 67.8 kg   Body mass index is 32.35 kg/m.  General:   well developed, in no acute distress, obese HEENT: normal, strabismus, mild hirsutism Lymph: no adenopathy Neck: no JVD Endocrine:  No thryomegaly Vascular: No carotid bruits; FA pulses 2+ bilaterally without bruits  Cardiac:  normal S1, S2; RRR; no murmur  Lungs:  clear to auscultation bilaterally, no wheezing, rhonchi or rales  Abd: soft, nontender, no hepatomegaly  Ext: no edema Musculoskeletal:  No deformities, BUE and BLE strength normal and equal Skin: warm and dry  Neuro:  CNs 2-12 intact, no focal abnormalities noted Psych:  Normal affect   EKG:  The EKG was personally reviewed and demonstrates:  atrial fibrillation, RVR, subsequent ECG NSR, LAA. Telemetry:  Telemetry was personally reviewed and demonstrates: Sinus rhythm  Relevant CV Studies: ECHO 06/12/2018 - Left ventricle: The cavity size was normal. Systolic function was   normal. The estimated ejection fraction was in the range of 60%   to 65%. Wall motion was normal; there were no regional wall   motion abnormalities. Abnormal relaxation with increased filling   pressures. - Aortic valve: There was no regurgitation. - Aortic root: The aortic root was normal in size. - Mitral valve: There was trivial regurgitation. - Right ventricle: Systolic function was normal. - Atrial septum: No defect or patent foramen ovale was identified. - Tricuspid valve: There was no significant regurgitation. - Inferior vena cava: The vessel was normal in size. The   respirophasic diameter changes were in the normal range (>= 50%),   consistent with normal central venous pressure. - Pericardium, extracardiac: A trivial pericardial effusion was   identified.  Impressions:  - Normal LV EF, no significant  valvular abnormalities.  MYOVIEW 07/11/2016  The left ventricular ejection fraction is normal (55-65%).  Nuclear stress EF:  65%.  No T wave inversion was noted during stress.  The study is normal.     Laboratory Data:  Chemistry Recent Labs  Lab 06/12/18 0543 06/12/18 1638 06/13/18 0559  NA 143 139 143  K 4.1 3.9 3.6  CL 114* 112* 118*  CO2 14* 21* 20*  GLUCOSE 341* 234* 72  BUN 44* 50* 32*  CREATININE 1.87* 1.94* 1.40*  CALCIUM 8.6* 8.3* 8.5*  GFRNONAA 30* 29* 43*  GFRAA 35* 34* 50*  ANIONGAP 15 6 5     Recent Labs  Lab 06/11/18 1112  PROT 7.3  ALBUMIN 3.5  AST 23  ALT 33  ALKPHOS 123  BILITOT 1.2   Hematology Recent Labs  Lab 06/11/18 1112 06/12/18 0543 06/13/18 0559  WBC 16.3* 20.6* 13.5*  RBC 4.65 4.23 3.64*  HGB 14.2 12.9 11.2*  HCT 43.1 40.8 34.9*  MCV 92.7 96.5 95.9  MCH 30.5 30.5 30.8  MCHC 32.9 31.6 32.1  RDW 13.2 13.3 13.5  PLT 262 286 256   Cardiac Enzymes Recent Labs  Lab 06/11/18 1450 06/12/18 0551  TROPONINI 0.03* 0.03*   No results for input(s): TROPIPOC in the last 168 hours.  BNPNo results for input(s): BNP, PROBNP in the last 168 hours.  DDimer No results for input(s): DDIMER in the last 168 hours.  Radiology/Studies:  Dg Abd 1 View  Result Date: 06/12/2018 CLINICAL DATA:  Nausea and vomiting EXAM: ABDOMEN - 1 VIEW COMPARISON:  None. FINDINGS: Nonobstructed bowel-gas pattern. Surgical clips in the right upper quadrant. No radiopaque calculi. IMPRESSION: Nonobstructed bowel-gas pattern. Electronically Signed   By: Donavan Foil M.D.   On: 06/12/2018 20:28   Ct Head Wo Contrast  Result Date: 06/11/2018 CLINICAL DATA:  Altered level of consciousness. EXAM: CT HEAD WITHOUT CONTRAST TECHNIQUE: Contiguous axial images were obtained from the base of the skull through the vertex without intravenous contrast. COMPARISON:  None. FINDINGS: Brain: Well-defined low-density in the left thalamus consistent with remote lacunar infarct. Small remote left superior cerebellar infarct. Mild low-density in the cerebral white matter, presumably chronic small vessel  ischemia. No hemorrhage, cortical infarct, hydrocephalus, or masslike finding. Vascular: No hyperdense vessel or unexpected calcification. Skull: Normal. Negative for fracture or focal lesion. Sinuses/Orbits: Left cataract resection and right enucleation. IMPRESSION: 1. No acute finding. 2. Remote appearing small vessel infarcts. Electronically Signed   By: Monte Fantasia M.D.   On: 06/11/2018 14:02   US Renal  Result Date: 06/13/2018 CLINICAL DATA:  Elevated creatinine EXAM: RENAL / URINARY TRACT ULTRASOUND COMPLETE COMPARISON:  None. FINDINGS: Right Kidney: Renal measurements: 10.1 x 5.5 x 5.4 cm = volume: 156 mL. Mild cortical thinning is noted. No focal mass or hydronephrosis is seen. Left Kidney: Renal measurements: 12.0 x 5.3 x 5.8 cm = volume: 193 mL. Mild cortical thinning is noted. No focal mass or hydronephrosis is noted. Bladder: Appears normal for degree of bladder distention. IMPRESSION: Mild cortical thinning.  No obstructive changes are seen. Electronically Signed   By: Inez Catalina M.D.   On: 06/13/2018 10:02   Dg Chest Portable 1 View  Result Date: 06/11/2018 CLINICAL DATA:  Nausea, vomiting, diarrhea, dizziness today, former smoking history many years ago EXAM: PORTABLE CHEST 1 VIEW COMPARISON:  Chest x-ray of 08/30/2015 FINDINGS: On this portable view, there are somewhat prominent markings medially at the left lung base which could be vascular, but a developing pneumonia cannot  be excluded and two-view chest x-ray is recommended. The right lung appears clear. No effusion is seen. Mediastinal and hilar contours are unremarkable and the heart is borderline enlarged. No bony abnormality is seen. IMPRESSION: Cannot exclude pneumonia medially at the left lung base. Recommend two-view chest x-ray if possible. Electronically Signed   By: Ivar Drape M.D.   On: 06/11/2018 11:54    Assessment and Plan:   1. Parox AFib: Judging by how her symptoms evolved, this was more likely the  consequence of hypovolemia and GI distress, rather than the cause.  She still has nausea and lightheadedness although she is back in normal rhythm for several hours.  The arrhythmia self-terminated on iv diltiazem. CHADSVasc 3 (gender, HTN, DM).  Started on apixaban. Increase home dose of diltiazem for rate control during future episodes. Can reduce the losartan if BP drops too low. 2. HTN: controlled. 3. DM: requiring insulin 4. HLP:  On statin. 5. Social/financial issues: Both patient and her husband are disabled and they have a lot of difficulty affording medications, including very expensive insulin.  She is reluctant to take a brand-name medication.  Her husband takes warfarin.  We reviewed the fact that Eliquis or other DO ACs are superior to warfarin with a lower risk of serious intracranial hemorrhage, fewer drug interactions and virtually no food interaction and that they do not require routine monitoring.  We will try to get her a 30-day free card and encouraged her to apply for financial assistance via the manufacturer's program, switching to warfarin only as a less favorable final solution.  [facility] will sign off.   Medication Recommendations: Diltiazem sustained release 240 mg once daily, apixaban 5 mg twice daily.  Other recommendations (labs, testing, etc): Decrease losartan to 25 mg once daily if she develops symptomatic hypotension Follow up as an outpatient: We will arrange follow-up with Dr. Angelena Form or 1 of our APP's in the next couple of weeks.  For questions or updates, please contact [place] Please consult [email or web address] for contact info under     Signed, Sanda Klein, MD  06/13/2018 12:49 PM

## 2018-06-13 NOTE — Progress Notes (Addendum)
PROGRESS NOTE  Tina [name] KNL:[id number] DOB: 19-Aug-1965 DOA: 06/11/2018 PCP: Vernie Shanks, MD  HPI/Recap of past 24 hours:   No more vomiting last night, feeling less nauseous,  Less dizzy when she stands up she wants to use her own pump, had one episode of hypoglycemia  Assessment/Plan: Principal Problem:   Atrial fibrillation with RVR ([place]) Active Problems:   History of retinal detachment   HTN (hypertension)   IDDM (insulin dependent diabetes mellitus) ([facility])  Nausea vomiting -diabetic gastroparesis vs viral gastroenteritis - supportive treatment with antiemetics, IV fluids,  Reglan -improving, advance diet as tolerated,  Dizziness: Ct head "1. No acute finding. 2. Remote appearing small vessel infarcts." She does has hearing impairment  But she denies tinnitus Reports dizziness when standing up, likely from dehydration, continue hydration  AKI on CKD 2 -UA no bacteria,  - renal ultrasound no obstruction -Continue hydration, cr improving -Renal dosing meds  New diagnosis of A. fib RVR  -She is converted to sinus rhythm on Cardizem drip, she stayed on  Cardizem drip and heparin drip on 11/27 due to nausea and vomiting -Echocardiogram left ventricular EF within normal limits -transition to oral cardizem and eliquis now she can eat, cardiology consulted  Leukocytosis -No fever,  blood culture no growth, ua no bacteria, no cough, no diarrhea -continue hydration, wbc improving  Type 1 diabetes -She report recent A1c 5.8 -She prefers to use her own pump -Diabetes RN input appreciated    Code Status: full  Family Communication: patient   Disposition Plan: home with home health once medically stable and cleared  By cardiology, likey in 1-2 days   Consultants:  cardiology  Procedures:  none  Antibiotics:  none   Objective: BP (!) 119/52 (BP Location: Left Arm)   Pulse 67   Temp 98.7 F (37.1 C) (Oral)   Resp 18   Ht 4\' 9"  (1.448 m)    Wt 67.8 kg   SpO2 95%   BMI 32.35 kg/m   Intake/Output Summary (Last 24 hours) at 06/13/2018 0830 Last data filed at 06/13/2018 0300 Gross per 24 hour  Intake 1996.28 ml  Output 1 ml  Net 1995.28 ml   Filed Weights   06/11/18 1844  Weight: 67.8 kg    Exam: Patient is examined daily including today on 06/13/2018, exams remain the same as of yesterday except that has changed    General:  NAD, legally blind ( Prosthetic Right eye, left eye poor vision), hard of hearing  Cardiovascular: RRR  Respiratory: CTABL  Abdomen: Soft/ND/NT, positive BS  Musculoskeletal: No Edema  Neuro: alert, oriented   Data Reviewed: Basic Metabolic Panel: Recent Labs  Lab 06/11/18 1112 06/12/18 0543 06/12/18 1638 06/13/18 0559  NA 140 143 139 143  K 4.2 4.1 3.9 3.6  CL 108 114* 112* 118*  CO2 21* 14* 21* 20*  GLUCOSE 257* 341* 234* 72  BUN 36* 44* 50* 32*  CREATININE 1.07* 1.87* 1.94* 1.40*  CALCIUM 9.1 8.6* 8.3* 8.5*   Liver Function Tests: Recent Labs  Lab 06/11/18 1112  AST 23  ALT 33  ALKPHOS 123  BILITOT 1.2  PROT 7.3  ALBUMIN 3.5   Recent Labs  Lab 06/11/18 1112  LIPASE 23   No results for input(s): AMMONIA in the last 168 hours. CBC: Recent Labs  Lab 06/11/18 1112 06/12/18 0543 06/13/18 0559  WBC 16.3* 20.6* 13.5*  NEUTROABS 14.5*  --   --   HGB 14.2 12.9 11.2*  HCT 43.1 40.8  34.9*  MCV 92.7 96.5 95.9  PLT 262 286 256   Cardiac Enzymes:   Recent Labs  Lab 06/11/18 1450 06/12/18 0551  TROPONINI 0.03* 0.03*   BNP (last 3 results) No results for input(s): BNP in the last 8760 hours.  ProBNP (last 3 results) No results for input(s): PROBNP in the last 8760 hours.  CBG: Recent Labs  Lab 06/12/18 2207 06/13/18 0252 06/13/18 0422 06/13/18 0733 06/13/18 0816  GLUCAP 180* 101* 80 58* 152*    No results found for this or any previous visit (from the past 240 hour(s)).   Studies: Dg Abd 1 View  Result Date: 06/12/2018 CLINICAL DATA:   Nausea and vomiting EXAM: ABDOMEN - 1 VIEW COMPARISON:  None. FINDINGS: Nonobstructed bowel-gas pattern. Surgical clips in the right upper quadrant. No radiopaque calculi. IMPRESSION: Nonobstructed bowel-gas pattern. Electronically Signed   By: Donavan Foil M.D.   On: 06/12/2018 20:28    Scheduled Meds: . insulin pump   Subcutaneous TID AC, HS, 0200  . metoCLOPramide (REGLAN) injection  5 mg Intravenous Q8H  . potassium chloride  40 mEq Oral Once  . rosuvastatin  20 mg Oral q1800  . sodium chloride flush  3 mL Intravenous Q12H    Continuous Infusions: . sodium chloride 100 mL/hr at 06/13/18 0300  . diltiazem (CARDIZEM) infusion 15 mg/hr (06/13/18 0300)  . famotidine (PEPCID) IV Stopped (06/12/18 2232)  . heparin 850 Units/hr (06/13/18 0300)     Time spent: 35 mins I have personally reviewed and interpreted on  06/13/2018 daily labs, tele strips, imagings as discussed above under date review session and assessment and plans.  I reviewed all nursing notes, pharmacy notes, vitals, pertinent old records  I have discussed plan of care as described above with RN , patient on 06/13/2018   Florencia Reasons MD, PhD  [facility] Pager [phone]. If 7PM-7AM, please contact night-coverage at [email or web address], password [facility] 06/13/2018, 8:30 AM  LOS: 2 days

## 2018-06-13 NOTE — Progress Notes (Signed)
Hypoglycemic Event  CBG: 58  Treatment:15 gm carb snack  Symptoms:None  Follow-up CBG: Time 08:20 CBG Result:152  Possible Reasons for Event:Inadequate PO Intake  Comments/MD notified Dr. Kathryne Eriksson, [place] Billing

## 2018-06-14 ENCOUNTER — Encounter: Payer: Self-pay | Admitting: Physician Assistant

## 2018-06-14 ENCOUNTER — Telehealth: Payer: Self-pay | Admitting: Nurse Practitioner

## 2018-06-14 LAB — MAGNESIUM: Magnesium: 1.9 mg/dL (ref 1.7–2.4)

## 2018-06-14 LAB — CBC WITH DIFFERENTIAL/PLATELET
Abs Immature Granulocytes: 0.1 10*3/uL — ABNORMAL HIGH (ref 0.00–0.07)
Basophils Absolute: 0.1 10*3/uL (ref 0.0–0.1)
Basophils Relative: 1 %
EOS ABS: 0 10*3/uL (ref 0.0–0.5)
EOS PCT: 0 %
HCT: 36.1 % (ref 36.0–46.0)
Hemoglobin: 12 g/dL (ref 12.0–15.0)
Immature Granulocytes: 1 %
Lymphocytes Relative: 11 %
Lymphs Abs: 1.3 10*3/uL (ref 0.7–4.0)
MCH: 30.8 pg (ref 26.0–34.0)
MCHC: 33.2 g/dL (ref 30.0–36.0)
MCV: 92.8 fL (ref 80.0–100.0)
Monocytes Absolute: 1.1 10*3/uL — ABNORMAL HIGH (ref 0.1–1.0)
Monocytes Relative: 9 %
Neutro Abs: 9.6 10*3/uL — ABNORMAL HIGH (ref 1.7–7.7)
Neutrophils Relative %: 78 %
Platelets: 208 10*3/uL (ref 150–400)
RBC: 3.89 MIL/uL (ref 3.87–5.11)
RDW: 13.3 % (ref 11.5–15.5)
WBC: 12.2 10*3/uL — ABNORMAL HIGH (ref 4.0–10.5)
nRBC: 0 % (ref 0.0–0.2)

## 2018-06-14 LAB — GLUCOSE, CAPILLARY
GLUCOSE-CAPILLARY: 166 mg/dL — AB (ref 70–99)
Glucose-Capillary: 103 mg/dL — ABNORMAL HIGH (ref 70–99)
Glucose-Capillary: 122 mg/dL — ABNORMAL HIGH (ref 70–99)
Glucose-Capillary: 137 mg/dL — ABNORMAL HIGH (ref 70–99)
Glucose-Capillary: 62 mg/dL — ABNORMAL LOW (ref 70–99)
Glucose-Capillary: 98 mg/dL (ref 70–99)

## 2018-06-14 LAB — BASIC METABOLIC PANEL
Anion gap: 6 (ref 5–15)
BUN: 12 mg/dL (ref 6–20)
CO2: 23 mmol/L (ref 22–32)
Calcium: 8.6 mg/dL — ABNORMAL LOW (ref 8.9–10.3)
Chloride: 111 mmol/L (ref 98–111)
Creatinine, Ser: 0.91 mg/dL (ref 0.44–1.00)
GFR calc Af Amer: >60 mL/min (ref >60)
Glucose, Bld: 145 mg/dL — ABNORMAL HIGH (ref 70–99)
Potassium: 3.6 mmol/L (ref 3.5–5.1)
Sodium: 140 mmol/L (ref 135–145)

## 2018-06-14 MED ORDER — WARFARIN SODIUM 5 MG PO TABS: 5.0000 mg | ORAL_TABLET | Freq: Every day | ORAL | 3 refills | Status: DC

## 2018-06-14 MED ORDER — METOCLOPRAMIDE HCL 10 MG PO TABS: 10.0000 mg | ORAL_TABLET | Freq: Three times a day (TID) | ORAL | 0 refills | Status: DC | PRN

## 2018-06-14 MED ORDER — FAMOTIDINE 20 MG PO TABS
20.0000 mg | ORAL_TABLET | Freq: Two times a day (BID) | ORAL | Status: DC
  Administered 2018-06-14: 20 mg via ORAL
  Filled 2018-06-14: qty 1

## 2018-06-14 MED ORDER — DILTIAZEM HCL ER COATED BEADS 240 MG PO CP24: 240.0000 mg | ORAL_CAPSULE | Freq: Every day | ORAL | 0 refills | Status: DC

## 2018-06-14 MED ORDER — APIXABAN 5 MG PO TABS: 5.0000 mg | ORAL_TABLET | Freq: Two times a day (BID) | ORAL | 0 refills | Status: DC

## 2018-06-14 NOTE — Telephone Encounter (Signed)
   Pts husband called to report that she was recently d/c'd from [facility] on Eliquis and provided a 30 day card (D/C summary and cardiology consult note reviewed).  Pts pharmacy was not able to fill b/c she has previously used a 30 day free card.  Pt and husband are now asking if she can just take coumadin.  Husband would like to give her one of his doses.  We discussed options for anticoagulation going forward.  I will send in a Rx for warfarin 5mg  qpm, #30, 3 refills.  I am also sending a message to the office as she will require an INR check on Tuesday 12/3 and f/u with Dr. Angelena Form within the next 2 wks.  Caller verbalized understanding and was grateful for the call back.  Murray Hodgkins, NP 06/14/2018, 4:10 PM

## 2018-06-14 NOTE — Discharge Summary (Signed)
Discharge Summary  Tina [name] ZTI:[id number] DOB: [birth year]  PCP: Vernie Shanks, MD  Admit date: 06/11/2018 Discharge date: 06/14/2018  Time spent: 63mins, more than 50% time spent on coordination of care.  Recommendations for Outpatient Follow-up:  1. F/u with PCP within a week  for hospital discharge follow up, repeat cbc/bmp at follow up 2. F/u with cardiology 3. F/u with endocrinology Dr Earline Mayotte 4. Home health arranged  Discharge Diagnoses:  Active Hospital Problems   Diagnosis Date Noted  . Atrial fibrillation with RVR ([place]) 06/11/2018  . IDDM (insulin dependent diabetes mellitus) ([place]) 11/17/2013  . HTN (hypertension) 08/24/2011  . History of retinal detachment 08/24/2011    Resolved Hospital Problems  No resolved problems to display.    Discharge Condition: stable  Diet recommendation: heart healthy/carb modified  Filed Weights   06/11/18 1844  Weight: 67.8 kg    History of present illness: (per admitting MD Dr Sloan Leiter)  CHIEF COMPLAINT:  Vomiting  HPI: Tina [name] is a 52 y.o. female with medical history significant of DM-1 on insulin pump, hypertension, Turner syndrome, legal blindness presented to the ED for evaluation of the above-noted complaints.  Please note both the patient and her spouse at bedside are very poor historians.  Apparently for the past few months patient has been much more sleepier than usual.  This morning, patient started having numerous episodes of vomiting.  While attempting to get off the chair this afternoon she became very dizzy.  She was subsequently brought to the emergency room, where she was found to have A. fib with RVR.  ED MD started the patient on Cardizem infusion and on IV heparin.  ED MD also has spoken with cardiology-who advised inpatient admission, cardiology will subsequently see patient tomorrow morning.  During my evaluation-patient seemed comfortable-vomited x1 while I was in the room.  She denies any  abdominal pain.  She denies any diarrhea.  There was no chest pain or shortness of breath.  There is no history of fever.  Patient significant other has already disconnected patient's insulin pump and plans to take it home   ED Course:  Found to have A. fib with RVR-started on Cardizem infusion and IV heparin.  Hospitalist service asked to admit this patient for further evaluation and treatment.  Hospital Course:  Principal Problem:   Atrial fibrillation with RVR (HCC) Active Problems:   History of retinal detachment   HTN (hypertension)   IDDM (insulin dependent diabetes mellitus) (HCC)  Nausea vomiting -diabetic gastroparesis vs viral gastroenteritis - supportive treatment with bowel rest, antiemetics, IV fluids,  Reglan -improved, tolerated regular diet, d/c home with prn reglan F/u with gi for gastroparesis  Dizziness: Ct head "1. No acute finding. 2. Remote appearing small vessel infarcts." She does has hearing impairment  But she denies tinnitus Reports dizziness when standing up, likely from dehydration, continue hydration Improved with hydration  AKI on CKD 2 -UA no bacteria,  - renal ultrasound no obstruction -Continue hydration, cr normalied -Renal dosing meds  New diagnosis of A. fib RVR  -She is converted to sinus rhythm on Cardizem drip, she stayed on  Cardizem drip and heparin drip on 11/27 due to nausea and vomiting -Echocardiogram left ventricular EF within normal limits -transition to oral cardizem and eliquis , cardiology consulted, input appreciated, she is to follow up with cardiology  Leukocytosis -No fever,  blood culture no growth, ua no bacteria, no cough, no diarrhea -continue hydration, wbc improving down from 20.6 to 12.2  Type  1 diabetes -She report recent A1c 5.8 -She prefers to use her own pump -Diabetes RN input appreciated    Code Status: full  Family Communication: patient and husband at bedside  Disposition Plan: home  with home health with cardiology clearance   Consultants:  cardiology  Procedures:  none  Antibiotics:  none   Discharge Exam: BP (!) 165/82   Pulse 82   Temp 98.7 F (37.1 C) (Oral)   Resp 18   Ht 4\' 9"  (1.448 m)   Wt 67.8 kg   SpO2 95%   BMI 32.35 kg/m    General:  NAD, legally blind ( Prosthetic Right eye, left eye poor vision), hard of hearing  Cardiovascular: RRR  Respiratory: CTABL  Abdomen: Soft/ND/NT, positive BS  Musculoskeletal: No Edema  Neuro: alert, oriented    Discharge Instructions You were cared for by a hospitalist during your hospital stay. If you have any questions about your discharge medications or the care you received while you were in the hospital after you are discharged, you can call the unit and asked to speak with the hospitalist on call if the hospitalist that took care of you is not available. Once you are discharged, your primary care physician will handle any further medical issues. Please note that NO REFILLS for any discharge medications will be authorized once you are discharged, as it is imperative that you return to your primary care physician (or establish a relationship with a primary care physician if you do not have one) for your aftercare needs so that they can reassess your need for medications and monitor your lab values.  Discharge Instructions    Diet - low sodium heart healthy   Complete by:  As directed    Carb modified   Increase activity slowly   Complete by:  As directed      Allergies as of 06/14/2018      Reactions   Acetazolamide Anaphylaxis, Other (See Comments)    Reaction, drop in Blood pressure that caused patient to stay in bed for 5 days, inconherient  Reaction, drop in Blood pressure that caused patient to stay in bed for 5 days, inconherient   Ace Inhibitors Cough      Medication List    STOP taking these medications   hydrochlorothiazide 12.5 MG tablet Commonly known as:   HYDRODIURIL     TAKE these medications   diltiazem 240 MG 24 hr capsule Commonly known as:  CARDIZEM CD Take 1 capsule (240 mg total) by mouth daily. What changed:    medication strength  how much to take   [place] by Does not apply route.   [place] by Does not apply route.   glucose blood test strip USE TO TEST 6 TO 8 TIMES DAILY AS NEEDED   ONE TOUCH ULTRA TEST test strip Generic drug:  glucose blood USE TO TEST 6 TO 8 TIMES DAILY AS NEEDED   insulin pump Soln Inject 1 each into the skin continuous. Novolog 100 units/ml - basal rate 33 units/day and averaging 55 units/day with meals   losartan 50 MG tablet Commonly known as:  COZAAR TAKE 1 TABLET (50 MG TOTAL) BY MOUTH DAILY. Notes to patient:  Please resume at home   magnesium oxide 400 MG tablet Commonly known as:  MAG-OX Take 400 mg by mouth daily. Notes to patient:  Please resume this Medication at home   methocarbamol 500 MG tablet Commonly known as:  ROBAXIN  Take 500 mg by mouth 4 (four) times daily as needed for muscle spasms.   metoCLOPramide 10 MG tablet Commonly known as:  REGLAN Take 1 tablet (10 mg total) by mouth every 8 (eight) hours as needed for nausea.   rosuvastatin 20 MG tablet Commonly known as:  CRESTOR Take 20 mg by mouth daily. Notes to patient:  Please take this Medication this evening 06/14/2018 at Dinner   triamcinolone cream 0.1 % Commonly known as:  KENALOG Apply 1 application topically 2 (two) times daily as needed (ezcema). Arms and hands   Vitamin D (Ergocalciferol) 1.25 MG (50000 UT) Caps capsule Commonly known as:  DRISDOL Take 1 capsule (50,000 Units total) by mouth every 7 (seven) days. Notes to patient:  Resume at home 1 capsule every 7 days      Allergies  Allergen Reactions  . Acetazolamide Anaphylaxis and Other (See Comments)     Reaction, drop in Blood pressure that caused patient to stay in bed for 5 days,  inconherient  Reaction, drop in Blood pressure that caused patient to stay in bed for 5 days, inconherient  . Ace Inhibitors Cough   Follow-up Information    Vernie Shanks, MD Follow up in 1 week(s).   Specialty:  Family Medicine Why:  hospital discharge follow up, repeat cbc/bmp at follow up. Contact information: [place] [address] [phone]        [name], MD Follow up.   Specialty:  Cardiology Why:  afib management Contact information: [place]. [address] [place] [address] [phone]        Delrae Rend, MD Follow up.   Specialty:  Endocrinology Why:  diabetes management  Contact information: [address] [place] [address] [phone]        Jerene Bears, MD Follow up.   Specialty:  Gastroenterology Why:  [place] [facility] /n/v  Contact information: [address] [address] [phone]        [facility] Follow up.   Specialty:  [place] Why:  [facility] nursing/physical therapy Contact information: [address] [place] [address] [phone]            The results of significant diagnostics from this hospitalization (including imaging, microbiology, ancillary and laboratory) are listed below for reference.    Significant Diagnostic Studies: Dg Abd 1 View  Result Date: 06/12/2018 CLINICAL DATA:  Nausea and vomiting EXAM: ABDOMEN - 1 VIEW COMPARISON:  None. FINDINGS: Nonobstructed bowel-gas pattern. Surgical clips in the right upper quadrant. No radiopaque calculi. IMPRESSION: Nonobstructed bowel-gas pattern. Electronically Signed   By: Donavan Foil M.D.   On: 06/12/2018 20:28   Ct Head Wo Contrast  Result Date: 06/11/2018 CLINICAL DATA:  Altered level of consciousness. EXAM: CT HEAD WITHOUT CONTRAST TECHNIQUE: Contiguous axial images were obtained from the base of the skull through the vertex without intravenous  contrast. COMPARISON:  None. FINDINGS: Brain: Well-defined low-density in the left thalamus consistent with remote lacunar infarct. Small remote left superior cerebellar infarct. Mild low-density in the cerebral white matter, presumably chronic small vessel ischemia. No hemorrhage, cortical infarct, hydrocephalus, or masslike finding. Vascular: No hyperdense vessel or unexpected calcification. Skull: Normal. Negative for fracture or focal lesion. Sinuses/Orbits: Left cataract resection and right enucleation. IMPRESSION: 1. No acute finding. 2. Remote appearing small vessel infarcts. Electronically Signed   By: Monte Fantasia M.D.   On: 06/11/2018 14:02   US Renal  Result Date: 06/13/2018 CLINICAL DATA:  Elevated creatinine EXAM: RENAL / URINARY TRACT ULTRASOUND COMPLETE COMPARISON:  None. FINDINGS: Right Kidney: Renal measurements: 10.1 x 5.5 x 5.4 cm = volume: 156 mL. Mild cortical thinning is noted. No focal mass or hydronephrosis is seen. Left Kidney: Renal measurements: 12.0 x 5.3 x 5.8 cm = volume: 193 mL. Mild cortical thinning is noted. No focal mass or hydronephrosis is noted. Bladder: Appears normal for degree of bladder distention. IMPRESSION: Mild cortical thinning.  No obstructive changes are seen. Electronically Signed   By: Inez Catalina M.D.   On: 06/13/2018 10:02   Dg Chest Portable 1 View  Result Date: 06/11/2018 CLINICAL DATA:  Nausea, vomiting, diarrhea, dizziness today, former smoking history many years ago EXAM: PORTABLE CHEST 1 VIEW COMPARISON:  Chest x-ray of 08/30/2015 FINDINGS: On this portable view, there are somewhat prominent markings medially at the left lung base which could be vascular, but a developing pneumonia cannot be excluded and two-view chest x-ray is recommended. The right lung appears clear. No effusion is seen. Mediastinal and hilar contours are unremarkable and the heart is borderline enlarged. No bony abnormality is seen. IMPRESSION: Cannot exclude pneumonia  medially at the left lung base. Recommend two-view chest x-ray if possible. Electronically Signed   By: Ivar Drape M.D.   On: 06/11/2018 11:54    Microbiology: Recent Results (from the past 240 hour(s))  Culture, blood (routine x 2)     Status: None (Preliminary result)   Collection Time: 06/12/18  7:17 PM  Result Value Ref Range Status   Specimen Description   Final    BLOOD RIGHT ANTECUBITAL Performed at [facility] [address], [place], [place] [address]    Special Requests   Final    BOTTLES DRAWN AEROBIC AND ANAEROBIC Blood Culture adequate volume Performed at [facility] [address] [place], [place] [address]    Culture   Final    NO GROWTH 2 DAYS Performed at [facility] [address], [place], [place] [address]    Report Status PENDING  Incomplete  Culture, blood (routine x 2)     Status: None (Preliminary result)   Collection Time: 06/12/18  7:24 PM  Result Value Ref Range Status   Specimen Description   Final    BLOOD LEFT ARM Performed at [facility] [address] [place], [place] [address]    Special Requests   Final    BOTTLES DRAWN AEROBIC ONLY Blood Culture adequate volume Performed at [facility] [address] [place], [place] [address]    Culture   Final    NO GROWTH 2 DAYS Performed at [facility] [address] [place], [place] [address]    Report Status PENDING  Incomplete     Labs: Basic Metabolic Panel: Recent Labs  Lab 06/11/18 1112 06/12/18 0543 06/12/18 1638 06/13/18 0559 06/14/18 0603  NA 140 143 139 143 140  K 4.2 4.1 3.9 3.6 3.6  CL 108 114* 112* 118* 111  CO2 21* 14* 21* 20* 23  GLUCOSE 257* 341* 234* 72 145*  BUN 36* 44* 50* 32* 12  CREATININE 1.07* 1.87* 1.94* 1.40* 0.91  CALCIUM 9.1 8.6* 8.3* 8.5* 8.6*  MG  --   --   --   --  1.9   Liver Function Tests: Recent Labs  Lab 06/11/18 1112  AST 23  ALT 33  ALKPHOS  123  BILITOT 1.2  PROT 7.3  ALBUMIN 3.5   Recent Labs  Lab 06/11/18 1112  LIPASE 23   No results for input(s): AMMONIA in the last 168 hours. CBC: Recent Labs  Lab 06/11/18 1112 06/12/18 0543 06/13/18 0559 06/14/18 0603  WBC 16.3* 20.6* 13.5* 12.2*  NEUTROABS 14.5*  --   --  9.6*  HGB 14.2 12.9 11.2* 12.0  HCT 43.1 40.8 34.9* 36.1  MCV 92.7 96.5 95.9 92.8  PLT 262 286 256 208   Cardiac Enzymes: Recent Labs  Lab 06/11/18 1450 06/12/18 0551  TROPONINI 0.03* 0.03*   BNP: BNP (last 3 results) No results for input(s): BNP in the last 8760 hours.  ProBNP (last 3 results) No results for input(s): PROBNP in the last 8760 hours.  CBG: Recent Labs  Lab 06/14/18 0028 06/14/18 0205 06/14/18 0657 06/14/18 0740 06/14/18 1232  GLUCAP 98 166* 137* 122* 103*       Signed:  Florencia Reasons MD, PhD  [facility] 06/14/2018, 8:30 PM

## 2018-06-14 NOTE — Progress Notes (Signed)
Hypoglycemic Event  CBG: 62  Treatment: 236 ml milk and graham crackers  Symptoms: None  Follow-up CBG: Time: 0028 CBG Result:98  Possible Reasons for Event: Insufficient intake  Comments/MD notified: Jeoffrey Massed, Carlen Rebuck N

## 2018-06-14 NOTE — Progress Notes (Addendum)
Hypoglycemic Event  CBG: 41  Treatment: 4 oz apple juice  Symptoms: None  Follow-up CBG: Time: 0003 CBG Result: 62  Possible Reasons for Event:  Insufficient intake  Comments/MD notified: Jeoffrey Massed, Kynsie Falkner N

## 2018-06-14 NOTE — Progress Notes (Signed)
PHARMACIST - PHYSICIAN COMMUNICATION  DR:  Erlinda Hong  CONCERNING: IV to Oral Route Change Policy  RECOMMENDATION: This patient is receiving Pepcid by the intravenous route.  Based on criteria approved by the Pharmacy and Therapeutics Committee, the intravenous medication(s) is/are being converted to the equivalent oral dose form(s).   DESCRIPTION: These criteria include:  The patient is eating (either orally or via tube) and/or has been taking other orally administered medications for a least 24 hours  The patient has no evidence of active gastrointestinal bleeding or impaired GI absorption (gastrectomy, short bowel, patient on TNA or NPO).  If you have questions about this conversion, please contact the Pharmacy Department  []   [phone] )  [facility] []   [phone] )  [facility] []   [phone] )  [facility] []   [phone] )  [facility] [x]   [phone] )  [place], [place], [facility] 06/14/2018 8:02 AM

## 2018-06-14 NOTE — Care Management Note (Signed)
Case Management Note  Patient Details  Name: Tina [name] MRN: [id number] Date of Birth: 07-Mar-1966  Subjective/Objective: [facility] chosen for [facility] aware of [place] orders-HHRN/PT, & d/c today. No further CM needs.                   Action/Plan:dc home w/HHC.   Expected Discharge Date:  06/14/18               Expected Discharge Plan:  [place]  In-House Referral:     Discharge planning Services  CM Consult  Post Acute Care Choice:  Durable Medical Equipment(Has rw) Choice offered to:  Patient  DME Arranged:    DME Agency:     HH Arranged:  RN, PT [place] Agency:  [facility]  Status of Service:  Completed, signed off  If discussed at [place] of Stay Meetings, dates discussed:    Additional Comments:  Dessa Phi, RN 06/14/2018, 11:02 AM

## 2018-06-14 NOTE — Care Management Important Message (Signed)
Important Message  Patient Details  Name: Tina [name] MRN: [id number] Date of Birth: 1966/05/09   Medicare Important Message Given:  Yes    Kerin Salen 06/14/2018, 10:50 AMImportant Message  Patient Details  Name: Tina [name] MRN: [id number] Date of Birth: Apr 24, 1966   Medicare Important Message Given:  Yes    Kerin Salen 06/14/2018, 10:50 AM

## 2018-06-14 NOTE — Progress Notes (Signed)
Dr. Sallyanne Kuster signed off yesterday, indicated plan for pt to f/u as OP. I was not here yesterday so not sure that this was requested so I sent message to schedulers to arrange and call pt with appt. Westin Knotts PA-C

## 2018-06-14 NOTE — Progress Notes (Signed)
Inpatient Diabetes Program Recommendations  AACE/ADA: New Consensus Statement on Inpatient Glycemic Control (2015)  Target Ranges:  Prepandial:   less than 140 mg/dL      Peak postprandial:   less than 180 mg/dL (1-2 hours)      Critically ill patients:  140 - 180 mg/dL   Lab Results  Component Value Date   GLUCAP 122 (H) 06/14/2018   HGBA1C 5.7 (H) 06/13/2018    Review of Glycemic ControlResults for Tina [name], Tina [name] (MRN [id number]) as of 06/14/2018 08:56  Ref. Range 06/13/2018 11:55 06/13/2018 17:10 06/13/2018 20:47 06/13/2018 23:46 06/14/2018 00:03 06/14/2018 00:28 06/14/2018 02:05 06/14/2018 06:57 06/14/2018 07:40  Glucose-Capillary Latest Ref Range: 70 - 99 mg/dL 197 (H) 106 (H) 76 41 (LL) 62 (L) 98 166 (H) 137 (H) 122 (H)    Diabetes history: Type 1 DM  Outpatient Diabetes medications: Insulin pump- settings in note from 06/12/18 Current orders for Inpatient glycemic control:  Insulin pump restarted on 11/27 in the PM  Inpatient Diabetes Program Recommendations:    Called and spoke with patient by phone.  She is still upset about insulin pump being removed on 11/26 and blood sugar being high.  I asked about her low blood sugars and she states that this is not unusual for her.  We discussed the importance of preventing low blood sugars.  Patient has already bolused this morning for breakfast, however her tray is not there yet?? Encouraged patient to get snack to prevent low blood sugar while she is waiting for tray.  She states "I've had diabetes for over 27 years and know how to handle myself".  Again stressed the importance of avoiding low blood sugars due to safety.  A1C is 5.7% indicating that average blood sugars at home are approximately 116 mg/dL.  Recommend f/u with endocrinologist for possible pump adjustments due to low blood sugars.   Thanks,  Adah Perl, RN, BC-ADM Inpatient Diabetes Coordinator Pager [phone] (8a-5p)

## 2018-06-14 NOTE — Progress Notes (Signed)
Pt to be discharged to home this afternoon. Discharge teaching including review of all Medications and schedules discussed with Pt.  Pt and Husband verbalized understanding of all discharge teaching. VSS No acute changes noted with Pt's assessment at time of discharge. Discharge packet with Pt's husband at time of discharge

## 2018-06-16 ENCOUNTER — Telehealth: Payer: Self-pay | Admitting: Internal Medicine

## 2018-06-16 NOTE — Telephone Encounter (Signed)
Cardiology Moon[place] Note  Returned page from patient's husband. She was discharged from hospital 2 days ago. Wasn't able to fill all of her medications due to holidays and some financial issues. Currently patient BP is 165/85. Not taking diltiazem currently. Is taking losartan 50mg  daily. Last dose with this AM. Patient not supposed to be taking HCTZ, though husband decided to continue giving patient HCTZ because he never filled the diltiazem.  Patient will take another dose of 50mg  losartan tonight. Patient is asymptomatic at this time. He plans to pick up medications on 12/3 and then resume losartan at the previously prescribed 50mg  daily. Also recommended that patient discontinue HCTZ as previously instructed.   Marcie Mowers, MD Cardiology Fellow, PGY-6

## 2018-06-17 ENCOUNTER — Telehealth: Payer: Self-pay | Admitting: Cardiovascular Disease

## 2018-06-17 ENCOUNTER — Other Ambulatory Visit: Payer: Self-pay

## 2018-06-17 DIAGNOSIS — I10 Essential (primary) hypertension: Secondary | ICD-10-CM | POA: Diagnosis not present

## 2018-06-17 DIAGNOSIS — N182 Chronic kidney disease, stage 2 (mild): Secondary | ICD-10-CM | POA: Diagnosis not present

## 2018-06-17 DIAGNOSIS — Q969 Turner's syndrome, unspecified: Secondary | ICD-10-CM | POA: Diagnosis not present

## 2018-06-17 DIAGNOSIS — E1022 Type 1 diabetes mellitus with diabetic chronic kidney disease: Secondary | ICD-10-CM | POA: Diagnosis not present

## 2018-06-17 DIAGNOSIS — M1990 Unspecified osteoarthritis, unspecified site: Secondary | ICD-10-CM | POA: Diagnosis not present

## 2018-06-17 DIAGNOSIS — H547 Unspecified visual loss: Secondary | ICD-10-CM | POA: Diagnosis not present

## 2018-06-17 DIAGNOSIS — I129 Hypertensive chronic kidney disease with stage 1 through stage 4 chronic kidney disease, or unspecified chronic kidney disease: Secondary | ICD-10-CM | POA: Diagnosis not present

## 2018-06-17 DIAGNOSIS — Z09 Encounter for follow-up examination after completed treatment for conditions other than malignant neoplasm: Secondary | ICD-10-CM | POA: Diagnosis not present

## 2018-06-17 DIAGNOSIS — E103553 Type 1 diabetes mellitus with stable proliferative diabetic retinopathy, bilateral: Secondary | ICD-10-CM | POA: Diagnosis not present

## 2018-06-17 DIAGNOSIS — I480 Paroxysmal atrial fibrillation: Secondary | ICD-10-CM | POA: Diagnosis not present

## 2018-06-17 DIAGNOSIS — E669 Obesity, unspecified: Secondary | ICD-10-CM | POA: Diagnosis not present

## 2018-06-17 DIAGNOSIS — E785 Hyperlipidemia, unspecified: Secondary | ICD-10-CM | POA: Diagnosis not present

## 2018-06-17 DIAGNOSIS — M810 Age-related osteoporosis without current pathological fracture: Secondary | ICD-10-CM | POA: Diagnosis not present

## 2018-06-17 DIAGNOSIS — Z7901 Long term (current) use of anticoagulants: Secondary | ICD-10-CM | POA: Diagnosis not present

## 2018-06-17 LAB — CULTURE, BLOOD (ROUTINE X 2)
Culture: NO GROWTH
Culture: NO GROWTH
SPECIAL REQUESTS: ADEQUATE
Special Requests: ADEQUATE

## 2018-06-17 NOTE — Telephone Encounter (Signed)
Pt seen in ER wants to discuss med changes, new meds added and BP med increased . pls call

## 2018-06-17 NOTE — Patient Outreach (Signed)
[facility]) Care Management  06/17/2018  Berlinda Valenti-Cohen 24-Jan-1966 [id number]   EMMI- General Discharge RED ON EMMI ALERT Day # 1 Date: 06/16/18 Red Alert Reason:   Scheduled Follow up? No  Outreach attempt: spoke with patient.  She states she is doing ok since being home.  Addressed red alert with patient.  She states that she has made her follow up appointments but did not give dates.  Patient declines any other needs/questions/concerns and ended call.   Plan: RN CM will close case.    Jone Baseman, RN, MSN [facility] Care Management Care Management Coordinator Direct Line [phone] Toll Free: [phone]  Fax: [phone]

## 2018-06-17 NOTE — Telephone Encounter (Signed)
I spoke with pt. She would like to see Dr. Angelena Form as soon as possible to discuss changes made in ED. I offered appointment this afternoon but she has another appointment and cannot be here this afternoon.  I scheduled her to see Dr. Angelena Form on 06/19/18 at 3:20 Pt has started taking Coumadin and per phone note needs an INR check on 12/3.  I told pt I would send message to [facility] clinic and they would contact her regarding appointment.

## 2018-06-17 NOTE — Telephone Encounter (Signed)
Reviewed.   Lauree Chandler

## 2018-06-17 NOTE — Telephone Encounter (Signed)
Spoke with patient made new Coumadin appt for 06/19/18 before visit with Dr. Angelena Form - she is aware. She inquired about 30-day free card. I was unable to answer questions as I am not sure why she would have been unable to use since she has never been on Eliquis in the past.

## 2018-06-19 ENCOUNTER — Telehealth: Payer: Self-pay

## 2018-06-19 ENCOUNTER — Ambulatory Visit (INDEPENDENT_AMBULATORY_CARE_PROVIDER_SITE_OTHER): Payer: Medicare HMO | Admitting: Pharmacist

## 2018-06-19 ENCOUNTER — Encounter: Payer: Self-pay | Admitting: Cardiovascular Disease

## 2018-06-19 ENCOUNTER — Ambulatory Visit (INDEPENDENT_AMBULATORY_CARE_PROVIDER_SITE_OTHER): Payer: Medicare HMO | Admitting: Cardiovascular Disease

## 2018-06-19 VITALS — BP 187/100 | HR 89 | Ht <= 58 in | Wt 151.8 lb

## 2018-06-19 DIAGNOSIS — H547 Unspecified visual loss: Secondary | ICD-10-CM | POA: Diagnosis not present

## 2018-06-19 DIAGNOSIS — Z7901 Long term (current) use of anticoagulants: Secondary | ICD-10-CM | POA: Diagnosis not present

## 2018-06-19 DIAGNOSIS — M810 Age-related osteoporosis without current pathological fracture: Secondary | ICD-10-CM | POA: Diagnosis not present

## 2018-06-19 DIAGNOSIS — I4891 Unspecified atrial fibrillation: Secondary | ICD-10-CM | POA: Insufficient documentation

## 2018-06-19 DIAGNOSIS — N182 Chronic kidney disease, stage 2 (mild): Secondary | ICD-10-CM | POA: Diagnosis not present

## 2018-06-19 DIAGNOSIS — I10 Essential (primary) hypertension: Secondary | ICD-10-CM

## 2018-06-19 DIAGNOSIS — I129 Hypertensive chronic kidney disease with stage 1 through stage 4 chronic kidney disease, or unspecified chronic kidney disease: Secondary | ICD-10-CM | POA: Diagnosis not present

## 2018-06-19 DIAGNOSIS — Z5181 Encounter for therapeutic drug level monitoring: Secondary | ICD-10-CM | POA: Insufficient documentation

## 2018-06-19 DIAGNOSIS — M1990 Unspecified osteoarthritis, unspecified site: Secondary | ICD-10-CM | POA: Diagnosis not present

## 2018-06-19 DIAGNOSIS — I480 Paroxysmal atrial fibrillation: Secondary | ICD-10-CM

## 2018-06-19 DIAGNOSIS — E1022 Type 1 diabetes mellitus with diabetic chronic kidney disease: Secondary | ICD-10-CM | POA: Diagnosis not present

## 2018-06-19 DIAGNOSIS — Q969 Turner's syndrome, unspecified: Secondary | ICD-10-CM | POA: Diagnosis not present

## 2018-06-19 LAB — POCT INR: INR: 1.1 — AB (ref 2.0–3.0)

## 2018-06-19 MED ORDER — DILTIAZEM HCL ER COATED BEADS 240 MG PO CP24: 240.0000 mg | ORAL_CAPSULE | Freq: Every day | ORAL | 3 refills | Status: AC

## 2018-06-19 MED ORDER — LOSARTAN POTASSIUM 100 MG PO TABS: 50.0000 mg | ORAL_TABLET | Freq: Two times a day (BID) | ORAL | 3 refills | Status: DC

## 2018-06-19 NOTE — Patient Instructions (Addendum)
Medication Instructions:   Your physician has recommended you make the following change in your medication:Increase Cardizem CD to 240 mg by mouth daily.  Take Cozaar 50 mg by mouth daily.  May take additional 50 mg in evening if BP elevated.  If consistently elevated  please let us know so medication can be adjusted.    If you need a refill on your cardiac medications before your next appointment, please call your pharmacy.   Lab work: none If you have labs (blood work) drawn today and your tests are completely normal, you will receive your results only by: Marland Kitchen MyChart Message (if you have MyChart) OR . A paper copy in the mail If you have any lab test that is abnormal or we need to change your treatment, we will call you to review the results.  Testing/Procedures: none  Follow-Up: At [facility], you and your health needs are our priority.  As part of our continuing mission to provide you with exceptional heart care, we have created designated Provider Care Teams.  These Care Teams include your primary Cardiologist (physician) and Advanced Practice Providers (APPs -  Physician Assistants and Nurse Practitioners) who all work together to provide you with the care you need, when you need it. You will need a follow up appointment in 4 months.  Please call our office 2 months in advance to schedule this appointment.  You may see Lauree Chandler, MD or one of the following Advanced Practice Providers on your designated Care Team:   [place], PA-C Melina Copa, PA-C . Ermalinda Barrios, PA-C  Any Other Special Instructions Will Be Listed Below (If Applicable).

## 2018-06-19 NOTE — Progress Notes (Signed)
Chief Complaint  Patient presents with  . Follow-up    PAF    History of Present Illness: 52 yo female with history of DM, HTN, hyperlipidemia, Turner's syndrome and recent diagnosis of atrial fibrillation who is here today for cardiac follow up. I saw her in 2012 as a new patient for evaluation of SOB and palpitations. She reported tachycardia. She described palpitations when anxious. I arranged a 48 Hour Holter monitor and an echo. Her Holter monitor showed PACs. There was a lot of artifact on the monitor. No SVT. Her echo was normal. She then had /o chest pain in 2017. Nuclear stress test December 2017 with no ischemia. She was admitted to [facility] 06/11/18 with c/o nausea and weakness and was found to have atrial fibrillation with RVR. She was started on IV Cardizem and IV heparin and converted to sinus. Echo 06/12/18 with LVEF=60-65%. No significant valve disease. CHADS VASC score 3. She was started on Eliquis but could not afford so changed to coumadin. Her 30 day free card did not work.   She is here today for follow up. The patient denies any chest pain, dyspnea, palpitations, lower extremity edema, orthopnea, PND, dizziness, near syncope or syncope. Overall feeling much better since discharge. Nausea has resolved. She has not been using Reglan as advised. BP has been elevated at home. She has not yet increased the Cardizem to 240 mg daily as advised upon discharge. She has been taking Cozaar 50 mg in the am and only taking the pm dose if her BP is over 165 systolic in the evening.    Primary Care Physician:  Vernie Shanks, MD  Past Medical History:  Diagnosis Date  . Arthritis   . Cataract   . Diabetic retinopathy ([place])   . DM (diabetes mellitus), type 1 ([place])   . Dyspnea   . Elevated LFTs   . Hyperlipidemia   . Hypertension   . Legally blind in right eye, as defined in [place]   . Osteoporosis 11/2017   T score -3.8  . Retinal detachment   . Tachycardia   . Turner's syndrome   .  Visual loss, bilateral    right eye light perception only and left eye 20/100    Past Surgical History:  Procedure Laterality Date  . CATARACT EXTRACTION    . CHOLECYSTECTOMY    . Hepatic adenoma removed    . RETINAL DETACHMENT SURGERY    . right eye surgery      3 years ago  . TONSILLECTOMY      Current Outpatient Medications  Medication Sig Dispense Refill  . Continuous Blood Gluc Receiver (FREESTYLE LIBRE READER) DEVI by Does not apply route.    . Continuous Blood Gluc Sensor ([place]) MISC by Does not apply route.    Marland Kitchen glucose blood (ONE TOUCH ULTRA TEST) test strip USE TO TEST 6 TO 8 TIMES DAILY AS NEEDED    . glucose blood test strip USE TO TEST 6 TO 8 TIMES DAILY AS NEEDED    . Insulin Human (INSULIN PUMP) SOLN Inject 1 each into the skin continuous. Novolog 100 units/ml - basal rate 33 units/day and averaging 55 units/day with meals    . magnesium oxide (MAG-OX) 400 MG tablet Take 400 mg by mouth daily.    . methocarbamol (ROBAXIN) 500 MG tablet Take 500 mg by mouth 4 (four) times daily as needed for muscle spasms.   0  . rosuvastatin (CRESTOR) 20 MG tablet Take  20 mg by mouth daily.    Marland Kitchen triamcinolone cream (KENALOG) 0.1 % Apply 1 application topically 2 (two) times daily as needed (ezcema). Arms and hands  1  . warfarin (COUMADIN) 5 MG tablet Take 1 tablet (5 mg total) by mouth daily. 30 tablet 3  . diltiazem (CARDIZEM CD) 240 MG 24 hr capsule Take 1 capsule (240 mg total) by mouth daily. 90 capsule 3  . losartan (COZAAR) 100 MG tablet Take 0.5 tablets (50 mg total) by mouth 2 (two) times daily. 90 tablet 3  . metoCLOPramide (REGLAN) 10 MG tablet Take 1 tablet (10 mg total) by mouth every 8 (eight) hours as needed for nausea. (Patient not taking: Reported on 06/19/2018) 30 tablet 0  . Vitamin D, Ergocalciferol, (DRISDOL) 50000 units CAPS capsule Take 1 capsule (50,000 Units total) by mouth every 7 (seven) days. (Patient not taking: Reported on 06/19/2018)  12 capsule 0   No current facility-administered medications for this visit.     Allergies  Allergen Reactions  . Acetazolamide Anaphylaxis and Other (See Comments)     Reaction, drop in Blood pressure that caused patient to stay in bed for 5 days, inconherient  Reaction, drop in Blood pressure that caused patient to stay in bed for 5 days, inconherient  . Ace Inhibitors Cough    Social History   Socioeconomic History  . Marital status: Married    Spouse name: Not on file  . Number of children: Not on file  . Years of education: Not on file  . Highest education level: Not on file  Occupational History  . Occupation: Disabled  Social Needs  . Financial resource strain: Not on file  . Food insecurity:    Worry: Not on file    Inability: Not on file  . Transportation needs:    Medical: Not on file    Non-medical: Not on file  Tobacco Use  . Smoking status: Former Smoker    Packs/day: 0.20    Years: 0.50    Pack years: 0.10    Types: Cigarettes    Last attempt to quit: 07/17/1988    Years since quitting: 29.9  . Smokeless tobacco: Never Used  . Tobacco comment: many years ago  Substance and Sexual Activity  . Alcohol use: Yes    Comment: social  . Drug use: No  . Sexual activity: Yes    Partners: Male    Comment: 1ST INTERCOURSE- 21, partners- 5, married- 24 yrs   Lifestyle  . Physical activity:    Days per week: Not on file    Minutes per session: Not on file  . Stress: Not on file  Relationships  . Social connections:    Talks on phone: Not on file    Gets together: Not on file    Attends religious service: Not on file    Active member of club or organization: Not on file    Attends meetings of clubs or organizations: Not on file    Relationship status: Not on file  . Intimate partner violence:    Fear of current or ex partner: Not on file    Emotionally abused: Not on file    Physically abused: Not on file    Forced sexual activity: Not on file  Other  Topics Concern  . Not on file  Social History Narrative   Married, disabled due to visual problems. She is legally blind.   No children.   No caffeine reported.    Family  History  Problem Relation Age of Onset  . Cancer Father        kidney  . Diabetes Maternal Grandmother   . Emphysema Paternal Grandfather   . Esophageal cancer Paternal Aunt   . Colon cancer Neg Hx   . Colon polyps Neg Hx   . Rectal cancer Neg Hx   . Stomach cancer Neg Hx     Review of Systems:  As stated in the HPI and otherwise negative.   BP (!) 187/100   Pulse 89   Ht 4\' 9"  (1.448 m)   Wt 151 lb 12.8 oz (68.9 kg)   SpO2 97%   BMI 32.85 kg/m   Physical Examination:  General: Well developed, well nourished, NAD  HEENT: OP clear, mucus membranes moist  SKIN: warm, dry. No rashes. Neuro: No focal deficits  Musculoskeletal: Muscle strength 5/5 all ext  Psychiatric: Mood and affect normal  Neck: No JVD, no carotid bruits, no thyromegaly, no lymphadenopathy.  Lungs:Clear bilaterally, no wheezes, rhonci, crackles Cardiovascular: Regular rate and rhythm. No murmurs, gallops or rubs. Abdomen:Soft. Bowel sounds present. Non-tender.  Extremities: No lower extremity edema. Pulses are 2 + in the bilateral DP/PT.  Echo 06/12/18: - Left ventricle: The cavity size was normal. Systolic function was   normal. The estimated ejection fraction was in the range of 60%   to 65%. Wall motion was normal; there were no regional wall   motion abnormalities. Abnormal relaxation with increased filling   pressures. - Aortic valve: There was no regurgitation. - Aortic root: The aortic root was normal in size. - Mitral valve: There was trivial regurgitation. - Right ventricle: Systolic function was normal. - Atrial septum: No defect or patent foramen ovale was identified. - Tricuspid valve: There was no significant regurgitation. - Inferior vena cava: The vessel was normal in size. The   respirophasic diameter changes  were in the normal range (>= 50%),   consistent with normal central venous pressure. - Pericardium, extracardiac: A trivial pericardial effusion was   identified.  EKG:  EKG is ordered today. The ekg ordered today demonstrates NSR, rate 89 bpm. Non-specific ST abnormality.   Recent Labs: 06/11/2018: ALT 33 06/13/2018: TSH 1.567 06/14/2018: BUN 12; Creatinine, Ser 0.91; Hemoglobin 12.0; Magnesium 1.9; Platelets 208; Potassium 3.6; Sodium 140   Lipid Panel    Component Value Date/Time   CHOL 273 (H) 10/06/2009 2131   TRIG 87 10/06/2009 2131   HDL 93 10/06/2009 2131   CHOLHDL 2.9 Ratio 10/06/2009 2131   VLDL 17 10/06/2009 2131   LDLCALC 163 (H) 10/06/2009 2131     Wt Readings from Last 3 Encounters:  06/19/18 151 lb 12.8 oz (68.9 kg)  06/11/18 149 lb 7.6 oz (67.8 kg)  12/18/17 151 lb (68.5 kg)     Other studies Reviewed: Additional studies/ records that were reviewed today include: . Review of the above records demonstrates:   Assessment and Plan:   1. Paroxysmal Atrial fibrillation/PACs: Sinus today on Cardizem. Question if her atrial fibrillation in November 2019 was caused by her acute GI illness. She has a long standing history of PACs but no prior documented atrial fibrillation. CHADS VASC score 3. Will continue coumadin for now. She cannot afford the 45 dollar copay for Eliquis. We will ask our coumadin clinic to explore options for Eliquis assistance if the patient would like to change to Eliquis in the future.    2. HTN: BP is elevated today. Will increase Cardizem to 240 mg daily. She will  continue to use Cozaar 50 mg daily in the am. If BP remains above 140/90 after the increase in Cardizem, will ask her to increase Cozaar to 100 mg daily.   Current medicines are reviewed at length with the patient today.  The patient does not have concerns regarding medicines.  The following changes have been made:  no change  Labs/ tests ordered today include:   Orders Placed  This Encounter  Procedures  . EKG 12-Lead    Disposition:   FU with me in 4  months  Signed, Lauree Chandler, MD 06/19/2018 3:40 PM    [facility] [place], [place], [place]  [address] Phone: [phone]; Fax: [phone]

## 2018-06-19 NOTE — Telephone Encounter (Signed)
Patient no longer wants to receive any EMMI related calls.

## 2018-06-19 NOTE — Patient Instructions (Addendum)
Description   Start taking 1.5 tablets each day. Recheck INR in 1 week. Coumadin Clinic [phone] Main [phone]    A full discussion of the nature of anticoagulants has been carried out.  A benefit risk analysis has been presented to the patient, so that they understand the justification for choosing anticoagulation at this time. The need for frequent and regular monitoring, precise dosage adjustment and compliance is stressed.  Side effects of potential bleeding are discussed.  The patient should avoid any OTC items containing aspirin or ibuprofen, and should avoid great swings in general diet.  Avoid alcohol consumption.  Call if any signs of abnormal bleeding.

## 2018-06-25 DIAGNOSIS — I10 Essential (primary) hypertension: Secondary | ICD-10-CM

## 2018-06-26 DIAGNOSIS — E1022 Type 1 diabetes mellitus with diabetic chronic kidney disease: Secondary | ICD-10-CM | POA: Diagnosis not present

## 2018-06-26 DIAGNOSIS — M810 Age-related osteoporosis without current pathological fracture: Secondary | ICD-10-CM | POA: Diagnosis not present

## 2018-06-26 DIAGNOSIS — I129 Hypertensive chronic kidney disease with stage 1 through stage 4 chronic kidney disease, or unspecified chronic kidney disease: Secondary | ICD-10-CM | POA: Diagnosis not present

## 2018-06-26 DIAGNOSIS — H547 Unspecified visual loss: Secondary | ICD-10-CM | POA: Diagnosis not present

## 2018-06-26 DIAGNOSIS — Q969 Turner's syndrome, unspecified: Secondary | ICD-10-CM | POA: Diagnosis not present

## 2018-06-26 DIAGNOSIS — M1990 Unspecified osteoarthritis, unspecified site: Secondary | ICD-10-CM | POA: Diagnosis not present

## 2018-06-26 DIAGNOSIS — Z7901 Long term (current) use of anticoagulants: Secondary | ICD-10-CM | POA: Diagnosis not present

## 2018-06-26 DIAGNOSIS — I480 Paroxysmal atrial fibrillation: Secondary | ICD-10-CM | POA: Diagnosis not present

## 2018-06-26 DIAGNOSIS — N182 Chronic kidney disease, stage 2 (mild): Secondary | ICD-10-CM | POA: Diagnosis not present

## 2018-06-26 MED ORDER — LOSARTAN POTASSIUM 100 MG PO TABS: 100.0000 mg | ORAL_TABLET | Freq: Every day | ORAL | 3 refills | Status: DC

## 2018-06-26 NOTE — Telephone Encounter (Signed)
I spoke with pt and scheduled BMP for 07/04/18

## 2018-06-27 ENCOUNTER — Ambulatory Visit (INDEPENDENT_AMBULATORY_CARE_PROVIDER_SITE_OTHER): Payer: Medicare HMO | Admitting: *Deleted

## 2018-06-27 DIAGNOSIS — Z5181 Encounter for therapeutic drug level monitoring: Secondary | ICD-10-CM | POA: Diagnosis not present

## 2018-06-27 DIAGNOSIS — I4891 Unspecified atrial fibrillation: Secondary | ICD-10-CM

## 2018-06-27 LAB — POCT INR: INR: 1.9 — AB (ref 2.0–3.0)

## 2018-06-27 NOTE — Patient Instructions (Signed)
Description   Today take 2 tablets then start taking 1.5 tablets each day except 2 tablets on Mondays. Recheck INR in 1 week. Coumadin Clinic [phone] Main [phone]

## 2018-06-29 DIAGNOSIS — N182 Chronic kidney disease, stage 2 (mild): Secondary | ICD-10-CM | POA: Diagnosis not present

## 2018-06-29 DIAGNOSIS — H547 Unspecified visual loss: Secondary | ICD-10-CM | POA: Diagnosis not present

## 2018-06-29 DIAGNOSIS — Z7901 Long term (current) use of anticoagulants: Secondary | ICD-10-CM | POA: Diagnosis not present

## 2018-06-29 DIAGNOSIS — E1022 Type 1 diabetes mellitus with diabetic chronic kidney disease: Secondary | ICD-10-CM | POA: Diagnosis not present

## 2018-06-29 DIAGNOSIS — Q969 Turner's syndrome, unspecified: Secondary | ICD-10-CM | POA: Diagnosis not present

## 2018-06-29 DIAGNOSIS — M810 Age-related osteoporosis without current pathological fracture: Secondary | ICD-10-CM | POA: Diagnosis not present

## 2018-06-29 DIAGNOSIS — I129 Hypertensive chronic kidney disease with stage 1 through stage 4 chronic kidney disease, or unspecified chronic kidney disease: Secondary | ICD-10-CM | POA: Diagnosis not present

## 2018-06-29 DIAGNOSIS — M1990 Unspecified osteoarthritis, unspecified site: Secondary | ICD-10-CM | POA: Diagnosis not present

## 2018-06-29 DIAGNOSIS — I480 Paroxysmal atrial fibrillation: Secondary | ICD-10-CM | POA: Diagnosis not present

## 2018-07-02 DIAGNOSIS — Q969 Turner's syndrome, unspecified: Secondary | ICD-10-CM | POA: Diagnosis not present

## 2018-07-02 DIAGNOSIS — I129 Hypertensive chronic kidney disease with stage 1 through stage 4 chronic kidney disease, or unspecified chronic kidney disease: Secondary | ICD-10-CM | POA: Diagnosis not present

## 2018-07-02 DIAGNOSIS — M1990 Unspecified osteoarthritis, unspecified site: Secondary | ICD-10-CM | POA: Diagnosis not present

## 2018-07-02 DIAGNOSIS — I480 Paroxysmal atrial fibrillation: Secondary | ICD-10-CM | POA: Diagnosis not present

## 2018-07-02 DIAGNOSIS — E1022 Type 1 diabetes mellitus with diabetic chronic kidney disease: Secondary | ICD-10-CM | POA: Diagnosis not present

## 2018-07-02 DIAGNOSIS — Z7901 Long term (current) use of anticoagulants: Secondary | ICD-10-CM | POA: Diagnosis not present

## 2018-07-02 DIAGNOSIS — N182 Chronic kidney disease, stage 2 (mild): Secondary | ICD-10-CM | POA: Diagnosis not present

## 2018-07-02 DIAGNOSIS — H547 Unspecified visual loss: Secondary | ICD-10-CM | POA: Diagnosis not present

## 2018-07-02 DIAGNOSIS — M810 Age-related osteoporosis without current pathological fracture: Secondary | ICD-10-CM | POA: Diagnosis not present

## 2018-07-03 DIAGNOSIS — Q969 Turner's syndrome, unspecified: Secondary | ICD-10-CM | POA: Diagnosis not present

## 2018-07-03 DIAGNOSIS — M810 Age-related osteoporosis without current pathological fracture: Secondary | ICD-10-CM | POA: Diagnosis not present

## 2018-07-03 DIAGNOSIS — H547 Unspecified visual loss: Secondary | ICD-10-CM | POA: Diagnosis not present

## 2018-07-03 DIAGNOSIS — E1022 Type 1 diabetes mellitus with diabetic chronic kidney disease: Secondary | ICD-10-CM | POA: Diagnosis not present

## 2018-07-03 DIAGNOSIS — M1990 Unspecified osteoarthritis, unspecified site: Secondary | ICD-10-CM | POA: Diagnosis not present

## 2018-07-03 DIAGNOSIS — Z7901 Long term (current) use of anticoagulants: Secondary | ICD-10-CM | POA: Diagnosis not present

## 2018-07-03 DIAGNOSIS — I480 Paroxysmal atrial fibrillation: Secondary | ICD-10-CM | POA: Diagnosis not present

## 2018-07-03 DIAGNOSIS — N182 Chronic kidney disease, stage 2 (mild): Secondary | ICD-10-CM | POA: Diagnosis not present

## 2018-07-03 DIAGNOSIS — I129 Hypertensive chronic kidney disease with stage 1 through stage 4 chronic kidney disease, or unspecified chronic kidney disease: Secondary | ICD-10-CM | POA: Diagnosis not present

## 2018-07-04 ENCOUNTER — Ambulatory Visit (INDEPENDENT_AMBULATORY_CARE_PROVIDER_SITE_OTHER): Payer: Medicare HMO | Admitting: Pharmacist

## 2018-07-04 ENCOUNTER — Other Ambulatory Visit: Payer: Medicare HMO

## 2018-07-04 DIAGNOSIS — Z5181 Encounter for therapeutic drug level monitoring: Secondary | ICD-10-CM | POA: Diagnosis not present

## 2018-07-04 DIAGNOSIS — I10 Essential (primary) hypertension: Secondary | ICD-10-CM | POA: Diagnosis not present

## 2018-07-04 DIAGNOSIS — I4891 Unspecified atrial fibrillation: Secondary | ICD-10-CM | POA: Diagnosis not present

## 2018-07-04 LAB — POCT INR: INR: 3.1 — AB (ref 2.0–3.0)

## 2018-07-04 NOTE — Patient Instructions (Addendum)
Description   Today take 1 tablets then continue taking 1.5 tablets each day except 2 tablets on Mondays. Recheck INR in 1 week - Amy with [facility]. Coumadin Clinic [phone] Main [phone]

## 2018-07-05 ENCOUNTER — Telehealth: Payer: Self-pay | Admitting: *Deleted

## 2018-07-05 LAB — BASIC METABOLIC PANEL
BUN/Creatinine Ratio: 24 — ABNORMAL HIGH (ref 9–23)
BUN: 27 mg/dL — AB (ref 6–24)
CO2: 25 mmol/L (ref 20–29)
Calcium: 8.9 mg/dL (ref 8.7–10.2)
Chloride: 107 mmol/L — ABNORMAL HIGH (ref 96–106)
Creatinine, Ser: 1.12 mg/dL — ABNORMAL HIGH (ref 0.57–1.00)
GFR calc Af Amer: 65 mL/min/{1.73_m2} (ref >59)
GFR calc non Af Amer: 57 mL/min/{1.73_m2} — ABNORMAL LOW (ref >59)
Glucose: 82 mg/dL (ref 65–99)
Potassium: 4.5 mmol/L (ref 3.5–5.2)
Sodium: 144 mmol/L (ref 134–144)

## 2018-07-05 NOTE — Telephone Encounter (Signed)
Great.  Thanks

## 2018-07-05 NOTE — Telephone Encounter (Signed)
Pt notified of lab results.  She reports BP has been averaging 138-140/85-86.  Usually takes BP about 2 hours after taking medications.  When checked by home health nurse the other day BP was 110/72. Pt reports she is feeling fine. I asked pt to keep record of BP readings daily for next 7-10 days and call us with these readings.

## 2018-07-05 NOTE — Telephone Encounter (Signed)
-----   Message from [name], MD sent at 07/05/2018  6:07 AM EST ----- BMET is ok. Continue current meds. Tina [name]

## 2018-07-11 ENCOUNTER — Ambulatory Visit (INDEPENDENT_AMBULATORY_CARE_PROVIDER_SITE_OTHER): Payer: Medicare HMO | Admitting: Internal Medicine

## 2018-07-11 DIAGNOSIS — I4891 Unspecified atrial fibrillation: Secondary | ICD-10-CM

## 2018-07-11 DIAGNOSIS — Z5181 Encounter for therapeutic drug level monitoring: Secondary | ICD-10-CM | POA: Diagnosis not present

## 2018-07-11 DIAGNOSIS — E1022 Type 1 diabetes mellitus with diabetic chronic kidney disease: Secondary | ICD-10-CM | POA: Diagnosis not present

## 2018-07-11 DIAGNOSIS — I480 Paroxysmal atrial fibrillation: Secondary | ICD-10-CM | POA: Diagnosis not present

## 2018-07-11 DIAGNOSIS — H547 Unspecified visual loss: Secondary | ICD-10-CM | POA: Diagnosis not present

## 2018-07-11 DIAGNOSIS — I129 Hypertensive chronic kidney disease with stage 1 through stage 4 chronic kidney disease, or unspecified chronic kidney disease: Secondary | ICD-10-CM | POA: Diagnosis not present

## 2018-07-11 DIAGNOSIS — Z7901 Long term (current) use of anticoagulants: Secondary | ICD-10-CM | POA: Diagnosis not present

## 2018-07-11 DIAGNOSIS — M810 Age-related osteoporosis without current pathological fracture: Secondary | ICD-10-CM | POA: Diagnosis not present

## 2018-07-11 DIAGNOSIS — Q969 Turner's syndrome, unspecified: Secondary | ICD-10-CM | POA: Diagnosis not present

## 2018-07-11 DIAGNOSIS — N182 Chronic kidney disease, stage 2 (mild): Secondary | ICD-10-CM | POA: Diagnosis not present

## 2018-07-11 DIAGNOSIS — M1990 Unspecified osteoarthritis, unspecified site: Secondary | ICD-10-CM | POA: Diagnosis not present

## 2018-07-11 LAB — POCT INR: INR: 3.2 — AB (ref 2.0–3.0)

## 2018-07-18 ENCOUNTER — Ambulatory Visit (INDEPENDENT_AMBULATORY_CARE_PROVIDER_SITE_OTHER): Payer: Medicare HMO | Admitting: Internal Medicine

## 2018-07-18 DIAGNOSIS — Z5181 Encounter for therapeutic drug level monitoring: Secondary | ICD-10-CM

## 2018-07-18 DIAGNOSIS — I480 Paroxysmal atrial fibrillation: Secondary | ICD-10-CM | POA: Diagnosis not present

## 2018-07-18 DIAGNOSIS — H547 Unspecified visual loss: Secondary | ICD-10-CM | POA: Diagnosis not present

## 2018-07-18 DIAGNOSIS — N182 Chronic kidney disease, stage 2 (mild): Secondary | ICD-10-CM | POA: Diagnosis not present

## 2018-07-18 DIAGNOSIS — E1022 Type 1 diabetes mellitus with diabetic chronic kidney disease: Secondary | ICD-10-CM | POA: Diagnosis not present

## 2018-07-18 DIAGNOSIS — I129 Hypertensive chronic kidney disease with stage 1 through stage 4 chronic kidney disease, or unspecified chronic kidney disease: Secondary | ICD-10-CM | POA: Diagnosis not present

## 2018-07-18 DIAGNOSIS — M1990 Unspecified osteoarthritis, unspecified site: Secondary | ICD-10-CM | POA: Diagnosis not present

## 2018-07-18 DIAGNOSIS — I4891 Unspecified atrial fibrillation: Secondary | ICD-10-CM | POA: Diagnosis not present

## 2018-07-18 DIAGNOSIS — Q969 Turner's syndrome, unspecified: Secondary | ICD-10-CM | POA: Diagnosis not present

## 2018-07-18 DIAGNOSIS — Z7901 Long term (current) use of anticoagulants: Secondary | ICD-10-CM | POA: Diagnosis not present

## 2018-07-18 DIAGNOSIS — M810 Age-related osteoporosis without current pathological fracture: Secondary | ICD-10-CM | POA: Diagnosis not present

## 2018-07-18 LAB — POCT INR: INR: 2.4 (ref 2.0–3.0)

## 2018-07-19 ENCOUNTER — Telehealth: Payer: Self-pay | Admitting: Internal Medicine

## 2018-07-19 NOTE — Telephone Encounter (Signed)
I spoke with pt who reports BP is "leveling out."  Last reading by home health nurse was 138/76. Pt also has a home machine and has been checking.

## 2018-07-19 NOTE — Telephone Encounter (Signed)
Pt requested to speak to Dr. Hilarie Fredrickson about a research study.

## 2018-07-19 NOTE — Telephone Encounter (Signed)
Pt recently hospitalized in Nov 2019 for Afib with RVR She reports while they are being diagnosed with gastroparesis related to her diabetes.  Metoclopramide was prescribed.  She has been hesitant to take this.  Reasonable to determine formally if this is the case. She will hold metoclopramide for now unless severe nausea/vomiting.  Proceed with 4 hr GES, no reglan x 72 hours before test Office visit with me thereafter

## 2018-07-22 ENCOUNTER — Other Ambulatory Visit: Payer: Self-pay

## 2018-07-22 DIAGNOSIS — R109 Unspecified abdominal pain: Secondary | ICD-10-CM

## 2018-07-22 MED FILL — NovoLOG 100 UNIT/ML SOLN: 100 | 38 days supply | Qty: 40 | Fill #0

## 2018-07-22 NOTE — Telephone Encounter (Signed)
Pt scheduled for GES at [facility] 07/30/18@7 :30am, pt to arrive there at 7:15am. Pt needs to hold her reglan for 72 hours prior to the test and be NPO after midnight prior to test. Pt scheduled to see Dr. Hilarie Fredrickson 08/06/18@8 :45am. Pt notified of appts via mychart per her request.

## 2018-07-25 ENCOUNTER — Ambulatory Visit (INDEPENDENT_AMBULATORY_CARE_PROVIDER_SITE_OTHER): Payer: Medicare HMO | Admitting: Interventional Cardiology

## 2018-07-25 DIAGNOSIS — Z5181 Encounter for therapeutic drug level monitoring: Secondary | ICD-10-CM

## 2018-07-25 DIAGNOSIS — M810 Age-related osteoporosis without current pathological fracture: Secondary | ICD-10-CM | POA: Diagnosis not present

## 2018-07-25 DIAGNOSIS — I480 Paroxysmal atrial fibrillation: Secondary | ICD-10-CM | POA: Diagnosis not present

## 2018-07-25 DIAGNOSIS — Z7901 Long term (current) use of anticoagulants: Secondary | ICD-10-CM | POA: Diagnosis not present

## 2018-07-25 DIAGNOSIS — H547 Unspecified visual loss: Secondary | ICD-10-CM | POA: Diagnosis not present

## 2018-07-25 DIAGNOSIS — I4891 Unspecified atrial fibrillation: Secondary | ICD-10-CM

## 2018-07-25 DIAGNOSIS — I129 Hypertensive chronic kidney disease with stage 1 through stage 4 chronic kidney disease, or unspecified chronic kidney disease: Secondary | ICD-10-CM | POA: Diagnosis not present

## 2018-07-25 DIAGNOSIS — N182 Chronic kidney disease, stage 2 (mild): Secondary | ICD-10-CM | POA: Diagnosis not present

## 2018-07-25 DIAGNOSIS — Q969 Turner's syndrome, unspecified: Secondary | ICD-10-CM | POA: Diagnosis not present

## 2018-07-25 DIAGNOSIS — M1990 Unspecified osteoarthritis, unspecified site: Secondary | ICD-10-CM | POA: Diagnosis not present

## 2018-07-25 DIAGNOSIS — E1022 Type 1 diabetes mellitus with diabetic chronic kidney disease: Secondary | ICD-10-CM | POA: Diagnosis not present

## 2018-07-25 LAB — POCT INR: INR: 3 (ref 2.0–3.0)

## 2018-07-25 NOTE — Patient Instructions (Addendum)
Description   Spoke with Amy [facility] while in the home & instructed to take 5mg  today then continue taking 7.5mg  daily except 5mg  on Sundays.  Recheck INR in 1 week. Coumadin Clinic [phone] Main [phone]

## 2018-07-29 ENCOUNTER — Telehealth: Payer: Self-pay | Admitting: Internal Medicine

## 2018-07-29 NOTE — Telephone Encounter (Signed)
PT is sch tomorrow at the hospital has question regarding meds. JG

## 2018-07-29 NOTE — Telephone Encounter (Signed)
The pt had questions regarding medications she should take in the morning prior to her GES.  She was advised to be NPO after midnight. The pt has been advised of the information and verbalized understanding.

## 2018-07-29 NOTE — Telephone Encounter (Signed)
Left message on machine to call back  

## 2018-07-30 ENCOUNTER — Encounter (HOSPITAL_COMMUNITY): Payer: Self-pay | Admitting: Radiology

## 2018-07-30 ENCOUNTER — Ambulatory Visit (HOSPITAL_COMMUNITY)
Admission: RE | Admit: 2018-07-30 | Discharge: 2018-07-30 | Disposition: A | Discharge status: Home / Self Care | Payer: Medicare HMO | Source: Ambulatory Visit | Attending: Internal Medicine | Admitting: Internal Medicine

## 2018-07-30 DIAGNOSIS — R109 Unspecified abdominal pain: Secondary | ICD-10-CM | POA: Insufficient documentation

## 2018-07-30 DIAGNOSIS — R112 Nausea with vomiting, unspecified: Secondary | ICD-10-CM | POA: Diagnosis not present

## 2018-07-30 MED ORDER — TECHNETIUM TC 99M SULFUR COLLOID
2.2000 | Freq: Once | INTRAVENOUS | Status: AC | PRN
  Administered 2018-07-30: 2.2 via INTRAVENOUS

## 2018-07-31 ENCOUNTER — Telehealth: Payer: Self-pay | Admitting: Internal Medicine

## 2018-07-31 NOTE — Telephone Encounter (Signed)
Pt called and want the nurse to return her call she want to advise the nurse she will be doing a case study and will be using Relamorelin  And want to know the doctors intake on using this medication.

## 2018-08-01 ENCOUNTER — Encounter: Payer: Self-pay | Admitting: *Deleted

## 2018-08-01 ENCOUNTER — Ambulatory Visit (INDEPENDENT_AMBULATORY_CARE_PROVIDER_SITE_OTHER): Payer: Medicare HMO | Admitting: Pharmacist

## 2018-08-01 DIAGNOSIS — Z5181 Encounter for therapeutic drug level monitoring: Secondary | ICD-10-CM

## 2018-08-01 DIAGNOSIS — I4891 Unspecified atrial fibrillation: Secondary | ICD-10-CM

## 2018-08-01 LAB — POCT INR: INR: 2.6 (ref 2.0–3.0)

## 2018-08-01 NOTE — Telephone Encounter (Signed)
Pt called and wanted to let Dr. Hilarie Fredrickson know she the name of the drug she will receiving is Relamorelin Inj. She wanted to check with Dr. Hilarie Fredrickson to see if he feels it is ok for her to take. Please advise.

## 2018-08-01 NOTE — Telephone Encounter (Signed)
This is a new medication underdevelopment for gastroparesis --if she was using this during her gastric emptying study the test was likely falsely negative  Who is writing this prescription for her? I do not see an immediate problem with using it

## 2018-08-01 NOTE — Telephone Encounter (Signed)
Spoke with pt and she is aware.

## 2018-08-01 NOTE — Patient Instructions (Signed)
Description   Continue taking 1.5 tablets daily except 1 tablet on Sundays.  Recheck INR in 3 weeks. Coumadin Clinic [phone] Main [phone]

## 2018-08-01 NOTE — Telephone Encounter (Signed)
I am okay if she considers this pharmaceutical study. I do not personally know much about this medication as it is still in development If she is not having gastroparesis symptoms such as nausea, vomiting, bloating, early satiety on a regular basis I am not sure she would be a candidate but would defer to the study coordinators.

## 2018-08-01 NOTE — Telephone Encounter (Signed)
She has not started it yet she just wanted to get your opinion on the drug and didn't want to take anything you thought may be harmful.

## 2018-08-06 ENCOUNTER — Encounter: Payer: Self-pay | Admitting: Internal Medicine

## 2018-08-06 ENCOUNTER — Ambulatory Visit (INDEPENDENT_AMBULATORY_CARE_PROVIDER_SITE_OTHER): Payer: Medicare HMO | Admitting: Internal Medicine

## 2018-08-06 VITALS — BP 120/70 | HR 76 | Ht <= 58 in | Wt 151.1 lb

## 2018-08-06 DIAGNOSIS — Z86010 Personal history of colonic polyps: Secondary | ICD-10-CM

## 2018-08-06 DIAGNOSIS — K5900 Constipation, unspecified: Secondary | ICD-10-CM

## 2018-08-06 DIAGNOSIS — K3184 Gastroparesis: Secondary | ICD-10-CM | POA: Diagnosis not present

## 2018-08-06 DIAGNOSIS — E1143 Type 2 diabetes mellitus with diabetic autonomic (poly)neuropathy: Secondary | ICD-10-CM | POA: Diagnosis not present

## 2018-08-06 MED ORDER — METOCLOPRAMIDE HCL 10 MG PO TABS: ORAL_TABLET | ORAL | 1 refills | Status: DC

## 2018-08-06 MED ORDER — POLYETHYLENE GLYCOL 3350 17 GM/SCOOP PO POWD: 17.0000 g | Freq: Every day | ORAL | 3 refills | Status: DC

## 2018-08-06 NOTE — Patient Instructions (Signed)
If you are age 53 or older, your body mass index should be between 23-30. Your Body mass index is 33.88 kg/m. If this is out of the aforementioned range listed, please consider follow up with your Primary Care Provider.  If you are age 30 or younger, your body mass index should be between 19-25. Your Body mass index is 33.88 kg/m. If this is out of the aformentioned range listed, please consider follow up with your Primary Care Provider.   Start Miralax 17 grams daily as directed.   Please call office in May to schedule an appointment for June or July 2020.

## 2018-08-06 NOTE — Progress Notes (Signed)
HPI: Tina [name] is a 53 yo female with PMH of colon polyps, probable gastroparesis, type 1 diabetes, hypertension, hyperlipidemia, Turner syndrome, atrial fibrillation on warfarin who is here for follow-up.  She is here today with her husband.  She was hospitalized in late 2019 with atrial fibrillation but also had issues with nausea, vomiting upper abdominal pain.  She was treated with Reglan in the hospital and told to follow-up with [place].  We recently performed a gastric emptying scan off of metoclopramide therapy which was normal.  This showed retention was less than 30% at 120 minutes.  She reports that she has been having intermittent symptoms of gastroparesis but overall symptoms are absent.  She has periods where she will have upper abdominal fullness, bloating, discomfort, nausea and rarely vomiting.  Last such episode was on Christmas day.  During these times her appetite is significantly decreased.  She has been using metoclopramide 10 mg on an as-needed basis.  She is taken 3 doses in the last 6 weeks.  In between such episodes she is not having any of these upper abdominal symptoms.  Bowels have been slightly constipated going every 3 to 4 days/week.  No blood in her stool or melena.  She did see a medication advertised for a clinical trial for gastroparesis.  This appears to be a grehlin  agonist.  She was told by the study coordinators that she may not meet criteria to enroll.  Her last colonoscopy was performed by me on 12/18/2017.  This revealed 7 polyps largest being 10 mm in size.  These were removed and found to be tubular adenoma x6 and 1 hamartomatous polyp.   Past Medical History:  Diagnosis Date  . Arthritis   . Cataract   . Diabetic retinopathy ([place])   . Diverticulosis   . DM (diabetes mellitus), type 1 ([place])   . Dyspnea   . Elevated LFTs   . Hyperlipidemia   . Hypertension   . Internal hemorrhoids   . Legally blind in right eye, as defined in [place]   .  Osteoporosis 11/2017   T score -3.8  . Retinal detachment   . Tachycardia   . Tubular adenoma of colon   . Turner's syndrome   . Visual loss, bilateral    right eye light perception only and left eye 20/100    Past Surgical History:  Procedure Laterality Date  . CATARACT EXTRACTION    . CHOLECYSTECTOMY    . Hepatic adenoma removed    . RETINAL DETACHMENT SURGERY    . right eye surgery      3 years ago  . TONSILLECTOMY      Outpatient Medications Prior to Visit  Medication Sig Dispense Refill  . Continuous Blood Gluc Receiver (FREESTYLE LIBRE READER) DEVI by Does not apply route.    . Continuous Blood Gluc Sensor ([place]) MISC by Does not apply route.    . diltiazem (CARDIZEM CD) 240 MG 24 hr capsule Take 1 capsule (240 mg total) by mouth daily. 90 capsule 3  . glucose blood (ONE TOUCH ULTRA TEST) test strip USE TO TEST 6 TO 8 TIMES DAILY AS NEEDED    . glucose blood test strip USE TO TEST 6 TO 8 TIMES DAILY AS NEEDED    . Insulin Human (INSULIN PUMP) SOLN Inject 1 each into the skin continuous. Novolog 100 units/ml - basal rate 33 units/day and averaging 55 units/day with meals    . losartan (COZAAR) 100 MG tablet  Take 1 tablet (100 mg total) by mouth daily. 90 tablet 3  . magnesium oxide (MAG-OX) 400 MG tablet Take 400 mg by mouth daily.    . methocarbamol (ROBAXIN) 500 MG tablet Take 500 mg by mouth 4 (four) times daily as needed for muscle spasms.   0  . rosuvastatin (CRESTOR) 20 MG tablet Take 20 mg by mouth daily.    Marland Kitchen triamcinolone cream (KENALOG) 0.1 % Apply 1 application topically 2 (two) times daily as needed (ezcema). Arms and hands  1  . Vitamin D, Ergocalciferol, (DRISDOL) 50000 units CAPS capsule Take 1 capsule (50,000 Units total) by mouth every 7 (seven) days. 12 capsule 0  . warfarin (COUMADIN) 5 MG tablet Take 1 tablet (5 mg total) by mouth daily. 30 tablet 3  . metoCLOPramide (REGLAN) 10 MG tablet Take 1 tablet (10 mg total) by mouth every  8 (eight) hours as needed for nausea. (Patient taking differently: Take 10 mg by mouth as needed for nausea. ) 30 tablet 0   No facility-administered medications prior to visit.     Allergies  Allergen Reactions  . Acetazolamide Anaphylaxis and Other (See Comments)     Reaction, drop in Blood pressure that caused patient to stay in bed for 5 days, inconherient  Reaction, drop in Blood pressure that caused patient to stay in bed for 5 days, inconherient  . Ace Inhibitors Cough    Family History  Problem Relation Age of Onset  . Cancer Father        kidney  . Diabetes Maternal Grandmother   . Emphysema Paternal Grandfather   . Esophageal cancer Paternal Aunt   . Colon cancer Neg Hx   . Colon polyps Neg Hx   . Rectal cancer Neg Hx   . Stomach cancer Neg Hx     Social History   Tobacco Use  . Smoking status: Former Smoker    Packs/day: 0.20    Years: 0.50    Pack years: 0.10    Types: Cigarettes    Last attempt to quit: 07/17/1988    Years since quitting: 30.0  . Smokeless tobacco: Never Used  . Tobacco comment: many years ago  Substance Use Topics  . Alcohol use: Yes    Comment: social  . Drug use: No    ROS: As per history of present illness, otherwise negative  BP 120/70 (BP Location: Left Arm, Patient Position: Sitting, Cuff Size: Normal)   Pulse 76   Ht 4\' 8"  (1.422 m) Comment: height measured without shoes  Wt 151 lb 2 oz (68.5 kg)   BMI 33.88 kg/m  Gen: awake, alert, NAD HEENT: anicteric, op clear CV: RRR, no mrg Pulm: CTA b/l Abd: soft, NT/ND, +BS throughout Ext: no c/c/e Neuro: nonfocal   RELEVANT LABS AND IMAGING: CBC    Component Value Date/Time   WBC 12.2 (H) 06/14/2018 0603   RBC 3.89 06/14/2018 0603   HGB 12.0 06/14/2018 0603   HCT 36.1 06/14/2018 0603   PLT 208 06/14/2018 0603   MCV 92.8 06/14/2018 0603   MCH 30.8 06/14/2018 0603   MCHC 33.2 06/14/2018 0603   RDW 13.3 06/14/2018 0603   LYMPHSABS 1.3 06/14/2018 0603   MONOABS 1.1 (H)  06/14/2018 0603   EOSABS 0.0 06/14/2018 0603   BASOSABS 0.1 06/14/2018 0603    CMP     Component Value Date/Time   NA 144 07/04/2018 1443   K 4.5 07/04/2018 1443   CL 107 (H) 07/04/2018 1443   CO2  25 07/04/2018 1443   GLUCOSE 82 07/04/2018 1443   GLUCOSE 145 (H) 06/14/2018 0603   BUN 27 (H) 07/04/2018 1443   CREATININE 1.12 (H) 07/04/2018 1443   CALCIUM 8.9 07/04/2018 1443   PROT 7.3 06/11/2018 1112   ALBUMIN 3.5 06/11/2018 1112   AST 23 06/11/2018 1112   ALT 33 06/11/2018 1112   ALKPHOS 123 06/11/2018 1112   BILITOT 1.2 06/11/2018 1112   GFRNONAA 57 (L) 07/04/2018 1443   GFRAA 65 07/04/2018 1443    ASSESSMENT/PLAN: 53 yo female with PMH of colon polyps, probable gastroparesis, type 1 diabetes, hypertension, hyperlipidemia, Turner syndrome, atrial fibrillation on warfarin who is here for follow-up.  1.  Gastroparesis --symptoms are intermittent, and despite a normal gastric emptying study I feel that she very likely has gastroparesis.  This is most likely to be symptomatic during other acute illnesses or when her blood sugars are poorly controlled.  We discussed this today.  We will continue to use metoclopramide 10 mg 3 times daily before meals and at bedtime but on a very limited an as-needed basis.  We discussed the long-term risk of Reglan which I think is extremely unlikely with intermittent and sporadic use.  We reviewed gastroparesis diet when symptoms are troublesome.  2.  Mild constipation --begin MiraLAX therapy 17 g daily.  She can titrate this for efficacy.  If stools are too loose she can decrease to half dose daily or 1 full dose every other day.  3.  History of adenomatous colon polyps -- 6 adenomatous polyps removed last June with fair prep.  Would plan to repeat this study with 2-day prep in the summer 2020.  We can discuss this at follow-up.  Followup about 6 months  25 minutes spent with the patient today. Greater than 50% was spent in counseling and  coordination of care with the patient     Cc:Vernie Shanks, [place], [place] [address]

## 2018-08-12 ENCOUNTER — Other Ambulatory Visit: Payer: Self-pay

## 2018-08-12 MED ORDER — WARFARIN SODIUM 5 MG PO TABS: ORAL_TABLET | ORAL | 1 refills | Status: DC

## 2018-08-13 ENCOUNTER — Telehealth: Payer: Self-pay | Admitting: Cardiovascular Disease

## 2018-08-13 NOTE — Telephone Encounter (Signed)
Left Rip Harbour a message to call the office back and request to speak with a triage nurse, for further assistance with home health orders.

## 2018-08-13 NOTE — Telephone Encounter (Signed)
New message      Rip Harbour from [facility] stated that she faxed over Home health orders to [phone] and never got an update. Please follow up

## 2018-08-14 NOTE — Telephone Encounter (Signed)
Orders in to be signed folder on Dr Camillia Herter cart ./cy

## 2018-08-16 ENCOUNTER — Telehealth: Payer: Self-pay

## 2018-08-16 NOTE — Telephone Encounter (Signed)
Pt's husband called states pt missed her last night's dosage of Coumadin, ran out of Coumadin, but now has new bottle/supply.  Missed 1.5 tablets yesterday, INR was 2.6 on 08/01/18.  Advised to have pt take 2 tablets tonight (normal dose would have been 1.5 tablets), then resume previous dosage regimen. Pt's husband verbalized understanding.

## 2018-08-22 ENCOUNTER — Ambulatory Visit (INDEPENDENT_AMBULATORY_CARE_PROVIDER_SITE_OTHER): Payer: Medicare HMO | Admitting: Pharmacist

## 2018-08-22 DIAGNOSIS — I4891 Unspecified atrial fibrillation: Secondary | ICD-10-CM

## 2018-08-22 DIAGNOSIS — Z5181 Encounter for therapeutic drug level monitoring: Secondary | ICD-10-CM

## 2018-08-22 LAB — POCT INR: INR: 2.8 (ref 2.0–3.0)

## 2018-08-22 NOTE — Patient Instructions (Signed)
Description   Continue taking 1.5 tablets daily except 1 tablet on Sundays.  Recheck INR in 4 weeks. [facility] [phone] Main [phone]

## 2018-08-28 DIAGNOSIS — H179 Unspecified corneal scar and opacity: Secondary | ICD-10-CM | POA: Diagnosis not present

## 2018-08-28 DIAGNOSIS — Z961 Presence of intraocular lens: Secondary | ICD-10-CM | POA: Diagnosis not present

## 2018-08-28 DIAGNOSIS — H18452 Nodular corneal degeneration, left eye: Secondary | ICD-10-CM | POA: Diagnosis not present

## 2018-08-28 DIAGNOSIS — H18602 Keratoconus, unspecified, left eye: Secondary | ICD-10-CM | POA: Diagnosis not present

## 2018-08-28 DIAGNOSIS — H35372 Puckering of macula, left eye: Secondary | ICD-10-CM | POA: Diagnosis not present

## 2018-09-02 ENCOUNTER — Telehealth: Payer: Self-pay | Admitting: Cardiovascular Disease

## 2018-09-02 NOTE — Telephone Encounter (Signed)
Patient with diagnosis of Afib on warfarin for anticoagulation.    Procedure: endoscopy Date of procedure: TBD  CHADS2-VASc score of  3 (CHF, HTN, AGE, DM1, stroke/tia x 2, CAD, AGE, female)  Per office protocol, patient can hold warfarin for 5 days prior to procedure.  Patient will not need bridging with Lovenox (enoxaparin) around procedure.   Spoke with Colletta Maryland who needs this faxed to [phone]. They will let us know when procedure is scheduled so that we can give pt instructions and schedule appropriate follow up.   This has been faxed as above.

## 2018-09-02 NOTE — Telephone Encounter (Signed)
New Message    Colletta Maryland from [facility] wants to know how long patient should hold the coumadin before Endoscopy is done.  No date on when the Endoscopy is going to be done.

## 2018-09-10 NOTE — Telephone Encounter (Signed)
Pt called to report that she will have procedure on 09/18/18. Hold 5 days prior to procedure. Pt repeats back instructions and will schedule follow up appt post procedure while here with husband for his warfarin check.

## 2018-09-13 DIAGNOSIS — I480 Paroxysmal atrial fibrillation: Secondary | ICD-10-CM | POA: Diagnosis not present

## 2018-09-13 DIAGNOSIS — I10 Essential (primary) hypertension: Secondary | ICD-10-CM | POA: Diagnosis not present

## 2018-09-13 DIAGNOSIS — Z7901 Long term (current) use of anticoagulants: Secondary | ICD-10-CM | POA: Diagnosis not present

## 2018-09-13 DIAGNOSIS — E103553 Type 1 diabetes mellitus with stable proliferative diabetic retinopathy, bilateral: Secondary | ICD-10-CM | POA: Diagnosis not present

## 2018-09-13 DIAGNOSIS — Q969 Turner's syndrome, unspecified: Secondary | ICD-10-CM | POA: Diagnosis not present

## 2018-09-13 DIAGNOSIS — E669 Obesity, unspecified: Secondary | ICD-10-CM | POA: Diagnosis not present

## 2018-09-13 DIAGNOSIS — E785 Hyperlipidemia, unspecified: Secondary | ICD-10-CM | POA: Diagnosis not present

## 2018-09-17 DIAGNOSIS — E103553 Type 1 diabetes mellitus with stable proliferative diabetic retinopathy, bilateral: Secondary | ICD-10-CM | POA: Diagnosis not present

## 2018-09-17 DIAGNOSIS — Z794 Long term (current) use of insulin: Secondary | ICD-10-CM | POA: Diagnosis not present

## 2018-09-17 DIAGNOSIS — Q969 Turner's syndrome, unspecified: Secondary | ICD-10-CM | POA: Diagnosis not present

## 2018-09-17 DIAGNOSIS — Z7901 Long term (current) use of anticoagulants: Secondary | ICD-10-CM | POA: Diagnosis not present

## 2018-09-17 DIAGNOSIS — I10 Essential (primary) hypertension: Secondary | ICD-10-CM | POA: Diagnosis not present

## 2018-09-17 DIAGNOSIS — I480 Paroxysmal atrial fibrillation: Secondary | ICD-10-CM | POA: Diagnosis not present

## 2018-09-17 DIAGNOSIS — E785 Hyperlipidemia, unspecified: Secondary | ICD-10-CM | POA: Diagnosis not present

## 2018-09-17 DIAGNOSIS — E669 Obesity, unspecified: Secondary | ICD-10-CM | POA: Diagnosis not present

## 2018-09-25 DIAGNOSIS — H43812 Vitreous degeneration, left eye: Secondary | ICD-10-CM | POA: Diagnosis not present

## 2018-09-25 DIAGNOSIS — H3582 Retinal ischemia: Secondary | ICD-10-CM | POA: Diagnosis not present

## 2018-09-25 DIAGNOSIS — H3581 Retinal edema: Secondary | ICD-10-CM | POA: Diagnosis not present

## 2018-09-25 DIAGNOSIS — E103592 Type 1 diabetes mellitus with proliferative diabetic retinopathy without macular edema, left eye: Secondary | ICD-10-CM | POA: Diagnosis not present

## 2018-09-25 DIAGNOSIS — H35372 Puckering of macula, left eye: Secondary | ICD-10-CM | POA: Diagnosis not present

## 2018-09-27 MED FILL — NovoLOG 100 UNIT/ML SOLN: 100 | 38 days supply | Qty: 40 | Fill #1

## 2018-10-03 ENCOUNTER — Ambulatory Visit (INDEPENDENT_AMBULATORY_CARE_PROVIDER_SITE_OTHER): Payer: Medicare HMO | Admitting: Pharmacist

## 2018-10-03 ENCOUNTER — Other Ambulatory Visit: Payer: Self-pay

## 2018-10-03 DIAGNOSIS — Z5181 Encounter for therapeutic drug level monitoring: Secondary | ICD-10-CM

## 2018-10-03 DIAGNOSIS — I4891 Unspecified atrial fibrillation: Secondary | ICD-10-CM

## 2018-10-03 LAB — POCT INR: INR: 2.1 (ref 2.0–3.0)

## 2018-10-03 NOTE — Patient Instructions (Signed)
Continue taking 1.5 tablets daily except 1 tablet on Sundays.  Recheck INR in 6 weeks. [facility] [phone] Main [phone]

## 2018-10-14 ENCOUNTER — Telehealth: Payer: Self-pay

## 2018-10-14 NOTE — Telephone Encounter (Signed)
Called pt regarding 10/16/18 f/u appt  Attempted to set up VIDEO visit for pt Pt not agreeable to video visit at this time Pt was agreeable to PHONE visit  Pt states she is "doing great" Pt verbally consented to PHONE visit on 10-16-18 11:20am with Dr. Angelena Form.      Virtual Visit Pre-Appointment Phone Call  Steps For Call:  1. Confirm consent - "In the setting of the current Covid19 crisis, you are scheduled for a PHONE visit with your provider on 10-16-2018 at 11:20am.  Just as we do with many in-office visits, in order for you to participate in this visit, we must obtain consent.  If you'd like, I can send this to your mychart (if signed up) or email for you to review.  Otherwise, I can obtain your verbal consent now.  All virtual visits are billed to your insurance company just like a normal visit would be.  By agreeing to a virtual visit, we'd like you to understand that the technology does not allow for your provider to perform an examination, and thus may limit your provider's ability to fully assess your condition.  Finally, though the technology is pretty good, we cannot assure that it will always work on either your or our end, and in the setting of a video visit, we may have to convert it to a phone-only visit.  In either situation, we cannot ensure that we have a secure connection.  Are you willing to proceed?"  2. Give patient instructions for WebEx download to smartphone as below if video visit  3. Advise patient to be prepared with any vital sign or heart rhythm information, their current medicines, and a piece of paper and pen handy for any instructions they may receive the day of their visit  4. Inform patient they will receive a phone call 15 minutes prior to their appointment time (may be from unknown caller ID) so they should be prepared to answer  5. Confirm that appointment type is correct in Epic appointment notes (video vs telephone)    TELEPHONE CALL NOTE  Renea  Valenti-Cohen has been deemed a candidate for a follow-up tele-health visit to limit community exposure during the Covid-19 pandemic. I spoke with the patient via phone to ensure availability of phone/video source, confirm preferred email & phone number, and discuss instructions and expectations.  I reminded Azuree Valenti-Cohen to be prepared with any vital sign and/or heart rhythm information that could potentially be obtained via home monitoring, at the time of her visit. I reminded Brunilda Valenti-Cohen to expect a phone call at the time of her visit if her visit.  Did the patient verbally acknowledge consent to treatment? YES  Wilma Flavin, RN 10/14/2018 2:17 PM

## 2018-10-15 DIAGNOSIS — I480 Paroxysmal atrial fibrillation: Secondary | ICD-10-CM | POA: Diagnosis not present

## 2018-10-15 DIAGNOSIS — E785 Hyperlipidemia, unspecified: Secondary | ICD-10-CM | POA: Diagnosis not present

## 2018-10-15 DIAGNOSIS — I10 Essential (primary) hypertension: Secondary | ICD-10-CM | POA: Diagnosis not present

## 2018-10-16 ENCOUNTER — Other Ambulatory Visit: Payer: Self-pay

## 2018-10-16 ENCOUNTER — Telehealth: Payer: Medicare HMO | Admitting: Cardiovascular Disease

## 2018-10-16 NOTE — Progress Notes (Unsigned)
No chief complaint on file.   History of Present Illness: 53 yo female with history of DM, HTN, hyperlipidemia, Turner's syndrome and paroxysmal atrial fibrillation who is being seen today via E-visit for cardiac follow up. This E-visit is being conducted during the Covid pandemic.  I saw her in 2012 as a new patient for evaluation of SOB and palpitations. She reported tachycardia. She described palpitations when anxious. I arranged a 48 Hour Holter monitor and an echo. Her Holter monitor in 2012 showed PACs but no SVT. Her echo in 2012 was normal. Nuclear stress test in December 2017 in the setting of chest pain with no ischemia. She was admitted to [facility] 06/11/18 with c/o nausea and weakness and was found to have atrial fibrillation with RVR. She was started on IV Cardizem and IV heparin and converted to sinus. Echo 06/12/18 with LVEF=60-65%. No significant valve disease. CHADS VASC score 3. She was started on Eliquis but could not afford so changed to coumadin.   She tells me today that she ***.  The patient denies any chest pain, dyspnea, palpitations, lower extremity edema, orthopnea, PND, dizziness, near syncope or syncope.      Primary Care Physician:  Vernie Shanks, MD  Past Medical History:  Diagnosis Date  . Arthritis   . Cataract   . Diabetic retinopathy ([place])   . Diverticulosis   . DM (diabetes mellitus), type 1 ([place])   . Dyspnea   . Elevated LFTs   . Hyperlipidemia   . Hypertension   . Internal hemorrhoids   . Legally blind in right eye, as defined in [place]   . Osteoporosis 11/2017   T score -3.8  . Retinal detachment   . Tachycardia   . Tubular adenoma of colon   . Turner's syndrome   . Visual loss, bilateral    right eye light perception only and left eye 20/100    Past Surgical History:  Procedure Laterality Date  . CATARACT EXTRACTION    . CHOLECYSTECTOMY    . Hepatic adenoma removed    . RETINAL DETACHMENT SURGERY    . right eye surgery      3 years ago   . TONSILLECTOMY      Current Outpatient Medications  Medication Sig Dispense Refill  . Continuous Blood Gluc Receiver (FREESTYLE LIBRE READER) DEVI by Does not apply route.    . Continuous Blood Gluc Sensor ([place]) MISC by Does not apply route.    . diltiazem (CARDIZEM CD) 240 MG 24 hr capsule Take 1 capsule (240 mg total) by mouth daily. 90 capsule 3  . glucose blood (ONE TOUCH ULTRA TEST) test strip USE TO TEST 6 TO 8 TIMES DAILY AS NEEDED    . glucose blood test strip USE TO TEST 6 TO 8 TIMES DAILY AS NEEDED    . Insulin Human (INSULIN PUMP) SOLN Inject 1 each into the skin continuous. Novolog 100 units/ml - basal rate 33 units/day and averaging 55 units/day with meals    . losartan (COZAAR) 100 MG tablet Take 1 tablet (100 mg total) by mouth daily. 90 tablet 3  . magnesium oxide (MAG-OX) 400 MG tablet Take 400 mg by mouth daily.    . methocarbamol (ROBAXIN) 500 MG tablet Take 500 mg by mouth 4 (four) times daily as needed for muscle spasms.   0  . metoCLOPramide (REGLAN) 10 MG tablet TID AC and QHS prn 120 tablet 1  . polyethylene glycol powder (GLYCOLAX/MIRALAX) powder Take  17 g by mouth daily. 1530 g 3  . rosuvastatin (CRESTOR) 20 MG tablet Take 20 mg by mouth daily.    Marland Kitchen triamcinolone cream (KENALOG) 0.1 % Apply 1 application topically 2 (two) times daily as needed (ezcema). Arms and hands  1  . Vitamin D, Ergocalciferol, (DRISDOL) 50000 units CAPS capsule Take 1 capsule (50,000 Units total) by mouth every 7 (seven) days. 12 capsule 0  . warfarin (COUMADIN) 5 MG tablet Take as directed by [facility] 135 tablet 1   No current facility-administered medications for this visit.     Allergies  Allergen Reactions  . Acetazolamide Anaphylaxis and Other (See Comments)     Reaction, drop in Blood pressure that caused patient to stay in bed for 5 days, inconherient  Reaction, drop in Blood pressure that caused patient to stay in bed for 5 days, inconherient  .  Ace Inhibitors Cough    Social History   Socioeconomic History  . Marital status: Married    Spouse name: Not on file  . Number of children: Not on file  . Years of education: Not on file  . Highest education level: Not on file  Occupational History  . Occupation: Disabled  Social Needs  . Financial resource strain: Not on file  . Food insecurity:    Worry: Not on file    Inability: Not on file  . Transportation needs:    Medical: Not on file    Non-medical: Not on file  Tobacco Use  . Smoking status: Former Smoker    Packs/day: 0.20    Years: 0.50    Pack years: 0.10    Types: Cigarettes    Last attempt to quit: 07/17/1988    Years since quitting: 30.2  . Smokeless tobacco: Never Used  . Tobacco comment: many years ago  Substance and Sexual Activity  . Alcohol use: Yes    Comment: social  . Drug use: No  . Sexual activity: Yes    Partners: Male    Comment: 1ST INTERCOURSE- 13, partners- 7, married- 24 yrs   Lifestyle  . Physical activity:    Days per week: Not on file    Minutes per session: Not on file  . Stress: Not on file  Relationships  . Social connections:    Talks on phone: Not on file    Gets together: Not on file    Attends religious service: Not on file    Active member of club or organization: Not on file    Attends meetings of clubs or organizations: Not on file    Relationship status: Not on file  . Intimate partner violence:    Fear of current or ex partner: Not on file    Emotionally abused: Not on file    Physically abused: Not on file    Forced sexual activity: Not on file  Other Topics Concern  . Not on file  Social History Narrative   Married, disabled due to visual problems. She is legally blind.   No children.   No caffeine reported.    Family History  Problem Relation Age of Onset  . Cancer Father        kidney  . Diabetes Maternal Grandmother   . Emphysema Paternal Grandfather   . Esophageal cancer Paternal Aunt   . Colon  cancer Neg Hx   . Colon polyps Neg Hx   . Rectal cancer Neg Hx   . Stomach cancer Neg Hx  Review of Systems:  As stated in the HPI and otherwise negative.   There were no vitals taken for this visit.  Physical Examination: ***  Echo 06/12/18: - Left ventricle: The cavity size was normal. Systolic function was   normal. The estimated ejection fraction was in the range of 60%   to 65%. Wall motion was normal; there were no regional wall   motion abnormalities. Abnormal relaxation with increased filling   pressures. - Aortic valve: There was no regurgitation. - Aortic root: The aortic root was normal in size. - Mitral valve: There was trivial regurgitation. - Right ventricle: Systolic function was normal. - Atrial septum: No defect or patent foramen ovale was identified. - Tricuspid valve: There was no significant regurgitation. - Inferior vena cava: The vessel was normal in size. The   respirophasic diameter changes were in the normal range (>= 50%),   consistent with normal central venous pressure. - Pericardium, extracardiac: A trivial pericardial effusion was   identified.  EKG:  EKG is not ordered today. The ekg ordered today demonstrates   Recent Labs: 06/11/2018: ALT 33 06/13/2018: TSH 1.567 06/14/2018: Hemoglobin 12.0; Magnesium 1.9; Platelets 208 07/04/2018: BUN 27; Creatinine, Ser 1.12; Potassium 4.5; Sodium 144   Lipid Panel    Component Value Date/Time   CHOL 273 (H) 10/06/2009 2131   TRIG 87 10/06/2009 2131   HDL 93 10/06/2009 2131   CHOLHDL 2.9 Ratio 10/06/2009 2131   VLDL 17 10/06/2009 2131   LDLCALC 163 (H) 10/06/2009 2131     Wt Readings from Last 3 Encounters:  08/06/18 151 lb 2 oz (68.5 kg)  06/19/18 151 lb 12.8 oz (68.9 kg)  06/11/18 149 lb 7.6 oz (67.8 kg)     Other studies Reviewed: Additional studies/ records that were reviewed today include: . Review of the above records demonstrates:   Assessment and Plan:   1. Paroxysmal Atrial  fibrillation/PACs: She is in sinus today. Will continue continue coumadin and Cardizem. CHADS VASC score 3. She cannot afford Eliquis.    ***plan from here 2. HTN: BP is elevated today. Will increase Cardizem to 240 mg daily. She will continue to use Cozaar 50 mg daily in the am. If BP remains above 140/90 after the increase in Cardizem, will ask her to increase Cozaar to 100 mg daily.   Current medicines are reviewed at length with the patient today.  The patient does not have concerns regarding medicines.  The following changes have been made:  no change  Labs/ tests ordered today include:   No orders of the defined types were placed in this encounter.   Disposition:   FU with me in 4  months  Signed, Lauree Chandler, MD 10/16/2018 6:27 AM    [facility] [address], [place], [place]  [address] Phone: [phone]; Fax: [phone]

## 2018-10-16 NOTE — Telephone Encounter (Signed)
Attempted to reach patient by phone (precall) x3 this morning.  No answer.  Message was left for patient to call back to reschedule visit.

## 2018-10-18 ENCOUNTER — Emergency Department (HOSPITAL_COMMUNITY): Payer: HMO

## 2018-10-18 ENCOUNTER — Encounter (HOSPITAL_COMMUNITY): Payer: Self-pay | Admitting: *Deleted

## 2018-10-18 ENCOUNTER — Inpatient Hospital Stay (HOSPITAL_COMMUNITY): Payer: HMO

## 2018-10-18 ENCOUNTER — Other Ambulatory Visit: Payer: Self-pay

## 2018-10-18 ENCOUNTER — Inpatient Hospital Stay (HOSPITAL_COMMUNITY)
Admission: EM | Admit: 2018-10-18 | Discharge: 2018-11-11 | DRG: 064 | Disposition: A | Discharge status: Skilled Nursing Facility | Payer: HMO | Attending: Neurology | Admitting: Neurology

## 2018-10-18 DIAGNOSIS — I5032 Chronic diastolic (congestive) heart failure: Secondary | ICD-10-CM | POA: Diagnosis not present

## 2018-10-18 DIAGNOSIS — H548 Legal blindness, as defined in USA: Secondary | ICD-10-CM | POA: Diagnosis present

## 2018-10-18 DIAGNOSIS — G4733 Obstructive sleep apnea (adult) (pediatric): Secondary | ICD-10-CM | POA: Diagnosis not present

## 2018-10-18 DIAGNOSIS — Z6833 Body mass index (BMI) 33.0-33.9, adult: Secondary | ICD-10-CM

## 2018-10-18 DIAGNOSIS — G936 Cerebral edema: Secondary | ICD-10-CM | POA: Diagnosis not present

## 2018-10-18 DIAGNOSIS — T45515A Adverse effect of anticoagulants, initial encounter: Secondary | ICD-10-CM | POA: Diagnosis present

## 2018-10-18 DIAGNOSIS — R402133 Coma scale, eyes open, to sound, at hospital admission: Secondary | ICD-10-CM | POA: Diagnosis not present

## 2018-10-18 DIAGNOSIS — I491 Atrial premature depolarization: Secondary | ICD-10-CM | POA: Diagnosis not present

## 2018-10-18 DIAGNOSIS — R402353 Coma scale, best motor response, localizes pain, at hospital admission: Secondary | ICD-10-CM | POA: Diagnosis present

## 2018-10-18 DIAGNOSIS — R456 Violent behavior: Secondary | ICD-10-CM | POA: Diagnosis not present

## 2018-10-18 DIAGNOSIS — E1010 Type 1 diabetes mellitus with ketoacidosis without coma: Secondary | ICD-10-CM | POA: Diagnosis not present

## 2018-10-18 DIAGNOSIS — I10 Essential (primary) hypertension: Secondary | ICD-10-CM | POA: Diagnosis not present

## 2018-10-18 DIAGNOSIS — J9601 Acute respiratory failure with hypoxia: Secondary | ICD-10-CM

## 2018-10-18 DIAGNOSIS — N179 Acute kidney failure, unspecified: Secondary | ICD-10-CM | POA: Diagnosis present

## 2018-10-18 DIAGNOSIS — R402253 Coma scale, best verbal response, oriented, at hospital admission: Secondary | ICD-10-CM | POA: Diagnosis present

## 2018-10-18 DIAGNOSIS — M810 Age-related osteoporosis without current pathological fracture: Secondary | ICD-10-CM | POA: Diagnosis present

## 2018-10-18 DIAGNOSIS — Z794 Long term (current) use of insulin: Secondary | ICD-10-CM | POA: Diagnosis not present

## 2018-10-18 DIAGNOSIS — R402362 Coma scale, best motor response, obeys commands, at arrival to emergency department: Secondary | ICD-10-CM | POA: Diagnosis not present

## 2018-10-18 DIAGNOSIS — E870 Hyperosmolality and hypernatremia: Secondary | ICD-10-CM | POA: Diagnosis not present

## 2018-10-18 DIAGNOSIS — E785 Hyperlipidemia, unspecified: Secondary | ICD-10-CM | POA: Diagnosis not present

## 2018-10-18 DIAGNOSIS — Z1159 Encounter for screening for other viral diseases: Secondary | ICD-10-CM

## 2018-10-18 DIAGNOSIS — R05 Cough: Secondary | ICD-10-CM | POA: Diagnosis present

## 2018-10-18 DIAGNOSIS — I69328 Other speech and language deficits following cerebral infarction: Secondary | ICD-10-CM | POA: Diagnosis not present

## 2018-10-18 DIAGNOSIS — Q969 Turner's syndrome, unspecified: Secondary | ICD-10-CM

## 2018-10-18 DIAGNOSIS — I618 Other nontraumatic intracerebral hemorrhage: Secondary | ICD-10-CM | POA: Diagnosis present

## 2018-10-18 DIAGNOSIS — I69391 Dysphagia following cerebral infarction: Secondary | ICD-10-CM | POA: Diagnosis not present

## 2018-10-18 DIAGNOSIS — R451 Restlessness and agitation: Secondary | ICD-10-CM

## 2018-10-18 DIAGNOSIS — R262 Difficulty in walking, not elsewhere classified: Secondary | ICD-10-CM | POA: Diagnosis not present

## 2018-10-18 DIAGNOSIS — I4891 Unspecified atrial fibrillation: Secondary | ICD-10-CM | POA: Diagnosis present

## 2018-10-18 DIAGNOSIS — M6281 Muscle weakness (generalized): Secondary | ICD-10-CM | POA: Diagnosis not present

## 2018-10-18 DIAGNOSIS — R791 Abnormal coagulation profile: Secondary | ICD-10-CM | POA: Diagnosis present

## 2018-10-18 DIAGNOSIS — G9341 Metabolic encephalopathy: Secondary | ICD-10-CM | POA: Diagnosis present

## 2018-10-18 DIAGNOSIS — I615 Nontraumatic intracerebral hemorrhage, intraventricular: Principal | ICD-10-CM | POA: Diagnosis present

## 2018-10-18 DIAGNOSIS — E10319 Type 1 diabetes mellitus with unspecified diabetic retinopathy without macular edema: Secondary | ICD-10-CM | POA: Diagnosis not present

## 2018-10-18 DIAGNOSIS — R0600 Dyspnea, unspecified: Secondary | ICD-10-CM

## 2018-10-18 DIAGNOSIS — F29 Unspecified psychosis not due to a substance or known physiological condition: Secondary | ICD-10-CM | POA: Diagnosis not present

## 2018-10-18 DIAGNOSIS — Z9641 Presence of insulin pump (external) (internal): Secondary | ICD-10-CM | POA: Diagnosis present

## 2018-10-18 DIAGNOSIS — Z87891 Personal history of nicotine dependence: Secondary | ICD-10-CM

## 2018-10-18 DIAGNOSIS — E1165 Type 2 diabetes mellitus with hyperglycemia: Secondary | ICD-10-CM | POA: Diagnosis not present

## 2018-10-18 DIAGNOSIS — I629 Nontraumatic intracranial hemorrhage, unspecified: Secondary | ICD-10-CM | POA: Insufficient documentation

## 2018-10-18 DIAGNOSIS — G4710 Hypersomnia, unspecified: Secondary | ICD-10-CM | POA: Diagnosis not present

## 2018-10-18 DIAGNOSIS — R402242 Coma scale, best verbal response, confused conversation, at arrival to emergency department: Secondary | ICD-10-CM | POA: Diagnosis not present

## 2018-10-18 DIAGNOSIS — R4182 Altered mental status, unspecified: Secondary | ICD-10-CM | POA: Diagnosis not present

## 2018-10-18 DIAGNOSIS — E119 Type 2 diabetes mellitus without complications: Secondary | ICD-10-CM

## 2018-10-18 DIAGNOSIS — H547 Unspecified visual loss: Secondary | ICD-10-CM | POA: Diagnosis not present

## 2018-10-18 DIAGNOSIS — IMO0001 Reserved for inherently not codable concepts without codable children: Secondary | ICD-10-CM

## 2018-10-18 DIAGNOSIS — Z20828 Contact with and (suspected) exposure to other viral communicable diseases: Secondary | ICD-10-CM | POA: Diagnosis not present

## 2018-10-18 DIAGNOSIS — I110 Hypertensive heart disease with heart failure: Secondary | ICD-10-CM | POA: Diagnosis not present

## 2018-10-18 DIAGNOSIS — I619 Nontraumatic intracerebral hemorrhage, unspecified: Secondary | ICD-10-CM | POA: Diagnosis not present

## 2018-10-18 DIAGNOSIS — I480 Paroxysmal atrial fibrillation: Secondary | ICD-10-CM | POA: Diagnosis present

## 2018-10-18 DIAGNOSIS — E10649 Type 1 diabetes mellitus with hypoglycemia without coma: Secondary | ICD-10-CM | POA: Diagnosis not present

## 2018-10-18 DIAGNOSIS — D72829 Elevated white blood cell count, unspecified: Secondary | ICD-10-CM | POA: Diagnosis not present

## 2018-10-18 DIAGNOSIS — B9620 Unspecified Escherichia coli [E. coli] as the cause of diseases classified elsewhere: Secondary | ICD-10-CM | POA: Diagnosis not present

## 2018-10-18 DIAGNOSIS — N390 Urinary tract infection, site not specified: Secondary | ICD-10-CM | POA: Diagnosis not present

## 2018-10-18 DIAGNOSIS — Z7901 Long term (current) use of anticoagulants: Secondary | ICD-10-CM

## 2018-10-18 DIAGNOSIS — Z79899 Other long term (current) drug therapy: Secondary | ICD-10-CM

## 2018-10-18 DIAGNOSIS — R1310 Dysphagia, unspecified: Secondary | ICD-10-CM | POA: Diagnosis present

## 2018-10-18 DIAGNOSIS — R053 Chronic cough: Secondary | ICD-10-CM | POA: Diagnosis present

## 2018-10-18 DIAGNOSIS — Z833 Family history of diabetes mellitus: Secondary | ICD-10-CM

## 2018-10-18 DIAGNOSIS — E1310 Other specified diabetes mellitus with ketoacidosis without coma: Secondary | ICD-10-CM | POA: Diagnosis present

## 2018-10-18 DIAGNOSIS — M1990 Unspecified osteoarthritis, unspecified site: Secondary | ICD-10-CM | POA: Diagnosis present

## 2018-10-18 DIAGNOSIS — R404 Transient alteration of awareness: Secondary | ICD-10-CM | POA: Diagnosis not present

## 2018-10-18 DIAGNOSIS — R0602 Shortness of breath: Secondary | ICD-10-CM | POA: Diagnosis not present

## 2018-10-18 DIAGNOSIS — R0902 Hypoxemia: Secondary | ICD-10-CM | POA: Diagnosis not present

## 2018-10-18 DIAGNOSIS — R402142 Coma scale, eyes open, spontaneous, at arrival to emergency department: Secondary | ICD-10-CM | POA: Diagnosis not present

## 2018-10-18 DIAGNOSIS — Z781 Physical restraint status: Secondary | ICD-10-CM

## 2018-10-18 DIAGNOSIS — Z7401 Bed confinement status: Secondary | ICD-10-CM

## 2018-10-18 DIAGNOSIS — E669 Obesity, unspecified: Secondary | ICD-10-CM | POA: Diagnosis present

## 2018-10-18 LAB — POCT I-STAT EG7
Bicarbonate: 24.6 mmol/L (ref 20.0–28.0)
Calcium, Ion: 1.13 mmol/L — ABNORMAL LOW (ref 1.15–1.40)
HCT: 38 % (ref 36.0–46.0)
Hemoglobin: 12.9 g/dL (ref 12.0–15.0)
O2 Saturation: 70 %
Potassium: 4.6 mmol/L (ref 3.5–5.1)
Sodium: 141 mmol/L (ref 135–145)
TCO2: 26 mmol/L (ref 22–32)
pCO2, Ven: 37.2 mmHg — ABNORMAL LOW (ref 44.0–60.0)
pH, Ven: 7.429 (ref 7.250–7.430)
pO2, Ven: 35 mmHg (ref 32.0–45.0)

## 2018-10-18 LAB — MRSA PCR SCREENING: MRSA by PCR: NEGATIVE

## 2018-10-18 LAB — PROTIME-INR
INR: 2.6 — ABNORMAL HIGH (ref 0.8–1.2)
INR: 1 (ref 0.8–1.2)
INR: 1.2 (ref 0.8–1.2)
Prothrombin Time: 27.8 seconds — ABNORMAL HIGH (ref 11.4–15.2)
Prothrombin Time: 13.4 seconds (ref 11.4–15.2)
Prothrombin Time: 14.8 seconds (ref 11.4–15.2)

## 2018-10-18 LAB — COMPREHENSIVE METABOLIC PANEL
ALT: 69 U/L — ABNORMAL HIGH (ref 0–44)
AST: 42 U/L — ABNORMAL HIGH (ref 15–41)
Albumin: 3.2 g/dL — ABNORMAL LOW (ref 3.5–5.0)
Alkaline Phosphatase: 126 U/L (ref 38–126)
Anion gap: 11 (ref 5–15)
BUN: 24 mg/dL — ABNORMAL HIGH (ref 6–20)
CO2: 23 mmol/L (ref 22–32)
Calcium: 9.2 mg/dL (ref 8.9–10.3)
Chloride: 109 mmol/L (ref 98–111)
Creatinine, Ser: 1.36 mg/dL — ABNORMAL HIGH (ref 0.44–1.00)
GFR calc Af Amer: 52 mL/min — ABNORMAL LOW (ref >60)
GFR calc non Af Amer: 45 mL/min — ABNORMAL LOW (ref >60)
Glucose, Bld: 180 mg/dL — ABNORMAL HIGH (ref 70–99)
Potassium: 4.3 mmol/L (ref 3.5–5.1)
Sodium: 143 mmol/L (ref 135–145)
Total Bilirubin: 1.1 mg/dL (ref 0.3–1.2)
Total Protein: 6.4 g/dL — ABNORMAL LOW (ref 6.5–8.1)

## 2018-10-18 LAB — GLUCOSE, CAPILLARY
Glucose-Capillary: 436 mg/dL — ABNORMAL HIGH (ref 70–99)
Glucose-Capillary: 449 mg/dL — ABNORMAL HIGH (ref 70–99)
Glucose-Capillary: 527 mg/dL (ref 70–99)

## 2018-10-18 LAB — PHOSPHORUS: Phosphorus: 4.4 mg/dL (ref 2.5–4.6)

## 2018-10-18 LAB — TROPONIN I: Troponin I: 0.03 ng/mL (ref <0.03)

## 2018-10-18 LAB — AMMONIA: Ammonia: 9 umol/L (ref 9–35)

## 2018-10-18 LAB — MAGNESIUM: Magnesium: 2.3 mg/dL (ref 1.7–2.4)

## 2018-10-18 LAB — LIPASE, BLOOD: Lipase: 20 U/L (ref 11–51)

## 2018-10-18 LAB — ETHANOL: Alcohol, Ethyl (B): <10 mg/dL (ref <10)

## 2018-10-18 LAB — LACTIC ACID, PLASMA: Lactic Acid, Venous: 1.4 mmol/L (ref 0.5–1.9)

## 2018-10-18 LAB — CBG MONITORING, ED
Glucose-Capillary: 159 mg/dL — ABNORMAL HIGH (ref 70–99)
Glucose-Capillary: 328 mg/dL — ABNORMAL HIGH (ref 70–99)

## 2018-10-18 MED ORDER — LABETALOL HCL 5 MG/ML IV SOLN
10.0000 mg | Freq: Once | INTRAVENOUS | Status: AC
  Administered 2018-10-18: 10 mg via INTRAVENOUS
  Filled 2018-10-18: qty 4

## 2018-10-18 MED ORDER — HALOPERIDOL LACTATE 5 MG/ML IJ SOLN
1.0000 mg | Freq: Once | INTRAMUSCULAR | Status: AC
  Administered 2018-10-18: 1 mg via INTRAVENOUS
  Filled 2018-10-18: qty 1

## 2018-10-18 MED ORDER — PROMETHAZINE HCL 25 MG/ML IJ SOLN
25.0000 mg | Freq: Once | INTRAMUSCULAR | Status: DC
  Filled 2018-10-18: qty 1

## 2018-10-18 MED ORDER — DILTIAZEM HCL ER COATED BEADS 240 MG PO CP24
240.0000 mg | ORAL_CAPSULE | Freq: Every day | ORAL | Status: DC
  Administered 2018-10-21 – 2018-11-11 (×14): 240 mg via ORAL
  Filled 2018-10-18 (×24): qty 1

## 2018-10-18 MED ORDER — LORAZEPAM 2 MG/ML IJ SOLN
2.0000 mg | Freq: Once | INTRAMUSCULAR | Status: AC
  Administered 2018-10-18: 2 mg via INTRAVENOUS
  Filled 2018-10-18: qty 1

## 2018-10-18 MED ORDER — INSULIN ASPART 100 UNIT/ML ~~LOC~~ SOLN: 0.0000 [IU] | SUBCUTANEOUS | Status: DC

## 2018-10-18 MED ORDER — STROKE: EARLY STAGES OF RECOVERY BOOK
Freq: Once | Status: AC
  Administered 2018-10-18: 18:00:00

## 2018-10-18 MED ORDER — IOHEXOL 350 MG/ML SOLN
60.0000 mL | Freq: Once | INTRAVENOUS | Status: AC | PRN
  Administered 2018-10-19: 10:00:00 50 mL via INTRAVENOUS

## 2018-10-18 MED ORDER — SODIUM CHLORIDE 0.9 % IV SOLN
Freq: Once | INTRAVENOUS | Status: AC
  Administered 2018-10-18: 13:00:00 via INTRAVENOUS

## 2018-10-18 MED ORDER — PROTHROMBIN COMPLEX CONC HUMAN 500 UNITS IV KIT
1606.0000 [IU] | PACK | Freq: Once | Status: AC
  Administered 2018-10-18: 1606 [IU] via INTRAVENOUS
  Filled 2018-10-18: qty 1106

## 2018-10-18 MED ORDER — SENNOSIDES-DOCUSATE SODIUM 8.6-50 MG PO TABS
1.0000 | ORAL_TABLET | Freq: Two times a day (BID) | ORAL | Status: DC
  Administered 2018-10-22 – 2018-11-11 (×20): 1 via ORAL
  Filled 2018-10-18 (×28): qty 1

## 2018-10-18 MED ORDER — LOSARTAN POTASSIUM 50 MG PO TABS
100.0000 mg | ORAL_TABLET | Freq: Every day | ORAL | Status: DC
  Administered 2018-10-21: 11:00:00 100 mg via ORAL
  Filled 2018-10-18: qty 2

## 2018-10-18 MED ORDER — ACETAMINOPHEN 650 MG RE SUPP
650.0000 mg | RECTAL | Status: DC | PRN
  Administered 2018-10-26 – 2018-10-29 (×3): 650 mg via RECTAL
  Filled 2018-10-18 (×3): qty 1

## 2018-10-18 MED ORDER — VITAMIN K1 10 MG/ML IJ SOLN
10.0000 mg | Freq: Once | INTRAVENOUS | Status: AC
  Administered 2018-10-18: 14:00:00 10 mg via INTRAVENOUS
  Filled 2018-10-18: qty 1

## 2018-10-18 MED ORDER — INSULIN REGULAR(HUMAN) IN NACL 100-0.9 UT/100ML-% IV SOLN
INTRAVENOUS | Status: DC
  Administered 2018-10-18: 3.8 [IU]/h via INTRAVENOUS
  Administered 2018-10-18: 7.8 [IU]/h via INTRAVENOUS
  Administered 2018-10-18: 9.3 [IU]/h via INTRAVENOUS
  Administered 2018-10-19: 10.2 [IU]/h via INTRAVENOUS
  Filled 2018-10-18 (×2): qty 100

## 2018-10-18 MED ORDER — CLEVIDIPINE BUTYRATE 0.5 MG/ML IV EMUL
0.0000 mg/h | INTRAVENOUS | Status: DC
  Administered 2018-10-18 – 2018-10-19 (×2): 2 mg/h via INTRAVENOUS
  Administered 2018-10-19 (×2): 4 mg/h via INTRAVENOUS
  Administered 2018-10-20 (×2): 3 mg/h via INTRAVENOUS
  Administered 2018-10-20 – 2018-10-21 (×2): 4 mg/h via INTRAVENOUS
  Administered 2018-10-21: 5 mg/h via INTRAVENOUS
  Administered 2018-10-21: 3 mg/h via INTRAVENOUS
  Administered 2018-10-21: 4 mg/h via INTRAVENOUS
  Administered 2018-10-22 (×2): 3 mg/h via INTRAVENOUS
  Filled 2018-10-18 (×14): qty 50

## 2018-10-18 MED ORDER — ACETAMINOPHEN 160 MG/5ML PO SOLN: 650.0000 mg | ORAL | Status: DC | PRN

## 2018-10-18 MED ORDER — PANTOPRAZOLE SODIUM 40 MG IV SOLR
40.0000 mg | Freq: Every day | INTRAVENOUS | Status: DC
  Administered 2018-10-18 – 2018-10-19 (×2): 40 mg via INTRAVENOUS
  Filled 2018-10-18 (×2): qty 40

## 2018-10-18 MED ORDER — ACETAMINOPHEN 325 MG PO TABS
650.0000 mg | ORAL_TABLET | ORAL | Status: DC | PRN
  Administered 2018-10-26: 650 mg via ORAL
  Filled 2018-10-18: qty 2

## 2018-10-18 MED ORDER — LABETALOL HCL 5 MG/ML IV SOLN: 20.0000 mg | Freq: Once | INTRAVENOUS | Status: DC

## 2018-10-18 NOTE — ED Notes (Signed)
Resting with snoring, easily arouses with touch to pllace BP cuff..   Irritable and demanding to have bp cuff removed.   Able to console and distract.
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
## 2018-10-18 NOTE — ED Notes (Signed)
Dr Cheral Marker paged RE pt not tolerating CT or not tolerating bp cuff squeezing her arm.  Stated to give haldol.

## 2018-10-18 NOTE — H&P (Signed)
Neurology Consultation  Reason for Consult: Altered mental status with right thalamic hemorrhage.  Referring Physician: Charlesetta Shanks, MD  CC: Altered mental status  History is obtained from: Chart review.  HPI: Tina [name] is a 53 y.o. female with PMHx significant for insulin-dependent diabetes, hypertension, paroxysmal A. fib on Coumadin, Turner syndrome and legal blindness presented to ED with concern of altered mental status.  Apparently EMS was called yesterday for altered mental status and found patient to be hypoglycemic, improved with oral glucose and she was not transported to ED at that time.  Patient told EMS that since yesterday she does not feel back to herself, she was not reacting appropriately and staying in bed most of the day.  Sometimes it is not unusual for her to be very sleepy and occasionally becomes combative.  Blood sugar measured in 70s and her mental status did not respond to oral glucose.  When EMS arrived patient become combative and refused to answer any questions.  She did try to strike 1 of the EMS and bit her hand while getting CBG, she was given a dose of Versed for a safe transfer. Per chart review there is no recent fever or upper respiratory symptoms.  Patient and her husband were self isolating themselves for a few weeks and denies any contact.  Pertinent risk factors for stroke consist of diabetes, hypertension and paroxysmal A. fib on Coumadin.  ED course: Patient was found to be mildly hypertensive, labs with INR of 2.6 and positive troponin at 0.03, mild AKI and transaminitis, CT head with right thalamic hemorrhage with intraventricular extension, no midline shift.  She was given 10 mg of vitamin K and prothrombin complex for Coumadin reversal.  LKW: Yesterday  Premorbid modified Rankin scale (mRS): 4 0-Completely asymptomatic and back to baseline post-stroke 1-No significant post stroke disability and can perform usual duties with stroke  symptoms 2-Slight disability-UNABLE to perform all activities but does not need assistance  3-Moderate disability-requires help but walks WITHOUT assistance 4-Needs assistance to walk and tend to bodily needs 5-Severe disability-bedridden, incontinent, needs constant attention 6- Death  ICH Score: 1  ROS: Unable to obtain due to altered mental status.   Past Medical History:  Diagnosis Date  . Arthritis   . Cataract   . Diabetic retinopathy ([place])   . Diverticulosis   . DM (diabetes mellitus), type 1 ([place])   . Dyspnea   . Elevated LFTs   . Hyperlipidemia   . Hypertension   . Internal hemorrhoids   . Legally blind in right eye, as defined in [place]   . Osteoporosis 11/2017   T score -3.8  . Retinal detachment   . Tachycardia   . Tubular adenoma of colon   . Turner's syndrome   . Visual loss, bilateral    right eye light perception only and left eye 20/100    Family History  Problem Relation Age of Onset  . Cancer Father        kidney  . Diabetes Maternal Grandmother   . Emphysema Paternal Grandfather   . Esophageal cancer Paternal Aunt   . Colon cancer Neg Hx   . Colon polyps Neg Hx   . Rectal cancer Neg Hx   . Stomach cancer Neg Hx     Social History:   reports that she quit smoking about 30 years ago. Her smoking use included cigarettes. She has a 0.10 pack-year smoking history. She has never used smokeless tobacco. She reports current alcohol use. She reports  that she does not use drugs.  Medications  Current Facility-Administered Medications:  .  phytonadione (VITAMIN K) 10 mg in dextrose 5 % 50 mL IVPB, 10 mg, Intravenous, Once, Charlesetta Shanks, MD  Current Outpatient Medications:  .  Continuous Blood Gluc Receiver (FREESTYLE LIBRE READER) DEVI, by Does not apply route., Disp: , Rfl:  .  Continuous Blood Gluc Sensor ([place]) MISC, by Does not apply route., Disp: , Rfl:  .  diltiazem (CARDIZEM CD) 240 MG 24 hr capsule, Take 1 capsule  (240 mg total) by mouth daily., Disp: 90 capsule, Rfl: 3 .  glucose blood (ONE TOUCH ULTRA TEST) test strip, USE TO TEST 6 TO 8 TIMES DAILY AS NEEDED, Disp: , Rfl:  .  glucose blood test strip, USE TO TEST 6 TO 8 TIMES DAILY AS NEEDED, Disp: , Rfl:  .  Insulin Human (INSULIN PUMP) SOLN, Inject 1 each into the skin continuous. Novolog 100 units/ml - basal rate 33 units/day and averaging 55 units/day with meals, Disp: , Rfl:  .  losartan (COZAAR) 100 MG tablet, Take 1 tablet (100 mg total) by mouth daily., Disp: 90 tablet, Rfl: 3 .  magnesium oxide (MAG-OX) 400 MG tablet, Take 400 mg by mouth daily., Disp: , Rfl:  .  methocarbamol (ROBAXIN) 500 MG tablet, Take 500 mg by mouth 4 (four) times daily as needed for muscle spasms. , Disp: , Rfl: 0 .  metoCLOPramide (REGLAN) 10 MG tablet, TID AC and QHS prn, Disp: 120 tablet, Rfl: 1 .  polyethylene glycol powder (GLYCOLAX/MIRALAX) powder, Take 17 g by mouth daily., Disp: 1530 g, Rfl: 3 .  rosuvastatin (CRESTOR) 20 MG tablet, Take 20 mg by mouth daily., Disp: , Rfl:  .  triamcinolone cream (KENALOG) 0.1 %, Apply 1 application topically 2 (two) times daily as needed (ezcema). Arms and hands, Disp: , Rfl: 1 .  Vitamin D, Ergocalciferol, (DRISDOL) 50000 units CAPS capsule, Take 1 capsule (50,000 Units total) by mouth every 7 (seven) days., Disp: 12 capsule, Rfl: 0 .  warfarin (COUMADIN) 5 MG tablet, Take as directed by [facility], Disp: 135 tablet, Rfl: 1  Exam: Current vital signs: BP (!) 169/96   Pulse 88   Temp 99.6 F (37.6 C) (Rectal)   Resp (!) 21   Ht 4\' 8"  (1.422 m)   Wt 68.5 kg   SpO2 100%   BMI 33.86 kg/m  Vital signs in last 24 hours: Temp:  [99.6 F (37.6 C)] 99.6 F (37.6 C) (04/03 1103) Pulse Rate:  [88-94] 88 (04/03 1200) Resp:  [18-23] 21 (04/03 1200) BP: (137-169)/(82-96) 169/96 (04/03 1200) SpO2:  [96 %-100 %] 100 % (04/03 1200) Weight:  [68.5 kg] 68.5 kg (04/03 1104)  Physical Exam  Constitutional: Appears  well-developed and well-nourished.  Eyes: No scleral injection HENT: No OP obstrucion Head: Normocephalic.  Cardiovascular: Normal rate and regular rhythm.  Respiratory: Effort normal, non-labored breathing GI: Soft.  No distension. There is no tenderness.   Neuro: Mental Status: Patient is drowsy, not answering any orientation questions, patient was combative. No signs of aphasia or neglect. Cranial Nerves: II: Visual Fields are full. Left pupil is reactive to light. Right ocular prosthesis.  VII: Face grossly symmetric Unable to assess remainder of CN exam as patient was not cooperative and became combative. Motor: Tone is normal. Bulk is normal.  Strength may be mildly decreased to her left upper extremity but unable to assess definitively as patient was combative.  Moving all extremities and swatting  at examiners aggressively during attempts to examine. Sensory: Unable to assess formally as patient was combative. Withdraws BLE to plantar stimulation. Reflexes: Unable to obtain due to poor cooperation.  Cerebellar/Gait: Unable to obtain  Labs  CBC    Component Value Date/Time   WBC 12.2 (H) 06/14/2018 0603   RBC 3.89 06/14/2018 0603   HGB 12.9 10/18/2018 1112   HCT 38.0 10/18/2018 1112   PLT 208 06/14/2018 0603   MCV 92.8 06/14/2018 0603   MCH 30.8 06/14/2018 0603   MCHC 33.2 06/14/2018 0603   RDW 13.3 06/14/2018 0603   LYMPHSABS 1.3 06/14/2018 0603   MONOABS 1.1 (H) 06/14/2018 0603   EOSABS 0.0 06/14/2018 0603   BASOSABS 0.1 06/14/2018 0603    CMP     Component Value Date/Time   NA 141 10/18/2018 1112   NA 144 07/04/2018 1443   K 4.6 10/18/2018 1112   CL 109 10/18/2018 1050   CO2 23 10/18/2018 1050   GLUCOSE 180 (H) 10/18/2018 1050   BUN 24 (H) 10/18/2018 1050   BUN 27 (H) 07/04/2018 1443   CREATININE 1.36 (H) 10/18/2018 1050   CALCIUM 9.2 10/18/2018 1050   PROT 6.4 (L) 10/18/2018 1050   ALBUMIN 3.2 (L) 10/18/2018 1050   AST 42 (H) 10/18/2018 1050   ALT  69 (H) 10/18/2018 1050   ALKPHOS 126 10/18/2018 1050   BILITOT 1.1 10/18/2018 1050   GFRNONAA 45 (L) 10/18/2018 1050   GFRAA 52 (L) 10/18/2018 1050    Lipid Panel     Component Value Date/Time   CHOL 273 (H) 10/06/2009 2131   TRIG 87 10/06/2009 2131   HDL 93 10/06/2009 2131   CHOLHDL 2.9 Ratio 10/06/2009 2131   VLDL 17 10/06/2009 2131   LDLCALC 163 (H) 10/06/2009 2131     Imaging I have reviewed the images obtained:  CT-scan of the brain shows right thalamic hemorrhage with intraventricular extension with most of the blood located in the left lateral ventricle.  No mass-effect or midline shift.  Assessment:  53 y.o. female with PMHx significant for insulin-dependent diabetes, hypertension, paroxysmal A. fib on Coumadin, Turner syndrome and legal blindness in right eye who presented to the ED with concern of altered mental status.  1. CT head was positive for right thalamic hemorrhage with left intraventricular extension in the context of a therapeutic INR. 2. Atrial fibrillation on Coumadin. Anticoagulant discontinued due to [place].  3. DM with recent history of hyperglycemia and hypoglycemia. 4. Turner syndrome 5. Hemorrhagic stroke.  Etiology can be multifactorial, most likely hypertension but patient has Turner syndrome which increases her chances of aneurysm.  She was on Coumadin, although therapeutic but does increase her chance of bleeding. Appropriate Coumadin reversal was undertaken in the ED.  Recommendations: 1.  Frequent neuro checks. 2.  Keep systolic blood pressure below 140. 3.  No antiplatelets or anticoagulants. DVT prophylaxis with SCDs 4.  No statin due to increased risk of recurrent ICH with this class of medication in patients with prior ICH, especially those with amyloid angiopathy. 5.  MRI without contrast if it can be obtained with insulin pump. 6.  CTA head to rule out aneurysm. 7.  PT/OT consult. 8.  Speech/swallow evaluation. 9. When hemorrhage  stabilizes, in approximately 2 weeks can consider initiation of ASA for embolic stroke prevention. Less effective than warfarin but still confers some protection in atrial fibrillation. Most likely will no longer be able to be anticoagulated over the long term.   Hypertension.  Continuing home  meds of Cardizem and losartan with labetalol PRN as well as titratable clevidipine drip to keep the blood pressure below 140.   Diabetes.  Apparently patient is on insulin pump. Manage with SSI in hospital.   Paroxysmal A. fib.  Currently in sinus. Continue home dose of Cardizem.   Lorella Nimrod MD PGY3 Pager [phone] 10/18/2018, 1:28 PM   I have seen and examined the patient. I have formulated the assessment and plan. 53 year old female with right thalamic hemorrhage, most likely hypertensive. There is left lateral ventricular extension. Work up for possible underlying lesion. Admitting to ICU with plan to include BP management and frequent neuro checks.  Electronically signed: Dr. Kerney Elbe

## 2018-10-18 NOTE — ED Notes (Signed)
Pt initially refusing ct scan.  Dr Cheral Marker notified and he stated to give pt 25 mg phenergan.  However, pt calmed enough to take test.

## 2018-10-18 NOTE — Progress Notes (Signed)
   10/18/18 1700  Vitals  Temp 98.4 F (36.9 C)  Temp Source Oral  BP (!) 150/77  MAP (mmHg) 99  BP Location Right Arm  BP Method Automatic  Patient Position (if appropriate) Lying  Pulse Rate 94  ECG Heart Rate 97  Resp 16  Oxygen Therapy  SpO2 95 %  O2 Device Room Air  Pain Assessment  Pain Score 0  Glasgow Coma Scale  Eye Opening 4  Best Verbal Response (NON-intubated) 4  Best Motor Response 6  Glasgow Coma Scale Score 14  Height and Weight  Height 4\' 9"  (1.448 m)  Weight 65.7 kg  BSA (Calculated - sq m) 1.63 sq meters  BMI (Calculated) 31.33  Weight in (lb) to have BMI = 25 115.3  Provider Notification  Provider Name/Title E link  Date Provider Notified 10/18/18  Time Provider Notified [id number]  Pt arrived to [id number] at 1700.  Pt oriented to self only, alert to speech, agitated and refusing several things (wearing BP cuff, CBG monitoring).  Elink called upon arrival.  Cleviprex started for BP management.

## 2018-10-18 NOTE — ED Provider Notes (Signed)
EMERGENCY DEPARTMENT Provider Note   CSN: [id number] Arrival date & time: 10/18/18  1042    History   Chief Complaint Chief Complaint  Patient presents with   Altered Mental Status    HPI Tina [name] is a 53 y.o. female.     HPI Patient has history of diabetes on insulin pump.  At this time, all history is from EMS based on their history from patient's husband.  Yesterday early in the day, patient did not seem like herself and had a low blood sugar reading.  Reportedly she was given oral glucose during an EMS evaluation at which point the patient was not transported.  Her husband reported to EMS today however she still did not really seem back to herself over the course of the day.  She was poorly interactive and spent much of the day in bed.  Sometimes, it is not unusual for her to be fairly combative which was the case.  This morning, she continued to be poorly responsive and blood sugar was not significantly low, measured in the 70s.  He called EMS for transport.  No report of fever or complaints from the patient leading up to this.  Reportedly they have been self isolating for a number of weeks without contacts.  EMS reports patient was very combative with them and refused to answer questions.  She however used full sentences to convey her displeasure, this included directed epithets and the patient striking 1 of the EMS workers and trying to bite her hand when getting a CBG.  At that time, she was using all extremities purposely.  She still however would not provide any pertinent history for EMS.  Patient was given Versed 5 mg to facilitate transport for safety of the patient and EMS.  They report her blood pressures and heart rate have been stable throughout transport.  Oxygen level stable throughout transport.  For me, patient is not giving any useful history.  She will follow some simple commands.  Eyes are open.  She was making occasional short  comments. Past Medical History:  Diagnosis Date   Arthritis    Cataract    Diabetic retinopathy ([place])    Diverticulosis    DM (diabetes mellitus), type 1 ([place])    Dyspnea    Elevated LFTs    Hyperlipidemia    Hypertension    Internal hemorrhoids    Legally blind in right eye, as defined in [place]    Osteoporosis 11/2017   T score -3.8   Retinal detachment    Tachycardia    Tubular adenoma of colon    Turner's syndrome    Visual loss, bilateral    right eye light perception only and left eye 20/100    Patient Active Problem List   Diagnosis Date Noted   Intracranial hemorrhage ([place]) 10/18/2018   Encounter for therapeutic drug monitoring 06/19/2018   Atrial fibrillation ([place]) 06/19/2018   Atrial fibrillation with RVR ([place]) 06/11/2018   IDDM (insulin dependent diabetes mellitus) ([place]) 11/17/2013   Hypersomnolence 09/18/2013   Chronic cough 09/18/2013   Abnormal transaminases 06/04/2013   Abnormal alkaline phosphatase test 06/04/2013   PAC (premature atrial contraction) 09/14/2011   Reflux 08/26/2011   Hyperglycemia 08/24/2011   History of retinal detachment 08/24/2011   Vitreous hemorrhage ([place]) 08/24/2011   UTI (lower urinary tract infection) 08/24/2011   HTN (hypertension) 08/24/2011   Palpitations 10/25/2010    Past Surgical History:  Procedure Laterality Date   CATARACT EXTRACTION  CHOLECYSTECTOMY     Hepatic adenoma removed     RETINAL DETACHMENT SURGERY     right eye surgery      3 years ago   TONSILLECTOMY       OB History    Gravida  0   Para  0   Term  0   Preterm  0   AB  0   Living  0     SAB  0   TAB  0   Ectopic  0   Multiple  0   Live Births  0            Home Medications    Prior to Admission medications   Medication Sig Start Date End Date Taking? Authorizing Provider  diltiazem (CARDIZEM CD) 240 MG 24 hr capsule Take 1 capsule (240 mg total) by mouth daily. 06/19/18  Yes  [name], MD  Insulin Human (INSULIN PUMP) SOLN Inject 1 each into the skin continuous. Novolog 100 units/ml - basal rate 33 units/day and averaging 55 units/day with meals   Yes [provider]  losartan (COZAAR) 100 MG tablet Take 1 tablet (100 mg total) by mouth daily. 06/26/18  Yes [name], MD  magnesium oxide (MAG-OX) 400 MG tablet Take 400 mg by mouth daily.   Yes [provider]  metoCLOPramide (REGLAN) 10 MG tablet TID AC and QHS prn Patient taking differently: Take 10 mg by mouth every 8 (eight) hours as needed for nausea.  08/06/18  Yes Pyrtle, Lajuan Lines, MD  rosuvastatin (CRESTOR) 20 MG tablet Take 20 mg by mouth daily.   Yes [provider]  warfarin (COUMADIN) 5 MG tablet Take as directed by [facility] Patient taking differently: Take 5-7.5 mg by mouth See admin instructions. Take 5mg  only on Sundays, all other days take 7.5mg  (Mon-Sat). 08/12/18  Yes [name], MD  Continuous Blood Gluc Receiver (FREESTYLE LIBRE READER) DEVI by Does not apply route. 12/21/16   [provider]  Continuous Blood Gluc Sensor ([place]) MISC by Does not apply route. 12/21/16   [provider]  glucose blood (ONE TOUCH ULTRA TEST) test strip USE TO TEST 6 TO 8 TIMES DAILY AS NEEDED 09/17/17   [provider]  glucose blood test strip USE TO TEST 6 TO 8 TIMES DAILY AS NEEDED 07/20/14   [provider]  polyethylene glycol powder (GLYCOLAX/MIRALAX) powder Take 17 g by mouth daily. Patient not taking: Reported on 10/18/2018 08/06/18   Jerene Bears, MD  Vitamin D, Ergocalciferol, (DRISDOL) 50000 units CAPS capsule Take 1 capsule (50,000 Units total) by mouth every 7 (seven) days. Patient not taking: Reported on 10/18/2018 02/12/18   Princess Bruins, MD    Family History Family History  Problem Relation Age of Onset   Cancer Father        kidney   Diabetes Maternal Grandmother     Emphysema Paternal Grandfather    Esophageal cancer Paternal Aunt    Colon cancer Neg Hx    Colon polyps Neg Hx    Rectal cancer Neg Hx    Stomach cancer Neg Hx     Social History Social History   Tobacco Use   Smoking status: Former Smoker    Packs/day: 0.20    Years: 0.50    Pack years: 0.10    Types: Cigarettes    Last attempt to quit: 07/17/1988    Years since quitting: 30.2   Smokeless tobacco:  Never Used   Tobacco comment: many years ago  Substance Use Topics   Alcohol use: Yes    Comment: social   Drug use: No     Allergies   Acetazolamide and Ace inhibitors   Review of Systems Review of Systems Level 5 caveat cannot to review systems due to patient confusion level.  Physical Exam Updated Vital Signs BP (!) 171/82    Pulse (!) 101    Temp 99.6 F (37.6 C) (Rectal)    Resp 16    Ht 4\' 8"  (1.422 m)    Wt 68.5 kg    SpO2 98%    BMI 33.86 kg/m   Physical Exam Constitutional:      Comments: Patient is color is good.  She is not showing any respiratory distress.  She is awake with eyes open.  Seems slightly confused.  She is giving very limited verbal interaction  HENT:     Head:     Comments: Scalp palpated face examined without evidence of palpable contusion or hematoma.  Mucous membranes are moist airway is patent.  Extraocular motions intact, right eye prosthesis.    Nose: Nose normal.  Cardiovascular:     Rate and Rhythm: Normal rate and regular rhythm.  Pulmonary:     Effort: Pulmonary effort is normal.     Breath sounds: Normal breath sounds.  Abdominal:     General: There is no distension.     Palpations: Abdomen is soft.     Tenderness: There is no abdominal tenderness. There is no guarding.  Musculoskeletal: Normal range of motion.        General: No swelling, tenderness or deformity.  Skin:    General: Skin is warm.  Neurological:     Comments: Patient is confused and giving very limited verbal history.  She will follow commands to do  a grip strength bilaterally.  Patient makes short 1-2 sentence responses to interactions.  He can move both lower extremities spontaneously at well.      ED Treatments / Results  Labs (all labs ordered are listed, but only abnormal results are displayed) Labs Reviewed  COMPREHENSIVE METABOLIC PANEL - Abnormal; Notable for the following components:      Result Value   Glucose, Bld 180 (*)    BUN 24 (*)    Creatinine, Ser 1.36 (*)    Total Protein 6.4 (*)    Albumin 3.2 (*)    AST 42 (*)    ALT 69 (*)    GFR calc non Af Amer 45 (*)    GFR calc Af Amer 52 (*)    All other components within normal limits  TROPONIN I - Abnormal; Notable for the following components:   Troponin I 0.03 (*)    All other components within normal limits  PROTIME-INR - Abnormal; Notable for the following components:   Prothrombin Time 27.8 (*)    INR 2.6 (*)    All other components within normal limits  CBG MONITORING, ED - Abnormal; Notable for the following components:   Glucose-Capillary 159 (*)    All other components within normal limits  POCT I-STAT EG7 - Abnormal; Notable for the following components:   pCO2, Ven 37.2 (*)    Calcium, Ion 1.13 (*)    All other components within normal limits  URINE CULTURE  CULTURE, BLOOD (ROUTINE X 2)  CULTURE, BLOOD (ROUTINE X 2)  ETHANOL  LIPASE, BLOOD  LACTIC ACID, PLASMA  AMMONIA  MAGNESIUM  PHOSPHORUS  PROTIME-INR  URINALYSIS, ROUTINE W REFLEX MICROSCOPIC  RAPID URINE DRUG SCREEN, HOSP PERFORMED  PROTIME-INR  PROTIME-INR  I-STAT VENOUS BLOOD GAS, ED    EKG EKG Interpretation  Date/Time:  Friday October 18 2018 10:42:53 EDT Ventricular Rate:  92 PR Interval:    QRS Duration: 89 QT Interval:  378 QTC Calculation: 468 R Axis:   90 Text Interpretation:  Sinus rhythm Borderline short PR interval Probable left atrial enlargement Borderline right axis deviation Low voltage, precordial leads Minimal ST depression, inferior leads no sig change from  previous Confirmed by Charlesetta Shanks [phone]) on 10/18/2018 3:43:26 PM   Radiology Ct Head Wo Contrast  Result Date: 10/18/2018 CLINICAL DATA:  Altered level of consciousness. EXAM: CT HEAD WITHOUT CONTRAST TECHNIQUE: Contiguous axial images were obtained from the base of the skull through the vertex without intravenous contrast. COMPARISON:  06/11/2018 FINDINGS: Brain: Hemorrhage noted in the right thalamus measuring 16 x 12 mm. Blood fills the left lateral ventricle. Blood is also layering dependently posteriorly in the right lateral ventricle. No mass effect or midline shift. Vascular: No hyperdense vessel or unexpected calcification. Skull: No acute calvarial abnormality. Sinuses/Orbits: Visualized paranasal sinuses and mastoids clear. Right globe/orbit implant noted. Other: None IMPRESSION: Right thalamic hemorrhage with intraventricular extension. Most of the blood is located in the left lateral ventricle. Critical Value/emergent results were called by telephone at the time of interpretation on 10/18/2018 at 11:57 am to Dr. Charlesetta Shanks , who verbally acknowledged these results. Electronically Signed   By: Rolm Baptise M.D.   On: 10/18/2018 12:00    Procedures Procedures (including critical care time) CRITICAL CARE Performed by: Charlesetta Shanks   Total critical care time: 30 minutes  Critical care time was exclusive of separately billable procedures and treating other patients.  Critical care was necessary to treat or prevent imminent or life-threatening deterioration.  Critical care was time spent personally by me on the following activities: development of treatment plan with patient and/or surrogate as well as nursing, discussions with consultants, evaluation of patient's response to treatment, examination of patient, obtaining history from patient or surrogate, ordering and performing treatments and interventions, ordering and review of laboratory studies, ordering and review of  radiographic studies, pulse oximetry and re-evaluation of patient's condition. Medications Ordered in ED Medications   stroke: mapping our early stages of recovery book (has no administration in time range)  acetaminophen (TYLENOL) tablet 650 mg (has no administration in time range)    Or  acetaminophen (TYLENOL) solution 650 mg (has no administration in time range)    Or  acetaminophen (TYLENOL) suppository 650 mg (has no administration in time range)  senna-docusate (Senokot-S) tablet 1 tablet (has no administration in time range)  pantoprazole (PROTONIX) injection 40 mg (has no administration in time range)  labetalol (NORMODYNE,TRANDATE) injection 20 mg (has no administration in time range)    And  clevidipine (CLEVIPREX) infusion 0.5 mg/mL (has no administration in time range)  diltiazem (CARDIZEM CD) 24 hr capsule 240 mg (has no administration in time range)  losartan (COZAAR) tablet 100 mg (has no administration in time range)  insulin aspart (novoLOG) injection 0-15 Units (has no administration in time range)  0.9 %  sodium chloride infusion ( Intravenous New Bag/Given 10/18/18 1242)  prothrombin complex conc human (KCENTRA) IVPB 1,606 Units (0 Units Intravenous Stopped 10/18/18 1309)  phytonadione (VITAMIN K) 10 mg in dextrose 5 % 50 mL IVPB (10 mg Intravenous New Bag/Given 10/18/18 1345)  labetalol (NORMODYNE,TRANDATE) injection 10 mg (10 mg  Intravenous Given 10/18/18 1532)     Initial Impression / Assessment and Plan / ED Course  I have reviewed the triage vital signs and the nursing notes.  Pertinent labs & imaging results that were available during my care of the patient were reviewed by me and considered in my medical decision making (see chart for details).  Clinical Course as of Oct 17 1540  Fri Oct 18, 2018  1223 I have contacted the patient's husband and given updates on findings of CT scan.  Have advised that review with neurology and neurosurgery is pending for further  information as to plans and prognosis.   [MP]  1241 Have added a text message to neurologist for consult.   [MP]  1301 Consult: Reviewed with Dr. Cheral Marker neurology.  We will proceed with admission.   [MP]  1322 Have reordered consult to neurosurgery.   [MP]  24 Patient's husband updated on additional consults and admission plan.  Dated on patient's current condition.   [MP]    Clinical Course User Index [MP] Charlesetta Shanks, MD      Patient presents with confusion and mental status change.  Initially this was thought to be due to hypoglycemia.  CT head however illustrates intra-cranial hemorrhage.  Is anticoagulated on Coumadin.  Kcentra initiated as soon as bleed identified.  Consultations made to neurology and neurosurgery.  Patient's airway remained stable.  Final Clinical Impressions(s) / ED Diagnoses   Final diagnoses:  Anticoagulated  Right-sided nontraumatic intracerebral hemorrhage, unspecified cerebral location [facility])  ICH (intracerebral hemorrhage) [facility])    ED Discharge Orders    None       Charlesetta Shanks, MD 10/18/18 1544

## 2018-10-18 NOTE — Progress Notes (Signed)
Blood glucose of 436.  Will progress to phase 2 of the ICU hyperglycemia protocol, IV insulin.  This order has been placed.  Roland Rack, MD [facility] [phone]  If 7pm- 7am, please page neurology on call as listed in [place].

## 2018-10-18 NOTE — ED Notes (Signed)
TT neuro resident RE continued elevated bp.  Labetalol ordered.

## 2018-10-18 NOTE — ED Notes (Signed)
Patient transported to CT 

## 2018-10-18 NOTE — Consult Note (Signed)
Reason for Consult: Thalamic hemorrhage Referring Physician: Emergency department  Tina [name] is an 53 y.o. female.  HPI: 53 year old female with altered mental status.  No history of trauma.  No history of malignancy.  No history of anticoagulant use.  Patient denies headache.  She does have some mild weakness of her left side.  CT scan demonstrates evidence of a small right thalamic hemorrhage with some secondary extension into her third ventricle and with blood within her left lateral ventricle.  No evidence of hydrocephalus or mass-effect.  Past Medical History:  Diagnosis Date  . Arthritis   . Cataract   . Diabetic retinopathy ([place])   . Diverticulosis   . DM (diabetes mellitus), type 1 ([place])   . Dyspnea   . Elevated LFTs   . Hyperlipidemia   . Hypertension   . Internal hemorrhoids   . Legally blind in right eye, as defined in [place]   . Osteoporosis 11/2017   T score -3.8  . Retinal detachment   . Tachycardia   . Tubular adenoma of colon   . Turner's syndrome   . Visual loss, bilateral    right eye light perception only and left eye 20/100    Past Surgical History:  Procedure Laterality Date  . CATARACT EXTRACTION    . CHOLECYSTECTOMY    . Hepatic adenoma removed    . RETINAL DETACHMENT SURGERY    . right eye surgery      3 years ago  . TONSILLECTOMY      Family History  Problem Relation Age of Onset  . Cancer Father        kidney  . Diabetes Maternal Grandmother   . Emphysema Paternal Grandfather   . Esophageal cancer Paternal Aunt   . Colon cancer Neg Hx   . Colon polyps Neg Hx   . Rectal cancer Neg Hx   . Stomach cancer Neg Hx     Social History:  reports that she quit smoking about 30 years ago. Her smoking use included cigarettes. She has a 0.10 pack-year smoking history. She has never used smokeless tobacco. She reports current alcohol use. She reports that she does not use drugs.  Allergies:  Allergies  Allergen Reactions  . Acetazolamide  Anaphylaxis and Other (See Comments)     Reaction, drop in Blood pressure that caused patient to stay in bed for 5 days, inconherient  Reaction, drop in Blood pressure that caused patient to stay in bed for 5 days, inconherient  . Ace Inhibitors Cough    Medications: I have reviewed the patient's current medications.  Results for orders placed or performed during the hospital encounter of 10/18/18 (from the past 48 hour(s))  CBG monitoring, ED     Status: Abnormal   Collection Time: 10/18/18 10:35 AM  Result Value Ref Range   Glucose-Capillary 159 (H) 70 - 99 mg/dL  Comprehensive metabolic panel     Status: Abnormal   Collection Time: 10/18/18 10:50 AM  Result Value Ref Range   Sodium 143 135 - 145 mmol/L   Potassium 4.3 3.5 - 5.1 mmol/L   Chloride 109 98 - 111 mmol/L   CO2 23 22 - 32 mmol/L   Glucose, Bld 180 (H) 70 - 99 mg/dL   BUN 24 (H) 6 - 20 mg/dL   Creatinine, Ser 1.36 (H) 0.44 - 1.00 mg/dL   Calcium 9.2 8.9 - 10.3 mg/dL   Total Protein 6.4 (L) 6.5 - 8.1 g/dL   Albumin 3.2 (L) 3.5 -  5.0 g/dL   AST 42 (H) 15 - 41 U/L   ALT 69 (H) 0 - 44 U/L   Alkaline Phosphatase 126 38 - 126 U/L   Total Bilirubin 1.1 0.3 - 1.2 mg/dL   GFR calc non Af Amer 45 (L) >60 mL/min   GFR calc Af Amer 52 (L) >60 mL/min   Anion gap 11 5 - 15    Comment: Performed at [facility] [address] [place], [place] [address]  Ethanol     Status: None   Collection Time: 10/18/18 10:50 AM  Result Value Ref Range   Alcohol, Ethyl (B) <10 <10 mg/dL    Comment: (NOTE) Lowest detectable limit for serum alcohol is 10 mg/dL. For medical purposes only. Performed at [facility], [place] [address], [place], [place] [address]   Lipase, blood     Status: None   Collection Time: 10/18/18 10:50 AM  Result Value Ref Range   Lipase 20 11 - 51 U/L    Comment: Performed at [facility] [address] [place], [place] [address]  Troponin I - Once     Status: Abnormal   Collection Time:  10/18/18 10:50 AM  Result Value Ref Range   Troponin I 0.03 (HH) <0.03 ng/mL    Comment: CRITICAL RESULT CALLED TO, READ BACK BY AND VERIFIED WITH: MICHAELSON,B RN @ 1207 10/18/18 LEONARD,A Performed at [facility], [place] [address] [place], [place] [address]   Lactic acid, plasma     Status: None   Collection Time: 10/18/18 10:50 AM  Result Value Ref Range   Lactic Acid, Venous 1.4 0.5 - 1.9 mmol/L    Comment: Performed at [facility] [address], [place], [place] [address]  Protime-INR     Status: Abnormal   Collection Time: 10/18/18 10:50 AM  Result Value Ref Range   Prothrombin Time 27.8 (H) 11.4 - 15.2 seconds   INR 2.6 (H) 0.8 - 1.2    Comment: (NOTE) INR goal varies based on device and disease states. Performed at [facility], [place] [address], [place], [place] [address]   Ammonia     Status: None   Collection Time: 10/18/18 10:50 AM  Result Value Ref Range   Ammonia 9 9 - 35 umol/L    Comment: Performed at [facility], [place] [address], [place], [place] [address]  Magnesium     Status: None   Collection Time: 10/18/18 10:50 AM  Result Value Ref Range   Magnesium 2.3 1.7 - 2.4 mg/dL    Comment: Performed at [facility] [address] [place], [place] [address]  Phosphorus     Status: None   Collection Time: 10/18/18 10:50 AM  Result Value Ref Range   Phosphorus 4.4 2.5 - 4.6 mg/dL    Comment: Performed at [facility] [address] [place],  [address]  POCT I-Stat EG7     Status: Abnormal   Collection Time: 10/18/18 11:12 AM  Result Value Ref Range   pH, Ven 7.429 7.250 - 7.430   pCO2, Ven 37.2 (L) 44.0 - 60.0 mmHg   pO2, Ven 35.0 32.0 - 45.0 mmHg   Bicarbonate 24.6 20.0 - 28.0 mmol/L   TCO2 26 22 - 32 mmol/L   O2 Saturation 70.0 %   Sodium 141 135 - 145 mmol/L   Potassium 4.6 3.5 - 5.1 mmol/L   Calcium, Ion 1.13 (L) 1.15 -  1.40 mmol/L   HCT 38.0 36.0 - 46.0 %   Hemoglobin 12.9 12.0 - 15.0 g/dL   Patient  temperature HIDE    Sample type VENOUS    Comment NOTIFIED PHYSICIAN     Ct Head Wo Contrast  Result Date: 10/18/2018 CLINICAL DATA:  Altered level of consciousness. EXAM: CT HEAD WITHOUT CONTRAST TECHNIQUE: Contiguous axial images were obtained from the base of the skull through the vertex without intravenous contrast. COMPARISON:  06/11/2018 FINDINGS: Brain: Hemorrhage noted in the right thalamus measuring 16 x 12 mm. Blood fills the left lateral ventricle. Blood is also layering dependently posteriorly in the right lateral ventricle. No mass effect or midline shift. Vascular: No hyperdense vessel or unexpected calcification. Skull: No acute calvarial abnormality. Sinuses/Orbits: Visualized paranasal sinuses and mastoids clear. Right globe/orbit implant noted. Other: None IMPRESSION: Right thalamic hemorrhage with intraventricular extension. Most of the blood is located in the left lateral ventricle. Critical Value/emergent results were called by telephone at the time of interpretation on 10/18/2018 at 11:57 am to Dr. Charlesetta Shanks , who verbally acknowledged these results. Electronically Signed   By: Rolm Baptise M.D.   On: 10/18/2018 12:00    Review of systems not obtained due to patient factors. Blood pressure (!) 162/90, pulse 91, temperature 99.6 F (37.6 C), temperature source Rectal, resp. rate 19, height 4\' 8"  (1.422 m), weight 68.5 kg, SpO2 100 %. Patient is awake and alert.  She is oriented and mildly confused to situation.  Her speech is fluent.  Cranial nerve function normal bilateral.  Motor examination of her extremities with 4-/5 strength in her left upper extremity.  Remainder motor exam intact.  Examination head ears eyes nose and throat demonstrates no evidence of trauma or bony abnormality.  Oropharynx nasopharynx and external auditory canals are clear.  Neck is supple.  Chest and abdomen benign.  Extremities free from injury or deformity.  Assessment/Plan: Hypertensive right  thalamic hemorrhage with some secondary intraventricular hemorrhage.  Plan admission to neurology.  Plan ICU observation.  No anticoagulation.  No indication for ventriculostomy at present.  I will follow.  Mallie Mussel A Azana Kiesler 10/18/2018, 2:30 PM

## 2018-10-18 NOTE — Progress Notes (Addendum)
Pt very easily agitated, and even being combative when attempting to obtain CBG.  Haldol given, pt still very restless and agitated, pulling BP cuff off and yelling out often.  Dr. Cheral Marker notified, 2 mg Ativan ordered for time of MRI

## 2018-10-18 NOTE — Progress Notes (Signed)
CBG of 436. MD paged

## 2018-10-19 ENCOUNTER — Inpatient Hospital Stay (HOSPITAL_COMMUNITY): Payer: HMO

## 2018-10-19 ENCOUNTER — Encounter (HOSPITAL_COMMUNITY): Payer: Self-pay | Admitting: Radiology

## 2018-10-19 LAB — BLOOD CULTURE ID PANEL (REFLEXED)

## 2018-10-19 LAB — GLUCOSE, CAPILLARY
Glucose-Capillary: 102 mg/dL — ABNORMAL HIGH (ref 70–99)
Glucose-Capillary: 108 mg/dL — ABNORMAL HIGH (ref 70–99)
Glucose-Capillary: 110 mg/dL — ABNORMAL HIGH (ref 70–99)
Glucose-Capillary: 125 mg/dL — ABNORMAL HIGH (ref 70–99)
Glucose-Capillary: 135 mg/dL — ABNORMAL HIGH (ref 70–99)
Glucose-Capillary: 149 mg/dL — ABNORMAL HIGH (ref 70–99)
Glucose-Capillary: 162 mg/dL — ABNORMAL HIGH (ref 70–99)
Glucose-Capillary: 164 mg/dL — ABNORMAL HIGH (ref 70–99)
Glucose-Capillary: 172 mg/dL — ABNORMAL HIGH (ref 70–99)
Glucose-Capillary: 188 mg/dL — ABNORMAL HIGH (ref 70–99)
Glucose-Capillary: 228 mg/dL — ABNORMAL HIGH (ref 70–99)
Glucose-Capillary: 244 mg/dL — ABNORMAL HIGH (ref 70–99)
Glucose-Capillary: 251 mg/dL — ABNORMAL HIGH (ref 70–99)
Glucose-Capillary: 263 mg/dL — ABNORMAL HIGH (ref 70–99)
Glucose-Capillary: 274 mg/dL — ABNORMAL HIGH (ref 70–99)
Glucose-Capillary: 287 mg/dL — ABNORMAL HIGH (ref 70–99)
Glucose-Capillary: 311 mg/dL — ABNORMAL HIGH (ref 70–99)
Glucose-Capillary: 336 mg/dL — ABNORMAL HIGH (ref 70–99)
Glucose-Capillary: 350 mg/dL — ABNORMAL HIGH (ref 70–99)
Glucose-Capillary: 89 mg/dL (ref 70–99)
Glucose-Capillary: 93 mg/dL (ref 70–99)

## 2018-10-19 LAB — PROTIME-INR
INR: 1.1 (ref 0.8–1.2)
Prothrombin Time: 13.9 seconds (ref 11.4–15.2)

## 2018-10-19 MED ORDER — INSULIN ASPART 100 UNIT/ML ~~LOC~~ SOLN: 0.0000 [IU] | Freq: Three times a day (TID) | SUBCUTANEOUS | Status: DC

## 2018-10-19 MED ORDER — ORAL CARE MOUTH RINSE
15.0000 mL | Freq: Two times a day (BID) | OROMUCOSAL | Status: DC
  Administered 2018-10-19 – 2018-11-11 (×26): 15 mL via OROMUCOSAL

## 2018-10-19 MED ORDER — CHLORHEXIDINE GLUCONATE 0.12 % MT SOLN
15.0000 mL | Freq: Two times a day (BID) | OROMUCOSAL | Status: DC
  Administered 2018-10-19 – 2018-11-09 (×29): 15 mL via OROMUCOSAL
  Filled 2018-10-19 (×31): qty 15

## 2018-10-19 MED ORDER — INSULIN ASPART 100 UNIT/ML ~~LOC~~ SOLN: 0.0000 [IU] | SUBCUTANEOUS | Status: DC

## 2018-10-19 MED ORDER — SODIUM CHLORIDE 0.9 % IV SOLN
INTRAVENOUS | Status: DC
  Administered 2018-10-19 – 2018-10-21 (×3): via INTRAVENOUS

## 2018-10-19 NOTE — Evaluation (Signed)
Physical Therapy Evaluation Patient Details Name: Tina [name] MRN: [id number] DOB: 10/14/1965 Today's Date: 10/19/2018   History of Present Illness  53 year old female admitted 10/18/2018 with AMS. PMH: DM, HTN, Paroxysmal A Fib, Turner syndrome, legal blindness. Head CT = right thalamic hemorrhage with intraventricular extension. Most of the blood is in the left lateral ventricle.   Clinical Impression  Orders received for PT evaluation. Patient demonstrates deficits in functional mobility as indicated below. Will benefit from continued skilled PT to address deficits and maximize function. Will see as indicated and progress as tolerated.  At this time, session limited to EOB and OOB to chair. Patient with poor ability to arouse and engage. Opened eyes x2 during session and withdrew to pain in all extremities and with sternal rub. Given current deficits feel patient will need post acute rehabilitation. Recommending SNF at this time unless patient is able to arouse and maintain engagement for higher level of therapies.     Follow Up Recommendations SNF(vs CIR pending ability to arouse and participate)    Equipment Recommendations  None recommended by PT    Recommendations for Other Services       Precautions / Restrictions Precautions Precautions: Fall      Mobility  Bed Mobility Overal bed mobility: Needs Assistance Bed Mobility: Rolling;Supine to Sit Rolling: Total assist;+2 for physical assistance   Supine to sit: Total assist;+2 for physical assistance     General bed mobility comments: Patient with little to no engagement in activity, total assist to come to upright at EOB. Poor arousal   Transfers Overall transfer level: Needs assistance   Transfers: Sit to/from Stand;Stand Pivot Transfers Sit to Stand: Mod assist;+2 physical assistance Stand pivot transfers: Max assist;+2 physical assistance       General transfer comment: +2 moderate assist to power to upright  x3 during session, max assist to pivot to chair but patient was able to engage and initiate stride during pivotal movments, Max support for upright stability  Ambulation/Gait             General Gait Details: deferred due to arousal  Stairs            Wheelchair Mobility    Modified Rankin (Stroke Patients Only) Modified Rankin (Stroke Patients Only) Pre-Morbid Rankin Score: No symptoms Modified Rankin: Severe disability     Balance Overall balance assessment: Needs assistance Sitting-balance support: Bilateral upper extremity supported Sitting balance-Leahy Scale: Poor Sitting balance - Comments: some noted trunk engagement but poor ability to maintain balance without external support Postural control: Posterior lean Standing balance support: During functional activity Standing balance-Leahy Scale: Poor Standing balance comment: requires bilateral external assist                             Pertinent Vitals/Pain Pain Assessment: Faces Faces Pain Scale: No hurt    Home Living Family/patient expects to be discharged to:: Private residence Living Arrangements: Spouse/significant other Available Help at Discharge: Family Type of Home: House Home Access: Stairs to enter   [employer] of Steps: 3 Home Layout: One level Home Equipment: None Additional Comments: information taken from chart review admission 11/19    Prior Function           Comments: presumed independence prior to admission     Hand Dominance        Extremity/Trunk Assessment   Upper Extremity Assessment Upper Extremity Assessment: Difficult to assess due to impaired cognition  Lower Extremity Assessment Lower Extremity Assessment: Generalized weakness;Difficult to assess due to impaired cognition(but able to take on weight through bilateral LEs)       Communication   Communication: HOH  Cognition Arousal/Alertness: Lethargic Behavior During Therapy:  Flat affect Overall Cognitive Status: Difficult to assess                                        General Comments      Exercises     Assessment/Plan    PT Assessment Patient needs continued PT services  PT Problem List Decreased strength;Decreased activity tolerance;Decreased balance;Decreased mobility;Decreased safety awareness;Decreased cognition;Decreased coordination       PT Treatment Interventions DME instruction;Gait training;Therapeutic activities;Functional mobility training;Therapeutic exercise;Balance training;Neuromuscular re-education;Cognitive remediation;Patient/family education    PT Goals (Current goals can be found in the Care Plan section)  Acute Rehab PT Goals Patient Stated Goal: none stated PT Goal Formulation: Patient unable to participate in goal setting Time For Goal Achievement: 11/02/18 Potential to Achieve Goals: Fair    Frequency Min 3X/week   Barriers to discharge        Co-evaluation PT/OT/SLP Co-Evaluation/Treatment: Yes Reason for Co-Treatment: Complexity of the patient's impairments (multi-system involvement);Necessary to address cognition/behavior during functional activity;To address functional/ADL transfers;For patient/therapist safety PT goals addressed during session: Mobility/safety with mobility OT goals addressed during session: ADL's and self-care       AM-PAC PT "6 Clicks" Mobility  Outcome Measure Help needed turning from your back to your side while in a flat bed without using bedrails?: A Lot Help needed moving from lying on your back to sitting on the side of a flat bed without using bedrails?: A Lot Help needed moving to and from a bed to a chair (including a wheelchair)?: A Lot Help needed standing up from a chair using your arms (e.g., wheelchair or bedside chair)?: A Lot Help needed to walk in hospital room?: A Lot Help needed climbing 3-5 steps with a railing? : Total 6 Click Score: 11    End of  Session Equipment Utilized During Treatment: Gait belt;Oxygen Activity Tolerance: Patient limited by lethargy Patient left: in chair;with call bell/phone within reach;with chair alarm set Nurse Communication: Mobility status PT Visit Diagnosis: Other symptoms and signs involving the nervous system (R29.898)    Time: 4503-8882 PT Time Calculation (min) (ACUTE ONLY): 22 min   Charges:   PT Evaluation $PT Eval Moderate Complexity: 1 Mod          Alben Deeds, PT DPT  Board Certified Neurologic Specialist Acute Rehabilitation Services Pager [phone] Office [place] 10/19/2018, 2:32 PM

## 2018-10-19 NOTE — Progress Notes (Signed)
STROKE TEAM PROGRESS NOTE   HISTORY OF PRESENT ILLNESS (per record) Tina [name] is a 53 y.o. female with PMHx significant for insulin-dependent diabetes, hypertension, paroxysmal A. fib on Coumadin, Turner syndrome and legal blindness presented to ED with concern of altered mental status.  Apparently EMS was called yesterday for altered mental status and found patient to be hypoglycemic, improved with oral glucose and she was not transported to ED at that time.  Patient told EMS that since yesterday she does not feel back to herself, she was not reacting appropriately and staying in bed most of the day.  Sometimes it is not unusual for her to be very sleepy and occasionally becomes combative.  Blood sugar measured in 70s and her mental status did not respond to oral glucose.  When EMS arrived patient become combative and refused to answer any questions.  She did try to strike 1 of the EMS and bit her hand while getting CBG, she was given a dose of Versed for a safe transfer. Per chart review there is no recent fever or upper respiratory symptoms.  Patient and her husband were self isolating themselves for a few weeks and denies any contact.  Pertinent risk factors for stroke consist of diabetes, hypertension and paroxysmal A. fib on Coumadin.  ED course: Patient was found to be mildly hypertensive, labs with INR of 2.6 and positive troponin at 0.03, mild AKI and transaminitis, CT head with right thalamic hemorrhage with intraventricular extension, no midline shift.  She was given 10 mg of vitamin K and prothrombin complex for Coumadin reversal.  LKW: Yesterday  ICH Score: 1   SUBJECTIVE (INTERVAL HISTORY) Her nurse is at bedside.  Patient is awake and slightly agitated.  She does not like the restraints and keeps saying she would like them off.  Having apneic spells unclear if this is due to the sedation she received to try to get MRI will monitor.  Neurosurgery was consulted in the  emergency room, no evidence of hydrocephalus or mass-effect or reason for intervention at this time.    OBJECTIVE Vitals:   10/19/18 0700 10/19/18 0800 10/19/18 0815 10/19/18 0830  BP: (!) 132/95 132/89 (!) 143/72 (!) 150/71  Pulse: 95 97 99 97  Resp: (!) 25 (!) 25 (!) 22 (!) 26  Temp:  99.2 F (37.3 C)    TempSrc:  Axillary    SpO2: 97% (!) 88% 96% 94%  Weight:      Height:        CBC:  Recent Labs  Lab 10/18/18 1112  HGB 12.9  HCT 69.6    Basic Metabolic Panel:  Recent Labs  Lab 10/18/18 1050 10/18/18 1112  NA 143 141  K 4.3 4.6  CL 109  --   CO2 23  --   GLUCOSE 180*  --   BUN 24*  --   CREATININE 1.36*  --   CALCIUM 9.2  --   MG 2.3  --   PHOS 4.4  --     Lipid Panel:     Component Value Date/Time   CHOL 273 (H) 10/06/2009 2131   TRIG 87 10/06/2009 2131   HDL 93 10/06/2009 2131   CHOLHDL 2.9 Ratio 10/06/2009 2131   VLDL 17 10/06/2009 2131   LDLCALC 163 (H) 10/06/2009 2131   HgbA1c:  Lab Results  Component Value Date   HGBA1C 5.7 (H) 06/13/2018   Urine Drug Screen: No results found for: LABOPIA, COCAINSCRNUR, LABBENZ, AMPHETMU, THCU, LABBARB  Alcohol Level  Component Value Date/Time   ETH <10 10/18/2018 1050    IMAGING  Ct Head Wo Contrast 10/18/2018 IMPRESSION:  Right thalamic hemorrhage with intraventricular extension. Most of the blood is located in the left lateral ventricle.    Mr Brain Wo Contrast 10/18/2018 IMPRESSION:  Severely motion degraded examination showing unchanged size of intraparenchymal hematoma centered in the right thalamus with intraventricular extension.    CT Angiogram Head - pending   Dg Chest Port 1 View 10/18/2018 IMPRESSION:  No active disease.     Transthoracic Echocardiogram  06/12/2018 Study Conclusions - Left ventricle: The cavity size was normal. Systolic function was   normal. The estimated ejection fraction was in the range of 60%   to 65%. Wall motion was normal; there were no regional  wall   motion abnormalities. Abnormal relaxation with increased filling   pressures. - Aortic valve: There was no regurgitation. - Aortic root: The aortic root was normal in size. - Mitral valve: There was trivial regurgitation. - Right ventricle: Systolic function was normal. - Atrial septum: No defect or patent foramen ovale was identified. - Tricuspid valve: There was no significant regurgitation. - Inferior vena cava: The vessel was normal in size. The   respirophasic diameter changes were in the normal range (>= 50%),   consistent with normal central venous pressure. - Pericardium, extracardiac: A trivial pericardial effusion was   identified. Impressions: - Normal LV EF, no significant valvular abnormalities.   EKG - SR rate 92 BPM. (See cardiology reading for complete details)   PHYSICAL EXAM Blood pressure (!) 150/71, pulse 97, temperature 99.2 F (37.3 C), temperature source Axillary, resp. rate (!) 26, height 4\' 9"  (1.448 m), weight 65.7 kg, SpO2 94 %.   Patient is awake, normal rate and rhythm, nonlabored breathing clear to auscultation, patient slightly agitated and not following commands, not answering any questions however she is verbalizing that she does not like the restraints and would like them off, right ocular prosthesis, blinks to threat in the left eye, tone and bulk appear normal however difficult due to agitation, moving all extremities however moving left side less than the right, cannot perform a complete motor exam due to noncooperation, withdraws extremities x4, unable to test reflexes, cerebellar, gait due to noncooperation.   ASSESSMENT/PLAN [name] is a 53 y.o. female with history of insulin-dependent diabetes, hypertension, paroxysmal A. fib on Coumadin, Turner syndrome and legal blindness  presenting with AMS, mildly hypertensive and combative. She did not receive IV t-PA due to hemorrhage.  Right thalamic hemorrhage with  intraventricular extension  Resultant possibly left decreased motor strength however difficult to examine due to agitation and noncooperation.  CT head - Right thalamic hemorrhage with intraventricular extension. Most of the blood is located in the left lateral ventricle.   MRI head - Severely motion degraded examination showing unchanged size of intraparenchymal hematoma centered in the right thalamus with intraventricular extension.   MRA head - not performed  CTA Head - pending  Carotid Doppler - not indicated  2D Echo - 06/12/2018 - EF 60 - 65%.   LDL - 163  HgbA1c - 5.7  UDS - pending  VTE prophylaxis - SCDs  Diet - NPO  warfarin daily prior to admission, now on No antithrombotic  Ongoing aggressive stroke risk factor management  Therapy recommendations:  pending  Disposition:  Pending  Hypertension  Stable (Cleviprex prn) . SBP goal < 160 mm HG . Long-term BP goal normotensive  Hyperlipidemia  Lipid lowering medication  PTA: Crestor 20 mg daily  LDL 163, goal < 70  Current lipid lowering medication: none - would increase to 40 mg daily at time of discharge  Continue statin at discharge  Diabetes  HgbA1c 5.7, goal < 7.0  Controlled  Other Stroke Risk Factors  Former cigarette smoker - quit 30 years ago  ETOH use, advised to drink no more than 1 alcoholic beverage per day.  Obesity, Body mass index is 31.34 kg/m., recommend weight loss, diet and exercise as appropriate   PAF   Other Active Problems  INR - 2.6 on admission - reversed -> 1.1  NS consult - Dr Annette Stable - No indication for ventriculostomy at present. He will follow.  AKI - creatinine - 1.36 - monitor  Troponin - 0.03 consistently   Potentially combative - prn Haldol and ativan ordered  Hyperglycemia - now on IV insulin  Hospital day # 1 Patient with hypertensive right thalamic hemorrhage with minimal secondary intraventricular extension, no need for neurosurgical  intervention at this time, ICU observation, no anticoagulation, no indication for ventriculostomy.  Patient slightly agitated but moving all extremities, observed apneas unclear if due to sedation given to try to get MRI will monitor.  Personally examined patient and images, and have participated in and made any corrections needed to history, physical, neuro exam,assessment and plan as stated above.  I have personally obtained the history, evaluated lab date, reviewed imaging studies and agree with radiology interpretations.    Sarina Ill, MD Stroke Neurology   A total of 25 minutes was spent for the care of this patient, spent on counseling patient and family on different diagnostic and therapeutic options, counseling and coordination of care, riskd ans benefits of management, compliance, or risk factor reduction and education.   To contact Stroke Continuity provider, please refer to [email or web address] After hours, contact General Neurology

## 2018-10-19 NOTE — Progress Notes (Addendum)
Pt attempting to kick RN in head during CBG check. MD order for ankle restraints. CBG 84. MD order to D/C IV insulin. MD will order SQ insulin. New order in [facility].

## 2018-10-19 NOTE — Progress Notes (Signed)
PHARMACY - PHYSICIAN COMMUNICATION CRITICAL VALUE ALERT - BLOOD CULTURE IDENTIFICATION (BCID)  Tina [name] is an 53 y.o. female who presented to [facility] on 10/18/2018 with a chief complaint of altered mental status  Assessment: 53 year old female admitted with thalamic hemorrhage    Current antibiotics: None  Changes to prescribed antibiotics recommended:  None - likely contaminant - 1 bottle growing MRSE  Results for orders placed or performed during the hospital encounter of 10/18/18  Blood Culture ID Panel (Reflexed) (Collected: 10/18/2018 10:50 AM)  Result Value Ref Range   Enterococcus species NOT DETECTED NOT DETECTED   Listeria monocytogenes NOT DETECTED NOT DETECTED   Staphylococcus species DETECTED (A) NOT DETECTED   Staphylococcus aureus (BCID) NOT DETECTED NOT DETECTED   Methicillin resistance DETECTED (A) NOT DETECTED   Streptococcus species NOT DETECTED NOT DETECTED   Streptococcus agalactiae NOT DETECTED NOT DETECTED   Streptococcus pneumoniae NOT DETECTED NOT DETECTED   Streptococcus pyogenes NOT DETECTED NOT DETECTED   Acinetobacter baumannii NOT DETECTED NOT DETECTED   Enterobacteriaceae species NOT DETECTED NOT DETECTED   Enterobacter cloacae complex NOT DETECTED NOT DETECTED   Escherichia coli NOT DETECTED NOT DETECTED   Klebsiella oxytoca NOT DETECTED NOT DETECTED   Klebsiella pneumoniae NOT DETECTED NOT DETECTED   Proteus species NOT DETECTED NOT DETECTED   Serratia marcescens NOT DETECTED NOT DETECTED   Haemophilus influenzae NOT DETECTED NOT DETECTED   Neisseria meningitidis NOT DETECTED NOT DETECTED   Pseudomonas aeruginosa NOT DETECTED NOT DETECTED   Candida albicans NOT DETECTED NOT DETECTED   Candida glabrata NOT DETECTED NOT DETECTED   Candida krusei NOT DETECTED NOT DETECTED   Candida parapsilosis NOT DETECTED NOT DETECTED   Candida tropicalis NOT DETECTED NOT DETECTED    Tad Moore 10/19/2018  8:58 AM

## 2018-10-19 NOTE — Evaluation (Signed)
Occupational Therapy Evaluation Patient Details Name: Tina [name] MRN: [id number] DOB: 18-Jun-1966 Today's Date: 10/19/2018    History of Present Illness 53 year old female admitted 10/18/2018 with AMS. PMH: DM, HTN, Paroxysmal A Fib, Turner syndrome, legal blindness. Head CT = right thalamic hemorrhage with intraventricular extension. Most of the blood is in the left lateral ventricle.    Clinical Impression   Upon arrival, pt supine in bed and sleeping heavily. Pt unable to provide PLOF information due to lethargy. Pt requiring Max-Total A for ADLs and Mod-Max A +2 for functional transfer to recliner. Pt with poor engagement and requiring Max hand over hand and Max cues for performance of grooming at EOB. Pt withdrawing from sternal rub and pain. Pt would benefit from further acute OT to facilitate safe dc. Recommend dc to SNF for further OT to optimize safety, independence with ADLs, and return to PLOF.      Follow Up Recommendations  SNF;Supervision/Assistance - 24 hour    Equipment Recommendations  Other (comment)(Defer to next venue)    Recommendations for Other Services PT consult;Speech consult     Precautions / Restrictions Precautions Precautions: Fall      Mobility Bed Mobility Overal bed mobility: Needs Assistance Bed Mobility: Rolling;Supine to Sit Rolling: Total assist;+2 for physical assistance   Supine to sit: Total assist;+2 for physical assistance     General bed mobility comments: Patient with little to no engagement in activity, total assist to come to upright at EOB. Poor arousal   Transfers Overall transfer level: Needs assistance   Transfers: Sit to/from Stand;Stand Pivot Transfers Sit to Stand: Mod assist;+2 physical assistance Stand pivot transfers: Max assist;+2 physical assistance       General transfer comment: +2 moderate assist to power to upright x3 during session, max assist to pivot to chair but patient was able to engage and initiate  stride during pivotal movments, Max support for upright stability    Balance Overall balance assessment: Needs assistance Sitting-balance support: Bilateral upper extremity supported Sitting balance-Leahy Scale: Poor Sitting balance - Comments: some noted trunk engagement but poor ability to maintain balance without external support Postural control: Posterior lean Standing balance support: During functional activity Standing balance-Leahy Scale: Poor Standing balance comment: requires bilateral external assist                           ADL either performed or assessed with clinical judgement   ADL Overall ADL's : Needs assistance/impaired                                       General ADL Comments: Max-Total A for ADLs due to lethargy and cognition     Vision         Perception     Praxis      Pertinent Vitals/Pain Pain Assessment: Faces Faces Pain Scale: No hurt Pain Intervention(s): Monitored during session     Hand Dominance     Extremity/Trunk Assessment Upper Extremity Assessment Upper Extremity Assessment: Difficult to assess due to impaired cognition;RUE deficits/detail;LUE deficits/detail RUE Deficits / Details: Difficulty maintaining grasp on yonker for oral care  RUE Coordination: decreased fine motor;decreased gross motor LUE Deficits / Details: Able to bring hand to mouth to scratch lip LUE Coordination: decreased fine motor;decreased gross motor   Lower Extremity Assessment Lower Extremity Assessment: Defer to PT evaluation   Cervical /  Trunk Assessment Cervical / Trunk Assessment: Other exceptions Cervical / Trunk Exceptions: Poor trunk control.   Communication Communication Communication: HOH   Cognition Arousal/Alertness: Lethargic Behavior During Therapy: Flat affect Overall Cognitive Status: Difficult to assess                                 General Comments: Pt maintaining her eye closed and  snoring during session. Pt engaging in oral care task, but requiring Max hand over hand and cues.   General Comments  VSS    Exercises     Shoulder Instructions      Home Living Family/patient expects to be discharged to:: Private residence Living Arrangements: Spouse/significant other Available Help at Discharge: Family Type of Home: House Home Access: Stairs to enter [employer] of Steps: 3   Home Layout: One level               Home Equipment: None   Additional Comments: information taken from chart review admission 11/19      Prior Functioning/Environment          Comments: presumed independence prior to admission        OT Problem List: Decreased strength;Decreased range of motion;Decreased activity tolerance;Impaired balance (sitting and/or standing);Decreased cognition;Decreased safety awareness;Decreased knowledge of use of DME or AE;Decreased knowledge of precautions;Decreased coordination;Impaired UE functional use      OT Treatment/Interventions: Self-care/ADL training;Therapeutic exercise;Energy conservation;DME and/or AE instruction;Therapeutic activities;Patient/family education    OT Goals(Current goals can be found in the care plan section) Acute Rehab OT Goals Patient Stated Goal: none stated OT Goal Formulation: Patient unable to participate in goal setting Time For Goal Achievement: 11/02/18 Potential to Achieve Goals: Good  OT Frequency: Min 2X/week   Barriers to D/C:            Co-evaluation PT/OT/SLP Co-Evaluation/Treatment: Yes Reason for Co-Treatment: Complexity of the patient's impairments (multi-system involvement);For patient/therapist safety;To address functional/ADL transfers PT goals addressed during session: Mobility/safety with mobility OT goals addressed during session: ADL's and self-care      AM-PAC OT "6 Clicks" Daily Activity     Outcome Measure Help from another person eating meals?: Total Help from  another person taking care of personal grooming?: A Lot Help from another person toileting, which includes using toliet, bedpan, or urinal?: Total Help from another person bathing (including washing, rinsing, drying)?: Total Help from another person to put on and taking off regular upper body clothing?: Total Help from another person to put on and taking off regular lower body clothing?: Total 6 Click Score: 7   End of Session Equipment Utilized During Treatment: Gait belt Nurse Communication: Mobility status  Activity Tolerance: Patient limited by lethargy Patient left: in chair;with call bell/phone within reach;with chair alarm set;with restraints reapplied  OT Visit Diagnosis: Unsteadiness on feet (R26.81);Other abnormalities of gait and mobility (R26.89);Muscle weakness (generalized) (M62.81);Other symptoms and signs involving cognitive function                Time: 0315-9458 OT Time Calculation (min): 22 min Charges:  OT General Charges $OT Visit: 1 Visit OT Evaluation $OT Eval Moderate Complexity: [place], OTR/L Acute Rehab Pager: [phone] Office: [place] 10/19/2018, 3:02 PM

## 2018-10-19 NOTE — Evaluation (Signed)
Clinical/Bedside Swallow Evaluation Patient Details  Name: Tina [name] MRN: [id number] Date of Birth: 1966/03/13  Today's Date: 10/19/2018 Time: SLP Start Time (ACUTE ONLY): 1150 SLP Stop Time (ACUTE ONLY): 1205 SLP Time Calculation (min) (ACUTE ONLY): 15 min  Past Medical History:  Past Medical History:  Diagnosis Date  . Arthritis   . Cataract   . Diabetic retinopathy ([place])   . Diverticulosis   . DM (diabetes mellitus), type 1 ([place])   . Dyspnea   . Elevated LFTs   . Hyperlipidemia   . Hypertension   . Internal hemorrhoids   . Legally blind in right eye, as defined in [place]   . Osteoporosis 11/2017   T score -3.8  . Retinal detachment   . Tachycardia   . Tubular adenoma of colon   . Turner's syndrome   . Visual loss, bilateral    right eye light perception only and left eye 20/100   Past Surgical History:  Past Surgical History:  Procedure Laterality Date  . CATARACT EXTRACTION    . CHOLECYSTECTOMY    . Hepatic adenoma removed    . RETINAL DETACHMENT SURGERY    . right eye surgery      3 years ago  . TONSILLECTOMY     HPI:  53 year old female admitted 10/18/2018 with AMS. PMH: DM, HTN, Paroxysmal A Fib, Turner syndrome, legal blindness. Head CT = right thalamic hemorrhage with intraventricular extension. Most of the blood is in the left lateral ventricle.   Assessment / Plan / Recommendation Clinical Impression  Pt allowed oral care with suction. Oral cavity noted to be dry, likely due to NPO status and open-mouth breathing. Adequate dentition observed. Pt was given a small individual ice chip, and was noted to chew it appropriately. No cough response noted following trials of ice chips. Pt unable to drink from a straw to assess thin liquids. Pt at high risk for aspiration given lethargy and loud snoring respirations. Recommend strict NPO status until pt is more alert and able to participate. RN informed.    SLP Visit Diagnosis: Dysphagia, unspecified (R13.10)     Aspiration Risk  Severe aspiration risk;Risk for inadequate nutrition/hydration    Diet Recommendation NPO   Medication Administration: Via alternative means    Other  Recommendations Oral Care Recommendations: Oral care QID   Follow up Recommendations (TBD)      Frequency and Duration min 2x/week  1 week;2 weeks       Prognosis Prognosis for Safe Diet Advancement: Good Barriers to Reach Goals: Behavior      Swallow Study   General Date of Onset: 10/18/18 HPI: 53 year old female admitted 10/18/2018 with AMS. PMH: DM, HTN, Paroxysmal A Fib, Turner syndrome, legal blindness. Head CT = right thalamic hemorrhage with intraventricular extension. Most of the blood is in the left lateral ventricle. Type of Study: Bedside Swallow Evaluation Previous Swallow Assessment: none found Diet Prior to this Study: NPO Temperature Spikes Noted: No Respiratory Status: Room air History of Recent Intubation: No Behavior/Cognition: Lethargic/Drowsy;Uncooperative;Doesn't follow directions Oral Cavity Assessment: Dry Oral Care Completed by SLP: Yes Oral Cavity - Dentition: Adequate natural dentition Vision: Impaired for self-feeding(legally blind) Self-Feeding Abilities: Total assist Patient Positioning: Upright in bed Baseline Vocal Quality: Low vocal intensity Volitional Cough: Cognitively unable to elicit Volitional Swallow: Unable to elicit    Oral/Motor/Sensory Function Overall Oral Motor/Sensory Function: Within functional limits   Ice Chips Ice chips: Impaired Oral Phase Impairments: Poor awareness of bolus   Thin Liquid  Thin Liquid: Not tested    Nectar Thick Nectar Thick Liquid: Not tested   Honey Thick Honey Thick Liquid: Not tested   Puree Puree: Not tested   Solid     Solid: Not tested     Tina [name] B. Quentin Ore [facility], [place] Speech Language Pathologist [phone]  [name] 10/19/2018,12:08 PM

## 2018-10-19 NOTE — Progress Notes (Signed)
Pt continues to have short apneic episodes throughout the day. Sats this time dropped into the 40's. MD paged. Order for bipap given. Respiratory at bedside when sats dropped and placed pt on bipap to titrate per RT

## 2018-10-19 NOTE — Progress Notes (Signed)
OT Cancellation Note  Patient Details Name: Tina [name] MRN: [id number] DOB: 05-31-1966   Cancelled Treatment:    Reason Eval/Treat Not Completed: Active bedrest order;Patient not medically ready(Will return schedule as schedule. Thank you.)  [place], OTR/L Acute Rehab Pager: [phone] Office: [phone] 10/19/2018, 7:12 AM

## 2018-10-19 NOTE — Progress Notes (Signed)
PT Cancellation Note  Patient Details Name: Tina [name] MRN: [id number] DOB: [birth year]   Cancelled Treatment:    Reason Eval/Treat Not Completed: Patient not medically ready;Active bedrest order   Duncan Dull 10/19/2018, 7:07 AM Alben Deeds, PT DPT  Board Certified Neurologic Specialist Acute Rehabilitation Services Pager [phone] Office [phone]

## 2018-10-19 NOTE — Progress Notes (Signed)
No new issues or problems overnight.  Patient remains confused.  She is awake.  She follows some simple commands.  She still moves her left side somewhat less spontaneously than the right.  Follow-up head CT scan demonstrates stable appearance of her thalamic hemorrhage and intraventricular hemorrhage.  No evidence of progressive hydrocephalus or other new problem.  Continue ICU observation and supportive care.  No indication for ventricular drainage at this point.

## 2018-10-19 NOTE — Progress Notes (Signed)
BiPAP contraindication per [facility] NIV Protocol  On arrival patient is confused and unresponsive to voice commands, furthermore patient is currently restrained w/ a posey vest and bilateral wrist restraints. Patient was removed from NIV machine due to several contraindications and placed on a HFNC [place] at 10L. Patient does have some snoring respirations, SATs are currently stable at 100% . RN aware of BiPAP removal and updated on Respiratory Care protocol. Will continue to monitor patient as needed.

## 2018-10-20 ENCOUNTER — Inpatient Hospital Stay (HOSPITAL_COMMUNITY): Payer: HMO

## 2018-10-20 DIAGNOSIS — Z7901 Long term (current) use of anticoagulants: Secondary | ICD-10-CM

## 2018-10-20 DIAGNOSIS — E1010 Type 1 diabetes mellitus with ketoacidosis without coma: Secondary | ICD-10-CM

## 2018-10-20 DIAGNOSIS — I619 Nontraumatic intracerebral hemorrhage, unspecified: Secondary | ICD-10-CM | POA: Diagnosis present

## 2018-10-20 DIAGNOSIS — J9601 Acute respiratory failure with hypoxia: Secondary | ICD-10-CM

## 2018-10-20 DIAGNOSIS — I615 Nontraumatic intracerebral hemorrhage, intraventricular: Principal | ICD-10-CM

## 2018-10-20 DIAGNOSIS — G9340 Encephalopathy, unspecified: Secondary | ICD-10-CM

## 2018-10-20 LAB — CBC
HCT: 42.9 % (ref 36.0–46.0)
Hemoglobin: 12.8 g/dL (ref 12.0–15.0)
MCH: 30.3 pg (ref 26.0–34.0)
MCHC: 29.8 g/dL — ABNORMAL LOW (ref 30.0–36.0)
MCV: 101.7 fL — ABNORMAL HIGH (ref 80.0–100.0)
Platelets: 247 10*3/uL (ref 150–400)
RBC: 4.22 MIL/uL (ref 3.87–5.11)
RDW: 13.7 % (ref 11.5–15.5)
WBC: 21.7 10*3/uL — ABNORMAL HIGH (ref 4.0–10.5)
nRBC: 0 % (ref 0.0–0.2)

## 2018-10-20 LAB — POCT I-STAT 7, (LYTES, BLD GAS, ICA,H+H)
Acid-base deficit: 2 mmol/L (ref 0.0–2.0)
Bicarbonate: 21.5 mmol/L (ref 20.0–28.0)
Calcium, Ion: 1.34 mmol/L (ref 1.15–1.40)
HCT: 32 % — ABNORMAL LOW (ref 36.0–46.0)
Hemoglobin: 10.9 g/dL — ABNORMAL LOW (ref 12.0–15.0)
O2 Saturation: 92 %
Patient temperature: 98.9
Potassium: 3.6 mmol/L (ref 3.5–5.1)
Sodium: 156 mmol/L — ABNORMAL HIGH (ref 135–145)
TCO2: 22 mmol/L (ref 22–32)
pCO2 arterial: 33.5 mmHg (ref 32.0–48.0)
pH, Arterial: 7.415 (ref 7.350–7.450)
pO2, Arterial: 64 mmHg — ABNORMAL LOW (ref 83.0–108.0)

## 2018-10-20 LAB — BASIC METABOLIC PANEL
Anion gap: 18 — ABNORMAL HIGH (ref 5–15)
Anion gap: 7 (ref 5–15)
Anion gap: 12 (ref 5–15)
Anion gap: 11 (ref 5–15)
Anion gap: 14 (ref 5–15)
BUN: 49 mg/dL — ABNORMAL HIGH (ref 6–20)
BUN: 47 mg/dL — ABNORMAL HIGH (ref 6–20)
BUN: 45 mg/dL — ABNORMAL HIGH (ref 6–20)
BUN: 48 mg/dL — ABNORMAL HIGH (ref 6–20)
BUN: 47 mg/dL — ABNORMAL HIGH (ref 6–20)
CO2: 14 mmol/L — ABNORMAL LOW (ref 22–32)
CO2: 21 mmol/L — ABNORMAL LOW (ref 22–32)
CO2: 18 mmol/L — ABNORMAL LOW (ref 22–32)
CO2: 20 mmol/L — ABNORMAL LOW (ref 22–32)
CO2: 17 mmol/L — ABNORMAL LOW (ref 22–32)
Calcium: 9.3 mg/dL (ref 8.9–10.3)
Calcium: 9.2 mg/dL (ref 8.9–10.3)
Calcium: 9.3 mg/dL (ref 8.9–10.3)
Calcium: 9.3 mg/dL (ref 8.9–10.3)
Calcium: 9.1 mg/dL (ref 8.9–10.3)
Chloride: 114 mmol/L — ABNORMAL HIGH (ref 98–111)
Chloride: 125 mmol/L — ABNORMAL HIGH (ref 98–111)
Chloride: 124 mmol/L — ABNORMAL HIGH (ref 98–111)
Chloride: 124 mmol/L — ABNORMAL HIGH (ref 98–111)
Chloride: 119 mmol/L — ABNORMAL HIGH (ref 98–111)
Creatinine, Ser: 2.14 mg/dL — ABNORMAL HIGH (ref 0.44–1.00)
Creatinine, Ser: 1.87 mg/dL — ABNORMAL HIGH (ref 0.44–1.00)
Creatinine, Ser: 1.87 mg/dL — ABNORMAL HIGH (ref 0.44–1.00)
Creatinine, Ser: 2.08 mg/dL — ABNORMAL HIGH (ref 0.44–1.00)
Creatinine, Ser: 2.1 mg/dL — ABNORMAL HIGH (ref 0.44–1.00)
GFR calc Af Amer: 30 mL/min — ABNORMAL LOW (ref >60)
GFR calc Af Amer: 35 mL/min — ABNORMAL LOW (ref >60)
GFR calc Af Amer: 35 mL/min — ABNORMAL LOW (ref >60)
GFR calc Af Amer: 31 mL/min — ABNORMAL LOW (ref >60)
GFR calc Af Amer: 31 mL/min — ABNORMAL LOW (ref >60)
GFR calc non Af Amer: 26 mL/min — ABNORMAL LOW (ref >60)
GFR calc non Af Amer: 30 mL/min — ABNORMAL LOW (ref >60)
GFR calc non Af Amer: 30 mL/min — ABNORMAL LOW (ref >60)
GFR calc non Af Amer: 27 mL/min — ABNORMAL LOW (ref >60)
GFR calc non Af Amer: 26 mL/min — ABNORMAL LOW (ref >60)
Glucose, Bld: 553 mg/dL (ref 70–99)
Glucose, Bld: 144 mg/dL — ABNORMAL HIGH (ref 70–99)
Glucose, Bld: 169 mg/dL — ABNORMAL HIGH (ref 70–99)
Glucose, Bld: 201 mg/dL — ABNORMAL HIGH (ref 70–99)
Glucose, Bld: 232 mg/dL — ABNORMAL HIGH (ref 70–99)
Potassium: 5 mmol/L (ref 3.5–5.1)
Potassium: 3.7 mmol/L (ref 3.5–5.1)
Potassium: 4.4 mmol/L (ref 3.5–5.1)
Potassium: 3.9 mmol/L (ref 3.5–5.1)
Potassium: 4.1 mmol/L (ref 3.5–5.1)
Sodium: 146 mmol/L — ABNORMAL HIGH (ref 135–145)
Sodium: 153 mmol/L — ABNORMAL HIGH (ref 135–145)
Sodium: 154 mmol/L — ABNORMAL HIGH (ref 135–145)
Sodium: 155 mmol/L — ABNORMAL HIGH (ref 135–145)
Sodium: 150 mmol/L — ABNORMAL HIGH (ref 135–145)

## 2018-10-20 LAB — GLUCOSE, CAPILLARY
Glucose-Capillary: 105 mg/dL — ABNORMAL HIGH (ref 70–99)
Glucose-Capillary: 124 mg/dL — ABNORMAL HIGH (ref 70–99)
Glucose-Capillary: 126 mg/dL — ABNORMAL HIGH (ref 70–99)
Glucose-Capillary: 138 mg/dL — ABNORMAL HIGH (ref 70–99)
Glucose-Capillary: 165 mg/dL — ABNORMAL HIGH (ref 70–99)
Glucose-Capillary: 176 mg/dL — ABNORMAL HIGH (ref 70–99)
Glucose-Capillary: 180 mg/dL — ABNORMAL HIGH (ref 70–99)
Glucose-Capillary: 192 mg/dL — ABNORMAL HIGH (ref 70–99)
Glucose-Capillary: 198 mg/dL — ABNORMAL HIGH (ref 70–99)
Glucose-Capillary: 219 mg/dL — ABNORMAL HIGH (ref 70–99)
Glucose-Capillary: 233 mg/dL — ABNORMAL HIGH (ref 70–99)
Glucose-Capillary: 254 mg/dL — ABNORMAL HIGH (ref 70–99)
Glucose-Capillary: 258 mg/dL — ABNORMAL HIGH (ref 70–99)
Glucose-Capillary: 302 mg/dL — ABNORMAL HIGH (ref 70–99)
Glucose-Capillary: 320 mg/dL — ABNORMAL HIGH (ref 70–99)
Glucose-Capillary: 399 mg/dL — ABNORMAL HIGH (ref 70–99)
Glucose-Capillary: 400 mg/dL — ABNORMAL HIGH (ref 70–99)
Glucose-Capillary: 446 mg/dL — ABNORMAL HIGH (ref 70–99)
Glucose-Capillary: 508 mg/dL (ref 70–99)
Glucose-Capillary: 84 mg/dL (ref 70–99)
Glucose-Capillary: 96 mg/dL (ref 70–99)
Glucose-Capillary: 96 mg/dL (ref 70–99)

## 2018-10-20 LAB — TRIGLYCERIDES: Triglycerides: 79 mg/dL (ref <150)

## 2018-10-20 LAB — PROTIME-INR
INR: 1.1 (ref 0.8–1.2)
Prothrombin Time: 13.6 seconds (ref 11.4–15.2)

## 2018-10-20 LAB — LACTIC ACID, PLASMA: Lactic Acid, Venous: 1.4 mmol/L (ref 0.5–1.9)

## 2018-10-20 MED ORDER — HEPARIN SODIUM (PORCINE) 5000 UNIT/ML IJ SOLN
5000.0000 [IU] | Freq: Three times a day (TID) | INTRAMUSCULAR | Status: DC
  Administered 2018-10-20 – 2018-11-11 (×47): 5000 [IU] via SUBCUTANEOUS
  Filled 2018-10-20 (×51): qty 1

## 2018-10-20 MED ORDER — SODIUM CHLORIDE 0.9 % IV SOLN
INTRAVENOUS | Status: DC
  Administered 2018-10-23 – 2018-10-24 (×2): via INTRAVENOUS

## 2018-10-20 MED ORDER — INSULIN REGULAR(HUMAN) IN NACL 100-0.9 UT/100ML-% IV SOLN
INTRAVENOUS | Status: DC
  Administered 2018-10-20: 05:00:00 3.4 [IU]/h via INTRAVENOUS

## 2018-10-20 MED ORDER — INSULIN REGULAR(HUMAN) IN NACL 100-0.9 UT/100ML-% IV SOLN
INTRAVENOUS | Status: DC
  Administered 2018-10-20: 3.5 [IU]/h via INTRAVENOUS
  Filled 2018-10-20: qty 100

## 2018-10-20 MED ORDER — DEXTROSE-NACL 5-0.45 % IV SOLN: INTRAVENOUS | Status: DC

## 2018-10-20 MED ORDER — PANTOPRAZOLE SODIUM 40 MG PO TBEC: 40.0000 mg | DELAYED_RELEASE_TABLET | Freq: Every day | ORAL | Status: DC

## 2018-10-20 MED ORDER — PANTOPRAZOLE SODIUM 40 MG IV SOLR: 40.0000 mg | Freq: Every day | INTRAVENOUS | Status: DC

## 2018-10-20 MED ORDER — DEXTROSE 50 % IV SOLN: 25.0000 mL | INTRAVENOUS | Status: DC | PRN

## 2018-10-20 MED ORDER — SODIUM CHLORIDE 0.9 % IV SOLN
INTRAVENOUS | Status: DC
  Administered 2018-10-20: 05:00:00 via INTRAVENOUS

## 2018-10-20 MED ORDER — DEXMEDETOMIDINE HCL IN NACL 200 MCG/50ML IV SOLN: 0.4000 ug/kg/h | INTRAVENOUS | Status: DC

## 2018-10-20 NOTE — Progress Notes (Addendum)
Inpatient Diabetes Program Recommendations  AACE/ADA: New Consensus Statement on Inpatient Glycemic Control (2015)  Target Ranges:  Prepandial:   less than 140 mg/dL      Peak postprandial:   less than 180 mg/dL (1-2 hours)      Critically ill patients:  140 - 180 mg/dL   Lab Results  Component Value Date   GLUCAP 302 (H) 10/20/2018   HGBA1C 5.7 (H) 06/13/2018    Review of Glycemic Control Results for TAVIE, HASEMAN (MRN [id number]) as of 10/20/2018 09:27  Ref. Range 10/20/2018 05:09 10/20/2018 06:24 10/20/2018 07:47 10/20/2018 08:43  Glucose-Capillary Latest Ref Range: 70 - 99 mg/dL 400 (H) 399 (H) 320 (H) 302 (H)   Diabetes history: Type 1 DM Outpatient Diabetes medications: Medronic insulin pump Current orders for Inpatient glycemic control: IV insulin  Inpatient Diabetes Program Recommendations:    Noted consult.   At 0356, BMET reflected CO2 of 14, and Anion gap 18. At this time, patient needs BMETs Q4H under the DKA protocol.  Noted insulin drip was discontinued on 4/4 @2259  without basal insulin administration prior to discontinuation. Per RN notes, patient was combative.  Currently, patient is restraints and not appropriate for insulin pump application or discussion. Would not recommend transition at this time. When ready to transition, patient is a type 1 DM and MUST received basal insulin 2 hours prior to discontinuation of IV insulin.  Of note, Patient planned to schedule appointment with Dr Buddy Duty and last saw Bev Paddock in 05/2018.   Reached out to dr Erlinda Hong @950 - orders received. Md to contact CCM.   Thanks, Bronson Curb, MSN, RNC-OB Diabetes Coordinator [phone] (8a-5p)

## 2018-10-20 NOTE — Progress Notes (Signed)
SLP Cancellation Note  Patient Details Name: Tina [name] MRN: [id number] DOB: February 10, 1966   Cancelled treatment:       Reason Eval/Treat Not Completed: Fatigue/lethargy limiting ability to participate. Pt's case was discussed with Marissa, RN and she indicated that the pt is no more alert than when speech pathology attempted to work with her yesterday. SLP session was therefore deferred but SLP will continue to follow pt.   Epic Tribbett I. Hardin Negus, [place], [place] Office number [phone] Pager [place] 10/20/2018, 11:15 AM

## 2018-10-20 NOTE — Progress Notes (Signed)
No change in status.  Patient remains confused.  Will follow commands with great prompting.  Follow-up head CT scan demonstrates no evidence of worsening hemorrhage.  No evidence of hydrocephalus.  Stable from my standpoint.  No indication for ventricular drainage.  Please call if any new problems.

## 2018-10-20 NOTE — Progress Notes (Addendum)
STROKE TEAM PROGRESS NOTE   SUBJECTIVE (INTERVAL HISTORY) Her nurse is at bedside.  Patient is lethargic and sleepy, but able to arouse with pain stimulation.  Able to have spontaneous speech with simple sentences, however not a follow commands or answer appropriately.  Severely elevated glucose level with decreased bicarb, concerning for DKA.  CCM consulted.   OBJECTIVE Vitals:   10/20/18 1145 10/20/18 1200 10/20/18 1215 10/20/18 1230  BP: 124/84 136/68 (!) 143/74 138/64  Pulse: (!) 104 (!) 103 (!) 107 (!) 106  Resp: (!) 23 (!) 22 19 (!) 21  Temp:  98.9 F (37.2 C)    TempSrc:  Axillary    SpO2: 95% 97% 98% 97%  Weight:      Height:        CBC:  Recent Labs  Lab 10/20/18 0356 10/20/18 1154  WBC 21.7*  --   HGB 12.8 10.9*  HCT 42.9 32.0*  MCV 101.7*  --   PLT 247  --     Basic Metabolic Panel:  Recent Labs  Lab 10/18/18 1050  10/20/18 0356 10/20/18 1112 10/20/18 1154  NA 143   < > 146* 153* 156*  K 4.3   < > 5.0 3.7 3.6  CL 109  --  114* 125*  --   CO2 23  --  14* 21*  --   GLUCOSE 180*  --  553* 144*  --   BUN 24*  --  49* 47*  --   CREATININE 1.36*  --  2.14* 1.87*  --   CALCIUM 9.2  --  9.3 9.2  --   MG 2.3  --   --   --   --   PHOS 4.4  --   --   --   --    < > = values in this interval not displayed.    Lipid Panel:     Component Value Date/Time   CHOL 273 (H) 10/06/2009 2131   TRIG 79 10/20/2018 0356   HDL 93 10/06/2009 2131   CHOLHDL 2.9 Ratio 10/06/2009 2131   VLDL 17 10/06/2009 2131   LDLCALC 163 (H) 10/06/2009 2131   HgbA1c:  Lab Results  Component Value Date   HGBA1C 5.7 (H) 06/13/2018   Urine Drug Screen: No results found for: LABOPIA, COCAINSCRNUR, LABBENZ, AMPHETMU, THCU, LABBARB  Alcohol Level     Component Value Date/Time   ETH <10 10/18/2018 1050    IMAGING  Ct Head Wo Contrast 10/18/2018 IMPRESSION:  Right thalamic hemorrhage with intraventricular extension. Most of the blood is located in the left lateral ventricle.     Mr Brain Wo Contrast 10/18/2018 IMPRESSION:  Severely motion degraded examination showing unchanged size of intraparenchymal hematoma centered in the right thalamus with intraventricular extension.    CT Angiogram Head  10/19/2018 IMPRESSION: 1. Size stable right thalamic and intraventricular clot with dilatation of the left temporal horn. 2. Tiny spot sign associated with the right thalamic hematoma, of questionable significance given 24 hour stability of intracranial hemorrhage. No underlying vascular malformation or aneurysm. 3. Small remote left thalamic and left cerebellar infarcts.   Ct Head Wo Contrast 10/20/2018 CLINICAL DATA:  Intracranial hemorrhage follow up EXAM: CT HEAD WITHOUT CONTRAST TECHNIQUE: Contiguous axial images were obtained from the base of the skull through the vertex without intravenous contrast. COMPARISON:  10/18/2018 FINDINGS: Brain: Unchanged size of intraparenchymal hematoma centered in the right thalamus. Intraventricular blood predominantly located in the left lateral ventricle is unchanged in volume. There is no  new site of hemorrhage. No acute parenchymal abnormality. The size of the left lateral ventricle is unchanged. Vascular: No abnormal hyperdensity of the major intracranial arteries or dural venous sinuses. No intracranial atherosclerosis. Skull: The visualized skull base, calvarium and extracranial soft tissues are normal. Sinuses/Orbits: No fluid levels or advanced mucosal thickening of the visualized paranasal sinuses. No mastoid or middle ear effusion. The orbits are normal. IMPRESSION: 1. Unchanged size of intraparenchymal hematoma centered in the right thalamus. 2. Unchanged volume of intraventricular blood predominantly in the left lateral ventricle. 3. No new site of hemorrhage. Electronically Signed   By: Ulyses Jarred M.D.   On: 10/20/2018 05:47    Transthoracic Echocardiogram  06/12/2018 Study Conclusions - Left ventricle: The cavity size was  normal. Systolic function was   normal. The estimated ejection fraction was in the range of 60%   to 65%. Wall motion was normal; there were no regional wall   motion abnormalities. Abnormal relaxation with increased filling   pressures. - Aortic valve: There was no regurgitation. - Aortic root: The aortic root was normal in size. - Mitral valve: There was trivial regurgitation. - Right ventricle: Systolic function was normal. - Atrial septum: No defect or patent foramen ovale was identified. - Tricuspid valve: There was no significant regurgitation. - Inferior vena cava: The vessel was normal in size. The   respirophasic diameter changes were in the normal range (>= 50%),   consistent with normal central venous pressure. - Pericardium, extracardiac: A trivial pericardial effusion was   identified. Impressions: - Normal LV EF, no significant valvular abnormalities.   EKG - SR rate 92 BPM. (See cardiology reading for complete details)   PHYSICAL EXAM Blood pressure 138/64, pulse (!) 106, temperature 98.9 F (37.2 C), temperature source Axillary, resp. rate (!) 21, height 4\' 9"  (1.448 m), weight 65.7 kg, SpO2 97 %.   Temp:  [98.3 F (36.8 C)-99.5 F (37.5 C)] 99.3 F (37.4 C) (04/05 1600) Pulse Rate:  [101-116] 106 (04/05 1230) Resp:  [16-37] 21 (04/05 1230) BP: (92-161)/(45-132) 138/64 (04/05 1230) SpO2:  [93 %-100 %] 97 % (04/05 1230) FiO2 (%):  [98 %-100 %] 98 % (04/05 1200)  General - obese, well developed, lethargic and sleepy.  Ophthalmologic - fundi not visualized due to noncooperation.  Cardiovascular - Regular rate and rhythm, not in A. fib.  Neuro - Patient is lethargic and sleepy, agitated with pain stimulation and not following commands, not answering any questions properly however she is verbalizing "I am fine, I am OK", right ocular prosthesis, no blinking to threat in the left eye, no spontaneous eye movement, left mild proptosis, no significant facial  asymmetry, tongue midline in mouth.  Moving all extremities strongly on pain stimulation except right upper extremity 3/5 with pain.  DTR not corporative, Sensation, coordination and gait not tested.   ASSESSMENT/PLAN Tina [name] is a 53 y.o. female with history of type I diabetes, hypertension, paroxysmal A. fib on Coumadin, Turner syndrome and legal blindness presenting with AMS, mildly hypertensive and combative. She did not receive IV t-PA due to hemorrhage.  Right thalamic hemorrhage with intraventricular extension, likely hypertensive in the setting of Coumadin use  CT head - Right thalamic hemorrhage with left IVH.   MRI head - unchanged ICH centered in the right thalamus with intraventricular extension.   CTA Head - Size stable right thalamic and intraventricular clot with dilatation of the left temporal horn. Small remote left thalamic and left cerebellar infarcts.  No aneurysm, or  AVM  2D Echo - 06/12/2018 - EF 60 - 65%.   LDL - 163  HgbA1c - 5.7  UDS - pending  VTE prophylaxis - Heparin subcu  Diet - NPO  warfarin daily prior to admission, INR 2.6, reversed with vitamin K and Kcentra.  Now on No antithrombotic   NSG consulted, no intervention needed at this time  Ongoing aggressive stroke risk factor management  Therapy recommendations:  SNF  Disposition:  Pending  DKA with type 1 diabetes  HgbA1c 5.7, goal < 7.0  On insulin IV  This a.m. glucose 553  DM coordinator on board  CCM consulted  Continue insulin IV and IV fluid  Close monitoring  PAF  Currently in NSR  On Coumadin at home  INR 2.6 on admission, therapeutic -> reversed with Kcentra and vitamin K  Hypertension  Stable   On Cleviprex . SBP goal < 160 mm HG . Long-term BP goal normotensive   Hyperlipidemia  Lipid lowering medication PTA: Crestor 20 mg daily  LDL 163, goal < 70  Current lipid lowering medication: none - would increase to 40 mg daily at time of  discharge  Continue statin at discharge  Other Stroke Risk Factors  Former cigarette smoker - quit 30 years ago  ETOH use, advised to drink no more than 1 alcoholic beverage per day.  Obesity, Body mass index is 31.34 kg/m., recommend weight loss, diet and exercise as appropriate   Previous strokes by imaging - Small remote left thalamic and left cerebellar infarcts.  Baseline OSA - no BiPAP at this time given agitation as per CCM  Other Active Problems  INR - 2.6 on admission - reversed -> 1.1  Anemia of chronic disease- 12.9->10.9  Leukocytosis - 21.7 (afebrile)  AKI - creatinine - 1.36 ->2.14->1.87  Na - 156-> 154 - continue IVF  Hospital day # 2  This patient is critically ill due to [place], IVH, DKA, PAF and at significant risk of neurological worsening, death form recurrent bleeding, hydrocephalus, heart failure, DKA, seizure. This patient's care requires constant monitoring of vital signs, hemodynamics, respiratory and cardiac monitoring, review of multiple databases, neurological assessment, discussion with family, other specialists and medical decision making of high complexity. I spent 40 minutes of neurocritical care time in the care of this patient.  [name], MD PhD Stroke Neurology 10/20/2018 4:37 PM     To contact Stroke Continuity provider, please refer to [email or web address] After hours, contact General Neurology

## 2018-10-20 NOTE — Progress Notes (Signed)
Pt CBG now >500 without PO or IV intake. Order to switch back to IV insulin. New orders in [facility].

## 2018-10-20 NOTE — Consult Note (Signed)
NAME:  Tina [name], MRN:  [id number], DOB:  1966-06-06, LOS: 2 ADMISSION DATE:  10/18/2018, CONSULTATION DATE:  10/20/2018 REFERRING MD:  Dr Erlinda Hong - stroke svc, CHIEF COMPLAINT:  DKA   Brief History   See hpi  History of present illness   53 year old obese female with hx of OSA on CPAP at hoe and typle 1 DM. Admitted 10/18/2018 for Intracranial bleed . Course complicated by agitation and compounded by nPO status, develolping DKA and inability  to do biPap / cpap and also gettting benzo.opioid for procedures such as imaging brain. PCCM called as she is on 6L [place] - 98% (baseline -  ? On o2) And also DKA  Past Medical History     has a past medical history of Arthritis, Cataract, Diabetic retinopathy ([place]), Diverticulosis, DM (diabetes mellitus), type 1 ([place]), Dyspnea, Elevated LFTs, Hyperlipidemia, Hypertension, Internal hemorrhoids, Legally blind in right eye, as defined in [place], Osteoporosis (11/2017), Retinal detachment, Tachycardia, Tubular adenoma of colon, Turner's syndrome, and Visual loss, bilateral.   reports that she quit smoking about 30 years ago. Her smoking use included cigarettes. She has a 0.10 pack-year smoking history. She has never used smokeless tobacco.  Past Surgical History:  Procedure Laterality Date   CATARACT EXTRACTION     CHOLECYSTECTOMY     Hepatic adenoma removed     RETINAL DETACHMENT SURGERY     right eye surgery      3 years ago   TONSILLECTOMY      Allergies  Allergen Reactions   Acetazolamide Anaphylaxis and Other (See Comments)     Reaction, drop in Blood pressure that caused patient to stay in bed for 5 days, inconherient  Reaction, drop in Blood pressure that caused patient to stay in bed for 5 days, inconherient   Ace Inhibitors Cough     There is no immunization history on file for this patient.  Family History  Problem Relation Age of Onset   Cancer Father        kidney   Diabetes Maternal Grandmother    Emphysema Paternal  Grandfather    Esophageal cancer Paternal Aunt    Colon cancer Neg Hx    Colon polyps Neg Hx    Rectal cancer Neg Hx    Stomach cancer Neg Hx      Current Facility-Administered Medications:    0.9 %  sodium chloride infusion, , Intravenous, Continuous, [name], MD, Last Rate: 25 mL/hr at 10/20/18 0519   0.9 %  sodium chloride infusion, , Intravenous, Continuous, [name], MD   acetaminophen (TYLENOL) tablet 650 mg, 650 mg, Oral, Q4H PRN **OR** acetaminophen (TYLENOL) solution 650 mg, 650 mg, Per Tube, Q4H PRN **OR** acetaminophen (TYLENOL) suppository 650 mg, 650 mg, Rectal, Q4H PRN, Lorella Nimrod, MD   chlorhexidine (PERIDEX) 0.12 % solution 15 mL, 15 mL, Mouth Rinse, BID, Sarina Ill B, MD, 15 mL at 10/20/18 1051   [DISCONTINUED] labetalol (NORMODYNE,TRANDATE) injection 20 mg, 20 mg, Intravenous, Once **AND** clevidipine (CLEVIPREX) infusion 0.5 mg/mL, 0-21 mg/hr, Intravenous, Continuous, Amin, Soundra Pilon, MD, Stopped at 10/20/18 1404   dextrose 5 %-0.45 % sodium chloride infusion, , Intravenous, Continuous, [name], MD   diltiazem (CARDIZEM CD) 24 hr capsule 240 mg, 240 mg, Oral, Daily, Amin, Sumayya, MD   insulin regular, human (MYXREDLIN) 100 units/ 100 mL infusion, , Intravenous, Continuous, [name], MD, Stopped at 10/20/18 1457   losartan (COZAAR) tablet 100 mg, 100 mg, Oral, Daily, Lorella Nimrod, MD   MEDLINE mouth  rinse, 15 mL, Mouth Rinse, q12n4p, Sarina Ill B, MD, 15 mL at 10/20/18 1155   pantoprazole (PROTONIX) injection 40 mg, 40 mg, Intravenous, QHS, [name], MD   senna-docusate (Senokot-S) tablet 1 tablet, 1 tablet, Oral, BID, Lorella Nimrod, MD   Significant Hospital Events   10/18/2018= admit 4/5 - DKA ccm consult  Consults:  4/.5 - ccm consult  Procedures:  x  Significant Diagnostic Tests:  x  Micro Data:  x  Antimicrobials:   Anti-infectives (From admission, onward)   None        Interim history/subjective:    4/5 - ccm consult for dka  Objective   Blood pressure 138/64, pulse (!) 106, temperature 98.9 F (37.2 C), temperature source Axillary, resp. rate (!) 21, height 4\' 9"  (1.448 m), weight 65.7 kg, SpO2 97 %.    FiO2 (%):  [98 %-100 %] 98 %   Intake/Output Summary (Last 24 hours) at 10/20/2018 1459 Last data filed at 10/20/2018 1200 Gross per 24 hour  Intake 1065.61 ml  Output 550 ml  Net 515.61 ml   Filed Weights   10/18/18 1104 10/18/18 1700  Weight: 68.5 kg 65.7 kg    Examination: General: short, obese lady, on o2 HENT: short neck, on o2 Lungs: Clear no distres Cardiovascular: RRR +. Normal heart sounds Abdomen: obese, soft Extremities: intact Neuro: awake but disoriented GU: not examined    LABS    PULMONARY Recent Labs  Lab 10/18/18 1112 10/20/18 1154  PHART  --  7.415  PCO2ART  --  33.5  PO2ART  --  64.0*  HCO3 24.6 21.5  TCO2 26 22  O2SAT 70.0 92.0    CBC Recent Labs  Lab 10/18/18 1112 10/20/18 0356 10/20/18 1154  HGB 12.9 12.8 10.9*  HCT 38.0 42.9 32.0*  WBC  --  21.7*  --   PLT  --  247  --     COAGULATION Recent Labs  Lab 10/18/18 1050 10/18/18 1413 10/18/18 2213 10/19/18 0246 10/20/18 0356  INR 2.6* 1.0 1.2 1.1 1.1    CARDIAC   Recent Labs  Lab 10/18/18 1050  TROPONINI 0.03*   No results for input(s): PROBNP in the last 168 hours.   CHEMISTRY Recent Labs  Lab 10/18/18 1050 10/18/18 1112 10/20/18 0356 10/20/18 1112 10/20/18 1154  NA 143 141 146* 153* 156*  K 4.3 4.6 5.0 3.7 3.6  CL 109  --  114* 125*  --   CO2 23  --  14* 21*  --   GLUCOSE 180*  --  553* 144*  --   BUN 24*  --  49* 47*  --   CREATININE 1.36*  --  2.14* 1.87*  --   CALCIUM 9.2  --  9.3 9.2  --   MG 2.3  --   --   --   --   PHOS 4.4  --   --   --   --    Estimated Creatinine Clearance: 27.4 mL/min (A) (by C-G formula based on SCr of 1.87 mg/dL (H)).   LIVER Recent Labs  Lab 10/18/18 1050 10/18/18 1413 10/18/18 2213 10/19/18 0246  10/20/18 0356  AST 42*  --   --   --   --   ALT 69*  --   --   --   --   ALKPHOS 126  --   --   --   --   BILITOT 1.1  --   --   --   --  PROT 6.4*  --   --   --   --   ALBUMIN 3.2*  --   --   --   --   INR 2.6* 1.0 1.2 1.1 1.1     INFECTIOUS Recent Labs  Lab 10/18/18 1050  LATICACIDVEN 1.4     ENDOCRINE CBG (last 3)  Recent Labs    10/20/18 1151 10/20/18 1251 10/20/18 1352  GLUCAP 124* 96 96         IMAGING x48h  - image(s) personally visualized  -   highlighted in bold Ct Angio Head W Or Wo Contrast  Result Date: 10/19/2018 CLINICAL DATA:  Altered mental status and hemorrhage EXAM: CT ANGIOGRAPHY HEAD TECHNIQUE: Multidetector CT imaging of the head was performed using the standard protocol during bolus administration of intravenous contrast. Multiplanar CT image reconstructions and MIPs were obtained to evaluate the vascular anatomy. CONTRAST:  53mL OMNIPAQUE IOHEXOL 350 MG/ML SOLN COMPARISON:  Head CT from yesterday FINDINGS: CT HEAD Brain: 17-18 mm acute hematoma in the right thalamus, unchanged when measured in a similar fashion. There is intraventricular clot in the left more than right lateral ventricles with left temporal horn dilatation. Small remote left cerebellar infarct superiorly on the left. No acute infarct by preceding MRI. Vascular: See below Skull: Negative Sinuses: Negative Orbits: Bilateral cataract resection with oil tamponade and scleral band on the right where there is a prosthesis. CTA HEAD Anterior circulation: No branch occlusion or flow limiting stenosis. Hypoplastic right A1 segment. Calcified plaque on the right carotid siphon and mild left M1 stenosis. Negative for aneurysm Posterior circulation: The vertebral and basilar arteries are smooth and widely patent. No branch occlusion, flow limiting stenosis, or significant stenosis. There is a tiny high-density focus anteriorly at the right thalamic hematoma without adjacent vascular malformation.  Venous sinuses: Patent Anatomic variants: As above Delayed phase: No abnormal intracranial enhancement. Remote lacunar infarct in the left thalamus. IMPRESSION: 1. Size stable right thalamic and intraventricular clot with dilatation of the left temporal horn. 2. Tiny spot sign associated with the right thalamic hematoma, of questionable significance given 24 hour stability of intracranial hemorrhage. No underlying vascular malformation or aneurysm. 3. Small remote left thalamic and left cerebellar infarcts. Electronically Signed   By: Monte Fantasia M.D.   On: 10/19/2018 10:55   Ct Head Wo Contrast  Result Date: 10/20/2018 CLINICAL DATA:  Intracranial hemorrhage follow up EXAM: CT HEAD WITHOUT CONTRAST TECHNIQUE: Contiguous axial images were obtained from the base of the skull through the vertex without intravenous contrast. COMPARISON:  10/18/2018 FINDINGS: Brain: Unchanged size of intraparenchymal hematoma centered in the right thalamus. Intraventricular blood predominantly located in the left lateral ventricle is unchanged in volume. There is no new site of hemorrhage. No acute parenchymal abnormality. The size of the left lateral ventricle is unchanged. Vascular: No abnormal hyperdensity of the major intracranial arteries or dural venous sinuses. No intracranial atherosclerosis. Skull: The visualized skull base, calvarium and extracranial soft tissues are normal. Sinuses/Orbits: No fluid levels or advanced mucosal thickening of the visualized paranasal sinuses. No mastoid or middle ear effusion. The orbits are normal. IMPRESSION: 1. Unchanged size of intraparenchymal hematoma centered in the right thalamus. 2. Unchanged volume of intraventricular blood predominantly in the left lateral ventricle. 3. No new site of hemorrhage. Electronically Signed   By: Ulyses Jarred M.D.   On: 10/20/2018 05:47   Mr Brain Wo Contrast  Result Date: 10/18/2018 CLINICAL DATA:  Intracranial hemorrhage EXAM: MRI HEAD WITHOUT  CONTRAST TECHNIQUE:  Multiplanar, multiecho pulse sequences of the brain and surrounding structures were obtained without intravenous contrast. COMPARISON:  10/18/2018 head CT FINDINGS: Examination is severely degraded by motion. Intraparenchymal hematoma centered in the right thalamus with predominantly left-sided intraventricular extension is unchanged. There is edema surrounding the right thalamic hemorrhage site and surrounding the atrium and temporal horn of the left lateral ventricle. No acute ischemia is identified. IMPRESSION: Severely motion degraded examination showing unchanged size of intraparenchymal hematoma centered in the right thalamus with intraventricular extension. Electronically Signed   By: Ulyses Jarred M.D.   On: 10/18/2018 22:13   Dg Chest Port 1 View  Result Date: 10/18/2018 CLINICAL DATA:  Intra cerebral hemorrhage. EXAM: PORTABLE CHEST 1 VIEW COMPARISON:  06/11/2018 FINDINGS: The heart size and mediastinal contours are within normal limits. Both lungs are clear. The visualized skeletal structures are unremarkable. IMPRESSION: No active disease. Electronically Signed   By: Nelson Chimes M.D.   On: 10/18/2018 18:50    Resolved Hospital Problem list   x  Assessment & Plan:  DKA  - do DKA protocol  Acute encephalopathy - multifactorial  - correct DKA and assess residue - avoid over-oxygenation - start precedex - avpoid benzo/opioids to extent possible  Acute  resp failure with baseline osa - =cannot do bipap while agitated - pulse ox goal > 88% - get cxr     Best practice:  Diet: NPO Pain/Anxiety/Delirium protocol (if indicated): precedex VAP protocol (if indicated): x DVT prophylaxis: scd GI prophylaxis: x Glucose control: DKA Mobility: bed rest Code Status: full code Family Communication: none at [place] Disposition: ICU   ATTESTATION & SIGNATURE   The patient Tina [name] is critically ill with multiple organ systems failure and requires high  complexity decision making for assessment and support, frequent evaluation and titration of therapies, application of advanced monitoring technologies and extensive interpretation of multiple databases.   Critical Care Time devoted to patient care services described in this note is  30  Minutes. This time reflects time of care of this signee Dr Brand Males. This critical care time does not reflect procedure time, or teaching time or supervisory time of PA/NP/Med student/Med Resident etc but could involve care discussion time     Dr. Brand Males, M.D., [facility]C.P Pulmonary and Critical Care Medicine Staff Physician [place] [facility] Pager: [phone], If no answer or between  15:00h - 7:00h: call [phone]  10/20/2018 2:59 PM

## 2018-10-21 DIAGNOSIS — D72829 Elevated white blood cell count, unspecified: Secondary | ICD-10-CM

## 2018-10-21 DIAGNOSIS — I480 Paroxysmal atrial fibrillation: Secondary | ICD-10-CM | POA: Diagnosis not present

## 2018-10-21 DIAGNOSIS — N179 Acute kidney failure, unspecified: Secondary | ICD-10-CM | POA: Diagnosis not present

## 2018-10-21 DIAGNOSIS — E785 Hyperlipidemia, unspecified: Secondary | ICD-10-CM | POA: Diagnosis not present

## 2018-10-21 DIAGNOSIS — I629 Nontraumatic intracranial hemorrhage, unspecified: Secondary | ICD-10-CM

## 2018-10-21 DIAGNOSIS — E870 Hyperosmolality and hypernatremia: Secondary | ICD-10-CM

## 2018-10-21 DIAGNOSIS — I10 Essential (primary) hypertension: Secondary | ICD-10-CM | POA: Diagnosis not present

## 2018-10-21 DIAGNOSIS — I619 Nontraumatic intracerebral hemorrhage, unspecified: Secondary | ICD-10-CM | POA: Diagnosis not present

## 2018-10-21 LAB — BASIC METABOLIC PANEL
Anion gap: 15 (ref 5–15)
BUN: 48 mg/dL — ABNORMAL HIGH (ref 6–20)
CO2: 16 mmol/L — ABNORMAL LOW (ref 22–32)
Calcium: 9.3 mg/dL (ref 8.9–10.3)
Chloride: 123 mmol/L — ABNORMAL HIGH (ref 98–111)
Creatinine, Ser: 2.11 mg/dL — ABNORMAL HIGH (ref 0.44–1.00)
GFR calc Af Amer: 30 mL/min — ABNORMAL LOW (ref >60)
GFR calc non Af Amer: 26 mL/min — ABNORMAL LOW (ref >60)
Glucose, Bld: 266 mg/dL — ABNORMAL HIGH (ref 70–99)
Potassium: 4.2 mmol/L (ref 3.5–5.1)
Sodium: 154 mmol/L — ABNORMAL HIGH (ref 135–145)

## 2018-10-21 LAB — URINALYSIS, ROUTINE W REFLEX MICROSCOPIC
Bilirubin Urine: NEGATIVE
Glucose, UA: 150 mg/dL — AB
Ketones, ur: 20 mg/dL — AB
Nitrite: NEGATIVE
Protein, ur: 100 mg/dL — AB
Specific Gravity, Urine: 1.02 (ref 1.005–1.030)
pH: 5 (ref 5.0–8.0)

## 2018-10-21 LAB — PROTIME-INR
INR: 1.1 (ref 0.8–1.2)
Prothrombin Time: 14.2 seconds (ref 11.4–15.2)

## 2018-10-21 LAB — CULTURE, BLOOD (ROUTINE X 2): Special Requests: ADEQUATE

## 2018-10-21 LAB — GLUCOSE, CAPILLARY
Glucose-Capillary: 102 mg/dL — ABNORMAL HIGH (ref 70–99)
Glucose-Capillary: 111 mg/dL — ABNORMAL HIGH (ref 70–99)
Glucose-Capillary: 127 mg/dL — ABNORMAL HIGH (ref 70–99)
Glucose-Capillary: 147 mg/dL — ABNORMAL HIGH (ref 70–99)
Glucose-Capillary: 147 mg/dL — ABNORMAL HIGH (ref 70–99)
Glucose-Capillary: 184 mg/dL — ABNORMAL HIGH (ref 70–99)
Glucose-Capillary: 187 mg/dL — ABNORMAL HIGH (ref 70–99)
Glucose-Capillary: 190 mg/dL — ABNORMAL HIGH (ref 70–99)
Glucose-Capillary: 202 mg/dL — ABNORMAL HIGH (ref 70–99)
Glucose-Capillary: 206 mg/dL — ABNORMAL HIGH (ref 70–99)
Glucose-Capillary: 238 mg/dL — ABNORMAL HIGH (ref 70–99)
Glucose-Capillary: 89 mg/dL (ref 70–99)
Glucose-Capillary: 96 mg/dL (ref 70–99)

## 2018-10-21 LAB — CBC
HCT: 37.1 % (ref 36.0–46.0)
Hemoglobin: 11.9 g/dL — ABNORMAL LOW (ref 12.0–15.0)
MCH: 30.6 pg (ref 26.0–34.0)
MCHC: 32.1 g/dL (ref 30.0–36.0)
MCV: 95.4 fL (ref 80.0–100.0)
Platelets: 249 10*3/uL (ref 150–400)
RBC: 3.89 MIL/uL (ref 3.87–5.11)
RDW: 14.5 % (ref 11.5–15.5)
WBC: 20 10*3/uL — ABNORMAL HIGH (ref 4.0–10.5)
nRBC: 0 % (ref 0.0–0.2)

## 2018-10-21 LAB — HEPATIC FUNCTION PANEL
ALT: 70 U/L — ABNORMAL HIGH (ref 0–44)
AST: 61 U/L — ABNORMAL HIGH (ref 15–41)
Albumin: 3.1 g/dL — ABNORMAL LOW (ref 3.5–5.0)
Alkaline Phosphatase: 99 U/L (ref 38–126)
Bilirubin, Direct: 0.2 mg/dL (ref 0.0–0.2)
Indirect Bilirubin: 1.3 mg/dL — ABNORMAL HIGH (ref 0.3–0.9)
Total Bilirubin: 1.5 mg/dL — ABNORMAL HIGH (ref 0.3–1.2)
Total Protein: 6.1 g/dL — ABNORMAL LOW (ref 6.5–8.1)

## 2018-10-21 LAB — RAPID URINE DRUG SCREEN, HOSP PERFORMED
Amphetamines: NOT DETECTED
Barbiturates: NOT DETECTED
Benzodiazepines: POSITIVE — AB
Cocaine: NOT DETECTED
Opiates: NOT DETECTED
Tetrahydrocannabinol: NOT DETECTED

## 2018-10-21 LAB — MAGNESIUM: Magnesium: 2.4 mg/dL (ref 1.7–2.4)

## 2018-10-21 LAB — PHOSPHORUS: Phosphorus: 3.9 mg/dL (ref 2.5–4.6)

## 2018-10-21 MED ORDER — INSULIN ASPART 100 UNIT/ML ~~LOC~~ SOLN
0.0000 [IU] | Freq: Three times a day (TID) | SUBCUTANEOUS | Status: DC
  Administered 2018-10-21: 16:00:00 2 [IU] via SUBCUTANEOUS
  Administered 2018-10-22: 16:00:00 3 [IU] via SUBCUTANEOUS
  Administered 2018-10-22 (×2): 11 [IU] via SUBCUTANEOUS
  Administered 2018-10-23: 5 [IU] via SUBCUTANEOUS
  Administered 2018-10-23: 12:00:00 3 [IU] via SUBCUTANEOUS

## 2018-10-21 MED ORDER — LOSARTAN POTASSIUM 50 MG PO TABS
50.0000 mg | ORAL_TABLET | Freq: Two times a day (BID) | ORAL | Status: DC
  Administered 2018-10-22 – 2018-11-11 (×26): 50 mg via ORAL
  Filled 2018-10-21 (×34): qty 1

## 2018-10-21 MED ORDER — INSULIN ASPART 100 UNIT/ML ~~LOC~~ SOLN: 0.0000 [IU] | Freq: Every day | SUBCUTANEOUS | Status: DC

## 2018-10-21 MED ORDER — PANTOPRAZOLE SODIUM 40 MG PO TBEC
40.0000 mg | DELAYED_RELEASE_TABLET | Freq: Every day | ORAL | Status: DC
  Administered 2018-10-21 – 2018-10-22 (×2): 40 mg via ORAL
  Filled 2018-10-21 (×2): qty 1

## 2018-10-21 MED ORDER — METOPROLOL TARTRATE 25 MG PO TABS
25.0000 mg | ORAL_TABLET | Freq: Two times a day (BID) | ORAL | Status: DC
  Administered 2018-10-21 – 2018-10-23 (×5): 25 mg via ORAL
  Filled 2018-10-21 (×6): qty 1

## 2018-10-21 MED ORDER — INSULIN GLARGINE 100 UNIT/ML ~~LOC~~ SOLN
18.0000 [IU] | Freq: Every day | SUBCUTANEOUS | Status: DC
  Administered 2018-10-21 – 2018-10-22 (×2): 18 [IU] via SUBCUTANEOUS
  Filled 2018-10-21 (×3): qty 0.18

## 2018-10-21 NOTE — NC FL2 (Signed)
[place] MEDICAID FL2 LEVEL OF CARE SCREENING TOOL     IDENTIFICATION  Patient Name: Tina [name] Birthdate: 05-Sep-1965 Sex: female Admission Date (Current Location): 10/18/2018  [facility] and [place] Number:  Herbalist and Address:  [facility], [place] [address] [place], [place] [address]      Provider Number: [id number]  Attending Physician Name and Address:  [name], MD  Relative Name and Phone Number:  [name] (spouse) [phone]    Current Level of Care: Hospital Recommended Level of Care: [place] Prior Approval Number:    Date Approved/Denied:   PASRR Number: [id number]  Discharge Plan: SNF    Current Diagnoses: Patient Active Problem List   Diagnosis Date Noted  . Intracerebral hemorrhage 10/20/2018  . Intracranial hemorrhage ([place]) 10/18/2018  . Encounter for therapeutic drug monitoring 06/19/2018  . Atrial fibrillation ([place]) 06/19/2018  . Atrial fibrillation with RVR ([place]) 06/11/2018  . IDDM (insulin dependent diabetes mellitus) ([place]) 11/17/2013  . Hypersomnolence 09/18/2013  . Chronic cough 09/18/2013  . Abnormal transaminases 06/04/2013  . Abnormal alkaline phosphatase test 06/04/2013  . PAC (premature atrial contraction) 09/14/2011  . Reflux 08/26/2011  . Hyperglycemia 08/24/2011  . History of retinal detachment 08/24/2011  . Vitreous hemorrhage ([place]) 08/24/2011  . UTI (lower urinary tract infection) 08/24/2011  . HTN (hypertension) 08/24/2011  . Palpitations 10/25/2010    Orientation RESPIRATION BLADDER Height & Weight     (disoriented X4)  O2, Other (Comment)(HFNC 5-6L , CPAP (from home) ) Incontinent, External catheter Weight: 144 lb 13.5 oz (65.7 kg) Height:  4\' 9"  (144.8 cm)  BEHAVIORAL SYMPTOMS/MOOD NEUROLOGICAL BOWEL NUTRITION STATUS      Incontinent Diet(puree diet, thin liquids via straw when patient is awake and upright position, medications crushed with puree and full  supervision)  AMBULATORY STATUS COMMUNICATION OF NEEDS Skin   Extensive Assist Verbally Normal                       Personal Care Assistance Level of Assistance  Bathing, Feeding, Dressing, Total care Bathing Assistance: Maximum assistance Feeding assistance: Maximum assistance Dressing Assistance: Maximum assistance Total Care Assistance: Maximum assistance   Functional Limitations Info  Sight, Hearing, Speech Sight Info: Impaired(legally blind in right eye) Hearing Info: Adequate Speech Info: Impaired(slurred)    SPECIAL CARE FACTORS FREQUENCY  PT (By licensed PT), OT (By licensed OT)     PT Frequency: min 5x weekly OT Frequency: min 5x weekly            Contractures Contractures Info: Not present    Additional Factors Info  Code Status, Allergies Code Status Info: full Allergies Info: Acetazolamide, Ace Inhibitors           Current Medications (10/21/2018):  This is the current hospital active medication list Current Facility-Administered Medications  Medication Dose Route Frequency Provider Last Rate Last Dose  . 0.9 %  sodium chloride infusion   Intravenous Continuous [name], MD 50 mL/hr at 10/21/18 1000    . 0.9 %  sodium chloride infusion   Intravenous Continuous [name], MD      . acetaminophen (TYLENOL) tablet 650 mg  650 mg Oral Q4H PRN Lorella Nimrod, MD       Or  . acetaminophen (TYLENOL) solution 650 mg  650 mg Per Tube Q4H PRN Lorella Nimrod, MD       Or  . acetaminophen (TYLENOL) suppository 650 mg  650 mg Rectal Q4H PRN Lorella Nimrod,  MD      . chlorhexidine (PERIDEX) 0.12 % solution 15 mL  15 mL Mouth Rinse BID Sarina Ill B, MD   15 mL at 10/21/18 1049  . clevidipine (CLEVIPREX) infusion 0.5 mg/mL  0-21 mg/hr Intravenous Continuous Lorella Nimrod, MD 8 mL/hr at 10/21/18 1000 4 mg/hr at 10/21/18 1000  . dexmedetomidine (PRECEDEX) 200 MCG/50ML (4 mcg/mL) infusion  0.4-1.2 mcg/kg/hr Intravenous Titrated Brand Males, MD      .  dextrose 5 %-0.45 % sodium chloride infusion   Intravenous Continuous [name], MD      . diltiazem (CARDIZEM CD) 24 hr capsule 240 mg  240 mg Oral Daily Lorella Nimrod, MD   240 mg at 10/21/18 1049  . heparin injection 5,000 Units  5,000 Units Subcutaneous Q8H [name], MD   5,000 Units at 10/21/18 0547  . insulin aspart (novoLOG) injection 0-15 Units  0-15 Units Subcutaneous TID WC Olalere, Adewale A, MD      . insulin aspart (novoLOG) injection 0-5 Units  0-5 Units Subcutaneous QHS Olalere, Adewale A, MD      . insulin glargine (LANTUS) injection 18 Units  18 Units Subcutaneous Daily Olalere, Adewale A, MD      . insulin regular, human (MYXREDLIN) 100 units/ 100 mL infusion   Intravenous Continuous [name], MD 0.5 mL/hr at 10/21/18 1000    . losartan (COZAAR) tablet 100 mg  100 mg Oral Daily Lorella Nimrod, MD   100 mg at 10/21/18 1049  . MEDLINE mouth rinse  15 mL Mouth Rinse q12n4p Sarina Ill B, MD   15 mL at 10/20/18 1749  . pantoprazole (PROTONIX) injection 40 mg  40 mg Intravenous QHS [name], MD      . senna-docusate (Senokot-S) tablet 1 tablet  1 tablet Oral BID Lorella Nimrod, MD   Stopped at 10/21/18 1013     Discharge Medications: Please see discharge summary for a list of discharge medications.  Relevant Imaging Results:  Relevant Lab Results:   Additional Information SSN: [id number]  Alberteen Sam, LCSW

## 2018-10-21 NOTE — Progress Notes (Signed)
Pt too agitated and non-cooperative to obtain urine from an in and out catheterization for UA and UDS. Dr. Erlinda Hong made aware and said it was ok to obtain sample from [facility] contatiner.

## 2018-10-21 NOTE — Progress Notes (Signed)
Physical Therapy Treatment Patient Details Name: Tina [name] MRN: [id number] DOB: [birth year] Today's Date: 10/21/2018    History of Present Illness 53 year old female admitted 10/18/2018 with AMS.  Head CT = right thalamic hemorrhage with intraventricular extension. Most of the blood is in the left lateral ventricle. PMHx: DM, HTN, Paroxysmal A Fib, Turner syndrome, legal blindness.    PT Comments    Cosession with OT. Pt lethargic with minimal eye opening with some limited responses mostly of slight head nods and some automatic statements at times. Pt able to progress mobility to include gait and attempting brushing teeth but remains lethargic with lack of initiation and command following. PT on 4-6L throughout session with SpO2 >90%. Will continue to follow and SNF remains appropriate. Recommend daily mobility with nursing.     Follow Up Recommendations  SNF;Supervision/Assistance - 24 hour     Equipment Recommendations  Rolling walker with 5" wheels    Recommendations for Other Services       Precautions / Restrictions Precautions Precautions: Fall    Mobility  Bed Mobility Overal bed mobility: Needs Assistance Bed Mobility: Supine to Sit     Supine to sit: Mod assist;+2 for physical assistance;HOB elevated     General bed mobility comments: HOB 20 degrees mod assist to initiate, assist to move legs to EOB and elevate trunk with increased time  Transfers Overall transfer level: Needs assistance   Transfers: Sit to/from Stand Sit to Stand: Min assist         General transfer comment: min assist to initiate and rise from bed and from recliner as well as BSC. Pt needs max assist for hand placement on Rw and for anterior translation  Ambulation/Gait Ambulation/Gait assistance: Min assist;+2 safety/equipment Gait Distance (Feet): 80 Feet Assistive device: Rolling walker (2 wheeled) Gait Pattern/deviations: Step-through pattern;Decreased stride length;Wide base  of support   Gait velocity interpretation: <1.8 ft/sec, indicate of risk for recurrent falls General Gait Details: slow gait with assist to direct and control RW, cues for position in RW as pt too posterior, close chair follow   Stairs             Wheelchair Mobility    Modified Rankin (Stroke Patients Only) Modified Rankin (Stroke Patients Only) Pre-Morbid Rankin Score: No symptoms Modified Rankin: Moderately severe disability     Balance Overall balance assessment: Needs assistance Sitting-balance support: Bilateral upper extremity supported Sitting balance-Leahy Scale: Fair Sitting balance - Comments: no UE assist sitting eOB able to be minguard for stability and safety     Standing balance-Leahy Scale: Poor Standing balance comment: requires bilUE assist                            Cognition Arousal/Alertness: Lethargic Behavior During Therapy: Flat affect Overall Cognitive Status: Difficult to assess Area of Impairment: Following commands;Attention;Memory;Problem solving                   Current Attention Level: Focused Memory: Decreased short-term memory Following Commands: Follows one step commands inconsistently;Follows one step commands with increased time     Problem Solving: Slow processing;Decreased initiation;Requires verbal cues;Requires tactile cues General Comments: pt with eyes half open throughout session. Spontaneous comments of "dont' push me", shaking head at times.when asked what I was wearing she said "white"      Exercises      General Comments        Pertinent Vitals/Pain Pain Assessment: Faces Faces  Pain Scale: Hurts a little bit Pain Descriptors / Indicators: Discomfort;Grimacing Pain Intervention(s): Monitored during session    Home Living                      Prior Function            PT Goals (current goals can now be found in the care plan section) Progress towards PT goals: Progressing  toward goals    Frequency           PT Plan Current plan remains appropriate    Co-evaluation PT/OT/SLP Co-Evaluation/Treatment: Yes Reason for Co-Treatment: Complexity of the patient's impairments (multi-system involvement);Necessary to address cognition/behavior during functional activity;For patient/therapist safety PT goals addressed during session: Mobility/safety with mobility;Balance;Proper use of DME        AM-PAC PT "6 Clicks" Mobility   Outcome Measure  Help needed turning from your back to your side while in a flat bed without using bedrails?: A Lot Help needed moving from lying on your back to sitting on the side of a flat bed without using bedrails?: A Lot Help needed moving to and from a bed to a chair (including a wheelchair)?: A Lot Help needed standing up from a chair using your arms (e.g., wheelchair or bedside chair)?: A Little Help needed to walk in hospital room?: A Little Help needed climbing 3-5 steps with a railing? : A Lot 6 Click Score: 14    End of Session Equipment Utilized During Treatment: Gait belt;Oxygen Activity Tolerance: Patient tolerated treatment well Patient left: in chair;with call bell/phone within reach;with chair alarm set;with restraints reapplied Nurse Communication: Mobility status PT Visit Diagnosis: Other symptoms and signs involving the nervous system (E31.540)     Time: 0867-6195 PT Time Calculation (min) (ACUTE ONLY): 40 min  Charges:  $Gait Training: 8-22 mins                     [place] Pager: [phone] Office: [place] 10/21/2018, 9:34 AM

## 2018-10-21 NOTE — Progress Notes (Addendum)
Inpatient Diabetes Program Recommendations  AACE/ADA: New Consensus Statement on Inpatient Glycemic Control (2015)  Target Ranges:  Prepandial:   less than 140 mg/dL      Peak postprandial:   less than 180 mg/dL (1-2 hours)      Critically ill patients:  140 - 180 mg/dL   Lab Results  Component Value Date   GLUCAP 111 (H) 10/21/2018   HGBA1C 5.7 (H) 06/13/2018    Review of Glycemic Control Results for YUMALAY, CIRCLE (MRN [id number]) as of 10/21/2018 09:26  Ref. Range 10/21/2018 05:10 10/21/2018 06:25 10/21/2018 07:21 10/21/2018 08:26  Glucose-Capillary Latest Ref Range: 70 - 99 mg/dL 238 (H) 190 (H) 147 (H) 96   Diabetes history: Type 1 DM Outpatient Diabetes medications: Medronic insulin pump Current orders for Inpatient glycemic control: IV insulin  Inpatient Diabetes Program Recommendations:    When ready to transition off insulin drip, consider Levemir 24 units 2 hours prior to discontinuation of IV insulin, then QD following.  Additionally, patient will need Novolog 0-9 units Q4H since not able to tolerate intake.   Insulin pump settings per chart review from 05/2018: Basal insulin  12A 1.45 units/hour 4A 0.850 units/hour 8A 2.00 units/hour 10A 1.65 units/hour 2P 1.35 units/hour Total daily basal insulin: 33.3 units/24 hours  Carb Coverage 1:6 1 unit for every 6 grams of carbohydrates Insulin Sensitivity 1:36 1 unit drops blood glucose 36 mg/dl  Will continue to follow.   Addendum@1045 : noted new consult regarding "long term management". Verified with RN that patient is not appropriate at this time for insulin pump managemgent. Will follow patient regarding disposition and plan accordingly. Reviewed with RN new orders and to give basal, then wait 2 hours prior to discontinuing IV insulin.   Thanks, Bronson Curb, MSN, RNC-OB Diabetes Coordinator [phone] (8a-5p)

## 2018-10-21 NOTE — Evaluation (Signed)
Occupational Therapy Evaluation Patient Details Name: Tina [name] MRN: [id number] DOB: June 26, 1966 Today's Date: 10/21/2018    History of Present Illness 53 year old female admitted 10/18/2018 with AMS.  Head CT = right thalamic hemorrhage with intraventricular extension. Most of the blood is in the left lateral ventricle. PMHx: DM, HTN, Paroxysmal A Fib, Turner syndrome, legal blindness.   Clinical Impression   Pt continues to present with decreased balance, strength, cognition, and initiation/sequencing of tasks. Pt requiring Max cues and hand over hand to participate in ADLs. Pt performing functional mobility with RW and Min A +2. However, pt not initiating mobility without tactile and verbal cues. Continue to recommend dc to post-acute rehab and will continue to follow acutely as admitted.     Follow Up Recommendations  SNF;Supervision/Assistance - 24 hour    Equipment Recommendations  Other (comment)(Defer to next venue)    Recommendations for Other Services PT consult;Speech consult     Precautions / Restrictions Precautions Precautions: Fall Restrictions Weight Bearing Restrictions: No      Mobility Bed Mobility Overal bed mobility: Needs Assistance Bed Mobility: Supine to Sit     Supine to sit: Mod assist;+2 for physical assistance;HOB elevated     General bed mobility comments: HOB 20 degrees mod assist to initiate, assist to move legs to EOB and elevate trunk with increased time  Transfers Overall transfer level: Needs assistance Equipment used: Rolling walker (2 wheeled) Transfers: Sit to/from Stand Sit to Stand: Min assist Stand pivot transfers: Max assist;+2 physical assistance       General transfer comment: min assist to initiate and rise from bed and from recliner as well as BSC. Pt needs max assist for hand placement on Rw and for anterior translation    Balance Overall balance assessment: Needs assistance Sitting-balance support: Bilateral  upper extremity supported Sitting balance-Leahy Scale: Fair Sitting balance - Comments: no UE assist sitting eOB able to be minguard for stability and safety Postural control: Posterior lean Standing balance support: During functional activity Standing balance-Leahy Scale: Poor Standing balance comment: requires bilUE assist                           ADL either performed or assessed with clinical judgement   ADL Overall ADL's : Needs assistance/impaired     Grooming: Oral care;Maximal assistance;Sitting;Standing Grooming Details (indicate cue type and reason): Requiring Max cues. Max hand over hand for performance of oral care. Pt not initating or sequencing tasks.              Lower Body Dressing: Total assistance;Bed level Lower Body Dressing Details (indicate cue type and reason): donning socks             Functional mobility during ADLs: Minimal assistance;+2 for safety/equipment General ADL Comments: Pt continues to present with poor cognition, initation, awareness, arousal, balance, and strength     Vision         Perception     Praxis      Pertinent Vitals/Pain Pain Assessment: Faces Faces Pain Scale: Hurts a little bit Pain Descriptors / Indicators: Discomfort;Grimacing;Restless Pain Intervention(s): Monitored during session;Limited activity within patient's tolerance;Repositioned     Hand Dominance     Extremity/Trunk Assessment Upper Extremity Assessment Upper Extremity Assessment: Difficult to assess due to impaired cognition;Generalized weakness RUE Deficits / Details: Weak grasp and dropping tooth brush RUE Coordination: decreased fine motor;decreased gross motor LUE Deficits / Details: Able to bring hand to mouth to  scratch lip LUE Coordination: decreased fine motor;decreased gross motor   Lower Extremity Assessment Lower Extremity Assessment: Defer to PT evaluation       Communication     Cognition Arousal/Alertness:  Lethargic Behavior During Therapy: Flat affect Overall Cognitive Status: Difficult to assess Area of Impairment: Following commands;Attention;Memory;Problem solving                   Current Attention Level: Focused Memory: Decreased short-term memory Following Commands: Follows one step commands inconsistently;Follows one step commands with increased time     Problem Solving: Slow processing;Decreased initiation;Requires verbal cues;Requires tactile cues General Comments: pt with eyes half open throughout session. Spontaneous comments of "dont' push me", shaking head at times.when asked what I was wearing she said "white". Pt not initiating any task and requires atleast Min tactile cues for intiating mobility. Pt requiring Max cues and A for performance of ADLs.   General Comments  VSS. 4L O2    Exercises     Shoulder Instructions      Home Living                                          Prior Functioning/Environment                   OT Problem List:        OT Treatment/Interventions:      OT Goals(Current goals can be found in the care plan section) Acute Rehab OT Goals Patient Stated Goal: none stated OT Goal Formulation: Patient unable to participate in goal setting Time For Goal Achievement: 11/02/18 Potential to Achieve Goals: Good ADL Goals Pt Will Perform Grooming: with min assist;sitting Pt Will Perform Lower Body Dressing: with mod assist;sit to/from stand Pt Will Transfer to Toilet: with mod assist;ambulating;bedside commode Pt Will Perform Toileting - Clothing Manipulation and hygiene: with mod assist;sit to/from stand;sitting/lateral leans Additional ADL Goal #1: Pt will perform bed mobility with Min A in preparation for ADLs Additional ADL Goal #2: Pt will sustain attention to ADL with Min cues  OT Frequency: Min 2X/week   Barriers to D/C:            Co-evaluation PT/OT/SLP Co-Evaluation/Treatment: Yes Reason for  Co-Treatment: Complexity of the patient's impairments (multi-system involvement);To address functional/ADL transfers;For patient/therapist safety   OT goals addressed during session: ADL's and self-care      AM-PAC OT "6 Clicks" Daily Activity     Outcome Measure Help from another person eating meals?: Total Help from another person taking care of personal grooming?: A Lot Help from another person toileting, which includes using toliet, bedpan, or urinal?: Total Help from another person bathing (including washing, rinsing, drying)?: Total Help from another person to put on and taking off regular upper body clothing?: Total Help from another person to put on and taking off regular lower body clothing?: Total 6 Click Score: 7   End of Session Equipment Utilized During Treatment: Gait belt Nurse Communication: Mobility status  Activity Tolerance: Patient limited by lethargy Patient left: in chair;with call bell/phone within reach;with chair alarm set;with restraints reapplied  OT Visit Diagnosis: Unsteadiness on feet (R26.81);Other abnormalities of gait and mobility (R26.89);Muscle weakness (generalized) (M62.81);Other symptoms and signs involving cognitive function                Time: 1287-8676 OT Time Calculation (min): 38 min Charges:  OT General  Charges $OT Visit: 1 Visit OT Treatments $Self Care/Home Management : 23-37 mins  [place], OTR/L Acute Rehab Pager: [phone] Office: [place] 10/21/2018, 3:34 PM

## 2018-10-21 NOTE — Evaluation (Signed)
Speech Language Pathology Evaluation Patient Details Name: Tina [name] MRN: [id number] DOB: [birth year] Today's Date: 10/21/2018 Time: 7846-9629 SLP Time Calculation (min) (ACUTE ONLY): 15 min  Problem List:  Patient Active Problem List   Diagnosis Date Noted  . Intracerebral hemorrhage 10/20/2018  . Intracranial hemorrhage ([place]) 10/18/2018  . Encounter for therapeutic drug monitoring 06/19/2018  . Atrial fibrillation ([place]) 06/19/2018  . Atrial fibrillation with RVR ([place]) 06/11/2018  . IDDM (insulin dependent diabetes mellitus) ([place]) 11/17/2013  . Hypersomnolence 09/18/2013  . Chronic cough 09/18/2013  . Abnormal transaminases 06/04/2013  . Abnormal alkaline phosphatase test 06/04/2013  . PAC (premature atrial contraction) 09/14/2011  . Reflux 08/26/2011  . Hyperglycemia 08/24/2011  . History of retinal detachment 08/24/2011  . Vitreous hemorrhage ([place]) 08/24/2011  . UTI (lower urinary tract infection) 08/24/2011  . HTN (hypertension) 08/24/2011  . Palpitations 10/25/2010   Past Medical History:  Past Medical History:  Diagnosis Date  . Arthritis   . Cataract   . Diabetic retinopathy ([place])   . Diverticulosis   . DM (diabetes mellitus), type 1 ([place])   . Dyspnea   . Elevated LFTs   . Hyperlipidemia   . Hypertension   . Internal hemorrhoids   . Legally blind in right eye, as defined in [place]   . Osteoporosis 11/2017   T score -3.8  . Retinal detachment   . Tachycardia   . Tubular adenoma of colon   . Turner's syndrome   . Visual loss, bilateral    right eye light perception only and left eye 20/100   Past Surgical History:  Past Surgical History:  Procedure Laterality Date  . CATARACT EXTRACTION    . CHOLECYSTECTOMY    . Hepatic adenoma removed    . RETINAL DETACHMENT SURGERY    . right eye surgery      3 years ago  . TONSILLECTOMY     HPI:  53 year old female admitted 10/18/2018 with AMS. PMH: DM, HTN, Paroxysmal A Fib, Turner syndrome, legal blindness.  Head CT = right thalamic hemorrhage with intraventricular extension. Most of the blood is in the left lateral ventricle.   Assessment / Plan / Recommendation Clinical Impression  Limited cognitive-linguistic evaluation performed. Pt appeared more alert today, with only 1 episode of sudden snoring inspiration noted during session. Pt was largely nonvocal, and is currently unable to follow verbal commands. Vocalizations were intermittent, with voice quality clear and low in volume during assessment. However, when interacting with MD and RN following evaluation, pt was observed to exhibit several words and phrases with full intelligibility (stop, I'm ok, you're hurting me, what?, ow). SLP will continue to follow pt at bedside for diagnostic treatment for cognitive linguistic impairments.     SLP Assessment  SLP Recommendation/Assessment: Patient needs continued Speech Language Pathology Services SLP Visit Diagnosis: Cognitive communication deficit (R41.841)    Follow Up Recommendations  (TBD)    Frequency and Duration min 2x/week  2 weeks      SLP Evaluation Cognition  Overall Cognitive Status: Difficult to assess Arousal/Alertness: Awake/alert Orientation Level: Disoriented X4 Behaviors: Restless       Comprehension  Auditory Comprehension Overall Auditory Comprehension: Impaired Yes/No Questions: Impaired Basic Biographical Questions: 0-25% accurate Commands: Impaired One Step Basic Commands: 0-24% accurate    Expression Expression Primary Mode of Expression: Verbal Verbal Expression Overall Verbal Expression: Impaired(Pt minimally vocal during assessment) Initiation: No impairment(When disturbed, pt will speak clearly - stop that, ow, what?)   Oral / Motor  Oral Motor/Sensory  Function Overall Oral Motor/Sensory Function: Within functional limits Motor Speech Overall Motor Speech: Appears within functional limits for tasks assessed Articulation: Within functional  limitis Intelligibility: Intelligible   GO                   Tina [name] B. Tina [name] [facility], CCC-SLP Speech Language Pathologist [phone]  Tina [name] 10/21/2018, 9:44 AM

## 2018-10-21 NOTE — Progress Notes (Signed)
STROKE TEAM PROGRESS NOTE   SUBJECTIVE (INTERVAL HISTORY) Patient sitting in chair, lethargic, not following commands, but able to say "leave me alone" "it hurts" "I am okay" "I am fine".  Glucose 111, on very low-dose IV insulin.  Passed swallow on dysphagia 1.  Discussed with CCM, will start diet and subcu insulin.   OBJECTIVE Vitals:   10/21/18 0700 10/21/18 0800 10/21/18 0808 10/21/18 0900  BP: 128/76  129/75 (!) 144/79  Pulse: (!) 110 (!) 104 (!) 109 (!) 101  Resp: (!) 25 20  (!) 26  Temp:  (!) 97.4 F (36.3 C)    TempSrc:  Oral    SpO2: 94% 98% 94% 96%  Weight:      Height:        CBC:  Recent Labs  Lab 10/20/18 0356 10/20/18 1154 10/21/18 0528  WBC 21.7*  --  20.0*  HGB 12.8 10.9* 11.9*  HCT 42.9 32.0* 37.1  MCV 101.7*  --  95.4  PLT 247  --  644    Basic Metabolic Panel:  Recent Labs  Lab 10/18/18 1050  10/20/18 2235 10/21/18 0528  NA 143   < > 150* 154*  K 4.3   < > 4.1 4.2  CL 109   < > 119* 123*  CO2 23   < > 17* 16*  GLUCOSE 180*   < > 232* 266*  BUN 24*   < > 47* 48*  CREATININE 1.36*   < > 2.10* 2.11*  CALCIUM 9.2   < > 9.1 9.3  MG 2.3  --   --  2.4  PHOS 4.4  --   --  3.9   < > = values in this interval not displayed.    Lipid Panel:     Component Value Date/Time   CHOL 273 (H) 10/06/2009 2131   TRIG 79 10/20/2018 0356   HDL 93 10/06/2009 2131   CHOLHDL 2.9 Ratio 10/06/2009 2131   VLDL 17 10/06/2009 2131   LDLCALC 163 (H) 10/06/2009 2131   HgbA1c:  Lab Results  Component Value Date   HGBA1C 5.7 (H) 06/13/2018   Urine Drug Screen: No results found for: LABOPIA, COCAINSCRNUR, LABBENZ, AMPHETMU, THCU, LABBARB  Alcohol Level     Component Value Date/Time   ETH <10 10/18/2018 1050    IMAGING Ct Head Wo Contrast 10/18/2018 IMPRESSION:  Right thalamic hemorrhage with intraventricular extension. Most of the blood is located in the left lateral ventricle.   Mr Brain Wo Contrast 10/18/2018 IMPRESSION:  Severely motion degraded  examination showing unchanged size of intraparenchymal hematoma centered in the right thalamus with intraventricular extension.   CT Angiogram Head  10/19/2018 IMPRESSION: 1. Size stable right thalamic and intraventricular clot with dilatation of the left temporal horn. 2. Tiny spot sign associated with the right thalamic hematoma, of questionable significance given 24 hour stability of intracranial hemorrhage. No underlying vascular malformation or aneurysm. 3. Small remote left thalamic and left cerebellar infarcts.  Ct Head Wo Contrast 10/20/2018 1. Unchanged size of intraparenchymal hematoma centered in the right thalamus. 2. Unchanged volume of intraventricular blood predominantly in the left lateral ventricle. 3. No new site of hemorrhage.    Imaging past 24h Dg Chest Port 1 View  Result Date: 10/20/2018 CLINICAL DATA:  53 year old female with respiratory failure, acute intracranial hemorrhage EXAM: PORTABLE CHEST 1 VIEW COMPARISON:  10/18/2018 FINDINGS: Cardiomediastinal silhouette unchanged in size and contour. Mild fullness in the central vasculature. Coarsened interstitial markings bilaterally without confluent airspace disease. No  pneumothorax. No pleural effusion. No displaced fracture. IMPRESSION: Low lung volumes with likely atelectasis/chronic changes without evidence of lobar pneumonia or edema Electronically Signed   By: Corrie Mckusick D.O.   On: 10/20/2018 17:04    Transthoracic Echocardiogram  06/12/2018 Study Conclusions - Left ventricle: The cavity size was normal. Systolic function was   normal. The estimated ejection fraction was in the range of 60%   to 65%. Wall motion was normal; there were no regional wall   motion abnormalities. Abnormal relaxation with increased filling   pressures. - Aortic valve: There was no regurgitation. - Aortic root: The aortic root was normal in size. - Mitral valve: There was trivial regurgitation. - Right ventricle: Systolic function was  normal. - Atrial septum: No defect or patent foramen ovale was identified. - Tricuspid valve: There was no significant regurgitation. - Inferior vena cava: The vessel was normal in size. The   respirophasic diameter changes were in the normal range (>= 50%),   consistent with normal central venous pressure. - Pericardium, extracardiac: A trivial pericardial effusion was   identified. Impressions: - Normal LV EF, no significant valvular abnormalities.  EKG - SR rate 92 BPM. (See cardiology reading for complete details)   PHYSICAL EXAM  General - obese, well developed, lethargic.  Ophthalmologic - fundi not visualized due to noncooperation.  Cardiovascular - Regular rhythm, not in A. Fib, but tachycardia.  Neuro - Patient is lethargic, eyes open, but still agitated with pain stimulation and not following commands, not answering any questions appropriately however she is verbalizing "leave me alone, it hurts", right ocular prosthesis, no blinking to threat in the left eye, no spontaneous eye movement, left slight proptosis, no significant facial asymmetry, tongue midline in mouth.  Moving all extremities strongly on pain stimulation except right upper extremity 3+/5 with pain.  DTR not corporative, Sensation, coordination and gait not tested.   ASSESSMENT/PLAN Tina [name] is a 53 y.o. female with history of type I diabetes, hypertension, paroxysmal A. fib on Coumadin, Turner syndrome and legal blindness presenting with AMS, mildly hypertensive and combative. She did not receive IV t-PA due to hemorrhage.  Right thalamic hemorrhage with intraventricular extension, likely hypertensive in the setting of Coumadin coagulopathy s/p reversal  CT head - Right thalamic hemorrhage with left IVH.   MRI head - unchanged ICH centered in the right thalamus with intraventricular extension.   CTA Head - Size stable right thalamic and intraventricular clot with dilatation of the left  temporal horn. Small remote left thalamic and left cerebellar infarcts.  No aneurysm, or AVM  Repeat CT in am  2D Echo - 06/12/2018 - EF 60 - 65%.   LDL pending   HgbA1c - pending  UDS - pending   VTE prophylaxis - Heparin subcu  warfarin daily prior to admission, INR 2.6, reversed with vitamin K and Kcentra.  Now on No antithrombotic   NSG consulted, no intervention needed at this time  Therapy recommendations:  SNF, RW 5" wheels  Disposition:  Pending  DKA with type 1 diabetes  HgbA1c 5.7, at goal < 7.0  Switch from insulin IV to Lantus  DM coordinator on board   CCM consulted. Starting on lantus  Hyperglycemia improved  SSI  CBG monitoring  Close monitoring  PAF  Currently in NSR  On Coumadin at home  INR 2.6 on admission, therapeutic -> reversed with Kcentra and vitamin K  INR now 1.1  Hold off antithrombotic for now due to [place]  AKI  with hypernatremia  AKI - creatinine - 1.36 ->2.14->1.87->2.11  Na - 156->154 - continue IVF  Increase IVF to 75cc  Passed swallow - encourage po intake  Hypertension  Stable 120-140s  Still on Cleviprex . SBP goal < 160 mmHg . Home meds:  cardizem 240, cozaar 50 bid . Add metoprolol 25mg  bid . Wean cleviprex as able . Long-term BP goal normotensive   Hyperlipidemia  Lipid lowering medication PTA: Crestor 20 mg daily  LDL pending, goal < 70  Current lipid lowering medication: none  Consider statin at discharge  Other Stroke Risk Factors  Former cigarette smoker - quit 30 years ago  ETOH use, advised to drink no more than 1 alcoholic beverage per day.  Obesity, Body mass index is 31.34 kg/m., recommend weight loss, diet and exercise as appropriate   Previous strokes by imaging - Small remote left thalamic and left cerebellar infarcts.  Baseline OSA - no CPAP at this time given agitation as per CCM  Other Active Problems  INR - 2.6 on admission - reversed -> 1.1  Anemia of chronic  disease- 12.9->10.9->11.9  Leukocytosis - 21.7->20 (afebrile)  Agitation/encephalopathy - off precedex  Hospital day # 3  This patient is critically ill due to [place], IVH, DKA, PAF and at significant risk of neurological worsening, death form recurrent bleeding, hydrocephalus, heart failure, DKA, seizure. This patient's care requires constant monitoring of vital signs, hemodynamics, respiratory and cardiac monitoring, review of multiple databases, neurological assessment, discussion with family, other specialists and medical decision making of high complexity. I spent 40 minutes of neurocritical care time in the care of this patient.  [name], MD PhD Stroke Neurology 10/21/2018 9:32 AM   To contact Stroke Continuity provider, please refer to [email or web address] After hours, contact General Neurology

## 2018-10-21 NOTE — Progress Notes (Signed)
  Speech Language Pathology Treatment: Dysphagia  Patient Details Name: Tina [name] MRN: [id number] DOB: [birth year] Today's Date: 10/21/2018 Time: 4742-5956 SLP Time Calculation (min) (ACUTE ONLY): 30 min  Assessment / Plan / Recommendation Clinical Impression  Pt seen at bedside to determine readiness for po intake. Pt seated in recliner, slightly restless. Oral care was completed with suction, with pt gently chewing on suction swab. Unable to complete full CV exam, however, pt face appears symmetrical, intermittent speech is intelligible, and no anterior leakage or residue noted. Following oral care, pt accepted trials of ice chips, thin liquid, puree, and solid consistency. Pt passed 3oz water challenge. Puree boluses were also tolerated without obvious oral difficulty or overt s/s aspiration. Extended oral prep of solid was noted, with one episode of sudden snoring inspiration. This raises concern for increased aspiration risk if pt becomes fatigued, and increased risk of aspirating particulate solids if she exhibits this sudden inspiration during po intake.   At this time, recommend conservative puree diet and thin liquids when pt is AWAKE and FULLY UPRIGHT. Crushed meds encouraged. Safe swallow precautions posted at [facility] and discussed with RN and MD. SLP will follow for assessment of diet tolerance and education.   HPI HPI: 53 year old female admitted 10/18/2018 with AMS. PMH: DM, HTN, Paroxysmal A Fib, Turner syndrome, legal blindness. Head CT = right thalamic hemorrhage with intraventricular extension. Most of the blood is in the left lateral ventricle.      SLP Plan  Continue with current plan of care       Recommendations  Diet recommendations: Dysphagia 1 (puree);Thin liquid Liquids provided via: Straw Medication Administration: Crushed with puree Supervision: Full supervision/cueing for compensatory strategies;Trained caregiver to feed patient Compensations: Minimize  environmental distractions;Slow rate;Small sips/bites Postural Changes and/or Swallow Maneuvers: Seated upright 90 degrees;Upright 30-60 min after meal                Oral Care Recommendations: Oral care QID Follow up Recommendations: (TBD) SLP Visit Diagnosis: Dysphagia, unspecified (R13.10) Plan: Continue with current plan of care       Tina [name]. [facility], CCC-SLP Speech Language Pathologist [phone]  Tina [name] 10/21/2018, 9:25 AM

## 2018-10-21 NOTE — Progress Notes (Addendum)
NAME:  Tina [name], MRN:  [id number], DOB:  27-Jul-1965, LOS: 3 ADMISSION DATE:  10/18/2018, CONSULTATION DATE:  10/20/2018 REFERRING MD:  Dr Erlinda Hong, CHIEF COMPLAINT:  DKA   Brief History   53 year old obese female with hx of OSA on CPAP at hoe and typle 1 DM. Admitted 10/18/2018 for Intracranial bleed . Course complicated by agitation and compounded by nPO status, develolping DKA and inability  to do biPap / cpap and also gettting benzo.opioid for procedures such as imaging brain. PCCM called as she is on 6L [place] - 98% (baseline -  ? On o2) And also DKA  Past Medical History   Past Medical History:  Diagnosis Date  . Arthritis   . Cataract   . Diabetic retinopathy ([place])   . Diverticulosis   . DM (diabetes mellitus), type 1 ([place])   . Dyspnea   . Elevated LFTs   . Hyperlipidemia   . Hypertension   . Internal hemorrhoids   . Legally blind in right eye, as defined in [place]   . Osteoporosis 11/2017   T score -3.8  . Retinal detachment   . Tachycardia   . Tubular adenoma of colon   . Turner's syndrome   . Visual loss, bilateral    right eye light perception only and left eye 20/100   Significant Hospital Events   10/18/2018= admit 4/5 - DKA ccm consult  Consults:  4/5 - ccm consult Procedures:    Significant Diagnostic Tests:  Chest x-ray on 10/20/2018-basal atelectasis Antimicrobials:   Interim history/subjective:  No overnight events  Objective   Blood pressure 131/85, pulse (!) 109, temperature (!) 97.4 F (36.3 C), temperature source Oral, resp. rate (!) 27, height 4\' 9"  (1.448 m), weight 65.7 kg, SpO2 98 %.    FiO2 (%):  [98 %] 98 %   Intake/Output Summary (Last 24 hours) at 10/21/2018 1003 Last data filed at 10/21/2018 1000 Gross per 24 hour  Intake 1096.27 ml  Output 450 ml  Net 646.27 ml   Filed Weights   10/18/18 1104 10/18/18 1700  Weight: 68.5 kg 65.7 kg    Examination: General: Middle-aged, does not appear to be in distress HENT: Moist oral mucosa Lungs:  Clear breath sounds bilaterally Cardiovascular: S1-S2 appreciated Abdomen: Soft, nontender Extremities: No edema, no clubbing Neuro: Alert, disoriented GU: [facility] Problem list     Assessment & Plan:  DKA continue with DKA protocol We will start diet today Start Lantus 0.3/kg  Encephalopathy likely multifactorial Correct sugars Avoid benzodiazepines/opioids Continue Precedex  Intracerebral hemorrhage Stable on CT Neurology following  Acute respiratory failure ABG noted Oxygen supplementation as needed  History of obstructive sleep apnea Leave off CPAP at present  Leukocytosis Trend CBCs  Best practice:  Diet: N.p.o. Pain/Anxiety/Delirium protocol (if indicated): Precedex VAP protocol (if indicated): DVT prophylaxis: SCD GI prophylaxis: Glucose control: By protocol Mobility: Bedrest Code Status: Full code Family Communication: No family at bedside Disposition: ICU  Labs   CBC: Recent Labs  Lab 10/18/18 1112 10/20/18 0356 10/20/18 1154 10/21/18 0528  WBC  --  21.7*  --  20.0*  HGB 12.9 12.8 10.9* 11.9*  HCT 38.0 42.9 32.0* 37.1  MCV  --  101.7*  --  95.4  PLT  --  247  --  678    Basic Metabolic Panel: Recent Labs  Lab 10/18/18 1050  10/20/18 1112 10/20/18 1154 10/20/18 1447 10/20/18 1908 10/20/18 2235 10/21/18 0528  NA 143   < >  153* 156* 154* 155* 150* 154*  K 4.3   < > 3.7 3.6 4.4 3.9 4.1 4.2  CL 109   < > 125*  --  124* 124* 119* 123*  CO2 23   < > 21*  --  18* 20* 17* 16*  GLUCOSE 180*   < > 144*  --  169* 201* 232* 266*  BUN 24*   < > 47*  --  45* 48* 47* 48*  CREATININE 1.36*   < > 1.87*  --  1.87* 2.08* 2.10* 2.11*  CALCIUM 9.2   < > 9.2  --  9.3 9.3 9.1 9.3  MG 2.3  --   --   --   --   --   --  2.4  PHOS 4.4  --   --   --   --   --   --  3.9   < > = values in this interval not displayed.   GFR: Estimated Creatinine Clearance: 24.3 mL/min (A) (by C-G formula based on SCr of 2.11 mg/dL (H)). Recent Labs   Lab 10/18/18 1050 10/20/18 0356 10/20/18 2235 10/21/18 0528  WBC  --  21.7*  --  20.0*  LATICACIDVEN 1.4  --  1.4  --     Liver Function Tests: Recent Labs  Lab 10/18/18 1050 10/21/18 0528  AST 42* 61*  ALT 69* 70*  ALKPHOS 126 99  BILITOT 1.1 1.5*  PROT 6.4* 6.1*  ALBUMIN 3.2* 3.1*   Recent Labs  Lab 10/18/18 1050  LIPASE 20   Recent Labs  Lab 10/18/18 1050  AMMONIA 9    ABG    Component Value Date/Time   PHART 7.415 10/20/2018 1154   PCO2ART 33.5 10/20/2018 1154   PO2ART 64.0 (L) 10/20/2018 1154   HCO3 21.5 10/20/2018 1154   TCO2 22 10/20/2018 1154   ACIDBASEDEF 2.0 10/20/2018 1154   O2SAT 92.0 10/20/2018 1154     Coagulation Profile: Recent Labs  Lab 10/18/18 1413 10/18/18 2213 10/19/18 0246 10/20/18 0356 10/21/18 0528  INR 1.0 1.2 1.1 1.1 1.1    Cardiac Enzymes: Recent Labs  Lab 10/18/18 1050  TROPONINI 0.03*    HbA1C: Hgb A1c MFr Bld  Date/Time Value Ref Range Status  06/13/2018 05:59 AM 5.7 (H) 4.8 - 5.6 % Final    Comment:    (NOTE) Pre diabetes:          5.7%-6.4% Diabetes:              >6.4% Glycemic control for   <7.0% adults with diabetes   08/26/2011 02:08 PM 11.0 (H) <5.7 % Final    Comment:    (NOTE)                                                                       According to the ADA Clinical Practice Recommendations for 2011, when HbA1c is used as a screening test:  >=6.5%   Diagnostic of Diabetes Mellitus           (if abnormal result is confirmed) 5.7-6.4%   Increased risk of developing Diabetes Mellitus References:Diagnosis and Classification of Diabetes Mellitus,Diabetes ZOXW,9604,54(UJWJX 1):S62-S69 and Standards of Medical Care in         Diabetes -  2011,Diabetes DUKG,2542,70 (Suppl 1):S11-S61.    CBG: Recent Labs  Lab 10/21/18 0510 10/21/18 0625 10/21/18 0721 10/21/18 0826 10/21/18 0926  GLUCAP 238* 190* 147* 96 111*    Review of Systems:   Unobtainable secondary to mental status  Past  Medical History  She,  has a past medical history of Arthritis, Cataract, Diabetic retinopathy ([place]), Diverticulosis, DM (diabetes mellitus), type 1 ([place]), Dyspnea, Elevated LFTs, Hyperlipidemia, Hypertension, Internal hemorrhoids, Legally blind in right eye, as defined in [place], Osteoporosis (11/2017), Retinal detachment, Tachycardia, Tubular adenoma of colon, Turner's syndrome, and Visual loss, bilateral.   Surgical History    Past Surgical History:  Procedure Laterality Date  . CATARACT EXTRACTION    . CHOLECYSTECTOMY    . Hepatic adenoma removed    . RETINAL DETACHMENT SURGERY    . right eye surgery      3 years ago  . TONSILLECTOMY       Social History   reports that she quit smoking about 30 years ago. Her smoking use included cigarettes. She has a 0.10 pack-year smoking history. She has never used smokeless tobacco. She reports current alcohol use. She reports that she does not use drugs.   Family History   Her family history includes Cancer in her father; Diabetes in her maternal grandmother; Emphysema in her paternal grandfather; Esophageal cancer in her paternal aunt. There is no history of Colon cancer, Colon polyps, Rectal cancer, or Stomach cancer.   Allergies Allergies  Allergen Reactions  . Acetazolamide Anaphylaxis and Other (See Comments)     Reaction, drop in Blood pressure that caused patient to stay in bed for 5 days, inconherient  Reaction, drop in Blood pressure that caused patient to stay in bed for 5 days, inconherient  . Ace Inhibitors Cough    Critical care time: Critical care time spent evaluating patient, reviewing records, formulating plan of care 30 minutes

## 2018-10-21 NOTE — Progress Notes (Signed)
CSW attempted to consult with patient's husband [name] regarding SNF as discharge plan, as patient is disoriented X4. No answer, CSW lvm for [name].   [place], [place]

## 2018-10-22 ENCOUNTER — Inpatient Hospital Stay (HOSPITAL_COMMUNITY): Payer: HMO

## 2018-10-22 DIAGNOSIS — H548 Legal blindness, as defined in USA: Secondary | ICD-10-CM

## 2018-10-22 DIAGNOSIS — E1049 Type 1 diabetes mellitus with other diabetic neurological complication: Secondary | ICD-10-CM

## 2018-10-22 DIAGNOSIS — I69391 Dysphagia following cerebral infarction: Secondary | ICD-10-CM

## 2018-10-22 DIAGNOSIS — E785 Hyperlipidemia, unspecified: Secondary | ICD-10-CM

## 2018-10-22 DIAGNOSIS — I611 Nontraumatic intracerebral hemorrhage in hemisphere, cortical: Secondary | ICD-10-CM

## 2018-10-22 DIAGNOSIS — Q969 Turner's syndrome, unspecified: Secondary | ICD-10-CM

## 2018-10-22 DIAGNOSIS — I480 Paroxysmal atrial fibrillation: Secondary | ICD-10-CM

## 2018-10-22 DIAGNOSIS — Z720 Tobacco use: Secondary | ICD-10-CM

## 2018-10-22 DIAGNOSIS — I959 Hypotension, unspecified: Secondary | ICD-10-CM

## 2018-10-22 DIAGNOSIS — I10 Essential (primary) hypertension: Secondary | ICD-10-CM

## 2018-10-22 LAB — GLUCOSE, CAPILLARY
Glucose-Capillary: 122 mg/dL — ABNORMAL HIGH (ref 70–99)
Glucose-Capillary: 247 mg/dL — ABNORMAL HIGH (ref 70–99)
Glucose-Capillary: 304 mg/dL — ABNORMAL HIGH (ref 70–99)
Glucose-Capillary: 345 mg/dL — ABNORMAL HIGH (ref 70–99)
Glucose-Capillary: 175 mg/dL — ABNORMAL HIGH (ref 70–99)
Glucose-Capillary: 74 mg/dL (ref 70–99)

## 2018-10-22 LAB — BASIC METABOLIC PANEL
Anion gap: 4 — ABNORMAL LOW (ref 5–15)
BUN: 35 mg/dL — ABNORMAL HIGH (ref 6–20)
CO2: 27 mmol/L (ref 22–32)
Calcium: 9.2 mg/dL (ref 8.9–10.3)
Chloride: 128 mmol/L — ABNORMAL HIGH (ref 98–111)
Creatinine, Ser: 1.54 mg/dL — ABNORMAL HIGH (ref 0.44–1.00)
GFR calc Af Amer: 45 mL/min — ABNORMAL LOW (ref >60)
GFR calc non Af Amer: 38 mL/min — ABNORMAL LOW (ref >60)
Glucose, Bld: 335 mg/dL — ABNORMAL HIGH (ref 70–99)
Potassium: 4.3 mmol/L (ref 3.5–5.1)
Sodium: 159 mmol/L — ABNORMAL HIGH (ref 135–145)

## 2018-10-22 LAB — CBC
HCT: 38.4 % (ref 36.0–46.0)
Hemoglobin: 12.1 g/dL (ref 12.0–15.0)
MCH: 30.2 pg (ref 26.0–34.0)
MCHC: 31.5 g/dL (ref 30.0–36.0)
MCV: 95.8 fL (ref 80.0–100.0)
Platelets: 195 10*3/uL (ref 150–400)
RBC: 4.01 MIL/uL (ref 3.87–5.11)
RDW: 14.7 % (ref 11.5–15.5)
WBC: 10.2 10*3/uL (ref 4.0–10.5)
nRBC: 0 % (ref 0.0–0.2)

## 2018-10-22 LAB — LIPID PANEL
Cholesterol: 131 mg/dL (ref 0–200)
HDL: 42 mg/dL (ref >40)
LDL Cholesterol: 65 mg/dL (ref 0–99)
Total CHOL/HDL Ratio: 3.1 RATIO
Triglycerides: 119 mg/dL (ref <150)
VLDL: 24 mg/dL (ref 0–40)

## 2018-10-22 LAB — MAGNESIUM: Magnesium: 2.4 mg/dL (ref 1.7–2.4)

## 2018-10-22 LAB — PHOSPHORUS: Phosphorus: 3.4 mg/dL (ref 2.5–4.6)

## 2018-10-22 LAB — HEMOGLOBIN A1C
Hgb A1c MFr Bld: 6.6 % — ABNORMAL HIGH (ref 4.8–5.6)
Mean Plasma Glucose: 142.72 mg/dL

## 2018-10-22 MED ORDER — ROSUVASTATIN CALCIUM 20 MG PO TABS
20.0000 mg | ORAL_TABLET | Freq: Every day | ORAL | Status: DC
  Administered 2018-10-25 – 2018-11-11 (×12): 20 mg via ORAL
  Filled 2018-10-22 (×16): qty 1

## 2018-10-22 MED ORDER — RESOURCE THICKENUP CLEAR PO POWD
ORAL | Status: DC | PRN
  Filled 2018-10-22: qty 125

## 2018-10-22 MED ORDER — INSULIN GLARGINE 100 UNIT/ML ~~LOC~~ SOLN
12.0000 [IU] | Freq: Two times a day (BID) | SUBCUTANEOUS | Status: DC
  Administered 2018-10-22 – 2018-10-23 (×2): 12 [IU] via SUBCUTANEOUS
  Filled 2018-10-22 (×5): qty 0.12

## 2018-10-22 MED ORDER — INSULIN GLARGINE 100 UNIT/ML ~~LOC~~ SOLN
20.0000 [IU] | Freq: Every day | SUBCUTANEOUS | Status: DC
  Filled 2018-10-22: qty 0.2

## 2018-10-22 MED ORDER — SODIUM CHLORIDE 0.45 % IV SOLN
INTRAVENOUS | Status: DC
  Administered 2018-10-22: 09:00:00 via INTRAVENOUS

## 2018-10-22 MED ORDER — INSULIN ASPART 100 UNIT/ML ~~LOC~~ SOLN
4.0000 [IU] | Freq: Three times a day (TID) | SUBCUTANEOUS | Status: DC
  Administered 2018-10-22 – 2018-10-23 (×3): 4 [IU] via SUBCUTANEOUS

## 2018-10-22 NOTE — Progress Notes (Signed)
Rehab Admissions Coordinator Note:  Patient was screened by Cleatrice Burke for appropriateness for an Inpatient Acute Rehab Consult per PT recommendations. At this time, we are recommending Inpatient Rehab consult.  Cleatrice Burke RN MSN 10/22/2018, 9:46 AM  I can be reached at [phone].

## 2018-10-22 NOTE — Progress Notes (Signed)
STROKE TEAM PROGRESS NOTE   SUBJECTIVE (INTERVAL HISTORY) Patient sitting in chair, still combative with touching and checking glucose level.  As per RN, patient still has sleep apnea with desaturation during sleep.  However, she is not a candidate for BiPAP or CPAP due to requirement of restrain for combative behavior.  CT head this morning repeat showed unchanged hematoma and IVH as well as ventricular size.   OBJECTIVE Vitals:   10/22/18 0500 10/22/18 0600 10/22/18 0700 10/22/18 0800  BP: 134/72 (!) 112/58 (!) 106/56   Pulse: 74 68 71   Resp: 17 18 15    Temp:    97.7 F (36.5 C)  TempSrc:    Axillary  SpO2: 100% 99% 99%   Weight:      Height:        CBC:  Recent Labs  Lab 10/21/18 0528 10/22/18 0550  WBC 20.0* 10.2  HGB 11.9* 12.1  HCT 37.1 38.4  MCV 95.4 95.8  PLT 249 989    Basic Metabolic Panel:  Recent Labs  Lab 10/21/18 0528 10/22/18 0550  NA 154* 159*  K 4.2 4.3  CL 123* 128*  CO2 16* 27  GLUCOSE 266* 335*  BUN 48* 35*  CREATININE 2.11* 1.54*  CALCIUM 9.3 9.2  MG 2.4 2.4  PHOS 3.9 3.4    Lipid Panel:     Component Value Date/Time   CHOL 131 10/22/2018 0550   TRIG 119 10/22/2018 0550   HDL 42 10/22/2018 0550   CHOLHDL 3.1 10/22/2018 0550   VLDL 24 10/22/2018 0550   LDLCALC 65 10/22/2018 0550   HgbA1c:  Lab Results  Component Value Date   HGBA1C 6.6 (H) 10/22/2018   Urine Drug Screen:     Component Value Date/Time   LABOPIA NONE DETECTED 10/21/2018 1640   COCAINSCRNUR NONE DETECTED 10/21/2018 1640   LABBENZ POSITIVE (A) 10/21/2018 1640   AMPHETMU NONE DETECTED 10/21/2018 1640   THCU NONE DETECTED 10/21/2018 1640   LABBARB NONE DETECTED 10/21/2018 1640    Alcohol Level     Component Value Date/Time   ETH <10 10/18/2018 1050    IMAGING Ct Head Wo Contrast 10/18/2018 IMPRESSION:  Right thalamic hemorrhage with intraventricular extension. Most of the blood is located in the left lateral ventricle.   Mr Brain Wo  Contrast 10/18/2018 IMPRESSION:  Severely motion degraded examination showing unchanged size of intraparenchymal hematoma centered in the right thalamus with intraventricular extension.   CT Angiogram Head  10/19/2018 IMPRESSION: 1. Size stable right thalamic and intraventricular clot with dilatation of the left temporal horn. 2. Tiny spot sign associated with the right thalamic hematoma, of questionable significance given 24 hour stability of intracranial hemorrhage. No underlying vascular malformation or aneurysm. 3. Small remote left thalamic and left cerebellar infarcts.  Ct Head Wo Contrast 10/20/2018 1. Unchanged size of intraparenchymal hematoma centered in the right thalamus. 2. Unchanged volume of intraventricular blood predominantly in the left lateral ventricle. 3. No new site of hemorrhage.   Ct Head Wo Contrast 10/22/2018 04:09  Unchanged right thalamic and intraventricular clot which dilates the temporal horn of the left lateral ventricle.   Ct Head Wo Contrast 10/22/2018 CLINICAL DATA:  Follow-up intracranial hemorrhage EXAM: CT HEAD WITHOUT CONTRAST TECHNIQUE: Contiguous axial images were obtained from the base of the skull through the vertex without intravenous contrast. COMPARISON:  Two days ago FINDINGS: Brain: Unchanged hematoma in the right thalamus measuring up to 19 mm anterior-posterior. Intraventricular hemorrhage in the left more than right lateral ventricles with  temporal horn dilatation and mild periventricular low-density on the left. Remote small vessel infarct in the left cerebellum and left thalamus. No evident infarct or shift. Vascular: No hyperdense vessel or unexpected calcification. Skull: No acute or aggressive finding Sinuses/Orbits: Left cataract resection and right ocular prosthesis. IMPRESSION: Unchanged right thalamic and intraventricular clot which dilates the temporal horn of the left lateral ventricle. Electronically Signed   By: Monte Fantasia M.D.   On:  10/22/2018 04:09    Transthoracic Echocardiogram  06/12/2018 Study Conclusions - Left ventricle: The cavity size was normal. Systolic function was   normal. The estimated ejection fraction was in the range of 60%   to 65%. Wall motion was normal; there were no regional wall   motion abnormalities. Abnormal relaxation with increased filling   pressures. - Aortic valve: There was no regurgitation. - Aortic root: The aortic root was normal in size. - Mitral valve: There was trivial regurgitation. - Right ventricle: Systolic function was normal. - Atrial septum: No defect or patent foramen ovale was identified. - Tricuspid valve: There was no significant regurgitation. - Inferior vena cava: The vessel was normal in size. The   respirophasic diameter changes were in the normal range (>= 50%),   consistent with normal central venous pressure. - Pericardium, extracardiac: A trivial pericardial effusion was   identified. Impressions: - Normal LV EF, no significant valvular abnormalities.  EKG - SR rate 92 BPM. (See cardiology reading for complete details)   PHYSICAL EXAM  General - obese, well developed, lethargic.  Ophthalmologic - fundi not visualized due to noncooperation.  Cardiovascular - Regular rhythm, not in A. Fib, but tachycardia.  Neuro - Patient is lethargic, sitting in chair, eyes open, but still agitated and combative with stimulation and not following commands, not answering any questions appropriately however she is verbalizing "it hurts""what are you doing", right ocular prosthesis, no blinking to threat in the left eye, no spontaneous eye movement, left slight proptosis, no significant facial asymmetry, tongue midline in mouth.  Moving all extremities strongly on pain stimulation except right upper extremity 4/5 with spontaneous movement.  DTR not corporative, Sensation, coordination no ataxia bilaterally by observation and gait not tested.   ASSESSMENT/PLAN Ms. Yavonne  Valenti-Cohen is a 53 y.o. female with history of type I diabetes, hypertension, paroxysmal A. fib on Coumadin, Turner syndrome and legal blindness presenting with AMS, mildly hypertensive and combative. She did not receive IV t-PA due to hemorrhage.  Right thalamic hemorrhage with intraventricular extension, likely hypertensive in the setting of Coumadin coagulopathy s/p reversal  CT head - Right thalamic hemorrhage with left IVH.   MRI head - unchanged ICH centered in the right thalamus with intraventricular extension.   CTA Head - Size stable right thalamic and intraventricular clot with dilatation of the left temporal horn. Small remote left thalamic and left cerebellar infarcts.  No aneurysm, or AVM  Repeat CT 4/7 unchanged R thalamic and IVH w/ temporal horm dilation  2D Echo - 06/12/2018 - EF 60 - 65%.   LDL 65   HgbA1c - 6.6  UDS - positive for benzos   VTE prophylaxis - Heparin subcu  warfarin daily prior to admission, INR 2.6, reversed with vitamin K and Kcentra.  Now on No antithrombotic given hmg  NSG consulted, no intervention needed at this time  Therapy recommendations:  CIR, RW 5" wheels  Disposition:  Pending  DKA with type 1 diabetes  HgbA1c 5.7, at goal < 7.0  Switch from insulin IV to  Lantus  On lantus 12 bid, novolog 4 qAC as per DM coordinator  Hyperglycemia persist  SSI  CBG monitoring  Close monitoring  PAF  Currently in NSR  On Coumadin at home  INR 2.6 on admission, therapeutic -> reversed with Kcentra and vitamin K  INR now 1.1  Hold off antithrombotic for now due to [place]  AKI with hypernatremia  AKI - creatinine -1.7->2.11->1.54  Na -154-155-154-159   Start 1/2NS @ 75  Passed swallow - encourage po intake  Hypertension  Stable 120-150s  Off Cleviprex . SBP goal < 160 mmHg . Home meds:  cardizem 240, cozaar 50 bid . Continue metoprolol 25mg  bid . Long-term BP goal normotensive   Hyperlipidemia  Lipid lowering  medication PTA: Crestor 20 mg daily  LDL 65  Current lipid lowering medication: Crestor 20  Resume statin at discharge  OSA  As per husband, patient had OSA evaluation 1 year ago, no need CPAP at home  Intermittent desaturation during sleep on this admission  Not CPAP candidate this time given agitation and combative behavior with restraints  Other Stroke Risk Factors  Former cigarette smoker - quit 30 years ago  ETOH use, advised to drink no more than 1 alcoholic beverage per day.  Obesity, Body mass index is 31.34 kg/m., recommend weight loss, diet and exercise as appropriate   Previous strokes by imaging - Small remote left thalamic and left cerebellar infarcts.  Other Active Problems  INR - 2.6 on admission - reversed -> 1.1  Anemia of chronic disease- 12.1  Leukocytosis - 21.7->20->10.2 (afebrile)  Agitation/encephalopathy - off precedex - avoid benzos/opiates  Hospital day # 4  This patient is critically ill due to [place], IVH, DKA, PAF and at significant risk of neurological worsening, death form recurrent bleeding, hydrocephalus, heart failure, DKA, seizure. This patient's care requires constant monitoring of vital signs, hemodynamics, respiratory and cardiac monitoring, review of multiple databases, neurological assessment, discussion with family, other specialists and medical decision making of high complexity. I had long discussion with husband [name] over the phone, updated pt current condition, treatment plan and potential prognosis. He expressed understanding and appreciation. I spent 40 minutes of neurocritical care time in the care of this patient.  [name], MD PhD Stroke Neurology 10/22/2018 11:10 AM   To contact Stroke Continuity provider, please refer to [email or web address] After hours, contact General Neurology

## 2018-10-22 NOTE — Consult Note (Signed)
Physical Medicine and Rehabilitation Consult   Reason for Consult: Functional decline. Referring Physician: Dr. Erlinda Hong   HPI: Tina [name] is a 53 y.o. female with history of T1DM, PAF-on coumadin, HTN, Turner syndrome, legally blind who was admitted on 10/18/18 with decrease in level of consciousness, low blood sugar with combativeness and refusal to answer questions.  History taken from chart review. She did not respond to oral glucose and CT of head at admission revealed right thalamic hemorrhage with intraventricular extension.  She received vitamin K and Kcentra.  MRI brain done and was limited by severe motion which showed an unchanged IPH and right thalamus.  Dr. Trenton Gammon consulted for input and recommended monitoring CT head for stability as well as Cleviprex drip to keep as BP below 140.  She continued to have bouts of confusion fluctuating with lethargy and was found to have decrease in movement on left side.  Follow-up CT reviewed, showing stable thalamic hemorrhage. She was kept n.p.o. due to dysphagia and developed DKA was treated with IV insulin.  She has had periods of apnea with hypoxia requiring CPAP.    She was started on Precedex by PCCM with improvement in agitation.  Dr.Xu felt that bleed likely due to hypertension in setting of Coumadin coagulopathy--to start ASA in 2 weeks?  As mentation improved and she was started on dysphagia 3, nectar liquids today. She continues to have issues with disorientation, impulsivity with poor safety awareness and decline in functional status.  CIR recommended follow-up therapy  Review of Systems  Unable to perform ROS: Mental acuity      Past Medical History:  Diagnosis Date   Arthritis    Cataract    Diabetic retinopathy ([place])    Diverticulosis    DM (diabetes mellitus), type 1 ([place])    Dyspnea    Elevated LFTs    Hyperlipidemia    Hypertension    Internal hemorrhoids    Legally blind in right eye, as defined in  [place]    Osteoporosis 11/2017   T score -3.8   Retinal detachment    Tachycardia    Tubular adenoma of colon    Turner's syndrome    Visual loss, bilateral    right eye light perception only and left eye 20/100    Past Surgical History:  Procedure Laterality Date   CATARACT EXTRACTION     CHOLECYSTECTOMY     Hepatic adenoma removed     RETINAL DETACHMENT SURGERY     right eye surgery      3 years ago   TONSILLECTOMY      Family History  Problem Relation Age of Onset   Cancer Father        kidney   Diabetes Maternal Grandmother    Emphysema Paternal Grandfather    Esophageal cancer Paternal Aunt    Colon cancer Neg Hx    Colon polyps Neg Hx    Rectal cancer Neg Hx    Stomach cancer Neg Hx     Social History: Married and independent for mobility prior to admission.  Per reports that she quit smoking about 30 years ago. Her smoking use included cigarettes. She has a 0.10 pack-year smoking history. She has never used smokeless tobacco. She reports current alcohol use. She reports that she does not use drugs.     Allergies  Allergen Reactions   Acetazolamide Anaphylaxis and Other (See Comments)     Reaction, drop in Blood pressure that caused patient to  stay in bed for 5 days, inconherient  Reaction, drop in Blood pressure that caused patient to stay in bed for 5 days, inconherient   Ace Inhibitors Cough    Medications Prior to Admission  Medication Sig Dispense Refill   diltiazem (CARDIZEM CD) 240 MG 24 hr capsule Take 1 capsule (240 mg total) by mouth daily. 90 capsule 3   Insulin Human (INSULIN PUMP) SOLN Inject 1 each into the skin continuous. Novolog 100 units/ml - basal rate 33 units/day and averaging 55 units/day with meals     losartan (COZAAR) 100 MG tablet Take 1 tablet (100 mg total) by mouth daily. 90 tablet 3   magnesium oxide (MAG-OX) 400 MG tablet Take 400 mg by mouth daily.     metoCLOPramide (REGLAN) 10 MG tablet TID AC and QHS  prn (Patient taking differently: Take 10 mg by mouth every 8 (eight) hours as needed for nausea. ) 120 tablet 1   rosuvastatin (CRESTOR) 20 MG tablet Take 20 mg by mouth daily.     warfarin (COUMADIN) 5 MG tablet Take as directed by [facility] (Patient taking differently: Take 5-7.5 mg by mouth See admin instructions. Take 5mg  only on Sundays, all other days take 7.5mg  (Mon-Sat).) 135 tablet 1   Continuous Blood Gluc Receiver (FREESTYLE LIBRE READER) DEVI by Does not apply route.     Continuous Blood Gluc Sensor ([place]) MISC by Does not apply route.     glucose blood (ONE TOUCH ULTRA TEST) test strip USE TO TEST 6 TO 8 TIMES DAILY AS NEEDED     glucose blood test strip USE TO TEST 6 TO 8 TIMES DAILY AS NEEDED     polyethylene glycol powder (GLYCOLAX/MIRALAX) powder Take 17 g by mouth daily. (Patient not taking: Reported on 10/18/2018) 1530 g 3   Vitamin D, Ergocalciferol, (DRISDOL) 50000 units CAPS capsule Take 1 capsule (50,000 Units total) by mouth every 7 (seven) days. (Patient not taking: Reported on 10/18/2018) 12 capsule 0    Home: Home Living Family/patient expects to be discharged to:: Private residence Living Arrangements: Spouse/significant other Available Help at Discharge: Family Type of Home: House Home Access: Stairs to enter Technical brewer of Steps: 3 Home Layout: One level Home Equipment: None Additional Comments: information taken from chart review admission 11/19  Lives With: Spouse  Functional History: Prior Function Comments: presumed independence prior to admission Functional Status:  Mobility: Bed Mobility Overal bed mobility: Needs Assistance Bed Mobility: Supine to Sit Rolling: Total assist, +2 for physical assistance Supine to sit: Min assist, HOB elevated General bed mobility comments: HOB 25 degrees with cues to elevate trunk and transition to sitting with increased time Transfers Overall transfer level: Needs  assistance Equipment used: Rolling walker (2 wheeled) Transfers: Sit to/from Stand Sit to Stand: Min assist Stand pivot transfers: Max assist, +2 physical assistance General transfer comment: min to slide to feet touching floor from EOB due to pt short stature. Cues and physical assist to place hands on RW. Min assist with cues to sit in chair and mod +2 assist to scoot fully back again due to leg length Ambulation/Gait Ambulation/Gait assistance: Min assist, +2 safety/equipment Gait Distance (Feet): 150 Feet Assistive device: Rolling walker (2 wheeled) Gait Pattern/deviations: Step-through pattern, Decreased stride length, Wide base of support General Gait Details: slow gait with assist to direct and control RW, cues for position in RW as pt too posterior, assist for lines and safety. Pt unable to recall room number or wayfind to  room Gait velocity interpretation: <1.8 ft/sec, indicate of risk for recurrent falls    ADL: ADL Overall ADL's : Needs assistance/impaired Grooming: Oral care, Maximal assistance, Sitting, Standing Grooming Details (indicate cue type and reason): Requiring Max cues. Max hand over hand for performance of oral care. Pt not initating or sequencing tasks.  Lower Body Dressing: Total assistance, Bed level Lower Body Dressing Details (indicate cue type and reason): donning socks Functional mobility during ADLs: Minimal assistance, +2 for safety/equipment General ADL Comments: Pt continues to present with poor cognition, initation, awareness, arousal, balance, and strength  Cognition: Cognition Overall Cognitive Status: Difficult to assess Arousal/Alertness: Awake/alert Orientation Level: Disoriented X4 Behaviors: Restless Cognition Arousal/Alertness: Awake/alert Behavior During Therapy: Flat affect Overall Cognitive Status: Difficult to assess Area of Impairment: Following commands, Attention, Memory, Problem solving Current Attention Level: Focused Memory:  Decreased short-term memory Following Commands: Follows one step commands inconsistently, Follows one step commands with increased time Problem Solving: Slow processing, Decreased initiation, Requires verbal cues, Requires tactile cues General Comments: pt with eyes open throughout session. When asked spouse's name she reports "jake". She was unable to state place, situation or name. Some responses to questions and activity like "wait a sec" "I'm fine" Difficult to assess due to: Level of arousal   Blood pressure (!) 106/56, pulse 71, temperature 99.1 F (37.3 C), temperature source Axillary, resp. rate 15, height 4\' 9"  (1.448 m), weight 65.7 kg, SpO2 99 %. Physical Exam  Nursing note and vitals reviewed. Constitutional: She appears well-developed.  Lying in bed with BIPAP.  Has waist belt and right mitten for safety.  Right inattention, does not follow voice  HENT:  Head: Normocephalic and atraumatic.  Eyes:  Pinpoint pupils.  Right gaze preference  Respiratory: Effort normal.  GI: She exhibits no distension.  Musculoskeletal:     Comments: No edema or tenderness in lower extremities  Neurological: She is alert.  Was lying in bed with eyes open but extremely delayed Does not follow any commands  Spontaneously moving b/l UE L>R  Skin: Skin is warm and dry.  Psychiatric: Her affect is blunt. Cognition and memory are impaired. She is noncommunicative.    Results for orders placed or performed during the hospital encounter of 10/18/18 (from the past 24 hour(s))  Glucose, capillary     Status: Abnormal   Collection Time: 10/21/18  3:35 PM  Result Value Ref Range   Glucose-Capillary 122 (H) 70 - 99 mg/dL   Comment 1 Notify RN    Comment 2 Document in Chart   Urine rapid drug screen (hosp performed)     Status: Abnormal   Collection Time: 10/21/18  4:40 PM  Result Value Ref Range   Opiates NONE DETECTED NONE DETECTED   Cocaine NONE DETECTED NONE DETECTED   Benzodiazepines  POSITIVE (A) NONE DETECTED   Amphetamines NONE DETECTED NONE DETECTED   Tetrahydrocannabinol NONE DETECTED NONE DETECTED   Barbiturates NONE DETECTED NONE DETECTED  Urinalysis, Routine w reflex microscopic     Status: Abnormal   Collection Time: 10/21/18  4:45 PM  Result Value Ref Range   Color, Urine YELLOW YELLOW   APPearance HAZY (A) CLEAR   Specific Gravity, Urine 1.020 1.005 - 1.030   pH 5.0 5.0 - 8.0   Glucose, UA 150 (A) NEGATIVE mg/dL   Hgb urine dipstick SMALL (A) NEGATIVE   Bilirubin Urine NEGATIVE NEGATIVE   Ketones, ur 20 (A) NEGATIVE mg/dL   Protein, ur 100 (A) NEGATIVE mg/dL   Nitrite NEGATIVE NEGATIVE  Leukocytes,Ua TRACE (A) NEGATIVE   RBC / HPF 0-5 0 - 5 RBC/hpf   WBC, UA 6-10 0 - 5 WBC/hpf   Bacteria, UA RARE (A) NONE SEEN  Glucose, capillary     Status: Abnormal   Collection Time: 10/21/18  8:05 PM  Result Value Ref Range   Glucose-Capillary 184 (H) 70 - 99 mg/dL  Glucose, capillary     Status: Abnormal   Collection Time: 10/21/18 11:44 PM  Result Value Ref Range   Glucose-Capillary 206 (H) 70 - 99 mg/dL  Glucose, capillary     Status: Abnormal   Collection Time: 10/22/18  3:33 AM  Result Value Ref Range   Glucose-Capillary 247 (H) 70 - 99 mg/dL  Basic metabolic panel     Status: Abnormal   Collection Time: 10/22/18  5:50 AM  Result Value Ref Range   Sodium 159 (H) 135 - 145 mmol/L   Potassium 4.3 3.5 - 5.1 mmol/L   Chloride 128 (H) 98 - 111 mmol/L   CO2 27 22 - 32 mmol/L   Glucose, Bld 335 (H) 70 - 99 mg/dL   BUN 35 (H) 6 - 20 mg/dL   Creatinine, Ser 1.54 (H) 0.44 - 1.00 mg/dL   Calcium 9.2 8.9 - 10.3 mg/dL   GFR calc non Af Amer 38 (L) >60 mL/min   GFR calc Af Amer 45 (L) >60 mL/min   Anion gap 4 (L) 5 - 15  CBC     Status: None   Collection Time: 10/22/18  5:50 AM  Result Value Ref Range   WBC 10.2 4.0 - 10.5 K/uL   RBC 4.01 3.87 - 5.11 MIL/uL   Hemoglobin 12.1 12.0 - 15.0 g/dL   HCT 38.4 36.0 - 46.0 %   MCV 95.8 80.0 - 100.0 fL   MCH 30.2  26.0 - 34.0 pg   MCHC 31.5 30.0 - 36.0 g/dL   RDW 14.7 11.5 - 15.5 %   Platelets 195 150 - 400 K/uL   nRBC 0.0 0.0 - 0.2 %  Magnesium     Status: None   Collection Time: 10/22/18  5:50 AM  Result Value Ref Range   Magnesium 2.4 1.7 - 2.4 mg/dL  Phosphorus     Status: None   Collection Time: 10/22/18  5:50 AM  Result Value Ref Range   Phosphorus 3.4 2.5 - 4.6 mg/dL  Lipid panel     Status: None   Collection Time: 10/22/18  5:50 AM  Result Value Ref Range   Cholesterol 131 0 - 200 mg/dL   Triglycerides 119 <150 mg/dL   HDL 42 >40 mg/dL   Total CHOL/HDL Ratio 3.1 RATIO   VLDL 24 0 - 40 mg/dL   LDL Cholesterol 65 0 - 99 mg/dL  Hemoglobin A1c     Status: Abnormal   Collection Time: 10/22/18  5:50 AM  Result Value Ref Range   Hgb A1c MFr Bld 6.6 (H) 4.8 - 5.6 %   Mean Plasma Glucose 142.72 mg/dL  Glucose, capillary     Status: Abnormal   Collection Time: 10/22/18  7:50 AM  Result Value Ref Range   Glucose-Capillary 304 (H) 70 - 99 mg/dL   Comment 1 Notify RN    Comment 2 Document in Chart   Glucose, capillary     Status: Abnormal   Collection Time: 10/22/18 12:14 PM  Result Value Ref Range   Glucose-Capillary 345 (H) 70 - 99 mg/dL   Comment 1 Notify RN    Comment  2 Document in Chart    Ct Head Wo Contrast  Result Date: 10/22/2018 CLINICAL DATA:  Follow-up intracranial hemorrhage EXAM: CT HEAD WITHOUT CONTRAST TECHNIQUE: Contiguous axial images were obtained from the base of the skull through the vertex without intravenous contrast. COMPARISON:  Two days ago FINDINGS: Brain: Unchanged hematoma in the right thalamus measuring up to 19 mm anterior-posterior. Intraventricular hemorrhage in the left more than right lateral ventricles with temporal horn dilatation and mild periventricular low-density on the left. Remote small vessel infarct in the left cerebellum and left thalamus. No evident infarct or shift. Vascular: No hyperdense vessel or unexpected calcification. Skull: No acute or  aggressive finding Sinuses/Orbits: Left cataract resection and right ocular prosthesis. IMPRESSION: Unchanged right thalamic and intraventricular clot which dilates the temporal horn of the left lateral ventricle. Electronically Signed   By: Monte Fantasia M.D.   On: 10/22/2018 04:09   Dg Chest Port 1 View  Result Date: 10/20/2018 CLINICAL DATA:  53 year old female with respiratory failure, acute intracranial hemorrhage EXAM: PORTABLE CHEST 1 VIEW COMPARISON:  10/18/2018 FINDINGS: Cardiomediastinal silhouette unchanged in size and contour. Mild fullness in the central vasculature. Coarsened interstitial markings bilaterally without confluent airspace disease. No pneumothorax. No pleural effusion. No displaced fracture. IMPRESSION: Low lung volumes with likely atelectasis/chronic changes without evidence of lobar pneumonia or edema Electronically Signed   By: Corrie Mckusick D.O.   On: 10/20/2018 17:04    Assessment/Plan: Diagnosis: Right thalamic hemorrhage Labs and images (see above) independently reviewed.  Records reviewed and summated above.  1. Does the need for close, 24 hr/day medical supervision in concert with the patient's rehab needs make it unreasonable for this patient to be served in a less intensive setting? Yes  2. Co-Morbidities requiring supervision/potential complications: T1 DM with DKA (Monitor in accordance with exercise and adjust meds as necessary), PAF (monitor HR with increased activity, cont meds), HTN (monitor and provide prns in accordance with increased physical exertion and pain), Turner syndrome, legally blind, dysphagia post-stroke (advance diet as tolerated), tobacco abuse (counsel) 3. Due to bladder management, safety, disease management, medication administration and patient education, does the patient require 24 hr/day rehab nursing? Yes 4. Does the patient require coordinated care of a physician, rehab nurse, PT (1-2 hrs/day, 5 days/week), OT (1-2 hrs/day, 5  days/week) and SLP (1-2 hrs/day, 5 days/week) to address physical and functional deficits in the context of the above medical diagnosis(es)? Yes Addressing deficits in the following areas: balance, endurance, locomotion, strength, transferring, bathing, dressing, toileting, cognition, speech, language, swallowing and psychosocial support 5. Can the patient actively participate in an intensive therapy program of at least 3 hrs of therapy per day at least 5 days per week? Potentially 6. The potential for patient to make measurable gains while on inpatient rehab is excellent and good 7. Anticipated functional outcomes upon discharge from inpatient rehab are supervision  with PT, supervision with OT, supervision and min assist with SLP. 8. Estimated rehab length of stay to reach the above functional goals is: 15-18 days. 9. Anticipated D/C setting: Home 10. Anticipated post D/C treatments: HH therapy and Home excercise program 11. Overall Rehab/Functional Prognosis: good  RECOMMENDATIONS: This patient's condition is appropriate for continued rehabilitative care in the following setting: CIR when medically stable and able to tolerate 3 hours of therapy/day if caregiver support available. Patient has agreed to participate in recommended program. Potentially Note that insurance prior authorization may be required for reimbursement for recommended care.  Comment: Rehab Admissions Coordinator to follow  up.   I have personally performed a face to face diagnostic evaluation, including, but not limited to relevant history and physical exam findings, of this patient and developed relevant assessment and plan.  Additionally, I have reviewed and concur with the physician assistant's documentation above.   Delice Lesch, MD, ABPMR Bary Leriche, PA-C 10/22/2018

## 2018-10-22 NOTE — Progress Notes (Signed)
NAME:  Tina [name], MRN:  [id number], DOB:  02-Aug-1965, LOS: 4 ADMISSION DATE:  10/18/2018, CONSULTATION DATE:  10/20/2018 REFERRING MD:  Dr Erlinda Hong, CHIEF COMPLAINT:  DKA   Brief History   53 year old obese female with hx of OSA on CPAP at hoe and typle 1 DM. Admitted 10/18/2018 for Intracranial bleed . Course complicated by agitation and compounded by nPO status, develolping DKA and inability  to do biPap / cpap and also gettting benzo.opioid for procedures such as imaging brain. PCCM called as she is on 6L [place] - 98% (baseline -  ? On o2) And also DKA  Past Medical History   Past Medical History:  Diagnosis Date  . Arthritis   . Cataract   . Diabetic retinopathy ([place])   . Diverticulosis   . DM (diabetes mellitus), type 1 ([place])   . Dyspnea   . Elevated LFTs   . Hyperlipidemia   . Hypertension   . Internal hemorrhoids   . Legally blind in right eye, as defined in [place]   . Osteoporosis 11/2017   T score -3.8  . Retinal detachment   . Tachycardia   . Tubular adenoma of colon   . Turner's syndrome   . Visual loss, bilateral    right eye light perception only and left eye 20/100   Significant Hospital Events   10/18/2018= admit 4/5 - DKA ccm consult  Consults:  4/5 - ccm consult Procedures:    Significant Diagnostic Tests:  Chest x-ray on 10/20/2018-basal atelectasis  Antimicrobials:   Interim history/subjective:  No overnight events  Objective   Blood pressure (!) 106/56, pulse 71, temperature 98.6 F (37 C), temperature source Axillary, resp. rate 15, height 4\' 9"  (1.448 m), weight 65.7 kg, SpO2 99 %.        Intake/Output Summary (Last 24 hours) at 10/22/2018 0850 Last data filed at 10/22/2018 0700 Gross per 24 hour  Intake 1419.76 ml  Output 1150 ml  Net 269.76 ml   Filed Weights   10/18/18 1104 10/18/18 1700  Weight: 68.5 kg 65.7 kg   Examination: Middle-age lady, not in distress Moist oral mucosa Clear breath sounds bilaterally S1-S2 appreciated Soft  nontender abdomen Extremities shows no edema, no clubbing She is alert, she does follow commands Fair urine output  Ambulated in the hallway this morning with physical therapy  Resolved Hospital Problem list     Assessment & Plan:  DKA Started on a diet She is on Lantus Sugars running in the 300s We will adjust Lantus and coverage  Encephalopathy, multifactorial Avoid benzodiazepines/opiates   Acute respiratory failure Oxygen supplementation as needed Continue to wean as tolerated  She does carry a history of obstructive sleep apnea I did review her records Last time she saw Dr. Elsworth Soho in the office in 2015-study was negative for significant sleep disordered breathing She is noted to be having apneic episodes which may be occurring because of the recent CVA We will trial CPAP therapy, pressure of 10.  This is not to be used if patient is restrained or uncooperative  Intracerebral hemorrhage Stable on CT Neurology following  Leukocytosis Continue to trend CBC  Acute kidney injury Trend electrolytes Improving    Best practice:  Diet: N.p.o. Pain/Anxiety/Delirium protocol (if indicated): Precedex VAP protocol (if indicated): DVT prophylaxis: SCD GI prophylaxis: Glucose control: By protocol Mobility: Bedrest Code Status: Full code Family Communication: No family at bedside Disposition: ICU  Labs   CBC: Recent Labs  Lab 10/18/18 1112 10/20/18 0356  10/20/18 1154 10/21/18 0528 10/22/18 0550  WBC  --  21.7*  --  20.0* 10.2  HGB 12.9 12.8 10.9* 11.9* 12.1  HCT 38.0 42.9 32.0* 37.1 38.4  MCV  --  101.7*  --  95.4 95.8  PLT  --  247  --  249 245    Basic Metabolic Panel: Recent Labs  Lab 10/18/18 1050  10/20/18 1447 10/20/18 1908 10/20/18 2235 10/21/18 0528 10/22/18 0550  NA 143   < > 154* 155* 150* 154* 159*  K 4.3   < > 4.4 3.9 4.1 4.2 4.3  CL 109   < > 124* 124* 119* 123* 128*  CO2 23   < > 18* 20* 17* 16* 27  GLUCOSE 180*   < > 169* 201* 232*  266* 335*  BUN 24*   < > 45* 48* 47* 48* 35*  CREATININE 1.36*   < > 1.87* 2.08* 2.10* 2.11* 1.54*  CALCIUM 9.2   < > 9.3 9.3 9.1 9.3 9.2  MG 2.3  --   --   --   --  2.4 2.4  PHOS 4.4  --   --   --   --  3.9 3.4   < > = values in this interval not displayed.   GFR: Estimated Creatinine Clearance: 33.3 mL/min (A) (by C-G formula based on SCr of 1.54 mg/dL (H)). Recent Labs  Lab 10/18/18 1050 10/20/18 0356 10/20/18 2235 10/21/18 0528 10/22/18 0550  WBC  --  21.7*  --  20.0* 10.2  LATICACIDVEN 1.4  --  1.4  --   --     Liver Function Tests: Recent Labs  Lab 10/18/18 1050 10/21/18 0528  AST 42* 61*  ALT 69* 70*  ALKPHOS 126 99  BILITOT 1.1 1.5*  PROT 6.4* 6.1*  ALBUMIN 3.2* 3.1*   Recent Labs  Lab 10/18/18 1050  LIPASE 20   Recent Labs  Lab 10/18/18 1050  AMMONIA 9    ABG    Component Value Date/Time   PHART 7.415 10/20/2018 1154   PCO2ART 33.5 10/20/2018 1154   PO2ART 64.0 (L) 10/20/2018 1154   HCO3 21.5 10/20/2018 1154   TCO2 22 10/20/2018 1154   ACIDBASEDEF 2.0 10/20/2018 1154   O2SAT 92.0 10/20/2018 1154     Coagulation Profile: Recent Labs  Lab 10/18/18 1413 10/18/18 2213 10/19/18 0246 10/20/18 0356 10/21/18 0528  INR 1.0 1.2 1.1 1.1 1.1    Cardiac Enzymes: Recent Labs  Lab 10/18/18 1050  TROPONINI 0.03*    HbA1C: Hgb A1c MFr Bld  Date/Time Value Ref Range Status  10/22/2018 05:50 AM 6.6 (H) 4.8 - 5.6 % Final    Comment:    (NOTE) Pre diabetes:          5.7%-6.4% Diabetes:              >6.4% Glycemic control for   <7.0% adults with diabetes   06/13/2018 05:59 AM 5.7 (H) 4.8 - 5.6 % Final    Comment:    (NOTE) Pre diabetes:          5.7%-6.4% Diabetes:              >6.4% Glycemic control for   <7.0% adults with diabetes     CBG: Recent Labs  Lab 10/21/18 1152 10/21/18 2005 10/21/18 2344 10/22/18 0333 10/22/18 0750  GLUCAP 187* 184* 206* 247* 304*    Review of Systems:   Unobtainable secondary to mental status   Past Medical History  She,  has a past medical history of Arthritis, Cataract, Diabetic retinopathy ([place]), Diverticulosis, DM (diabetes mellitus), type 1 ([place]), Dyspnea, Elevated LFTs, Hyperlipidemia, Hypertension, Internal hemorrhoids, Legally blind in right eye, as defined in [place], Osteoporosis (11/2017), Retinal detachment, Tachycardia, Tubular adenoma of colon, Turner's syndrome, and Visual loss, bilateral.   Surgical History    Past Surgical History:  Procedure Laterality Date  . CATARACT EXTRACTION    . CHOLECYSTECTOMY    . Hepatic adenoma removed    . RETINAL DETACHMENT SURGERY    . right eye surgery      3 years ago  . TONSILLECTOMY       Social History   reports that she quit smoking about 30 years ago. Her smoking use included cigarettes. She has a 0.10 pack-year smoking history. She has never used smokeless tobacco. She reports current alcohol use. She reports that she does not use drugs.   Family History   Her family history includes Cancer in her father; Diabetes in her maternal grandmother; Emphysema in her paternal grandfather; Esophageal cancer in her paternal aunt. There is no history of Colon cancer, Colon polyps, Rectal cancer, or Stomach cancer.   Allergies Allergies  Allergen Reactions  . Acetazolamide Anaphylaxis and Other (See Comments)     Reaction, drop in Blood pressure that caused patient to stay in bed for 5 days, inconherient  Reaction, drop in Blood pressure that caused patient to stay in bed for 5 days, inconherient  . Ace Inhibitors Cough    Critical care time spent evaluating patient, reviewing records formulating plan of care 30 minutes, discussed with Dr. Erlinda Hong

## 2018-10-22 NOTE — Progress Notes (Signed)
Physical Therapy Treatment Patient Details Name: Tina [name] MRN: [id number] DOB: Jun 09, 1966 Today's Date: 10/22/2018    History of Present Illness 53 year old female admitted 10/18/2018 with AMS.  Head CT = right thalamic hemorrhage with intraventricular extension. Most of the blood is in the left lateral ventricle. PMHx: DM, HTN, Paroxysmal A Fib, Turner syndrome, legal blindness.    PT Comments    Pt with improved eye opening, mobility and command following today. PT continues to have limited verbal responses to questions, impaired problem solving and awareness with assist for all mobility, gait and control of RW. Pt with continued activity progression and would benefit from CIR to return home with spouse. Pt on 2L at rest with SpO2 97% pt dropping to 85% during gait but on 4L maintained >90% SPO2 during activity.     Follow Up Recommendations  CIR;Supervision/Assistance - 24 hour     Equipment Recommendations  Rolling walker with 5" wheels    Recommendations for Other Services Rehab consult     Precautions / Restrictions Precautions Precautions: Fall    Mobility  Bed Mobility Overal bed mobility: Needs Assistance Bed Mobility: Supine to Sit     Supine to sit: Min assist;HOB elevated     General bed mobility comments: HOB 25 degrees with cues to elevate trunk and transition to sitting with increased time  Transfers Overall transfer level: Needs assistance   Transfers: Sit to/from Stand Sit to Stand: Min assist         General transfer comment: min to slide to feet touching floor from EOB due to pt short stature. Cues and physical assist to place hands on RW. Min assist with cues to sit in chair and mod +2 assist to scoot fully back again due to leg length  Ambulation/Gait Ambulation/Gait assistance: Min assist;+2 safety/equipment Gait Distance (Feet): 150 Feet Assistive device: Rolling walker (2 wheeled) Gait Pattern/deviations: Step-through  pattern;Decreased stride length;Wide base of support   Gait velocity interpretation: <1.8 ft/sec, indicate of risk for recurrent falls General Gait Details: slow gait with assist to direct and control RW, cues for position in RW as pt too posterior, assist for lines and safety. Pt unable to recall room number or wayfind to room   Stairs             Wheelchair Mobility    Modified Rankin (Stroke Patients Only) Modified Rankin (Stroke Patients Only) Pre-Morbid Rankin Score: No symptoms Modified Rankin: Moderately severe disability     Balance Overall balance assessment: Needs assistance Sitting-balance support: Bilateral upper extremity supported Sitting balance-Leahy Scale: Fair Sitting balance - Comments: no UE assist sitting eOB able to be minguard for stability and safety   Standing balance support: During functional activity Standing balance-Leahy Scale: Poor Standing balance comment: requires bilUE assist                            Cognition Arousal/Alertness: Awake/alert Behavior During Therapy: Flat affect Overall Cognitive Status: Difficult to assess Area of Impairment: Following commands;Attention;Memory;Problem solving                   Current Attention Level: Focused Memory: Decreased short-term memory Following Commands: Follows one step commands inconsistently;Follows one step commands with increased time     Problem Solving: Slow processing;Decreased initiation;Requires verbal cues;Requires tactile cues General Comments: pt with eyes open throughout session. When asked spouse's name she reports "jake". She was unable to state place, situation or name.  Some responses to questions and activity like "wait a sec" "I'm fine"      Exercises      General Comments        Pertinent Vitals/Pain Pain Assessment: No/denies pain    Home Living                      Prior Function            PT Goals (current goals can now  be found in the care plan section) Progress towards PT goals: Progressing toward goals    Frequency           PT Plan Discharge plan needs to be updated    Co-evaluation              AM-PAC PT "6 Clicks" Mobility   Outcome Measure  Help needed turning from your back to your side while in a flat bed without using bedrails?: A Little Help needed moving from lying on your back to sitting on the side of a flat bed without using bedrails?: A Little Help needed moving to and from a bed to a chair (including a wheelchair)?: A Little Help needed standing up from a chair using your arms (e.g., wheelchair or bedside chair)?: A Little Help needed to walk in hospital room?: A Little Help needed climbing 3-5 steps with a railing? : A Lot 6 Click Score: 17    End of Session Equipment Utilized During Treatment: Gait belt;Oxygen Activity Tolerance: Patient tolerated treatment well Patient left: in chair;with call bell/phone within reach;with chair alarm set;with restraints reapplied Nurse Communication: Mobility status PT Visit Diagnosis: Other symptoms and signs involving the nervous system (R29.898);Other abnormalities of gait and mobility (R26.89)     Time: 9357-0177 PT Time Calculation (min) (ACUTE ONLY): 22 min  Charges:  $Gait Training: 8-22 mins                     [place], PT Acute Rehabilitation Services Pager: [phone] Office: [phone]    Sandy Salaam Neidy Guerrieri 10/22/2018, 9:40 AM

## 2018-10-22 NOTE — Progress Notes (Signed)
Pt placed on CPAP due to desat episode while sleeping.  RN instructed on proper use of machine.  Pt is compliant at this time and currently resting comfortably.

## 2018-10-22 NOTE — Progress Notes (Signed)
  Speech Language Pathology Treatment: Dysphagia  Patient Details Name: Tina [name] MRN: [id number] DOB: [birth year] Today's Date: 10/22/2018 Time: 5456-2563 SLP Time Calculation (min) (ACUTE ONLY): 35 min  Assessment / Plan / Recommendation Clinical Impression  Pt seen for dysphagia tx to assess tolerance of dys 1 diet w/ thin liquids. Pt was verbal and asking for water constantly. Pt tolerated solid consistencies without s/s of aspiration. Mastication appeared appropriate. Intermittent coughing observed when Pt was drinking thin and nectar thick liquids via straw. Pt tended to gulp liquids and required max cueing to slow rate and take single sips. No coughing observed when taking sips of nectar thick liquids. Concern for aspiration due to Pt's gulping of liquids and frequent coughing. Recommend dys 3 with nectar thick liquids. Will follow up to assess diet tolerance.    HPI HPI: 53 year old female admitted 10/18/2018 with AMS. PMH: DM, HTN, Paroxysmal A Fib, Turner syndrome, legal blindness. Head CT = right thalamic hemorrhage with intraventricular extension. Most of the blood is in the left lateral ventricle.      SLP Plan          Recommendations  Diet recommendations: Dysphagia 3 (mechanical soft);Nectar-thick liquid Liquids provided via: Straw;Teaspoon(use teaspoon if coughing w/ straw) Medication Administration: Whole meds with liquid Supervision: Full supervision/cueing for compensatory strategies Compensations: Minimize environmental distractions;Slow rate;Small sips/bites Postural Changes and/or Swallow Maneuvers: Seated upright 90 degrees                Oral Care Recommendations: Oral care BID SLP Visit Diagnosis: Dysphagia, unspecified (R13.10)       GO                Tina [name] H Zubin Pontillo 10/22/2018, 10:11 AM  Melody Haver, M.Ed., [facility] [phone]

## 2018-10-22 NOTE — Progress Notes (Addendum)
Inpatient Diabetes Program Recommendations  AACE/ADA: New Consensus Statement on Inpatient Glycemic Control (2015)  Target Ranges:  Prepandial:   less than 140 mg/dL      Peak postprandial:   less than 180 mg/dL (1-2 hours)      Critically ill patients:  140 - 180 mg/dL   Lab Results  Component Value Date   GLUCAP 304 (H) 10/22/2018   HGBA1C 6.6 (H) 10/22/2018    Review of Glycemic Control Results for BRENDALIZ, KUK (MRN [id number]) as of 10/22/2018 10:12  Ref. Range 10/21/2018 23:44 10/22/2018 03:33 10/22/2018 07:50  Glucose-Capillary Latest Ref Range: 70 - 99 mg/dL 206 (H) 247 (H) 304 (H)   Diabetes history: Type 1 DM Outpatient Diabetes medications: insulin pump Current orders for Inpatient glycemic control: Novolog 0-15 units TID, Novolog 0-5 QHS, Lantus 20 units QD  Inpatient Diabetes Program Recommendations:    Noticed Am trend was exceeding 300's mg/dL and subsequent order changes, however patient on half of home basal insulin dose. Could benefit from BID dosing.  Consider Lantus 12 units BID.  Additionally, patient received 11 units of Novolog this AM. Per endo, 1 unit could drop patient 36 pts. Therefore, patient may be low this afternoon. Consider decreasing to Novolog 0-9 units TID.   Spoke with Margaretha Sheffield, RN. Patient eating close to 50% of meals, however working towards that goal. Will need meal coverage once eating more consistently. Consider Novolog 4 units TID (asusming patient is consuming >50%).  Text paged Md recs.     Thanks, Bronson Curb, MSN, RNC-OB Diabetes Coordinator [phone] (8a-5p)

## 2018-10-23 ENCOUNTER — Inpatient Hospital Stay (HOSPITAL_COMMUNITY): Payer: HMO

## 2018-10-23 ENCOUNTER — Other Ambulatory Visit: Payer: Self-pay

## 2018-10-23 DIAGNOSIS — R451 Restlessness and agitation: Secondary | ICD-10-CM

## 2018-10-23 DIAGNOSIS — R1312 Dysphagia, oropharyngeal phase: Secondary | ICD-10-CM

## 2018-10-23 LAB — POCT I-STAT 7, (LYTES, BLD GAS, ICA,H+H)
Acid-Base Excess: 1 mmol/L (ref 0.0–2.0)
Bicarbonate: 25.4 mmol/L (ref 20.0–28.0)
Calcium, Ion: 1.32 mmol/L (ref 1.15–1.40)
HCT: 31 % — ABNORMAL LOW (ref 36.0–46.0)
Hemoglobin: 10.5 g/dL — ABNORMAL LOW (ref 12.0–15.0)
O2 Saturation: 96 %
Patient temperature: 98.6
Potassium: 3.3 mmol/L — ABNORMAL LOW (ref 3.5–5.1)
Sodium: 152 mmol/L — ABNORMAL HIGH (ref 135–145)
TCO2: 27 mmol/L (ref 22–32)
pCO2 arterial: 38.5 mmHg (ref 32.0–48.0)
pH, Arterial: 7.428 (ref 7.350–7.450)
pO2, Arterial: 78 mmHg — ABNORMAL LOW (ref 83.0–108.0)

## 2018-10-23 LAB — CULTURE, BLOOD (ROUTINE X 2)
Culture: NO GROWTH
Special Requests: ADEQUATE

## 2018-10-23 LAB — CBC
HCT: 35.3 % — ABNORMAL LOW (ref 36.0–46.0)
Hemoglobin: 10.8 g/dL — ABNORMAL LOW (ref 12.0–15.0)
MCH: 29.1 pg (ref 26.0–34.0)
MCHC: 30.6 g/dL (ref 30.0–36.0)
MCV: 95.1 fL (ref 80.0–100.0)
Platelets: 142 10*3/uL — ABNORMAL LOW (ref 150–400)
RBC: 3.71 MIL/uL — ABNORMAL LOW (ref 3.87–5.11)
RDW: 14.1 % (ref 11.5–15.5)
WBC: 7.3 10*3/uL (ref 4.0–10.5)
nRBC: 0 % (ref 0.0–0.2)

## 2018-10-23 LAB — GLUCOSE, CAPILLARY
Glucose-Capillary: 237 mg/dL — ABNORMAL HIGH (ref 70–99)
Glucose-Capillary: 180 mg/dL — ABNORMAL HIGH (ref 70–99)
Glucose-Capillary: 113 mg/dL — ABNORMAL HIGH (ref 70–99)
Glucose-Capillary: 68 mg/dL — ABNORMAL LOW (ref 70–99)
Glucose-Capillary: 77 mg/dL (ref 70–99)
Glucose-Capillary: 148 mg/dL — ABNORMAL HIGH (ref 70–99)

## 2018-10-23 LAB — BASIC METABOLIC PANEL
Anion gap: 8 (ref 5–15)
BUN: 20 mg/dL (ref 6–20)
CO2: 23 mmol/L (ref 22–32)
Calcium: 8.6 mg/dL — ABNORMAL LOW (ref 8.9–10.3)
Chloride: 116 mmol/L — ABNORMAL HIGH (ref 98–111)
Creatinine, Ser: 1.3 mg/dL — ABNORMAL HIGH (ref 0.44–1.00)
GFR calc Af Amer: 55 mL/min — ABNORMAL LOW (ref >60)
GFR calc non Af Amer: 47 mL/min — ABNORMAL LOW (ref >60)
Glucose, Bld: 356 mg/dL — ABNORMAL HIGH (ref 70–99)
Potassium: 3.5 mmol/L (ref 3.5–5.1)
Sodium: 147 mmol/L — ABNORMAL HIGH (ref 135–145)

## 2018-10-23 LAB — MAGNESIUM: Magnesium: 2.3 mg/dL (ref 1.7–2.4)

## 2018-10-23 LAB — PHOSPHORUS: Phosphorus: 2.8 mg/dL (ref 2.5–4.6)

## 2018-10-23 MED ORDER — INSULIN ASPART 100 UNIT/ML ~~LOC~~ SOLN
0.0000 [IU] | SUBCUTANEOUS | Status: DC
  Administered 2018-10-23: 1 [IU] via SUBCUTANEOUS
  Administered 2018-10-24: 3 [IU] via SUBCUTANEOUS
  Administered 2018-10-24 (×2): 2 [IU] via SUBCUTANEOUS
  Administered 2018-10-24: 16:00:00 9 [IU] via SUBCUTANEOUS
  Administered 2018-10-24: 01:00:00 3 [IU] via SUBCUTANEOUS
  Administered 2018-10-25: 1 [IU] via SUBCUTANEOUS
  Administered 2018-10-25: 21:00:00 5 [IU] via SUBCUTANEOUS
  Administered 2018-10-25: 3 [IU] via SUBCUTANEOUS
  Administered 2018-10-25: 5 [IU] via SUBCUTANEOUS
  Administered 2018-10-25: 1 [IU] via SUBCUTANEOUS
  Administered 2018-10-26 (×2): 2 [IU] via SUBCUTANEOUS
  Administered 2018-10-26: 20:00:00 7 [IU] via SUBCUTANEOUS
  Administered 2018-10-27: 01:00:00 3 [IU] via SUBCUTANEOUS
  Administered 2018-10-27: 2 [IU] via SUBCUTANEOUS
  Administered 2018-10-27 (×2): 1 [IU] via SUBCUTANEOUS
  Administered 2018-10-28 (×2): 2 [IU] via SUBCUTANEOUS
  Administered 2018-10-28: 1 [IU] via SUBCUTANEOUS
  Administered 2018-10-28: 5 [IU] via SUBCUTANEOUS
  Administered 2018-10-28: 2 [IU] via SUBCUTANEOUS
  Administered 2018-10-29 (×4): 3 [IU] via SUBCUTANEOUS
  Administered 2018-10-29: 11:00:00 2 [IU] via SUBCUTANEOUS
  Administered 2018-10-30: 1 [IU] via SUBCUTANEOUS
  Administered 2018-10-30: 17:00:00 7 [IU] via SUBCUTANEOUS
  Administered 2018-10-30 (×2): 2 [IU] via SUBCUTANEOUS
  Administered 2018-10-30: 3 [IU] via SUBCUTANEOUS
  Administered 2018-10-31: 7 [IU] via SUBCUTANEOUS
  Administered 2018-10-31: 2 [IU] via SUBCUTANEOUS
  Administered 2018-10-31: 1 [IU] via SUBCUTANEOUS
  Administered 2018-10-31: 7 [IU] via SUBCUTANEOUS
  Administered 2018-10-31: 2 [IU] via SUBCUTANEOUS
  Administered 2018-11-01 (×2): 5 [IU] via SUBCUTANEOUS
  Administered 2018-11-01: 21:00:00 3 [IU] via SUBCUTANEOUS
  Administered 2018-11-01 – 2018-11-02 (×3): 2 [IU] via SUBCUTANEOUS
  Administered 2018-11-02: 5 [IU] via SUBCUTANEOUS
  Administered 2018-11-02: 2 [IU] via SUBCUTANEOUS
  Administered 2018-11-02 – 2018-11-03 (×3): 3 [IU] via SUBCUTANEOUS
  Administered 2018-11-03 (×2): 1 [IU] via SUBCUTANEOUS
  Administered 2018-11-03: 2 [IU] via SUBCUTANEOUS
  Administered 2018-11-03 – 2018-11-04 (×3): 1 [IU] via SUBCUTANEOUS
  Administered 2018-11-04: 2 [IU] via SUBCUTANEOUS

## 2018-10-23 MED ORDER — DEXTROSE 50 % IV SOLN
INTRAVENOUS | Status: AC
  Administered 2018-10-23: 17:00:00 25 mL
  Filled 2018-10-23: qty 50

## 2018-10-23 MED ORDER — INSULIN GLARGINE 100 UNIT/ML ~~LOC~~ SOLN
7.0000 [IU] | Freq: Two times a day (BID) | SUBCUTANEOUS | Status: DC
  Administered 2018-10-23 – 2018-10-24 (×2): 7 [IU] via SUBCUTANEOUS
  Filled 2018-10-23 (×4): qty 0.07

## 2018-10-23 MED ORDER — CLEVIDIPINE BUTYRATE 0.5 MG/ML IV EMUL
0.0000 mg/h | INTRAVENOUS | Status: DC
  Administered 2018-10-24: 8 mg/h via INTRAVENOUS
  Administered 2018-10-24: 6 mg/h via INTRAVENOUS
  Administered 2018-10-24: 1 mg/h via INTRAVENOUS
  Administered 2018-10-24 – 2018-10-25 (×3): 8 mg/h via INTRAVENOUS
  Administered 2018-10-25: 18:00:00 2 mg/h via INTRAVENOUS
  Administered 2018-10-25: 09:00:00 4 mg/h via INTRAVENOUS
  Administered 2018-10-26: 8 mg/h via INTRAVENOUS
  Administered 2018-10-26: 6 mg/h via INTRAVENOUS
  Administered 2018-10-27: 09:00:00 9 mg/h via INTRAVENOUS
  Administered 2018-10-27: 01:00:00 5 mg/h via INTRAVENOUS
  Administered 2018-10-27: 6 mg/h via INTRAVENOUS
  Administered 2018-10-27: 4 mg/h via INTRAVENOUS
  Filled 2018-10-23 (×2): qty 50
  Filled 2018-10-23: qty 100
  Filled 2018-10-23 (×3): qty 50
  Filled 2018-10-23: qty 100
  Filled 2018-10-23 (×7): qty 50

## 2018-10-23 MED ORDER — LABETALOL HCL 5 MG/ML IV SOLN
10.0000 mg | INTRAVENOUS | Status: AC | PRN
  Administered 2018-10-23 – 2018-10-24 (×2): 10 mg via INTRAVENOUS
  Filled 2018-10-23 (×2): qty 4

## 2018-10-23 MED ORDER — INSULIN GLARGINE 100 UNIT/ML ~~LOC~~ SOLN
15.0000 [IU] | Freq: Two times a day (BID) | SUBCUTANEOUS | Status: DC
  Filled 2018-10-23 (×2): qty 0.15

## 2018-10-23 MED ORDER — FAMOTIDINE 20 MG PO TABS
20.0000 mg | ORAL_TABLET | Freq: Every day | ORAL | Status: DC
  Administered 2018-10-23 – 2018-11-02 (×6): 20 mg via ORAL
  Filled 2018-10-23 (×9): qty 1

## 2018-10-23 NOTE — Progress Notes (Addendum)
Pt has become progressively sleep throughout the shift. Increased episodes of apnea while sleeping, decreasing O2 sats. Spoke with MD [name] with neurology and MD Sherrilyn Rist about progressive sleepiness and apnic episodes while sleeping. Will continue nasal cannula at this time, per MD Olalere, as pt is restrained for safety at this time. VBG ordered.

## 2018-10-23 NOTE — Progress Notes (Signed)
STROKE TEAM PROGRESS NOTE   SUBJECTIVE (INTERVAL HISTORY) Pt sitting in bed. Still has OSA when goes to sleep. While awake, she is more awake alert and conversing well. She is asking for water and orientated to self, age and place. However, still combative when giving insulin subq. Concerning for silent aspiration, will go for MBS.    OBJECTIVE Vitals:   10/23/18 0500 10/23/18 0600 10/23/18 0700 10/23/18 0800  BP: 140/77 (!) 150/66 (!) 147/71   Pulse: 80 78 79   Resp:   18   Temp:    99 F (37.2 C)  TempSrc:    Axillary  SpO2: 97% 96% 100%   Weight:      Height:        CBC:  Recent Labs  Lab 10/21/18 0528 10/22/18 0550  WBC 20.0* 10.2  HGB 11.9* 12.1  HCT 37.1 38.4  MCV 95.4 95.8  PLT 249 161    Basic Metabolic Panel:  Recent Labs  Lab 10/21/18 0528 10/22/18 0550 10/23/18 0303  NA 154* 159*  --   K 4.2 4.3  --   CL 123* 128*  --   CO2 16* 27  --   GLUCOSE 266* 335*  --   BUN 48* 35*  --   CREATININE 2.11* 1.54*  --   CALCIUM 9.3 9.2  --   MG 2.4 2.4 2.3  PHOS 3.9 3.4 2.8    Lipid Panel:     Component Value Date/Time   CHOL 131 10/22/2018 0550   TRIG 119 10/22/2018 0550   HDL 42 10/22/2018 0550   CHOLHDL 3.1 10/22/2018 0550   VLDL 24 10/22/2018 0550   LDLCALC 65 10/22/2018 0550   HgbA1c:  Lab Results  Component Value Date   HGBA1C 6.6 (H) 10/22/2018   Urine Drug Screen:     Component Value Date/Time   LABOPIA NONE DETECTED 10/21/2018 1640   COCAINSCRNUR NONE DETECTED 10/21/2018 1640   LABBENZ POSITIVE (A) 10/21/2018 1640   AMPHETMU NONE DETECTED 10/21/2018 1640   THCU NONE DETECTED 10/21/2018 1640   LABBARB NONE DETECTED 10/21/2018 1640    Alcohol Level     Component Value Date/Time   ETH <10 10/18/2018 1050    IMAGING Ct Head Wo Contrast 10/18/2018 IMPRESSION:  Right thalamic hemorrhage with intraventricular extension. Most of the blood is located in the left lateral ventricle.   Mr Brain Wo Contrast 10/18/2018 IMPRESSION:   Severely motion degraded examination showing unchanged size of intraparenchymal hematoma centered in the right thalamus with intraventricular extension.   CT Angiogram Head  10/19/2018 IMPRESSION: 1. Size stable right thalamic and intraventricular clot with dilatation of the left temporal horn. 2. Tiny spot sign associated with the right thalamic hematoma, of questionable significance given 24 hour stability of intracranial hemorrhage. No underlying vascular malformation or aneurysm. 3. Small remote left thalamic and left cerebellar infarcts.  Ct Head Wo Contrast 10/20/2018 1. Unchanged size of intraparenchymal hematoma centered in the right thalamus. 2. Unchanged volume of intraventricular blood predominantly in the left lateral ventricle. 3. No new site of hemorrhage.   Ct Head Wo Contrast 10/22/2018 04:09  Unchanged right thalamic and intraventricular clot which dilates the temporal horn of the left lateral ventricle.   Ct Head Wo Contrast 10/22/2018 CLINICAL DATA:  Follow-up intracranial hemorrhage EXAM: CT HEAD WITHOUT CONTRAST TECHNIQUE: Contiguous axial images were obtained from the base of the skull through the vertex without intravenous contrast. COMPARISON:  Two days ago FINDINGS: Brain: Unchanged hematoma in the right thalamus  measuring up to 19 mm anterior-posterior. Intraventricular hemorrhage in the left more than right lateral ventricles with temporal horn dilatation and mild periventricular low-density on the left. Remote small vessel infarct in the left cerebellum and left thalamus. No evident infarct or shift. Vascular: No hyperdense vessel or unexpected calcification. Skull: No acute or aggressive finding Sinuses/Orbits: Left cataract resection and right ocular prosthesis. IMPRESSION: Unchanged right thalamic and intraventricular clot which dilates the temporal horn of the left lateral ventricle. Electronically Signed   By: Monte Fantasia M.D.   On: 10/22/2018 04:09     Transthoracic Echocardiogram  06/12/2018 Study Conclusions - Left ventricle: The cavity size was normal. Systolic function was   normal. The estimated ejection fraction was in the range of 60%   to 65%. Wall motion was normal; there were no regional wall   motion abnormalities. Abnormal relaxation with increased filling   pressures. - Aortic valve: There was no regurgitation. - Aortic root: The aortic root was normal in size. - Mitral valve: There was trivial regurgitation. - Right ventricle: Systolic function was normal. - Atrial septum: No defect or patent foramen ovale was identified. - Tricuspid valve: There was no significant regurgitation. - Inferior vena cava: The vessel was normal in size. The   respirophasic diameter changes were in the normal range (>= 50%),   consistent with normal central venous pressure. - Pericardium, extracardiac: A trivial pericardial effusion was   identified. Impressions: - Normal LV EF, no significant valvular abnormalities.  EKG - SR rate 92 BPM. (See cardiology reading for complete details)   PHYSICAL EXAM  General - obese, well developed, mildly lethargic.  Ophthalmologic - fundi not visualized due to noncooperation.  Cardiovascular - Regular rhythm, not in A. Fib, but tachycardia.  Neuro - Patient is mildly lethargic, more awake and alert than yesterday, sitting in bed, eyes open, but still agitated and combative with stimulation and treatment. She is able to follow simple commands, orientated to self, age and place, but not to time. Right ocular prosthesis, however, seems to have right hemianopia, no significant facial asymmetry, tongue midline in mouth.  Moving all extremities symmetrical now.  DTR not corporative, Sensation not cooperative, coordination no ataxia bilaterally by observation and gait not tested.   ASSESSMENT/PLAN Ms. Kelleigh Valenti-Cohen is a 53 y.o. female with history of type I diabetes, hypertension, paroxysmal A. fib  on Coumadin, Turner syndrome and legal blindness presenting with AMS, mildly hypertensive and combative. She did not receive IV t-PA due to hemorrhage.  Right thalamic hemorrhage with intraventricular extension, likely hypertensive in the setting of Coumadin coagulopathy s/p reversal  CT head - Right thalamic hemorrhage with left IVH.   MRI head - unchanged ICH centered in the right thalamus with intraventricular extension.   CTA Head - Size stable right thalamic and intraventricular clot with dilatation of the left temporal horn. Small remote left thalamic and left cerebellar infarcts.  No aneurysm, or AVM  Repeat CT 4/7 unchanged R thalamic and IVH w/ temporal horm dilation  2D Echo - 06/12/2018 - EF 60 - 65%.   LDL 65   HgbA1c - 6.6  UDS - positive for benzos   VTE prophylaxis - Heparin subcu  warfarin daily prior to admission, INR 2.6, reversed with vitamin K and Kcentra.  Now on No antithrombotic given hmg  NSG consulted, no intervention needed at this time  Therapy recommendations:  CIR, RW 5" wheels  Disposition:  Pending  DKA with type 1 diabetes  HgbA1c 5.7, at  goal < 7.0  Switch from insulin IV to Lantus  On lantus 15 bid, novolog 4 qAC as per DM coordinator  Hyperglycemia persist  SSI  CBG monitoring  Close monitoring  PAF  Currently in NSR  On Coumadin at home  INR 2.6 on admission, therapeutic -> reversed with Kcentra and vitamin K  INR now 1.1  Rate controlled  Hold off antithrombotic for now due to [place]  AKI with hypernatremia  AKI - creatinine -1.7->2.11->1.54->1.30  Na -154-155-154-159->147  D/c 1/2NS @ 75 - change to NS @ 75  Hypertension  Stable 120-150s  Off Cleviprex . SBP goal < 160 mmHg . Home meds:  cardizem 240, cozaar 50 bid . On home meds now and also continue metoprolol 25mg  bid . Long-term BP goal normotensive   Hyperlipidemia  Lipid lowering medication PTA: Crestor 20 mg daily  LDL 65  Current lipid  lowering medication: Crestor 20  Resume statin at discharge  OSA  As per husband, patient had OSA evaluation 1 year ago, no need CPAP at home  Intermittent desaturation during sleep on this admission  Not CPAP candidate due to agitation, combative, and restrain  CCM on board  Dysphagia  Secondary to stroke  On D3 nectar thick diet - concerning for silent aspiration  SLP following  Will do MBS today  Other Stroke Risk Factors  Former cigarette smoker - quit 30 years ago  ETOH use, advised to drink no more than 1 alcoholic beverage per day.  Obesity, Body mass index is 31.34 kg/m., recommend weight loss, diet and exercise as appropriate   Previous strokes by imaging - Small remote left thalamic and left cerebellar infarcts.  Other Active Problems  INR - 2.6 on admission - reversed -> 1.1  Anemia of chronic disease- 12.1->10.8  Leukocytosis - 21.7->20->10.2 (afebrile)->7.3  Agitation/encephalopathy, improving - off precedex - avoid benzos/opiates  Hospital day # 5  This patient is critically ill due to [place], IVH, DKA, PAF, dysphagia, OSA and at significant risk of neurological worsening, death form recurrent bleeding, hydrocephalus, heart failure, DKA, seizure. This patient's care requires constant monitoring of vital signs, hemodynamics, respiratory and cardiac monitoring, review of multiple databases, neurological assessment, discussion with family, other specialists and medical decision making of high complexity. I had long discussion with husband [name] over the phone, updated pt current condition, treatment plan and potential prognosis. He expressed understanding and appreciation. I spent 35 minutes of neurocritical care time in the care of this patient.  [name], MD PhD Stroke Neurology 10/23/2018 8:27 AM   To contact Stroke Continuity provider, please refer to [email or web address] After hours, contact General Neurology

## 2018-10-23 NOTE — Progress Notes (Signed)
Pt's husband has been updated and educated on today's events including walking with PT, oxygen status, and hypoglycemic event. Pt's husband verbalizes understanding.

## 2018-10-23 NOTE — Progress Notes (Signed)
Patients blood pressure is out of parameters. Current parameters less than 160, pressure is 172/80. MD contacted. PRN medications prescribed

## 2018-10-23 NOTE — Progress Notes (Signed)
Note concern for pt possibly aspirating and given her neuro diagnosis and left lobe pna- agree with concern.  Spoke to [employer] and MBS indicated to allow view of oropharyngeal structures.     Luanna Salk, [place] [facility] SLP Acute Rehab Services Pager [phone] Office [phone]

## 2018-10-23 NOTE — Progress Notes (Signed)
Called MD Windy Fast to inform of increasing desaturation episodes worsening. ABG and BiPAP ordered.

## 2018-10-23 NOTE — Evaluation (Deleted)
Physical Therapy Evaluation Patient Details Name: Tina [name] MRN: [id number] DOB: September 25, 1965 Today's Date: 10/23/2018   History of Present Illness  53 year old female admitted 10/18/2018 with AMS.  Head CT = right thalamic hemorrhage with intraventricular extension. Most of the blood is in the left lateral ventricle. PMHx: DM, HTN, Paroxysmal A Fib, Turner syndrome, legal blindness.  Clinical Impression  Pt progressing steadily towards her goals. Perseverating this session on drinking a "tall glass of water," and requiring frequent redirection to attend to task. Able to don socks with minimal assistance. Performing all functional mobility with two person moderate assistance. Ambulating 100 feet x 2 with both walker and 2 person handheld assistance. Tending to drift towards left during gait with no awareness, so handheld assistance provided easier guidance for navigation. Continue to recommend comprehensive inpatient rehab (CIR) for post-acute therapy needs.     Follow Up Recommendations CIR;Supervision/Assistance - 24 hour    Equipment Recommendations  Rolling walker with 5" wheels    Recommendations for Other Services       Precautions / Restrictions Precautions Precautions: Fall Restrictions Weight Bearing Restrictions: No      Mobility  Bed Mobility Overal bed mobility: Needs Assistance Bed Mobility: Supine to Sit;Sit to Supine Rolling: Mod assist;+2 for physical assistance   Supine to sit: Mod assist;+2 for physical assistance     General bed mobility comments: Pt requiring modA + 2 for supine <> sit, increased time and effort due to lack of focus rather than lack of ability  Transfers Overall transfer level: Needs assistance Equipment used: Rolling walker (2 wheeled);None Transfers: Sit to/from [facility] Sit to Stand: Mod assist Stand pivot transfers: Mod assist;+2 safety/equipment       General transfer comment: ModA for standing from edge  of bed and pivot transfer to [facility]. Assist for scooting due to short stature and leg length  Ambulation/Gait Ambulation/Gait assistance: Mod assist;+2 safety/equipment Gait Distance (Feet): 100 Feet(x2) Assistive device: Rolling walker (2 wheeled);2 person hand held assist Gait Pattern/deviations: Step-through pattern;Decreased stride length;Wide base of support Gait velocity: decreased Gait velocity interpretation: <1.31 ft/sec, indicative of household ambulator General Gait Details: Pt initially ambulating with RW and close chair follow, tending to drift towards left. On 2nd trial of ambulation, performed with 2 person HHA to improve manual guidance and safety. Cues for motor initiation/coordination.  Stairs            Wheelchair Mobility    Modified Rankin (Stroke Patients Only)       Balance Overall balance assessment: Needs assistance Sitting-balance support: Bilateral upper extremity supported Sitting balance-Leahy Scale: Fair     Standing balance support: No upper extremity supported;During functional activity Standing balance-Leahy Scale: Fair Standing balance comment: able to stand with no UE support with min guard assist                             Pertinent Vitals/Pain Pain Assessment: Faces Faces Pain Scale: Hurts little more Pain Location: bottom with performing peri care Pain Descriptors / Indicators: Grimacing Pain Intervention(s): Monitored during session;Repositioned    Home Living                        Prior Function                 Hand Dominance        Extremity/Trunk Assessment  Communication      Cognition Arousal/Alertness: Awake/alert Behavior During Therapy: Flat affect Overall Cognitive Status: Impaired/Different from baseline Area of Impairment: Following commands;Attention;Memory;Problem solving                   Current Attention Level: Focused Memory: Decreased  short-term memory Following Commands: Follows one step commands inconsistently;Follows one step commands with increased time     Problem Solving: Slow processing;Decreased initiation;Requires verbal cues;Requires tactile cues General Comments: Pt perseverating on getting something to drink (per RN, pt currently NPO). Does not respond to education. Needs frequent redirection to attend to task       General Comments      Exercises     Assessment/Plan    PT Assessment    PT Problem List         PT Treatment Interventions      PT Goals (Current goals can be found in the Care Plan section)  Acute Rehab PT Goals Patient Stated Goal: "drink water." Potential to Achieve Goals: Good    Frequency Min 3X/week   Barriers to discharge        Co-evaluation               AM-PAC PT "6 Clicks" Mobility  Outcome Measure Help needed turning from your back to your side while in a flat bed without using bedrails?: A Little Help needed moving from lying on your back to sitting on the side of a flat bed without using bedrails?: A Lot Help needed moving to and from a bed to a chair (including a wheelchair)?: A Lot Help needed standing up from a chair using your arms (e.g., wheelchair or bedside chair)?: A Lot Help needed to walk in hospital room?: A Lot Help needed climbing 3-5 steps with a railing? : Total 6 Click Score: 12    End of Session Equipment Utilized During Treatment: Gait belt;Oxygen Activity Tolerance: Patient tolerated treatment well Patient left: in bed;with call bell/phone within reach;with bed alarm set;with restraints reapplied Nurse Communication: Mobility status PT Visit Diagnosis: Other symptoms and signs involving the nervous system (R29.898);Other abnormalities of gait and mobility (R26.89)    Time: 2094-7096 PT Time Calculation (min) (ACUTE ONLY): 52 min   Charges:     PT Treatments $Gait Training: 8-22 mins $Therapeutic Activity: 23-37 mins        Ellamae Sia, [place], DPT Acute Rehabilitation Services Pager [phone] Office [phone]   Willy Eddy 10/23/2018, 3:23 PM

## 2018-10-23 NOTE — Progress Notes (Signed)
Hypoglycemic Event  CBG: 68   Treatment: D50 25 mL (12.5 gm)  Symptoms: None  Follow-up CBG: Time:1715 CBG Result:113  Possible Reasons for Event: Pt changed to NPO  Comments/MD notified:Called and informed MD [place]

## 2018-10-23 NOTE — Progress Notes (Signed)
Physical Therapy Treatment Patient Details Name: Tina [name] MRN: [id number] DOB: Feb 02, 1966 Today's Date: 10/23/2018    History of Present Illness 53 year old female admitted 10/18/2018 with AMS.  Head CT = right thalamic hemorrhage with intraventricular extension. Most of the blood is in the left lateral ventricle. PMHx: DM, HTN, Paroxysmal A Fib, Turner syndrome, legal blindness.    PT Comments     Pt progressing steadily towards her goals. Perseverating this session on drinking a "tall glass of water," and requiring frequent redirection to attend to task. Able to don socks with minimal assistance. Performing all functional mobility with two person moderate assistance. Ambulating 100 feet x 2 with both walker and 2 person handheld assistance. Tending to drift towards left during gait with no awareness, so handheld assistance provided easier guidance for navigation. Continue to recommend comprehensive inpatient rehab (CIR) for post-acute therapy needs.   Follow Up Recommendations  CIR;Supervision/Assistance - 24 hour     Equipment Recommendations  Rolling walker with 5" wheels    Recommendations for Other Services       Precautions / Restrictions Precautions Precautions: Fall Restrictions Weight Bearing Restrictions: No    Mobility  Bed Mobility Overal bed mobility: Needs Assistance Bed Mobility: Supine to Sit;Sit to Supine Rolling: Mod assist;+2 for physical assistance   Supine to sit: Mod assist;+2 for physical assistance     General bed mobility comments: Pt requiring modA + 2 for supine <> sit, increased time and effort due to lack of focus rather than lack of ability  Transfers Overall transfer level: Needs assistance Equipment used: Rolling walker (2 wheeled);None Transfers: Sit to/from [facility] Sit to Stand: Mod assist Stand pivot transfers: Mod assist;+2 safety/equipment       General transfer comment: ModA for standing from edge of  bed and pivot transfer to [facility]. Assist for scooting due to short stature and leg length  Ambulation/Gait Ambulation/Gait assistance: Mod assist;+2 safety/equipment Gait Distance (Feet): 100 Feet(x2) Assistive device: Rolling walker (2 wheeled);2 person hand held assist Gait Pattern/deviations: Step-through pattern;Decreased stride length;Wide base of support Gait velocity: decreased Gait velocity interpretation: <1.31 ft/sec, indicative of household ambulator General Gait Details: Pt initially ambulating with RW and close chair follow, tending to drift towards left. On 2nd trial of ambulation, performed with 2 person HHA to improve manual guidance and safety. Cues for motor initiation/coordination.   Stairs             Wheelchair Mobility    Modified Rankin (Stroke Patients Only)       Balance Overall balance assessment: Needs assistance Sitting-balance support: Bilateral upper extremity supported Sitting balance-Leahy Scale: Fair     Standing balance support: No upper extremity supported;During functional activity Standing balance-Leahy Scale: Fair Standing balance comment: able to stand with no UE support with min guard assist                            Cognition Arousal/Alertness: Awake/alert Behavior During Therapy: Flat affect Overall Cognitive Status: Impaired/Different from baseline Area of Impairment: Following commands;Attention;Memory;Problem solving                   Current Attention Level: Focused Memory: Decreased short-term memory Following Commands: Follows one step commands inconsistently;Follows one step commands with increased time     Problem Solving: Slow processing;Decreased initiation;Requires verbal cues;Requires tactile cues General Comments: Pt perseverating on getting something to drink (per RN, pt currently NPO). Does not respond to  education. Needs frequent redirection to attend to task       Exercises       General Comments        Pertinent Vitals/Pain Pain Assessment: Faces Faces Pain Scale: Hurts little more Pain Location: bottom with performing peri care Pain Descriptors / Indicators: Grimacing Pain Intervention(s): Monitored during session;Repositioned    Home Living                      Prior Function            PT Goals (current goals can now be found in the care plan section) Acute Rehab PT Goals Patient Stated Goal: "drink water." Potential to Achieve Goals: Good Progress towards PT goals: Progressing toward goals    Frequency    Min 3X/week      PT Plan Current plan remains appropriate    Co-evaluation              AM-PAC PT "6 Clicks" Mobility   Outcome Measure  Help needed turning from your back to your side while in a flat bed without using bedrails?: A Little Help needed moving from lying on your back to sitting on the side of a flat bed without using bedrails?: A Lot Help needed moving to and from a bed to a chair (including a wheelchair)?: A Lot Help needed standing up from a chair using your arms (e.g., wheelchair or bedside chair)?: A Lot Help needed to walk in hospital room?: A Lot Help needed climbing 3-5 steps with a railing? : Total 6 Click Score: 12    End of Session Equipment Utilized During Treatment: Gait belt;Oxygen Activity Tolerance: Patient tolerated treatment well Patient left: in bed;with call bell/phone within reach;with bed alarm set;with restraints reapplied Nurse Communication: Mobility status PT Visit Diagnosis: Other symptoms and signs involving the nervous system (R29.898);Other abnormalities of gait and mobility (R26.89)     Time: 1245-8099 PT Time Calculation (min) (ACUTE ONLY): 52 min  Charges:  $Gait Training: 8-22 mins $Therapeutic Activity: 23-37 mins                    Ellamae Sia, [place], DPT Acute Rehabilitation Services Pager [phone] Office [phone]   Willy Eddy 10/23/2018,  3:27 PM

## 2018-10-23 NOTE — Progress Notes (Signed)
Inpatient Diabetes Program Recommendations  AACE/ADA: New Consensus Statement on Inpatient Glycemic Control (2015)  Target Ranges:  Prepandial:   less than 140 mg/dL      Peak postprandial:   less than 180 mg/dL (1-2 hours)      Critically ill patients:  140 - 180 mg/dL   Lab Results  Component Value Date   GLUCAP 237 (H) 10/23/2018   HGBA1C 6.6 (H) 10/22/2018    Review of Glycemic Control Results for REMONA, BOOM (MRN [id number]) as of 10/23/2018 10:11  Ref. Range 10/22/2018 12:14 10/22/2018 16:00 10/22/2018 20:12 10/23/2018 08:04  Glucose-Capillary Latest Ref Range: 70 - 99 mg/dL 345 (H) 175 (H) 74 237 (H)   Diabetes history: Type 1 DM Outpatient Diabetes medications: insulin pump Current orders for Inpatient glycemic control: Novolog 0-15 units TID, Novolog 0-5 QHS, Lantus 12 units BID, novolog 4 units TID  Inpatient Diabetes Program Recommendations:    Consider increasing Lantus 14 units BID.  Noted concern for aspiration. In the event diet changes, consider holding meal coverage, as patient could be at risk for hypoglycemia.   Thanks, Bronson Curb, MSN, RNC-OB Diabetes Coordinator [phone] (8a-5p)

## 2018-10-23 NOTE — Patient Outreach (Signed)
  [facility]) Care Management Chronic Special Needs Program    10/23/2018  Name: Tina [name], DOB: Dec 07, 1965  MRN: [id number]   [name] is enrolled in a chronic special needs plan for Diabetes  Client admitted to [facility] hospital on 10/18/18 with altered mental status and intracranial bleed. No health risk assessment and unable to complete care plan has been completed as effective date of plan 10/16/18. Plan to follow up with [place] hospital liaison.  [name] RN, [name], CDE Chronic Care Management Coordinator [facility] Care Management [phone]

## 2018-10-23 NOTE — Progress Notes (Addendum)
NAME:  Tina [name], MRN:  [id number], DOB:  [birth year], LOS: 5 ADMISSION DATE:  10/18/2018, CONSULTATION DATE:  10/20/2018 REFERRING MD:  Dr Erlinda Hong, CHIEF COMPLAINT:  DKA   Brief History   53 year old obese female with hx of OSA on CPAP at hoe and typle 1 DM. Admitted 10/18/2018 for Intracranial bleed . Course complicated by agitation and compounded by nPO status, develolping DKA and inability  to do biPap / cpap and also gettting benzo.opioid for procedures such as imaging brain. PCCM called as she is on 6L [place] - 98% (baseline -  ? On o2) And also DKA  Past Medical History   has a past medical history of Arthritis, Cataract, Diabetic retinopathy ([place]), Diverticulosis, DM (diabetes mellitus), type 1 ([place]), Dyspnea, Elevated LFTs, Hyperlipidemia, Hypertension, Internal hemorrhoids, Legally blind in right eye, as defined in [place], Osteoporosis (11/2017), Retinal detachment, Tachycardia, Tubular adenoma of colon, Turner's syndrome, and Visual loss, bilateral.  Significant Hospital Events   10/18/2018= admit 4/5 - DKA ccm consult  Consults:  4/5 - ccm consult  Procedures:    Significant Diagnostic Tests:  Chest x-ray on 10/20/2018-basal atelectasis  Antimicrobials:    Interim history/subjective:  No overnight events  Objective   Blood pressure (!) 147/71, pulse 79, temperature 99 F (37.2 C), temperature source Axillary, resp. rate 18, height 4\' 9"  (1.448 m), weight 65.7 kg, SpO2 100 %.        Intake/Output Summary (Last 24 hours) at 10/23/2018 0817 Last data filed at 10/23/2018 0600 Gross per 24 hour  Intake 1547.18 ml  Output 300 ml  Net 1247.18 ml   Filed Weights   10/18/18 1104 10/18/18 1700  Weight: 68.5 kg 65.7 kg   Examination:  General:  Middle aged female  Neuro:  Oriented x 3, but significant agitated delirium.  HEENT:  [place]/AT, No JVD noted, PERRL Cardiovascular:  RRR, no MRG Lungs:  Clear bilateral breath sounds Abdomen:  Soft, non-distended, non-tender.   Musculoskeletal:  No acute deformity Skin:  Intact, MMM   Resolved Hospital Problem list     Assessment & Plan:   DKA: Now has diet. Was hypoglycemic overnight, but otherwise remains hyperglycemic.  - Will await diabetes coordinator recommendations prior to making changes.   Encephalopathy, multifactorial: Agitated delirium - May benefit from low dose depakote or seroquel. Will defer to stroke as to not diminish neuro exam.  - Avoid benzodiazepines/opiates  She does carry a history of obstructive sleep apnea: Last time she saw Dr. Elsworth Soho in the office in 2015-study was negative for significant sleep disordered breathing.She is noted to be having apneic episodes which may be occurring because of the recent CVA - CPAP 10cmH20 QHS, however, has been unable to tolerate due to encephalopathy and wrist restraints.   Dysphagia: - Some adventitious breath sounds on exam per RN. Aspiration concern. Will assess CXR.  - Plan per SLP.   Hypertension HLD - continue diltiazem, losartan, metoprolol, rosuvastatin.   Intracerebral hemorrhage - Stable on CT - Stroke service following.  Leukocytosis - Continue to trend CBC  Acute kidney injury Hypernatremia - repeat CMP this morning - Continue half NS    Best practice:  Diet: dysphagia 3 Pain/Anxiety/Delirium protocol (if indicated): Precedex off VAP protocol (if indicated): n/a DVT prophylaxis: SCD GI prophylaxis: pepcid Glucose control: DM coordinator following. Mobility: Bedrest Code Status: Full code Family Communication: No family at bedside Disposition: ICU  Labs   CBC: Recent Labs  Lab 10/18/18 1112 10/20/18 0356 10/20/18 1154 10/21/18 0528 10/22/18  0550  WBC  --  21.7*  --  20.0* 10.2  HGB 12.9 12.8 10.9* 11.9* 12.1  HCT 38.0 42.9 32.0* 37.1 38.4  MCV  --  101.7*  --  95.4 95.8  PLT  --  247  --  249 109    Basic Metabolic Panel: Recent Labs  Lab 10/18/18 1050  10/20/18 1447 10/20/18 1908 10/20/18 2235  10/21/18 0528 10/22/18 0550 10/23/18 0303  NA 143   < > 154* 155* 150* 154* 159*  --   K 4.3   < > 4.4 3.9 4.1 4.2 4.3  --   CL 109   < > 124* 124* 119* 123* 128*  --   CO2 23   < > 18* 20* 17* 16* 27  --   GLUCOSE 180*   < > 169* 201* 232* 266* 335*  --   BUN 24*   < > 45* 48* 47* 48* 35*  --   CREATININE 1.36*   < > 1.87* 2.08* 2.10* 2.11* 1.54*  --   CALCIUM 9.2   < > 9.3 9.3 9.1 9.3 9.2  --   MG 2.3  --   --   --   --  2.4 2.4 2.3  PHOS 4.4  --   --   --   --  3.9 3.4 2.8   < > = values in this interval not displayed.   GFR: Estimated Creatinine Clearance: 33.3 mL/min (A) (by C-G formula based on SCr of 1.54 mg/dL (H)). Recent Labs  Lab 10/18/18 1050 10/20/18 0356 10/20/18 2235 10/21/18 0528 10/22/18 0550  WBC  --  21.7*  --  20.0* 10.2  LATICACIDVEN 1.4  --  1.4  --   --     Liver Function Tests: Recent Labs  Lab 10/18/18 1050 10/21/18 0528  AST 42* 61*  ALT 69* 70*  ALKPHOS 126 99  BILITOT 1.1 1.5*  PROT 6.4* 6.1*  ALBUMIN 3.2* 3.1*   Recent Labs  Lab 10/18/18 1050  LIPASE 20   Recent Labs  Lab 10/18/18 1050  AMMONIA 9    ABG    Component Value Date/Time   PHART 7.415 10/20/2018 1154   PCO2ART 33.5 10/20/2018 1154   PO2ART 64.0 (L) 10/20/2018 1154   HCO3 21.5 10/20/2018 1154   TCO2 22 10/20/2018 1154   ACIDBASEDEF 2.0 10/20/2018 1154   O2SAT 92.0 10/20/2018 1154     Coagulation Profile: Recent Labs  Lab 10/18/18 1413 10/18/18 2213 10/19/18 0246 10/20/18 0356 10/21/18 0528  INR 1.0 1.2 1.1 1.1 1.1    Cardiac Enzymes: Recent Labs  Lab 10/18/18 1050  TROPONINI 0.03*    HbA1C: Hgb A1c MFr Bld  Date/Time Value Ref Range Status  10/22/2018 05:50 AM 6.6 (H) 4.8 - 5.6 % Final    Comment:    (NOTE) Pre diabetes:          5.7%-6.4% Diabetes:              >6.4% Glycemic control for   <7.0% adults with diabetes   06/13/2018 05:59 AM 5.7 (H) 4.8 - 5.6 % Final    Comment:    (NOTE) Pre diabetes:          5.7%-6.4% Diabetes:               >6.4% Glycemic control for   <7.0% adults with diabetes     CBG: Recent Labs  Lab 10/22/18 0750 10/22/18 1214 10/22/18 1600 10/22/18 2012 10/23/18 0804  GLUCAP 304*  345* 175* 12 237*    Review of Systems:   Unobtainable secondary to mental status  Past Medical History  She,  has a past medical history of Arthritis, Cataract, Diabetic retinopathy ([place]), Diverticulosis, DM (diabetes mellitus), type 1 ([place]), Dyspnea, Elevated LFTs, Hyperlipidemia, Hypertension, Internal hemorrhoids, Legally blind in right eye, as defined in [place], Osteoporosis (11/2017), Retinal detachment, Tachycardia, Tubular adenoma of colon, Turner's syndrome, and Visual loss, bilateral.   Surgical History    Past Surgical History:  Procedure Laterality Date  . CATARACT EXTRACTION    . CHOLECYSTECTOMY    . Hepatic adenoma removed    . RETINAL DETACHMENT SURGERY    . right eye surgery      3 years ago  . TONSILLECTOMY       Social History   reports that she quit smoking about 30 years ago. Her smoking use included cigarettes. She has a 0.10 pack-year smoking history. She has never used smokeless tobacco. She reports current alcohol use. She reports that she does not use drugs.   Family History   Her family history includes Cancer in her father; Diabetes in her maternal grandmother; Emphysema in her paternal grandfather; Esophageal cancer in her paternal aunt. There is no history of Colon cancer, Colon polyps, Rectal cancer, or Stomach cancer.   Allergies Allergies  Allergen Reactions  . Acetazolamide Anaphylaxis and Other (See Comments)     Reaction, drop in Blood pressure that caused patient to stay in bed for 5 days, inconherient  Reaction, drop in Blood pressure that caused patient to stay in bed for 5 days, inconherient  . Ace Inhibitors Cough    Critical care time:   Georgann Housekeeper, [facility] Pager [phone] or [phone]  10/23/2018 8:33 AM

## 2018-10-23 NOTE — Consult Note (Signed)
   [facility] CM Inpatient Consult   10/23/2018  Journiee Valenti-Cohen [birth year] [phone]   Patient is currently active with [facility] for chronic care management services.  Patient has been engaged in the [place] program.  Patient will by followed in this plan by a [facility] Chronic Care Manager.   Of note, [facility] Care Management services does not replace or interfere with any services that are needed or arranged by inpatient case management or social work.  For additional questions or referrals please contact:   Natividad Brood, RN BSN [place] [facility] Liaison  [phone] business mobile phone Toll free office [phone]

## 2018-10-23 NOTE — Progress Notes (Signed)
SLP Cancellation Note  Patient Details Name: Tina [name] MRN: [id number] DOB: 02-24-1966   Cancelled treatment:       Reason Eval/Treat Not Completed: Other (comment). Pt was too combative to attempt MBS today. Pt to remain NPO and will f/u for MBS readiness tomorrow.    Jasime Westergren, Katherene Ponto 10/23/2018, 2:00 PM

## 2018-10-24 ENCOUNTER — Inpatient Hospital Stay (HOSPITAL_COMMUNITY): Payer: HMO

## 2018-10-24 LAB — CBC
HCT: 33.7 % — ABNORMAL LOW (ref 36.0–46.0)
Hemoglobin: 11.1 g/dL — ABNORMAL LOW (ref 12.0–15.0)
MCH: 30.7 pg (ref 26.0–34.0)
MCHC: 32.9 g/dL (ref 30.0–36.0)
MCV: 93.4 fL (ref 80.0–100.0)
Platelets: 131 10*3/uL — ABNORMAL LOW (ref 150–400)
RBC: 3.61 MIL/uL — ABNORMAL LOW (ref 3.87–5.11)
RDW: 13.7 % (ref 11.5–15.5)
WBC: 7.3 10*3/uL (ref 4.0–10.5)
nRBC: 0 % (ref 0.0–0.2)

## 2018-10-24 LAB — PHOSPHORUS: Phosphorus: 3 mg/dL (ref 2.5–4.6)

## 2018-10-24 LAB — GLUCOSE, CAPILLARY
Glucose-Capillary: 128 mg/dL — ABNORMAL HIGH (ref 70–99)
Glucose-Capillary: 129 mg/dL — ABNORMAL HIGH (ref 70–99)
Glucose-Capillary: 152 mg/dL — ABNORMAL HIGH (ref 70–99)
Glucose-Capillary: 160 mg/dL — ABNORMAL HIGH (ref 70–99)
Glucose-Capillary: 202 mg/dL — ABNORMAL HIGH (ref 70–99)
Glucose-Capillary: 208 mg/dL — ABNORMAL HIGH (ref 70–99)
Glucose-Capillary: 362 mg/dL — ABNORMAL HIGH (ref 70–99)

## 2018-10-24 LAB — BASIC METABOLIC PANEL
Anion gap: 8 (ref 5–15)
BUN: 10 mg/dL (ref 6–20)
CO2: 27 mmol/L (ref 22–32)
Calcium: 8.6 mg/dL — ABNORMAL LOW (ref 8.9–10.3)
Chloride: 116 mmol/L — ABNORMAL HIGH (ref 98–111)
Creatinine, Ser: 1.11 mg/dL — ABNORMAL HIGH (ref 0.44–1.00)
GFR calc Af Amer: >60 mL/min (ref >60)
GFR calc non Af Amer: 57 mL/min — ABNORMAL LOW (ref >60)
Glucose, Bld: 174 mg/dL — ABNORMAL HIGH (ref 70–99)
Potassium: 3.4 mmol/L — ABNORMAL LOW (ref 3.5–5.1)
Sodium: 151 mmol/L — ABNORMAL HIGH (ref 135–145)

## 2018-10-24 LAB — MAGNESIUM: Magnesium: 1.8 mg/dL (ref 1.7–2.4)

## 2018-10-24 MED ORDER — METOPROLOL TARTRATE 50 MG PO TABS
50.0000 mg | ORAL_TABLET | Freq: Two times a day (BID) | ORAL | Status: DC
  Administered 2018-10-24 – 2018-11-06 (×15): 50 mg via ORAL
  Filled 2018-10-24 (×21): qty 1

## 2018-10-24 MED ORDER — SODIUM CHLORIDE 0.45 % IV SOLN
INTRAVENOUS | Status: DC
  Administered 2018-10-24 – 2018-10-25 (×3): via INTRAVENOUS

## 2018-10-24 MED ORDER — INSULIN GLARGINE 100 UNIT/ML ~~LOC~~ SOLN
12.0000 [IU] | Freq: Two times a day (BID) | SUBCUTANEOUS | Status: DC
  Administered 2018-10-24 – 2018-10-25 (×3): 12 [IU] via SUBCUTANEOUS
  Filled 2018-10-24 (×6): qty 0.12

## 2018-10-24 MED ORDER — HALOPERIDOL LACTATE 5 MG/ML IJ SOLN
2.0000 mg | Freq: Three times a day (TID) | INTRAMUSCULAR | Status: DC | PRN
  Administered 2018-10-24 – 2018-10-25 (×2): 2 mg via INTRAVENOUS
  Filled 2018-10-24 (×4): qty 1

## 2018-10-24 NOTE — Progress Notes (Signed)
Physical Therapy Treatment Patient Details Name: Tina [name] MRN: [id number] DOB: 10/03/1965 Today's Date: 10/24/2018    History of Present Illness 53 year old female admitted 10/18/2018 with AMS.  Head CT = right thalamic hemorrhage with intraventricular extension. Most of the blood is in the left lateral ventricle. PMHx: DM, HTN, Paroxysmal A Fib, Turner syndrome, legal blindness.    PT Comments    Pt slightly more calm this session, still perseverating on drinking water but able to be more easily redirected. Progressing well towards her physical therapy goals, with increased ambulation distance this session. Still requiring two person moderate assistance for balance/guidance. Displays posterior/left lateral lean. Continues with decreased cognition including decreased awareness/attention, poor balance, coordination, and apraxia. Continue to recommend comprehensive inpatient rehab (CIR) for post-acute therapy needs.     Follow Up Recommendations  CIR;Supervision/Assistance - 24 hour     Equipment Recommendations  Rolling walker with 5" wheels    Recommendations for Other Services       Precautions / Restrictions Precautions Precautions: Fall Restrictions Weight Bearing Restrictions: No    Mobility  Bed Mobility Overal bed mobility: Needs Assistance Bed Mobility: Supine to Sit;Sit to Supine     Supine to sit: Mod assist Sit to supine: Min guard   General bed mobility comments: ModA to initiate supine > sit. No physical assistance required for return to bed  Transfers Overall transfer level: Needs assistance Equipment used: Rolling walker (2 wheeled);None Transfers: Sit to/from [facility] Sit to Stand: Mod assist Stand pivot transfers: Mod assist       General transfer comment: ModA for standing from edge of bed and pivot transfer to [facility]. Assist for scooting due to short stature and leg length  Ambulation/Gait Ambulation/Gait assistance: Mod  assist;+2 safety/equipment Gait Distance (Feet): 180 Feet Assistive device: 2 person hand held assist Gait Pattern/deviations: Step-through pattern;Decreased stride length;Wide base of support Gait velocity: decreased   General Gait Details: Initial heavy posterior lean but able to correct with anterior momentum. 2 person HHA for improved guidance/navigation. Pt unable to wayfind back to her room   Stairs             Wheelchair Mobility    Modified Rankin (Stroke Patients Only)       Balance Overall balance assessment: Needs assistance Sitting-balance support: Bilateral upper extremity supported Sitting balance-Leahy Scale: Fair     Standing balance support: No upper extremity supported;During functional activity Standing balance-Leahy Scale: Fair Standing balance comment: able to stand with no UE support with min guard assist                            Cognition Arousal/Alertness: Awake/alert Behavior During Therapy: Flat affect Overall Cognitive Status: Impaired/Different from baseline Area of Impairment: Following commands;Attention;Memory;Problem solving                   Current Attention Level: Focused Memory: Decreased short-term memory Following Commands: Follows one step commands inconsistently;Follows one step commands with increased time     Problem Solving: Slow processing;Decreased initiation;Requires verbal cues;Requires tactile cues General Comments: Pt perseverating on getting something to drink (per RN, pt currently NPO). Does not respond to education. Needs frequent redirection to attend to task       Exercises      General Comments        Pertinent Vitals/Pain Pain Assessment: Faces Pain Score: 0-No pain Faces Pain Scale: No hurt    Home Living  Prior Function            PT Goals (current goals can now be found in the care plan section) Acute Rehab PT Goals Patient Stated Goal:  "drink water." Potential to Achieve Goals: Good Progress towards PT goals: Progressing toward goals    Frequency    Min 3X/week      PT Plan Current plan remains appropriate    Co-evaluation              AM-PAC PT "6 Clicks" Mobility   Outcome Measure  Help needed turning from your back to your side while in a flat bed without using bedrails?: A Little Help needed moving from lying on your back to sitting on the side of a flat bed without using bedrails?: A Lot Help needed moving to and from a bed to a chair (including a wheelchair)?: A Lot Help needed standing up from a chair using your arms (e.g., wheelchair or bedside chair)?: A Lot Help needed to walk in hospital room?: A Lot Help needed climbing 3-5 steps with a railing? : Total 6 Click Score: 12    End of Session Equipment Utilized During Treatment: Gait belt;Oxygen Activity Tolerance: Patient tolerated treatment well Patient left: in bed;with call bell/phone within reach;with bed alarm set;with restraints reapplied Nurse Communication: Mobility status PT Visit Diagnosis: Other symptoms and signs involving the nervous system (R29.898);Other abnormalities of gait and mobility (R26.89)     Time: 3716-9678 PT Time Calculation (min) (ACUTE ONLY): 26 min  Charges:  $Therapeutic Activity: 23-37 mins                     Ellamae Sia, [place], DPT Acute Rehabilitation Services Pager [phone] Office [phone]    Willy Eddy 10/24/2018, 1:23 PM

## 2018-10-24 NOTE — Progress Notes (Signed)
Attemped EEG  Patient very confused, uncooperative to testing right now.   Will attempt again when pt is more relaxed . Dr. Erlinda Hong notified LG

## 2018-10-24 NOTE — Progress Notes (Signed)
Inpatient Rehabilitation-Admissions Coordinator    Met with patient at the bedside to discuss team's recommendation for inpatient rehabilitation.  Due to pt lethargy and current condition, AC spoke with pt's husband to discuss program details and gather information about caregiver support. It appears pt would have recommended supervision from her husband and he is interested in [place] program, if she can qualify. AC will follow along for tolerance and medical readiness.   Please call if questions.   Jhonnie Garner, OTR/L  Rehab Admissions Coordinator  [phone] 10/24/2018 10:13 AM

## 2018-10-24 NOTE — Progress Notes (Signed)
  Speech Language Pathology Treatment: Dysphagia  Patient Details Name: Tina [name] MRN: [id number] DOB: Jan 14, 1966 Today's Date: 10/24/2018 Time: 8099-8338 SLP Time Calculation (min) (ACUTE ONLY): 25 min  Assessment / Plan / Recommendation Clinical Impression  Pt seen at beside to assess diet tolerance. Pt unable to participate in MBS this AM due to extreme lethargy, but during this session she was alert and cooperative. Demonstrated intermittent coughing on thin liquids and puree. Pt continues to gulp liquids and eat large bites of puree quickly. No coughing observed when taking smaller sips of nectar thick liquids from a cup. Concern for aspiration due to Pt's neuro diagnosis and left lobe pneumonia. Recommend dys 2 diet with nectar thick liquids. Plan for MBS tomorrow. Will follow up and update goals pending results of MBS.    HPI HPI: 53 year old female admitted 10/18/2018 with AMS. PMH: DM, HTN, Paroxysmal A Fib, Turner syndrome, legal blindness. Head CT = right thalamic hemorrhage with intraventricular extension. Most of the blood is in the left lateral ventricle.      SLP Plan  Continue with current plan of care;MBS       Recommendations  Diet recommendations: Dysphagia 2 (fine chop);Nectar-thick liquid Liquids provided via: Cup;Straw Medication Administration: Whole meds with liquid Supervision: Full supervision/cueing for compensatory strategies Compensations: Minimize environmental distractions;Slow rate;Small sips/bites Postural Changes and/or Swallow Maneuvers: Seated upright 90 degrees                Oral Care Recommendations: Oral care BID Follow up Recommendations: Inpatient Rehab SLP Visit Diagnosis: Dysphagia, unspecified (R13.10) Plan: Continue with current plan of care;MBS       GO                Laurey Arrow 10/24/2018, 3:26 PM  Melody Haver, M.Ed., [facility] [phone]

## 2018-10-24 NOTE — Progress Notes (Addendum)
NAME:  Tina [name], MRN:  [id number], DOB:  [birth year], LOS: 6 ADMISSION DATE:  10/18/2018, CONSULTATION DATE:  10/20/2018 REFERRING MD:  Dr Erlinda Hong, CHIEF COMPLAINT:  DKA   Brief History   53 year old obese female with hx of OSA on CPAP at hoe and typle 1 DM. Admitted 10/18/2018 for Intracranial bleed . Course complicated by agitation and compounded by nPO status, develolping DKA and inability  to do biPap / cpap and also gettting benzo.opioid for procedures such as imaging brain. PCCM called as she is on 6L [place] - 98% (baseline -  ? On o2) And also DKA  Past Medical History   has a past medical history of Arthritis, Cataract, Diabetic retinopathy ([place]), Diverticulosis, DM (diabetes mellitus), type 1 ([place]), Dyspnea, Elevated LFTs, Hyperlipidemia, Hypertension, Internal hemorrhoids, Legally blind in right eye, as defined in [place], Osteoporosis (11/2017), Retinal detachment, Tachycardia, Tubular adenoma of colon, Turner's syndrome, and Visual loss, bilateral.  Significant Hospital Events   10/18/2018= admit 4/5 - DKA ccm consult  Consults:  4/5 - ccm consult  Procedures:    Significant Diagnostic Tests:  Chest x-ray on 10/20/2018-basal atelectasis  Antimicrobials:    Interim history/subjective:  Agitation, confusion No other overnight events  Objective   Blood pressure 134/63, pulse 83, temperature 100.1 F (37.8 C), temperature source Axillary, resp. rate (!) 22, height 4\' 9"  (1.448 m), weight 65.7 kg, SpO2 100 %.        Intake/Output Summary (Last 24 hours) at 10/24/2018 1004 Last data filed at 10/24/2018 6629 Gross per 24 hour  Intake 950.91 ml  Output 650 ml  Net 300.91 ml   Filed Weights   10/18/18 1104 10/18/18 1700  Weight: 68.5 kg 65.7 kg   Examination:  Middle-aged lady Agitated Pupils equal reacting to light and accommodation S1-S2 appreciated Clear breath sounds bilaterally Abdomen is soft bowel sounds appreciated Extremities shows no clubbing no edema  Resolved  Hospital Problem list     Assessment & Plan:   DKA -Was started on a diet but concerns for aspirations-held Continue to monitor sugars Range 148-208  Encephalopathy, multifactorial Agitated delirium Avoid benzodiazepines/opiates ABG was within normal limits  History of obstructive sleep apnea -No study on record showing obstructive sleep apnea -BiPAP was put in place yesterday  Dysphagia: - Some adventitious breath sounds on exam per RN. Aspiration concern. - Plan per SLP.  -Still n.p.o. -Speech to continue to follow  Hypertension HLD - continue diltiazem, losartan, metoprolol, rosuvastatin.   Intracerebral hemorrhage - Stable on CT - Stroke service following.  Leukocytosis - Continue to trend CBC  Acute kidney injury Hypernatremia - Continue half NS    Best practice:  Diet: dysphagia 3 Pain/Anxiety/Delirium protocol (if indicated): Precedex off VAP protocol (if indicated): n/a DVT prophylaxis: SCD GI prophylaxis: pepcid Glucose control: DM coordinator following. Mobility: Bedrest Code Status: Full code Family Communication: No family at bedside Disposition: ICU  Labs   CBC: Recent Labs  Lab 10/20/18 0356  10/21/18 0528 10/22/18 0550 10/23/18 0911 10/23/18 1823 10/24/18 0603  WBC 21.7*  --  20.0* 10.2 7.3  --  7.3  HGB 12.8   < > 11.9* 12.1 10.8* 10.5* 11.1*  HCT 42.9   < > 37.1 38.4 35.3* 31.0* 33.7*  MCV 101.7*  --  95.4 95.8 95.1  --  93.4  PLT 247  --  249 195 142*  --  131*   < > = values in this interval not displayed.    Basic Metabolic Panel: Recent  Labs  Lab 10/18/18 1050  10/20/18 2235 10/21/18 0528 10/22/18 0550 10/23/18 0303 10/23/18 0911 10/23/18 1823 10/24/18 0603  NA 143   < > 150* 154* 159*  --  147* 152* 151*  K 4.3   < > 4.1 4.2 4.3  --  3.5 3.3* 3.4*  CL 109   < > 119* 123* 128*  --  116*  --  116*  CO2 23   < > 17* 16* 27  --  23  --  27  GLUCOSE 180*   < > 232* 266* 335*  --  356*  --  174*  BUN 24*   < > 47*  48* 35*  --  20  --  10  CREATININE 1.36*   < > 2.10* 2.11* 1.54*  --  1.30*  --  1.11*  CALCIUM 9.2   < > 9.1 9.3 9.2  --  8.6*  --  8.6*  MG 2.3  --   --  2.4 2.4 2.3  --   --  1.8  PHOS 4.4  --   --  3.9 3.4 2.8  --   --  3.0   < > = values in this interval not displayed.   GFR: Estimated Creatinine Clearance: 46.2 mL/min (A) (by C-G formula based on SCr of 1.11 mg/dL (H)). Recent Labs  Lab 10/18/18 1050  10/20/18 2235 10/21/18 0528 10/22/18 0550 10/23/18 0911 10/24/18 0603  WBC  --    < >  --  20.0* 10.2 7.3 7.3  LATICACIDVEN 1.4  --  1.4  --   --   --   --    < > = values in this interval not displayed.    Liver Function Tests: Recent Labs  Lab 10/18/18 1050 10/21/18 0528  AST 42* 61*  ALT 69* 70*  ALKPHOS 126 99  BILITOT 1.1 1.5*  PROT 6.4* 6.1*  ALBUMIN 3.2* 3.1*   Recent Labs  Lab 10/18/18 1050  LIPASE 20   Recent Labs  Lab 10/18/18 1050  AMMONIA 9    ABG    Component Value Date/Time   PHART 7.428 10/23/2018 1823   PCO2ART 38.5 10/23/2018 1823   PO2ART 78.0 (L) 10/23/2018 1823   HCO3 25.4 10/23/2018 1823   TCO2 27 10/23/2018 1823   ACIDBASEDEF 2.0 10/20/2018 1154   O2SAT 96.0 10/23/2018 1823     Coagulation Profile: Recent Labs  Lab 10/18/18 1413 10/18/18 2213 10/19/18 0246 10/20/18 0356 10/21/18 0528  INR 1.0 1.2 1.1 1.1 1.1    Cardiac Enzymes: Recent Labs  Lab 10/18/18 1050  TROPONINI 0.03*    HbA1C: Hgb A1c MFr Bld  Date/Time Value Ref Range Status  10/22/2018 05:50 AM 6.6 (H) 4.8 - 5.6 % Final    Comment:    (NOTE) Pre diabetes:          5.7%-6.4% Diabetes:              >6.4% Glycemic control for   <7.0% adults with diabetes   06/13/2018 05:59 AM 5.7 (H) 4.8 - 5.6 % Final    Comment:    (NOTE) Pre diabetes:          5.7%-6.4% Diabetes:              >6.4% Glycemic control for   <7.0% adults with diabetes     CBG: Recent Labs  Lab 10/23/18 1717 10/23/18 2013 10/24/18 0000 10/24/18 0356 10/24/18 0743   GLUCAP 113* 148* 208* 202* 152*  Review of Systems:   Unobtainable secondary to mental status  Past Medical History  She,  has a past medical history of Arthritis, Cataract, Diabetic retinopathy ([place]), Diverticulosis, DM (diabetes mellitus), type 1 ([place]), Dyspnea, Elevated LFTs, Hyperlipidemia, Hypertension, Internal hemorrhoids, Legally blind in right eye, as defined in [place], Osteoporosis (11/2017), Retinal detachment, Tachycardia, Tubular adenoma of colon, Turner's syndrome, and Visual loss, bilateral.   Surgical History    Past Surgical History:  Procedure Laterality Date  . CATARACT EXTRACTION    . CHOLECYSTECTOMY    . Hepatic adenoma removed    . RETINAL DETACHMENT SURGERY    . right eye surgery      3 years ago  . TONSILLECTOMY       Social History   reports that she quit smoking about 30 years ago. Her smoking use included cigarettes. She has a 0.10 pack-year smoking history. She has never used smokeless tobacco. She reports current alcohol use. She reports that she does not use drugs.   Family History   Her family history includes Cancer in her father; Diabetes in her maternal grandmother; Emphysema in her paternal grandfather; Esophageal cancer in her paternal aunt. There is no history of Colon cancer, Colon polyps, Rectal cancer, or Stomach cancer.   Allergies Allergies  Allergen Reactions  . Acetazolamide Anaphylaxis and Other (See Comments)     Reaction, drop in Blood pressure that caused patient to stay in bed for 5 days, inconherient  Reaction, drop in Blood pressure that caused patient to stay in bed for 5 days, inconherient  . Ace Inhibitors Cough    Critical care time: 30 minutes of critical care time spent evaluating patient, reviewing records, formulating plan of care   Sherrilyn Rist, MD 10/24/2018 10:04 AM

## 2018-10-24 NOTE — Progress Notes (Signed)
STROKE TEAM PROGRESS NOTE   SUBJECTIVE (INTERVAL HISTORY) Pt lying in bed, sleepy but still has OSA pattern. Able to arouse but fall back to sleep, with stimulation, she will have combative behavior and says "I am fine".   OBJECTIVE Vitals:   10/24/18 0700 10/24/18 0745 10/24/18 0800 10/24/18 0815  BP: (!) 178/86 (!) 144/72  (!) 132/115  Pulse: 87 85  81  Resp: (!) 21 19  (!) 21  Temp:   100.1 F (37.8 C)   TempSrc:   Axillary   SpO2: 98% 95%  100%  Weight:      Height:        CBC:  Recent Labs  Lab 10/23/18 0911 10/23/18 1823 10/24/18 0603  WBC 7.3  --  7.3  HGB 10.8* 10.5* 11.1*  HCT 35.3* 31.0* 33.7*  MCV 95.1  --  93.4  PLT 142*  --  131*    Basic Metabolic Panel:  Recent Labs  Lab 10/23/18 0303 10/23/18 0911 10/23/18 1823 10/24/18 0603  NA  --  147* 152* 151*  K  --  3.5 3.3* 3.4*  CL  --  116*  --  116*  CO2  --  23  --  27  GLUCOSE  --  356*  --  174*  BUN  --  20  --  10  CREATININE  --  1.30*  --  1.11*  CALCIUM  --  8.6*  --  8.6*  MG 2.3  --   --  1.8  PHOS 2.8  --   --  3.0    Lipid Panel:     Component Value Date/Time   CHOL 131 10/22/2018 0550   TRIG 119 10/22/2018 0550   HDL 42 10/22/2018 0550   CHOLHDL 3.1 10/22/2018 0550   VLDL 24 10/22/2018 0550   LDLCALC 65 10/22/2018 0550   HgbA1c:  Lab Results  Component Value Date   HGBA1C 6.6 (H) 10/22/2018   Urine Drug Screen:     Component Value Date/Time   LABOPIA NONE DETECTED 10/21/2018 1640   COCAINSCRNUR NONE DETECTED 10/21/2018 1640   LABBENZ POSITIVE (A) 10/21/2018 1640   AMPHETMU NONE DETECTED 10/21/2018 1640   THCU NONE DETECTED 10/21/2018 1640   LABBARB NONE DETECTED 10/21/2018 1640    Alcohol Level     Component Value Date/Time   ETH <10 10/18/2018 1050    IMAGING Ct Head Wo Contrast 10/18/2018 IMPRESSION:  Right thalamic hemorrhage with intraventricular extension. Most of the blood is located in the left lateral ventricle.   Mr Brain Wo  Contrast 10/18/2018 IMPRESSION:  Severely motion degraded examination showing unchanged size of intraparenchymal hematoma centered in the right thalamus with intraventricular extension.   CT Angiogram Head  10/19/2018 IMPRESSION: 1. Size stable right thalamic and intraventricular clot with dilatation of the left temporal horn. 2. Tiny spot sign associated with the right thalamic hematoma, of questionable significance given 24 hour stability of intracranial hemorrhage. No underlying vascular malformation or aneurysm. 3. Small remote left thalamic and left cerebellar infarcts.  Ct Head Wo Contrast 10/20/2018 1. Unchanged size of intraparenchymal hematoma centered in the right thalamus. 2. Unchanged volume of intraventricular blood predominantly in the left lateral ventricle. 3. No new site of hemorrhage.   Ct Head Wo Contrast 10/22/2018 04:09  Unchanged right thalamic and intraventricular clot which dilates the temporal horn of the left lateral ventricle.   Ct Head Wo Contrast 10/22/2018 CLINICAL DATA:  Follow-up intracranial hemorrhage EXAM: CT HEAD WITHOUT CONTRAST TECHNIQUE: Contiguous  axial images were obtained from the base of the skull through the vertex without intravenous contrast. COMPARISON:  Two days ago FINDINGS: Brain: Unchanged hematoma in the right thalamus measuring up to 19 mm anterior-posterior. Intraventricular hemorrhage in the left more than right lateral ventricles with temporal horn dilatation and mild periventricular low-density on the left. Remote small vessel infarct in the left cerebellum and left thalamus. No evident infarct or shift. Vascular: No hyperdense vessel or unexpected calcification. Skull: No acute or aggressive finding Sinuses/Orbits: Left cataract resection and right ocular prosthesis. IMPRESSION: Unchanged right thalamic and intraventricular clot which dilates the temporal horn of the left lateral ventricle. Electronically Signed   By: Monte Fantasia M.D.   On:  10/22/2018 04:09    Transthoracic Echocardiogram  06/12/2018 Study Conclusions - Left ventricle: The cavity size was normal. Systolic function was   normal. The estimated ejection fraction was in the range of 60%   to 65%. Wall motion was normal; there were no regional wall   motion abnormalities. Abnormal relaxation with increased filling   pressures. - Aortic valve: There was no regurgitation. - Aortic root: The aortic root was normal in size. - Mitral valve: There was trivial regurgitation. - Right ventricle: Systolic function was normal. - Atrial septum: No defect or patent foramen ovale was identified. - Tricuspid valve: There was no significant regurgitation. - Inferior vena cava: The vessel was normal in size. The   respirophasic diameter changes were in the normal range (>= 50%),   consistent with normal central venous pressure. - Pericardium, extracardiac: A trivial pericardial effusion was   identified. Impressions: - Normal LV EF, no significant valvular abnormalities.  EKG - SR rate 92 BPM. (See cardiology reading for complete details)   PHYSICAL EXAM  General - obese, well developed, sleepy and lethargic.  Ophthalmologic - fundi not visualized due to noncooperation.  Cardiovascular - Regular rhythm, not in A. Fib.  Neuro - Patient is sleepy and lethargic, still agitated and combative with stimulation. She is able to say simple sentence, not following commands to answer orientation questions. Right ocular prosthesis, however, seems to have right hemianopia, no significant facial asymmetry, tongue midline in mouth.  Moving all extremities symmetrical now.  DTR not corporative, Sensation not cooperative, coordination no ataxia bilaterally by observation and gait not tested.   ASSESSMENT/PLAN [name] is a 53 y.o. female with history of type I diabetes, hypertension, paroxysmal A. fib on Coumadin, Turner syndrome and legal blindness presenting with AMS,  mildly hypertensive and combative. She did not receive IV t-PA due to hemorrhage.  Right thalamic hemorrhage with intraventricular extension, likely hypertensive in the setting of Coumadin coagulopathy s/p reversal  CT head - Right thalamic hemorrhage with left IVH.   MRI head - unchanged ICH centered in the right thalamus with intraventricular extension.   CTA Head - Size stable right thalamic and intraventricular clot with dilatation of the left temporal horn. Small remote left thalamic and left cerebellar infarcts.  No aneurysm, or AVM  Repeat CT 4/7 unchanged R thalamic and IVH w/ temporal horm dilation  CT repeat pending  2D Echo - 06/12/2018 - EF 60 - 65%.   LDL 65   HgbA1c - 6.6  UDS - positive for benzos   VTE prophylaxis - Heparin subcu  warfarin daily prior to admission, INR 2.6, reversed with vitamin K and Kcentra.  Now on No antithrombotic given hmg  NSG consulted, no intervention needed at this time  Therapy recommendations:  CIR, RW  5" wheels  Disposition:  Pending  DKA with type 1 diabetes  HgbA1c 5.7, at goal < 7.0  Switch from insulin IV to Lantus  Currently NPO  Now on lantus 12 bid   SSI 0-9  CBG monitoring  PAF  Currently in NSR  On Coumadin at home  INR 2.6 on admission, therapeutic -> reversed with Kcentra and vitamin K  INR now 1.1  Rate controlled  Hold off antithrombotic for now due to [place]  AKI with hypernatremia  AKI - creatinine -1.7->2.11->1.54->1.30->1.11  Na -154-155-154-159->147->151  NS @ 75 -> 1/2NS @75   Hypertension  Stable 130-170s  Back on Cleviprex . SBP goal < 160 mmHg . Home meds:  cardizem 240, cozaar 50 bid . BP meds on held due to NPO . Long-term BP goal normotensive   Hyperlipidemia  Lipid lowering medication PTA: Crestor 20 mg daily  LDL 65  Current lipid lowering medication: Crestor 20  Resume statin at discharge  OSA, apnea, desaturations  As per husband, patient had OSA evaluation  1 year ago, no need CPAP at home  Intermittent desaturation during sleep on this admission  Not CPAP candidate due to agitation, combative, and restrain  CCM on board  Dysphagia  Secondary to stroke  Was on D3 nectar thick diet - but now concerning for silent aspiration  SLP following  MBS delayed d/t agitation  Currently NPO  Other Stroke Risk Factors  Former cigarette smoker - quit 30 years ago  ETOH use, advised to drink no more than 1 alcoholic beverage per day.  Obesity, Body mass index is 31.34 kg/m., recommend weight loss, diet and exercise as appropriate   Previous strokes by imaging - Small remote left thalamic and left cerebellar infarcts.  Other Active Problems  INR - 2.6 on admission - reversed -> 1.1  Anemia of chronic disease- 12.1->10.8->11.1  Leukocytosis, resolved - 21.7->20->10.2 (afebrile)->7.3  Agitation/encephalopathy, improving - off precedex - avoid benzos/opiates  Hospital day # 6  This patient is critically ill due to [place], IVH, DKA, PAF, dysphagia, OSA and at significant risk of neurological worsening, death form recurrent bleeding, hydrocephalus, heart failure, DKA, seizure. This patient's care requires constant monitoring of vital signs, hemodynamics, respiratory and cardiac monitoring, review of multiple databases, neurological assessment, discussion with family, other specialists and medical decision making of high complexity. I had long discussion with husband [name] over the phone, updated pt current condition, treatment plan and potential prognosis. He expressed understanding and appreciation. I spent 35 minutes of neurocritical care time in the care of this patient.  [name], MD PhD Stroke Neurology 10/24/2018 9:22 AM   To contact Stroke Continuity provider, please refer to [email or web address] After hours, contact General Neurology

## 2018-10-24 NOTE — Progress Notes (Addendum)
1200:  Patient alert and stated that she wanted to go home.  Much more alert than in the morning.  Neurology paged and came to bedside to talk with patient and family member via telephone.  1600:  Patient is back to being lethargic more like in the early am of this shift.  Neurology paged and will order EEG.  RN will continue to monitor.     1700:  Patient is agitated and EEG tech is unable to do EEG.  Neurology paged.  New orders received. RN will continue to monitor.

## 2018-10-24 NOTE — Progress Notes (Signed)
SLP Cancellation Note  Patient Details Name: Tina [name] MRN: [id number] DOB: 06/07/1966   Cancelled treatment:       Reason Eval/Treat Not Completed: Patient's level of consciousness;Fatigue/lethargy limiting ability to participate. Patient continues to exhibit lethargy and reports of combativeness. Will check for readiness for MBS next date.   Sonia Baller, MA, CCC-SLP Speech Therapy [facility] Pager: [phone]

## 2018-10-24 NOTE — Progress Notes (Addendum)
Inpatient Diabetes Program Recommendations  AACE/ADA: New Consensus Statement on Inpatient Glycemic Control  Target Ranges:  Prepandial:   less than 140 mg/dL      Peak postprandial:   less than 180 mg/dL (1-2 hours)      Critically ill patients:  140 - 180 mg/dL   Results for Tina [name], Tina [name] (MRN [id number]) as of 10/24/2018 10:40  Ref. Range 10/23/2018 08:04 10/23/2018 12:01 10/23/2018 16:43 10/23/2018 16:44 10/23/2018 17:17 10/23/2018 20:13 10/24/2018 00:00 10/24/2018 03:56 10/24/2018 07:43  Glucose-Capillary Latest Ref Range: 70 - 99 mg/dL 237 (H)  Novolog 9 units  Lantus 12 units 180 (H)  Novolog 7 units 77 68 (L) 113 (H) 148 (H)  Novolog 1 unit  Lantus 7 units 208 (H)  Novolog 3 units 202 (H)  Novolog 3 units 152 (H)  Novolog 2 units  Lantus 7 units    Review of Glycemic Control  Diabetes history:Type 1 DM (makes NO insulin; requires basal, correction, and carbohydrate coverage insulin) Outpatient Diabetes medications:Medtronic insulin pump Current orders for Inpatient glycemic control:Novolog 0-9 units Q4H, Lantus 7 units BID  Inpatient Diabetes Program Recommendations:  Insulin-Basal: Please consider increasing Lantus 10 units BID.  NOTE: Per Deboraha Sprang, RN, Diabetes Coordinator note on 10/21/18, patient was receiving a total of 33.3 units per day from insulin pump for basal needs.   Thanks, Barnie Alderman, RN, MSN, CDE Diabetes Coordinator Inpatient Diabetes Program [phone] (Team Pager from 8am to 5pm)

## 2018-10-25 ENCOUNTER — Inpatient Hospital Stay (HOSPITAL_COMMUNITY): Payer: HMO

## 2018-10-25 LAB — BASIC METABOLIC PANEL
Anion gap: 9 (ref 5–15)
BUN: 7 mg/dL (ref 6–20)
CO2: 26 mmol/L (ref 22–32)
Calcium: 8.5 mg/dL — ABNORMAL LOW (ref 8.9–10.3)
Chloride: 111 mmol/L (ref 98–111)
Creatinine, Ser: 0.97 mg/dL (ref 0.44–1.00)
GFR calc Af Amer: >60 mL/min (ref >60)
GFR calc non Af Amer: >60 mL/min (ref >60)
Glucose, Bld: 120 mg/dL — ABNORMAL HIGH (ref 70–99)
Potassium: 3.1 mmol/L — ABNORMAL LOW (ref 3.5–5.1)
Sodium: 146 mmol/L — ABNORMAL HIGH (ref 135–145)

## 2018-10-25 LAB — GLUCOSE, CAPILLARY
Glucose-Capillary: 125 mg/dL — ABNORMAL HIGH (ref 70–99)
Glucose-Capillary: 190 mg/dL — ABNORMAL HIGH (ref 70–99)
Glucose-Capillary: 234 mg/dL — ABNORMAL HIGH (ref 70–99)
Glucose-Capillary: 286 mg/dL — ABNORMAL HIGH (ref 70–99)
Glucose-Capillary: 287 mg/dL — ABNORMAL HIGH (ref 70–99)
Glucose-Capillary: 53 mg/dL — ABNORMAL LOW (ref 70–99)
Glucose-Capillary: 86 mg/dL (ref 70–99)

## 2018-10-25 LAB — CBC
HCT: 35.5 % — ABNORMAL LOW (ref 36.0–46.0)
Hemoglobin: 11.4 g/dL — ABNORMAL LOW (ref 12.0–15.0)
MCH: 29.3 pg (ref 26.0–34.0)
MCHC: 32.1 g/dL (ref 30.0–36.0)
MCV: 91.3 fL (ref 80.0–100.0)
Platelets: 140 10*3/uL — ABNORMAL LOW (ref 150–400)
RBC: 3.89 MIL/uL (ref 3.87–5.11)
RDW: 13.2 % (ref 11.5–15.5)
WBC: 8.5 10*3/uL (ref 4.0–10.5)
nRBC: 0 % (ref 0.0–0.2)

## 2018-10-25 MED ORDER — ADULT MULTIVITAMIN LIQUID CH
15.0000 mL | Freq: Every day | ORAL | Status: DC
  Filled 2018-10-25: qty 15

## 2018-10-25 MED ORDER — DEXTROSE 50 % IV SOLN
25.0000 g | INTRAVENOUS | Status: AC
  Administered 2018-10-25: 25 g via INTRAVENOUS
  Filled 2018-10-25: qty 50

## 2018-10-25 NOTE — Progress Notes (Signed)
  Speech Language Pathology Treatment: Dysphagia  Patient Details Name: Tina [name] MRN: [id number] DOB: 27-Jan-1966 Today's Date: 10/25/2018 Time: 1330-1350 SLP Time Calculation (min) (ACUTE ONLY): 20 min  Assessment / Plan / Recommendation Clinical Impression  Pt seen at bedside during lunch to assess tolerance of dys 2 diet with nectar thick liquids. Pt was confused, but cooperative. Pt demonstrated slow mastication and prolonged bolus formation of chopped solids and puree. No significant oral residue noted. Pt took sips of nectar thick liquids without overt s/s of aspiration. Pt was easily distractible and required frequent cues, but was less impulsive with eating and drinking than previous sessions. Pt continues to demonstrate concern for aspiration due to neuro diagnosis and left lobe pneumonia. Will follow up to assess diet tolerance and possibly upgrade.    HPI HPI: 53 year old female admitted 10/18/2018 with AMS. PMH: DM, HTN, Paroxysmal A Fib, Turner syndrome, legal blindness. Head CT = right thalamic hemorrhage with intraventricular extension. Most of the blood is in the left lateral ventricle.      SLP Plan  Continue with current plan of care       Recommendations  Diet recommendations: Dysphagia 2 (fine chop);Nectar-thick liquid Liquids provided via: Cup;Straw Medication Administration: Whole meds with liquid Supervision: Full supervision/cueing for compensatory strategies Compensations: Minimize environmental distractions;Slow rate;Small sips/bites Postural Changes and/or Swallow Maneuvers: Seated upright 90 degrees                Oral Care Recommendations: Oral care BID Follow up Recommendations: Inpatient Rehab SLP Visit Diagnosis: Dysphagia, unspecified (R13.10) Plan: Continue with current plan of care       GO                Laurey Arrow 10/25/2018, 2:23 PM  Tina [name], M.Ed., CCC-SLP 10/25/18 2:31 PM

## 2018-10-25 NOTE — Procedures (Signed)
ELECTROENCEPHALOGRAM REPORT   Patient: Tina [name]       Room #: 4N31C EEG No. ID: [id number] Age: 53 y.o.        Sex: female Referring Physician: Erlinda Hong Report Date:  10/25/2018        Interpreting Physician: Alexis Goodell  History: Ndia [name] is an 53 y.o. female with ICH and mental status changes  Medications:  Cleviprex, Lopressor, Crestor, Cozaar, Insulin, Pepcid, Cardizem  Conditions of Recording:  This is a 21 channel routine scalp EEG performed with bipolar and monopolar montages arranged in accordance to the international 10/20 system of electrode placement. One channel was dedicated to EKG recording.  The patient is in the awake and uncooperative state.  Description:  The waking background activity consists of a low voltage, symmetrical, fairly well organized, 6-7 Hz theta activity, seen from the parieto-occipital and posterior temporal regions.  Low voltage fast activity, poorly organized, is seen anteriorly and is at times superimposed on more posterior regions.  A mixture of theta and alpha rhythms are seen from the central and temporal regions. The patient does not drowse or sleep. No epileptiform activity is noted.   Hyperventilation and intermittent photic stimulation were not performed.  IMPRESSION: This is an abnormal EEG secondary to posterior background slowing.  This finding may be seen with a diffuse gray matter disturbance that is etiologically nonspecific, but may include a dementia, among other possibilities.  No epileptiform activity is noted.     Alexis Goodell, MD Neurology [phone] 10/25/2018, 5:59 PM

## 2018-10-25 NOTE — Progress Notes (Signed)
  Speech Language Pathology Treatment: Cognitive-Linquistic  Patient Details Name: Chrisa Hassan MRN: [id number] DOB: [birth year] Today's Date: 10/25/2018 Time: 3202-3343 SLP Time Calculation (min) (ACUTE ONLY): 14 min  Assessment / Plan / Recommendation Clinical Impression  Pt was seen for cognitive-linguistic treatment. She was alert and cooperative throughout the session but exhibited difficulty attending to any structured tasks despite redirection. She demonstrated 0% accuracy with simple reasoning tasks despite max cues. She was able to provide her last name but was not oriented to time or place despite mod-max cues. Max cues were provided for simple problem solving tasks but this did not improve pt's accuracy. SLP will continue to follow for dysphagia and cognition.    HPI HPI: 53 year old female admitted 10/18/2018 with AMS. PMH: DM, HTN, Paroxysmal A Fib, Turner syndrome, legal blindness. Head CT = right thalamic hemorrhage with intraventricular extension. Most of the blood is in the left lateral ventricle.      SLP Plan  Continue with current plan of care       Recommendations  Diet recommendations: Dysphagia 2 (fine chop);Nectar-thick liquid Liquids provided via: Cup;Straw Medication Administration: Whole meds with liquid Supervision: Full supervision/cueing for compensatory strategies Compensations: Minimize environmental distractions;Slow rate;Small sips/bites Postural Changes and/or Swallow Maneuvers: Seated upright 90 degrees                Oral Care Recommendations: Oral care BID Follow up Recommendations: Inpatient Rehab SLP Visit Diagnosis: Cognitive communication deficit (H68.616) Plan: Continue with current plan of care       Ladarien Beeks I. Hardin Negus, [place], [place] Office number [phone] Pager [place] 10/25/2018, 5:33 PM

## 2018-10-25 NOTE — Progress Notes (Signed)
NAME:  Tina [name], MRN:  [id number], DOB:  04-Sep-1965, LOS: 7 ADMISSION DATE:  10/18/2018, CONSULTATION DATE:  10/20/2018 REFERRING MD:  Dr Erlinda Hong, CHIEF COMPLAINT:  DKA   Brief History   53 year old obese female with hx of Turner's syndrome, OSA on CPAP at home and type 1 DM. Admitted 10/18/2018 for Intracranial bleed . Course complicated by agitation and compounded by nPO status, developed DKA -treated.   Past Medical History   has a past medical history of Arthritis, Cataract, Diabetic retinopathy ([place]), Diverticulosis, DM (diabetes mellitus), type 1 ([place]), Dyspnea, Elevated LFTs, Hyperlipidemia, Hypertension, Internal hemorrhoids, Legally blind in right eye, as defined in [place], Osteoporosis (11/2017), Retinal detachment, Tachycardia, Tubular adenoma of colon, Turner's syndrome, and Visual loss, bilateral.  Significant Hospital Events   10/18/2018= admit 4/5 - DKA ccm consult  Consults:  4/5 - ccm consult  Procedures:    Significant Diagnostic Tests:  Chest x-ray on 10/20/2018-basal atelectasis  Antimicrobials:    Interim history/subjective:  Continues to have intermittent confusion, waxing and waning wakefulness Haldol given with some improvement in delirium    Objective   Blood pressure (!) 104/50, pulse 63, temperature 98.3 F (36.8 C), temperature source Oral, resp. rate (!) 21, height 4\' 9"  (1.448 m), weight 65.7 kg, SpO2 94 %.        Intake/Output Summary (Last 24 hours) at 10/25/2018 1219 Last data filed at 10/25/2018 1000 Gross per 24 hour  Intake 1831.67 ml  Output 400 ml  Net 1431.67 ml   Filed Weights   10/18/18 1104 10/18/18 1700  Weight: 68.5 kg 65.7 kg   Examination:  Gen: Ill-appearing woman but in no distress, hypersomnolent, Turner's body habitus and face ease HEENT: Oropharynx clear, no oral lesions.  Intermittent snoring Lungs: Overall clear, no wheeze, decreased at both bases some intermittent snoring CV: Regular, no murmur Abd: Soft, obese,  nondistended with positive bowel sounds Ext: No significant lower extremity edema Neuro: She will wake up and interact but becomes immediately somnolent again. Poorly oriented, cannot answer questions appropriately.  Resolved Hospital Problem list     Assessment & Plan:   Type 1 diabetes, DKA resolved -Was started on a diet but concerns for aspirations-held Continue sliding scale insulin as ordered.  Hopefully enteral intake will be possible soon Ultimately will transition back to her home insulin pump  Encephalopathy, multifactorial Agitated delirium Attempting to avoid sedating medications as able Haldol started 4/9 with some effectiveness Once she is taking enteral medications then can consider Seroquel  History of obstructive sleep apnea -No study on record showing obstructive sleep apnea Continue CPAP nightly, auto titration device  Dysphagia: Remains n.p.o. pending further SLP evaluation   Hypertension HLD Has intermittently required Cleviprex Continue diltiazem, losartan, metoprolol Continue rosuvastatin  Intracerebral hemorrhage Off anticoagulation Stroke service managing, any further imaging as per their plans  Acute kidney injury, resolved Hypernatremia Decrease 1/2NS to 30cc/h    Best practice:  Diet: dysphagia 2 Pain/Anxiety/Delirium protocol (if indicated):NA VAP protocol (if indicated): n/a DVT prophylaxis: SCD GI prophylaxis: pepcid Glucose control: DM coordinator following. Mobility: Bedrest Code Status: Full code Family Communication: discussed with pt.  Disposition: ICU  Labs   CBC: Recent Labs  Lab 10/21/18 0528 10/22/18 0550 10/23/18 0911 10/23/18 1823 10/24/18 0603 10/25/18 0736  WBC 20.0* 10.2 7.3  --  7.3 8.5  HGB 11.9* 12.1 10.8* 10.5* 11.1* 11.4*  HCT 37.1 38.4 35.3* 31.0* 33.7* 35.5*  MCV 95.4 95.8 95.1  --  93.4 91.3  PLT 249  195 142*  --  131* 140*    Basic Metabolic Panel: Recent Labs  Lab 10/21/18 0528 10/22/18  0550 10/23/18 0303 10/23/18 0911 10/23/18 1823 10/24/18 0603 10/25/18 0736  NA 154* 159*  --  147* 152* 151* 146*  K 4.2 4.3  --  3.5 3.3* 3.4* 3.1*  CL 123* 128*  --  116*  --  116* 111  CO2 16* 27  --  23  --  27 26  GLUCOSE 266* 335*  --  356*  --  174* 120*  BUN 48* 35*  --  20  --  10 7  CREATININE 2.11* 1.54*  --  1.30*  --  1.11* 0.97  CALCIUM 9.3 9.2  --  8.6*  --  8.6* 8.5*  MG 2.4 2.4 2.3  --   --  1.8  --   PHOS 3.9 3.4 2.8  --   --  3.0  --    GFR: Estimated Creatinine Clearance: 52.9 mL/min (by C-G formula based on SCr of 0.97 mg/dL). Recent Labs  Lab 10/20/18 2235  10/22/18 0550 10/23/18 0911 10/24/18 0603 10/25/18 0736  WBC  --    < > 10.2 7.3 7.3 8.5  LATICACIDVEN 1.4  --   --   --   --   --    < > = values in this interval not displayed.    Liver Function Tests: Recent Labs  Lab 10/21/18 0528  AST 61*  ALT 70*  ALKPHOS 99  BILITOT 1.5*  PROT 6.1*  ALBUMIN 3.1*   No results for input(s): LIPASE, AMYLASE in the last 168 hours. No results for input(s): AMMONIA in the last 168 hours.  ABG    Component Value Date/Time   PHART 7.428 10/23/2018 1823   PCO2ART 38.5 10/23/2018 1823   PO2ART 78.0 (L) 10/23/2018 1823   HCO3 25.4 10/23/2018 1823   TCO2 27 10/23/2018 1823   ACIDBASEDEF 2.0 10/20/2018 1154   O2SAT 96.0 10/23/2018 1823     Coagulation Profile: Recent Labs  Lab 10/18/18 1413 10/18/18 2213 10/19/18 0246 10/20/18 0356 10/21/18 0528  INR 1.0 1.2 1.1 1.1 1.1    Cardiac Enzymes: No results for input(s): CKTOTAL, CKMB, CKMBINDEX, TROPONINI in the last 168 hours.  HbA1C: Hgb A1c MFr Bld  Date/Time Value Ref Range Status  10/22/2018 05:50 AM 6.6 (H) 4.8 - 5.6 % Final    Comment:    (NOTE) Pre diabetes:          5.7%-6.4% Diabetes:              >6.4% Glycemic control for   <7.0% adults with diabetes   06/13/2018 05:59 AM 5.7 (H) 4.8 - 5.6 % Final    Comment:    (NOTE) Pre diabetes:          5.7%-6.4% Diabetes:               >6.4% Glycemic control for   <7.0% adults with diabetes     CBG: Recent Labs  Lab 10/24/18 2338 10/25/18 0330 10/25/18 0402 10/25/18 0824 10/25/18 1107  GLUCAP 128* 53* 190* 86 125*     Baltazar Apo, MD, PhD 10/25/2018, 12:37 PM [facility] [phone] or if no answer [phone]

## 2018-10-25 NOTE — Progress Notes (Signed)
STROKE TEAM PROGRESS NOTE   SUBJECTIVE (INTERVAL HISTORY) Pt lying in bed, much calmer than yesterday. No agitation, passed swallow and on nectar thick liquid. Glucose fluctuates. EEG done pending.   OBJECTIVE Vitals:   10/25/18 1115 10/25/18 1130 10/25/18 1145 10/25/18 1200  BP: (!) 115/53 100/69 (!) 116/53 (!) 120/58  Pulse: (!) 59 61 (!) 58 (!) 58  Resp: 16 20 (!) 21 16  Temp:    98.3 F (36.8 C)  TempSrc:    Oral  SpO2: 94% 90% 98% 96%  Weight:      Height:        CBC:  Recent Labs  Lab 10/24/18 0603 10/25/18 0736  WBC 7.3 8.5  HGB 11.1* 11.4*  HCT 33.7* 35.5*  MCV 93.4 91.3  PLT 131* 140*    Basic Metabolic Panel:  Recent Labs  Lab 10/23/18 0303  10/24/18 0603 10/25/18 0736  NA  --    < > 151* 146*  K  --    < > 3.4* 3.1*  CL  --    < > 116* 111  CO2  --    < > 27 26  GLUCOSE  --    < > 174* 120*  BUN  --    < > 10 7  CREATININE  --    < > 1.11* 0.97  CALCIUM  --    < > 8.6* 8.5*  MG 2.3  --  1.8  --   PHOS 2.8  --  3.0  --    < > = values in this interval not displayed.    Lipid Panel:     Component Value Date/Time   CHOL 131 10/22/2018 0550   TRIG 119 10/22/2018 0550   HDL 42 10/22/2018 0550   CHOLHDL 3.1 10/22/2018 0550   VLDL 24 10/22/2018 0550   LDLCALC 65 10/22/2018 0550   HgbA1c:  Lab Results  Component Value Date   HGBA1C 6.6 (H) 10/22/2018   Urine Drug Screen:     Component Value Date/Time   LABOPIA NONE DETECTED 10/21/2018 1640   COCAINSCRNUR NONE DETECTED 10/21/2018 1640   LABBENZ POSITIVE (A) 10/21/2018 1640   AMPHETMU NONE DETECTED 10/21/2018 1640   THCU NONE DETECTED 10/21/2018 1640   LABBARB NONE DETECTED 10/21/2018 1640    Alcohol Level     Component Value Date/Time   ETH <10 10/18/2018 1050    IMAGING Ct Head Wo Contrast 10/18/2018 IMPRESSION:  Right thalamic hemorrhage with intraventricular extension. Most of the blood is located in the left lateral ventricle.   Mr Brain Wo Contrast 10/18/2018 IMPRESSION:   Severely motion degraded examination showing unchanged size of intraparenchymal hematoma centered in the right thalamus with intraventricular extension.   CT Angiogram Head  10/19/2018 IMPRESSION: 1. Size stable right thalamic and intraventricular clot with dilatation of the left temporal horn. 2. Tiny spot sign associated with the right thalamic hematoma, of questionable significance given 24 hour stability of intracranial hemorrhage. No underlying vascular malformation or aneurysm. 3. Small remote left thalamic and left cerebellar infarcts.  Ct Head Wo Contrast 10/20/2018 1. Unchanged size of intraparenchymal hematoma centered in the right thalamus. 2. Unchanged volume of intraventricular blood predominantly in the left lateral ventricle. 3. No new site of hemorrhage.   Ct Head Wo Contrast 10/22/2018 04:09  Unchanged right thalamic and intraventricular clot which dilates the temporal horn of the left lateral ventricle.   Ct Head Wo Contrast 10/22/2018 CLINICAL DATA:  Follow-up intracranial hemorrhage EXAM: CT HEAD WITHOUT  CONTRAST TECHNIQUE: Contiguous axial images were obtained from the base of the skull through the vertex without intravenous contrast. COMPARISON:  Two days ago FINDINGS: Brain: Unchanged hematoma in the right thalamus measuring up to 19 mm anterior-posterior. Intraventricular hemorrhage in the left more than right lateral ventricles with temporal horn dilatation and mild periventricular low-density on the left. Remote small vessel infarct in the left cerebellum and left thalamus. No evident infarct or shift. Vascular: No hyperdense vessel or unexpected calcification. Skull: No acute or aggressive finding Sinuses/Orbits: Left cataract resection and right ocular prosthesis. IMPRESSION: Unchanged right thalamic and intraventricular clot which dilates the temporal horn of the left lateral ventricle. Electronically Signed   By: Monte Fantasia M.D.   On: 10/22/2018 04:09     Transthoracic Echocardiogram  06/12/2018 Study Conclusions - Left ventricle: The cavity size was normal. Systolic function was   normal. The estimated ejection fraction was in the range of 60%   to 65%. Wall motion was normal; there were no regional wall   motion abnormalities. Abnormal relaxation with increased filling   pressures. - Aortic valve: There was no regurgitation. - Aortic root: The aortic root was normal in size. - Mitral valve: There was trivial regurgitation. - Right ventricle: Systolic function was normal. - Atrial septum: No defect or patent foramen ovale was identified. - Tricuspid valve: There was no significant regurgitation. - Inferior vena cava: The vessel was normal in size. The   respirophasic diameter changes were in the normal range (>= 50%),   consistent with normal central venous pressure. - Pericardium, extracardiac: A trivial pericardial effusion was   identified. Impressions: - Normal LV EF, no significant valvular abnormalities.  EKG - SR rate 92 BPM. (See cardiology reading for complete details)   PHYSICAL EXAM  General - obese, well developed, lethargic.  Ophthalmologic - fundi not visualized due to noncooperation.  Cardiovascular - Regular rhythm, not in A. Fib.  Neuro - Patient is lethargic, but eyes open and calm without agitation. She is able to speak some words but paucity of speech, not following commands to answer orientation questions. Right ocular prosthesis, however, seems to have right hemianopia of left eye, no significant facial asymmetry, tongue midline in mouth.  Moving all extremities symmetrical now.  DTR not corporative, Sensation not cooperative, coordination no ataxia bilaterally by observation and gait not tested.   ASSESSMENT/PLAN Ms. Tamber Valenti-Cohen is a 53 y.o. female with history of type I diabetes, hypertension, paroxysmal A. fib on Coumadin, Turner syndrome and legal blindness presenting with AMS, mildly  hypertensive and combative. She did not receive IV t-PA due to hemorrhage.  Right thalamic hemorrhage with intraventricular extension, likely hypertensive in the setting of Coumadin coagulopathy s/p reversal  CT head - Right thalamic hemorrhage with left IVH.   MRI head - unchanged ICH centered in the right thalamus with intraventricular extension.   CTA Head - Size stable right thalamic and intraventricular clot with dilatation of the left temporal horn. Small remote left thalamic and left cerebellar infarcts.  No aneurysm, or AVM  Repeat CT 4/7 unchanged R thalamic and IVH w/ temporal horm dilation  CT repeat 4/9 slight interval decrease R thalamic hmg. Slight decrease caliber of ventricles.   2D Echo - 06/12/2018 - EF 60 - 65%.   EEG pending   LDL 65   HgbA1c - 6.6  UDS - positive for benzos   VTE prophylaxis - Heparin subcu  warfarin daily prior to admission, INR 2.6, reversed with vitamin K and  Kcentra.  Now on No antithrombotic given hmg  NSG consulted, no intervention needed at this time  Therapy recommendations:  CIR, RW 5" wheels  Disposition:  Pending  DKA with type 1 diabetes  HgbA1c 5.7, at goal < 7.0  Switch from insulin IV to Lantus  D2 nectar thick  Now on lantus 12 bid   SSI 0-9  CBG monitoring  PAF  Currently in NSR  On Coumadin at home  INR 2.6 on admission, therapeutic -> reversed with Kcentra and vitamin K  INR now 1.1  Rate controlled  Hold off antithrombotic for now due to [place]  AKI with hypernatremia  AKI - creatinine -1.7->2.11->1.54->1.30->1.11->0.97  Na -154-155-154-159->147->151-146  On 1/2NS @30   Encourage po intake  Hypertension  Stable   on Cleviprex . SBP goal < 160 mmHg . Home meds:  cardizem 240, cozaar 50 bid . Currently on cardizem 240, cozaar 50 bid and metoprolol 50 bid . Long-term BP goal normotensive   Hyperlipidemia  Lipid lowering medication PTA: Crestor 20 mg daily  LDL 65  Current lipid  lowering medication: Crestor 20  Resume statin at discharge  OSA, apnea, desaturations  As per husband, patient had OSA evaluation 1 year ago, no need CPAP at home  Intermittent desaturation during sleep on this admission  On CPAP at hs  CCM on board  Dysphagia  Secondary to stroke  SLP following  MBS cleared for diet  On D2 nectar thick diet  Other Stroke Risk Factors  Former cigarette smoker - quit 30 years ago  ETOH use, advised to drink no more than 1 alcoholic beverage per day.  Obesity, Body mass index is 31.34 kg/m., recommend weight loss, diet and exercise as appropriate   Previous strokes by imaging - Small remote left thalamic and left cerebellar infarcts.  Other Active Problems  INR - 2.6 on admission - reversed -> 1.1  Anemia of chronic disease- 12.1->10.8->11.1->11.4  Leukocytosis, resolved - 21.7->20->10.2 (afebrile)->7.3->8.5  Agitation/encephalopathy, improving - off precedex - avoid benzos/opiates  Hospital day # 7  This patient is critically ill due to [place], IVH, DKA, PAF, dysphagia, OSA and at significant risk of neurological worsening, death form recurrent bleeding, hydrocephalus, heart failure, DKA, seizure. This patient's care requires constant monitoring of vital signs, hemodynamics, respiratory and cardiac monitoring, review of multiple databases, neurological assessment, discussion with family, other specialists and medical decision making of high complexity. I had long discussion with husband [name] over the phone, updated pt current condition, treatment plan and potential prognosis. He expressed understanding and appreciation. I spent 35 minutes of neurocritical care time in the care of this patient.  [name], MD PhD Stroke Neurology 10/25/2018 1:09 PM   To contact Stroke Continuity provider, please refer to [email or web address] After hours, contact General Neurology

## 2018-10-25 NOTE — Progress Notes (Signed)
EEG completed, results pending. 

## 2018-10-25 NOTE — Progress Notes (Addendum)
Occupational Therapy Treatment Patient Details Name:  Shorkey MRN: [id number] DOB: 1965/11/10 Today's Date: 10/25/2018    History of present illness 53 year old female admitted 10/18/2018 with AMS.  Head CT = right thalamic hemorrhage with intraventricular extension. Most of the blood is in the left lateral ventricle. PMHx: DM, HTN, Paroxysmal A Fib, Turner syndrome, legal blindness.   OT comments  Pt presents supine in bed agreeble to working with therapies. Pt continues to demonstrate impaired cognition, requiring increased time and cues to follow commands. Pt completing grooming ADL combing hair while seated EOB given time and questioning cues to initiate proper use of ADL item, performing task with close minguard assist for sitting balance. Pt continues to require two person HHA for safe completion of functional mobility. Updated discharge recommendations as feel she is appropriate for CIR level services at time of discharge. Will continue to follow acutely to progress pt towards established OT goals.   Follow Up Recommendations  CIR;Supervision/Assistance - 24 hour    Equipment Recommendations  Other (comment)(defer to next venue)          Precautions / Restrictions Precautions Precautions: Fall Restrictions Weight Bearing Restrictions: No       Mobility Bed Mobility Overal bed mobility: Needs Assistance Bed Mobility: Supine to Sit;Sit to Supine     Supine to sit: Mod assist Sit to supine: Mod assist   General bed mobility comments: ModA for supine <> sit for trunk control  Transfers Overall transfer level: Needs assistance Equipment used: None Transfers: Sit to/from Stand Sit to Stand: Mod assist;+2 safety/equipment         General transfer comment: ModA +2 for standing from edge of bed. Use of step stool for scooting hips due to pt short stature and leg length    Balance Overall balance assessment: Needs assistance Sitting-balance support: No upper  extremity supported;Feet supported Sitting balance-Leahy Scale: Fair Sitting balance - Comments: Min guard assist for combing hair on edge of bed   Standing balance support: Bilateral upper extremity supported Standing balance-Leahy Scale: Poor                             ADL either performed or assessed with clinical judgement   ADL Overall ADL's : Needs assistance/impaired     Grooming: Minimal assistance;Moderate assistance;Sitting Grooming Details (indicate cue type and reason): pt requires increased cues to locate comb in therapist's hands as well as to identify comb and to specify what comb is used for. given increased time and questioning cues pt initiates using comb to comb hair, provided some assist for thoroughness as pt with matted hair in some areas                             Functional mobility during ADLs: Moderate assistance;+2 for physical assistance;+2 for safety/equipment General ADL Comments: Pt continues to present with poor cognition, initation, awareness, arousal, balance, and strength     Vision       Perception     Praxis      Cognition Arousal/Alertness: Awake/alert Behavior During Therapy: Flat affect Overall Cognitive Status: Impaired/Different from baseline Area of Impairment: Following commands;Attention;Memory;Problem solving                   Current Attention Level: Focused Memory: Decreased short-term memory Following Commands: Follows one step commands inconsistently;Follows one step commands with increased time  Problem Solving: Slow processing;Decreased initiation;Requires verbal cues;Requires tactile cues General Comments: Pt easily distracted but able to be redirected towards task. Pt asking to check if her husband was under the bed        Exercises     Shoulder Instructions       General Comments VSS    Pertinent Vitals/ Pain       Pain Assessment: Faces Faces Pain Scale: Hurts a little  bit Pain Location: back sitting EOB Pain Descriptors / Indicators: Grimacing Pain Intervention(s): Limited activity within patient's tolerance;Monitored during session;Repositioned  Home Living                                          Prior Functioning/Environment              Frequency  Min 2X/week        Progress Toward Goals  OT Goals(current goals can now be found in the care plan section)  Progress towards OT goals: Progressing toward goals  Acute Rehab OT Goals Patient Stated Goal: "drink water." OT Goal Formulation: With patient Time For Goal Achievement: 11/02/18 Potential to Achieve Goals: Good  Plan Discharge plan needs to be updated    Co-evaluation    PT/OT/SLP Co-Evaluation/Treatment: Yes Reason for Co-Treatment: Necessary to address cognition/behavior during functional activity;For patient/therapist safety PT goals addressed during session: Mobility/safety with mobility;Balance OT goals addressed during session: ADL's and self-care      AM-PAC OT "6 Clicks" Daily Activity     Outcome Measure   Help from another person eating meals?: A Lot Help from another person taking care of personal grooming?: A Lot Help from another person toileting, which includes using toliet, bedpan, or urinal?: Total Help from another person bathing (including washing, rinsing, drying)?: A Lot Help from another person to put on and taking off regular upper body clothing?: A Lot Help from another person to put on and taking off regular lower body clothing?: Total 6 Click Score: 10    End of Session Equipment Utilized During Treatment: Gait belt;Oxygen  OT Visit Diagnosis: Unsteadiness on feet (R26.81);Other abnormalities of gait and mobility (R26.89);Muscle weakness (generalized) (M62.81);Other symptoms and signs involving cognitive function   Activity Tolerance Patient tolerated treatment well   Patient Left in bed;with call bell/phone within  reach;with bed alarm set   Nurse Communication Mobility status        Time: 2956-2130 OT Time Calculation (min): 31 min  Charges: OT General Charges $OT Visit: 1 Visit OT Treatments $Self Care/Home Management : 8-22 mins  Lou Cal, [place] Pager [phone] Office [place] 10/25/2018, 3:12 PM

## 2018-10-25 NOTE — Progress Notes (Signed)
Inpatient Rehabilitation-Admissions Coordinator   [facility] continues to follow for improved tolerance, participation, and medical readiness. Will follow up Monday.   Jhonnie Garner, OTR/L  Rehab Admissions Coordinator  [phone] 10/25/2018 2:08 PM

## 2018-10-25 NOTE — Progress Notes (Signed)
Per RN Caryl Pina pt is going to another procedure and unavailable at this time. Will check back this afternoon to see if patient is able to have EEG done.

## 2018-10-25 NOTE — Progress Notes (Signed)
RT unable to place pt on CPAP for the night. Pt has restrains on.  Pt is on 4L [place], SpO2 is 97%.  No distress noted.

## 2018-10-25 NOTE — Progress Notes (Signed)
Physical Therapy Treatment Patient Details Name: Tina [name] MRN: [id number] DOB: Jul 01, 1966 Today's Date: 10/25/2018    History of Present Illness 53 year old female admitted 10/18/2018 with AMS.  Head CT = right thalamic hemorrhage with intraventricular extension. Most of the blood is in the left lateral ventricle. PMHx: DM, HTN, Paroxysmal A Fib, Turner syndrome, legal blindness.    PT Comments    Pt progressing steadily towards her physical therapy goals. Still requiring two person moderate assistance for gait and transfers but demonstrates improved activity tolerance with increased distance today. Continues with decreased attention, cognition, awareness, poor dynamic balance, and coordination deficits. Continue to recommend comprehensive inpatient rehab (CIR) for post-acute therapy needs.    Follow Up Recommendations  CIR;Supervision/Assistance - 24 hour     Equipment Recommendations  Rolling walker with 5" wheels    Recommendations for Other Services       Precautions / Restrictions Precautions Precautions: Fall Restrictions Weight Bearing Restrictions: No    Mobility  Bed Mobility Overal bed mobility: Needs Assistance Bed Mobility: Supine to Sit;Sit to Supine     Supine to sit: Mod assist Sit to supine: Mod assist   General bed mobility comments: ModA for supine <> sit for trunk control  Transfers Overall transfer level: Needs assistance Equipment used: Rolling walker (2 wheeled);None Transfers: Sit to/from Stand Sit to Stand: Mod assist;+2 safety/equipment         General transfer comment: ModA +2 for standing from edge of bed. Use of step stool for scooting hips due to pt short stature and leg length  Ambulation/Gait Ambulation/Gait assistance: Mod assist;+2 safety/equipment Gait Distance (Feet): 250 Feet Assistive device: 2 person hand held assist Gait Pattern/deviations: Step-through pattern;Decreased stride length;Wide base of support Gait  velocity: decreased Gait velocity interpretation: <1.8 ft/sec, indicate of risk for recurrent falls General Gait Details: Pt with posterior/right lean, requiring modA + 2 for dynamic gait. Heavy reliance through handheld support.    Stairs             Wheelchair Mobility    Modified Rankin (Stroke Patients Only)       Balance Overall balance assessment: Needs assistance Sitting-balance support: No upper extremity supported;Feet supported Sitting balance-Leahy Scale: Fair Sitting balance - Comments: Min guard assist for combing hair on edge of bed   Standing balance support: Bilateral upper extremity supported Standing balance-Leahy Scale: Poor                              Cognition Arousal/Alertness: Awake/alert Behavior During Therapy: Flat affect Overall Cognitive Status: Impaired/Different from baseline Area of Impairment: Following commands;Attention;Memory;Problem solving                   Current Attention Level: Focused Memory: Decreased short-term memory Following Commands: Follows one step commands inconsistently;Follows one step commands with increased time     Problem Solving: Slow processing;Decreased initiation;Requires verbal cues;Requires tactile cues General Comments: Pt easily distracted but able to be redirected towards task. Pt asking to check if her husband was under the bed      Exercises      General Comments        Pertinent Vitals/Pain Pain Assessment: Faces Faces Pain Scale: Hurts a little bit Pain Location: back sitting EOB Pain Intervention(s): Repositioned    Home Living                      Prior Function  PT Goals (current goals can now be found in the care plan section) Acute Rehab PT Goals Patient Stated Goal: "drink water." Potential to Achieve Goals: Good Progress towards PT goals: Progressing toward goals    Frequency    Min 3X/week      PT Plan Current plan remains  appropriate    Co-evaluation PT/OT/SLP Co-Evaluation/Treatment: Yes Reason for Co-Treatment: Necessary to address cognition/behavior during functional activity;For patient/therapist safety PT goals addressed during session: Mobility/safety with mobility;Balance        AM-PAC PT "6 Clicks" Mobility   Outcome Measure  Help needed turning from your back to your side while in a flat bed without using bedrails?: A Little Help needed moving from lying on your back to sitting on the side of a flat bed without using bedrails?: A Lot Help needed moving to and from a bed to a chair (including a wheelchair)?: A Lot Help needed standing up from a chair using your arms (e.g., wheelchair or bedside chair)?: A Lot Help needed to walk in hospital room?: A Lot Help needed climbing 3-5 steps with a railing? : Total 6 Click Score: 12    End of Session Equipment Utilized During Treatment: Gait belt;Oxygen Activity Tolerance: Patient tolerated treatment well Patient left: in bed;with call bell/phone within reach;with bed alarm set;with restraints reapplied Nurse Communication: Mobility status PT Visit Diagnosis: Other symptoms and signs involving the nervous system (R29.898);Other abnormalities of gait and mobility (R26.89)     Time: 1443-1540 PT Time Calculation (min) (ACUTE ONLY): 31 min  Charges:  $Therapeutic Activity: 8-22 mins                     Tina [name], PT, DPT Acute Rehabilitation Services Pager [phone] Office [phone]   Willy Eddy 10/25/2018, 12:57 PM

## 2018-10-25 NOTE — Progress Notes (Signed)
Initial Nutrition Assessment  RD working remotely.  DOCUMENTATION CODES:   Obesity unspecified  INTERVENTION:   - Add Magic Cup TID (each provides 290 kcal, 9 g protein)  - Add MVI with minerals  NUTRITION DIAGNOSIS:   Inadequate oral intake related to dysphagia, other (see comment)(restricted diet) as evidenced by estimated needs.  GOAL:   Patient will meet greater than or equal to 90% of their needs   MONITOR:   PO intake, Supplement acceptance, Weight trends, Labs, Diet advancement  REASON FOR ASSESSMENT:   LOS, Other (Comment)(poor PO)    ASSESSMENT:   53 yo female, admitted with stroke. PMH significant for DM type 1, diverticulosis, HLD, HTN, legal blindness, tubular adenoma of colon.  Labs: sodium 146 (H), potassium 3.1 (L), glucose 120 mg/dL (H), A1c 6.6% (10/22/18)  Meds: Pepcid, Novolog, Lantus, Lopressor, Crestor, Senokot-S, 0.45% NaCl at 30 mL/hr  Pt in IR at time of assessment. Per chart review, pt has had frequent diet changes and has eaten very little during admission. Will order Magic Cup TID and monitor adequacy of intake.  NUTRITION - FOCUSED PHYSICAL EXAM: Deferred- RD working remotely.  Diet Order:  No known food allergies 4/3-4/5 NPO 4/6 Dys1 4/7 Dys3 4/8 NPO 4/9 Dys2/nectar Diet Order            DIET DYS 2 Room service appropriate? No; Fluid consistency: Nectar Thick  Diet effective now            PO Intake 5% x 1 meal recorded on 4/6  EDUCATION NEEDS:  No education needs have been identified at this time  Skin:  Skin Assessment: Reviewed RN Assessment  Last BM:  4/8, type 6  Height:  Ht Readings from Last 1 Encounters:  10/18/18 4\' 9"  (1.448 m)    Weight:  Wt Readings from Last 10 Encounters:  10/18/18 65.7 kg  08/06/18 68.5 kg  06/19/18 68.9 kg  06/11/18 67.8 kg  2.8 kg wt loss x 2 months = ~4%, not significant  Net +5.5 L fluid since admit  Ideal Body Weight:  42 kg  BMI:  Body mass index is 31.34 kg/m. obese  class 1  Estimated Nutritional Needs:   Kcal:  1425-1650 (22-25 kcal/kg)  Protein:  79-99 gm (1.2-1.5 g/kg)  Fluid:  >/= 1.5 L or per MD  Althea Grimmer, MS, RDN, LDN Pager: [phone] Available Mondays and Fridays, 9am-2pm

## 2018-10-26 DIAGNOSIS — E876 Hypokalemia: Secondary | ICD-10-CM

## 2018-10-26 DIAGNOSIS — R739 Hyperglycemia, unspecified: Secondary | ICD-10-CM

## 2018-10-26 LAB — GLUCOSE, CAPILLARY
Glucose-Capillary: 177 mg/dL — ABNORMAL HIGH (ref 70–99)
Glucose-Capillary: 94 mg/dL (ref 70–99)
Glucose-Capillary: 104 mg/dL — ABNORMAL HIGH (ref 70–99)
Glucose-Capillary: 184 mg/dL — ABNORMAL HIGH (ref 70–99)
Glucose-Capillary: 309 mg/dL — ABNORMAL HIGH (ref 70–99)
Glucose-Capillary: 209 mg/dL — ABNORMAL HIGH (ref 70–99)

## 2018-10-26 LAB — CBC
HCT: 35 % — ABNORMAL LOW (ref 36.0–46.0)
Hemoglobin: 11.6 g/dL — ABNORMAL LOW (ref 12.0–15.0)
MCH: 30.5 pg (ref 26.0–34.0)
MCHC: 33.1 g/dL (ref 30.0–36.0)
MCV: 92.1 fL (ref 80.0–100.0)
Platelets: 135 10*3/uL — ABNORMAL LOW (ref 150–400)
RBC: 3.8 MIL/uL — ABNORMAL LOW (ref 3.87–5.11)
RDW: 13.4 % (ref 11.5–15.5)
WBC: 10.2 10*3/uL (ref 4.0–10.5)
nRBC: 0 % (ref 0.0–0.2)

## 2018-10-26 LAB — BASIC METABOLIC PANEL
Anion gap: 12 (ref 5–15)
BUN: 11 mg/dL (ref 6–20)
CO2: 23 mmol/L (ref 22–32)
Calcium: 9 mg/dL (ref 8.9–10.3)
Chloride: 110 mmol/L (ref 98–111)
Creatinine, Ser: 1.26 mg/dL — ABNORMAL HIGH (ref 0.44–1.00)
GFR calc Af Amer: 57 mL/min — ABNORMAL LOW (ref >60)
GFR calc non Af Amer: 49 mL/min — ABNORMAL LOW (ref >60)
Glucose, Bld: 138 mg/dL — ABNORMAL HIGH (ref 70–99)
Potassium: 2.8 mmol/L — ABNORMAL LOW (ref 3.5–5.1)
Sodium: 145 mmol/L (ref 135–145)

## 2018-10-26 MED ORDER — POTASSIUM CHLORIDE 10 MEQ/100ML IV SOLN
10.0000 meq | INTRAVENOUS | Status: AC
  Administered 2018-10-26 (×6): 10 meq via INTRAVENOUS
  Filled 2018-10-26 (×7): qty 100

## 2018-10-26 MED ORDER — INSULIN GLARGINE 100 UNIT/ML ~~LOC~~ SOLN
10.0000 [IU] | Freq: Two times a day (BID) | SUBCUTANEOUS | Status: DC
  Administered 2018-10-26: 10 [IU] via SUBCUTANEOUS
  Filled 2018-10-26 (×6): qty 0.1

## 2018-10-26 MED ORDER — SODIUM CHLORIDE 0.9 % IV SOLN
INTRAVENOUS | Status: DC
  Administered 2018-10-26 – 2018-10-28 (×2): via INTRAVENOUS

## 2018-10-26 NOTE — Progress Notes (Signed)
SLP Cancellation Note  Patient Details Name: Tina [name] MRN: [id number] DOB: April 18, 1966   Cancelled treatment:       Reason Eval/Treat Not Completed: Patient's level of consciousness;Fatigue/lethargy limiting ability to participate. Pt with poor alertness in mornings, per RN. Currently with snoring respirations. MBS has been recommended by SLP however not yet performed due to combativeness, level of alertness. SLP to follow up this afternoon pending improved alertness; RN may also page SLP if pt awake and appropriate.  Deneise Lever, [place], CCC-SLP Speech-Language Pathologist Acute Rehabilitation Services Pager: [phone] Office: [phone]    Aliene Altes 10/26/2018, 10:43 AM

## 2018-10-26 NOTE — Progress Notes (Signed)
STROKE TEAM PROGRESS NOTE   SUBJECTIVE (INTERVAL HISTORY) Pt sitting in bed, lethargic, difficulty answer orientation questions, seems confused.  Still has significant sleep apnea pattern of breathing, off bilateral hand restraint this time, discussed with Dr. Lamonte Sakai, will try CPAP for treatment.  OBJECTIVE Vitals:   10/26/18 1100 10/26/18 1115 10/26/18 1130 10/26/18 1145  BP: (!) 172/82 (!) 152/130 (!) 162/69 139/71  Pulse: 81 86 86 85  Resp: (!) 41 (!) 26 17 17   Temp:      TempSrc:      SpO2: 97% 95% 90% 94%  Weight:      Height:        CBC:  Recent Labs  Lab 10/25/18 0736 10/26/18 0505  WBC 8.5 10.2  HGB 11.4* 11.6*  HCT 35.5* 35.0*  MCV 91.3 92.1  PLT 140* 135*    Basic Metabolic Panel:  Recent Labs  Lab 10/23/18 0303  10/24/18 0603 10/25/18 0736 10/26/18 0505  NA  --    < > 151* 146* 145  K  --    < > 3.4* 3.1* 2.8*  CL  --    < > 116* 111 110  CO2  --    < > 27 26 23   GLUCOSE  --    < > 174* 120* 138*  BUN  --    < > 10 7 11   CREATININE  --    < > 1.11* 0.97 1.26*  CALCIUM  --    < > 8.6* 8.5* 9.0  MG 2.3  --  1.8  --   --   PHOS 2.8  --  3.0  --   --    < > = values in this interval not displayed.    Lipid Panel:     Component Value Date/Time   CHOL 131 10/22/2018 0550   TRIG 119 10/22/2018 0550   HDL 42 10/22/2018 0550   CHOLHDL 3.1 10/22/2018 0550   VLDL 24 10/22/2018 0550   LDLCALC 65 10/22/2018 0550   HgbA1c:  Lab Results  Component Value Date   HGBA1C 6.6 (H) 10/22/2018   Urine Drug Screen:     Component Value Date/Time   LABOPIA NONE DETECTED 10/21/2018 1640   COCAINSCRNUR NONE DETECTED 10/21/2018 1640   LABBENZ POSITIVE (A) 10/21/2018 1640   AMPHETMU NONE DETECTED 10/21/2018 1640   THCU NONE DETECTED 10/21/2018 1640   LABBARB NONE DETECTED 10/21/2018 1640    Alcohol Level     Component Value Date/Time   ETH <10 10/18/2018 1050    IMAGING Ct Head Wo Contrast 10/18/2018 IMPRESSION:  Right thalamic hemorrhage with  intraventricular extension. Most of the blood is located in the left lateral ventricle.   Mr Brain Wo Contrast 10/18/2018 IMPRESSION:  Severely motion degraded examination showing unchanged size of intraparenchymal hematoma centered in the right thalamus with intraventricular extension.   CT Angiogram Head  10/19/2018 IMPRESSION: 1. Size stable right thalamic and intraventricular clot with dilatation of the left temporal horn. 2. Tiny spot sign associated with the right thalamic hematoma, of questionable significance given 24 hour stability of intracranial hemorrhage. No underlying vascular malformation or aneurysm. 3. Small remote left thalamic and left cerebellar infarcts.  Ct Head Wo Contrast 10/20/2018 1. Unchanged size of intraparenchymal hematoma centered in the right thalamus. 2. Unchanged volume of intraventricular blood predominantly in the left lateral ventricle. 3. No new site of hemorrhage.   Ct Head Wo Contrast 10/22/2018 04:09  Unchanged right thalamic and intraventricular clot which dilates the temporal  horn of the left lateral ventricle.   Ct Head Wo Contrast 10/22/2018 CLINICAL DATA:  Follow-up intracranial hemorrhage EXAM: CT HEAD WITHOUT CONTRAST TECHNIQUE: Contiguous axial images were obtained from the base of the skull through the vertex without intravenous contrast. COMPARISON:  Two days ago FINDINGS: Brain: Unchanged hematoma in the right thalamus measuring up to 19 mm anterior-posterior. Intraventricular hemorrhage in the left more than right lateral ventricles with temporal horn dilatation and mild periventricular low-density on the left. Remote small vessel infarct in the left cerebellum and left thalamus. No evident infarct or shift. Vascular: No hyperdense vessel or unexpected calcification. Skull: No acute or aggressive finding Sinuses/Orbits: Left cataract resection and right ocular prosthesis. IMPRESSION: Unchanged right thalamic and intraventricular clot which dilates  the temporal horn of the left lateral ventricle. Electronically Signed   By: Monte Fantasia M.D.   On: 10/22/2018 04:09    Transthoracic Echocardiogram  06/12/2018 Study Conclusions - Left ventricle: The cavity size was normal. Systolic function was   normal. The estimated ejection fraction was in the range of 60%   to 65%. Wall motion was normal; there were no regional wall   motion abnormalities. Abnormal relaxation with increased filling   pressures. - Aortic valve: There was no regurgitation. - Aortic root: The aortic root was normal in size. - Mitral valve: There was trivial regurgitation. - Right ventricle: Systolic function was normal. - Atrial septum: No defect or patent foramen ovale was identified. - Tricuspid valve: There was no significant regurgitation. - Inferior vena cava: The vessel was normal in size. The   respirophasic diameter changes were in the normal range (>= 50%),   consistent with normal central venous pressure. - Pericardium, extracardiac: A trivial pericardial effusion was   identified. Impressions: - Normal LV EF, no significant valvular abnormalities.  EKG - SR rate 92 BPM. (See cardiology reading for complete details)  EEG  10/25/2018 IMPRESSION: This is an abnormal EEG secondary to posterior background slowing.  This finding may be seen with a diffuse gray matter disturbance that is etiologically nonspecific, but may include a dementia, among other possibilities.  No epileptiform activity is noted.    PHYSICAL EXAM  General - obese, well developed, lethargic.  Ophthalmologic - fundi not visualized due to noncooperation.  Cardiovascular - Regular rhythm, not in A. Fib.  Neuro - Patient is lethargic, but eyes open and calm without agitation. She is able to speak intangible words but paucity of speech, not following commands to answer orientation questions.  Snoring sound during breathing, right ocular prosthesis, however, seems to have right  hemianopia of left eye, no significant facial asymmetry, tongue midline in mouth.  Moving all extremities symmetrical now.  DTR not corporative, Sensation not cooperative, coordination no ataxia bilaterally by observation but slow action bilateral upper extremities and gait not tested.   ASSESSMENT/PLAN Ms. Doranne Valenti-Cohen is a 53 y.o. female with history of type I diabetes, hypertension, paroxysmal A. fib on Coumadin, Turner syndrome and legal blindness presenting with AMS, mildly hypertensive and combative. She did not receive IV t-PA due to hemorrhage.  Right thalamic hemorrhage with intraventricular extension, likely hypertensive in the setting of Coumadin coagulopathy s/p reversal  CT head - Right thalamic hemorrhage with left IVH.   MRI head - unchanged ICH centered in the right thalamus with intraventricular extension.   CTA Head - Size stable right thalamic and intraventricular clot with dilatation of the left temporal horn. Small remote left thalamic and left cerebellar infarcts.  No aneurysm,  or AVM  Repeat CT 4/7 unchanged R thalamic and IVH w/ temporal horm dilation  CT repeat 4/9 slight interval decrease R thalamic hmg. Slight decrease caliber of ventricles.   2D Echo - 06/12/2018 - EF 60 - 65%.   EEG - posterior background slowing. No seizure   LDL 65   HgbA1c - 6.6  UDS - positive for benzos   VTE prophylaxis - Heparin subcu  warfarin daily prior to admission, INR 2.6, reversed with vitamin K and Kcentra.  Now on No antithrombotic given hmg  NSG consulted, no intervention needed at this time  Therapy recommendations:  CIR, RW 5" wheels  Disposition:  Pending  DKA with type 1 diabetes  HgbA1c 5.7, at goal < 7.0  Switch from insulin IV to Lantus  D2 nectar thick   However due to mental status and CPAP use, patient not able to have regular p.o. intake  Decreased Lantus to 10 bid   SSI 0-9  CBG monitoring  PAF  Currently in NSR  On Coumadin at  home  INR 2.6 on admission, therapeutic -> reversed with Kcentra and vitamin K  INR now 1.1  Rate controlled  Hold off antithrombotic for now due to [place]  AKI with hypernatremia  AKI - creatinine -1.7->2.11->1.54->1.30->1.11->0.97->1.26  Na -154-155-154-159->147->151-146 - 145  On NS @50   Hypertension  Stable   on Cleviprex . SBP goal < 180 mmHg  . Wean off Cleviprex as able . Home meds:  cardizem 240, cozaar 50 bid . Currently on cardizem 240, cozaar 50 bid and metoprolol 50 bid but limited p.o. due to CPAP . Long-term BP goal normotensive   Hyperlipidemia  Lipid lowering medication PTA: Crestor 20 mg daily  LDL 65  Current lipid lowering medication: Crestor 20  Resume statin at discharge  OSA, apnea, desaturations  As per husband, patient had OSA evaluation 1 year ago, no need CPAP at home  Intermittent desaturation during sleep on this admission  On CPAP trial today, so far tolerating okay  CCM on board  Dysphagia  Secondary to stroke  SLP following  MBS cleared for diet  On D2 nectar thick diet  Intermittent p.o. intake due to CPAP and mental status changes  Other Stroke Risk Factors  Former cigarette smoker - quit 30 years ago  ETOH use, advised to drink no more than 1 alcoholic beverage per day.  Obesity, Body mass index is 31.34 kg/m., recommend weight loss, diet and exercise as appropriate   Previous strokes by imaging - Small remote left thalamic and left cerebellar infarcts.  Other Active Problems  INR - 2.6 on admission - reversed -> 1.1  Anemia of chronic disease- 12.1->10.8->11.1->11.4->11.6  Leukocytosis, resolved - 21.7->20->10.2 (afebrile)->7.3->8.5->10.2  Hypokalemia - 2.8 - supplement - recheck in AM  Agitation/encephalopathy, improving - off precedex - avoid benzos/opiates  Hospital day # 8  This patient is critically ill due to [place], IVH, DKA, PAF, dysphagia, OSA and at significant risk of neurological worsening,  death form recurrent bleeding, hydrocephalus, heart failure, DKA, seizure. This patient's care requires constant monitoring of vital signs, hemodynamics, respiratory and cardiac monitoring, review of multiple databases, neurological assessment, discussion with family, other specialists and medical decision making of high complexity. I had long discussion with husband [name] over the phone, updated pt current condition, treatment plan and potential prognosis. He expressed understanding and appreciation. I spent 40 minutes of neurocritical care time in the care of this patient. I had long discussion with husband [name] over the phone,  updated pt current condition, treatment plan and potential prognosis. He expressed understanding and appreciation. I answered all his questions to his satisfaction. I also discussed with Dr. Lamonte Sakai at the bedside.  [name], MD PhD Stroke Neurology 10/26/2018 6:57 PM   To contact Stroke Continuity provider, please refer to [email or web address] After hours, contact General Neurology

## 2018-10-26 NOTE — Progress Notes (Signed)
SLP Cancellation Note  Patient Details Name: Tina [name] MRN: [id number] DOB: 1966-05-04   Cancelled treatment:       Reason Eval/Treat Not Completed: Medical issues which prohibited therapy. Pt placed on cpap per RT. Will follow up next date.  Deneise Lever, [place], CCC-SLP Speech-Language Pathologist Acute Rehabilitation Services Pager: [phone] Office: [phone]    Aliene Altes 10/26/2018, 2:34 PM

## 2018-10-26 NOTE — Progress Notes (Signed)
RT note: RT placed patient on auto titration cpap per MD order and RN request. Patient tolerating well at this time.

## 2018-10-27 DIAGNOSIS — G4733 Obstructive sleep apnea (adult) (pediatric): Secondary | ICD-10-CM

## 2018-10-27 DIAGNOSIS — R410 Disorientation, unspecified: Secondary | ICD-10-CM

## 2018-10-27 LAB — BASIC METABOLIC PANEL
Anion gap: 11 (ref 5–15)
BUN: 9 mg/dL (ref 6–20)
CO2: 23 mmol/L (ref 22–32)
Calcium: 8.5 mg/dL — ABNORMAL LOW (ref 8.9–10.3)
Chloride: 112 mmol/L — ABNORMAL HIGH (ref 98–111)
Creatinine, Ser: 1.01 mg/dL — ABNORMAL HIGH (ref 0.44–1.00)
GFR calc Af Amer: >60 mL/min (ref >60)
GFR calc non Af Amer: >60 mL/min (ref >60)
Glucose, Bld: 159 mg/dL — ABNORMAL HIGH (ref 70–99)
Potassium: 3.6 mmol/L (ref 3.5–5.1)
Sodium: 146 mmol/L — ABNORMAL HIGH (ref 135–145)

## 2018-10-27 LAB — CBC
HCT: 34.6 % — ABNORMAL LOW (ref 36.0–46.0)
Hemoglobin: 11.4 g/dL — ABNORMAL LOW (ref 12.0–15.0)
MCH: 29.9 pg (ref 26.0–34.0)
MCHC: 32.9 g/dL (ref 30.0–36.0)
MCV: 90.8 fL (ref 80.0–100.0)
Platelets: 162 10*3/uL (ref 150–400)
RBC: 3.81 MIL/uL — ABNORMAL LOW (ref 3.87–5.11)
RDW: 13.5 % (ref 11.5–15.5)
WBC: 11.9 10*3/uL — ABNORMAL HIGH (ref 4.0–10.5)
nRBC: 0 % (ref 0.0–0.2)

## 2018-10-27 LAB — GLUCOSE, CAPILLARY
Glucose-Capillary: 113 mg/dL — ABNORMAL HIGH (ref 70–99)
Glucose-Capillary: 91 mg/dL (ref 70–99)
Glucose-Capillary: 131 mg/dL — ABNORMAL HIGH (ref 70–99)
Glucose-Capillary: 170 mg/dL — ABNORMAL HIGH (ref 70–99)
Glucose-Capillary: 137 mg/dL — ABNORMAL HIGH (ref 70–99)
Glucose-Capillary: 117 mg/dL — ABNORMAL HIGH (ref 70–99)

## 2018-10-27 NOTE — Progress Notes (Signed)
no wearing CPAP tonight due to pt being back on restrains.

## 2018-10-27 NOTE — Progress Notes (Addendum)
STROKE TEAM PROGRESS NOTE   SUBJECTIVE (INTERVAL HISTORY) Pt sitting in bed, mildly lethargic, eyes open, disorientated, lack of much language output.  Received CPAP for OSA yesterday for about a 5 to 6 hours, and then become agitated, put on restrain.  Due to mental status fluctuation, still kept n.p.o., on Cleviprex for BP control.  OBJECTIVE Vitals:   10/27/18 1115 10/27/18 1130 10/27/18 1145 10/27/18 1200  BP: (!) 148/66 (!) 161/79 (!) 142/70 (!) 144/76  Pulse: (!) 53 (!) 103 75 73  Resp: (!) 27 (!) 27 (!) 24 (!) 24  Temp:      TempSrc:      SpO2: 95% 99% 96% 98%  Weight:      Height:        CBC:  Recent Labs  Lab 10/25/18 0736 10/26/18 0505  WBC 8.5 10.2  HGB 11.4* 11.6*  HCT 35.5* 35.0*  MCV 91.3 92.1  PLT 140* 135*    Basic Metabolic Panel:  Recent Labs  Lab 10/23/18 0303  10/24/18 0603 10/25/18 0736 10/26/18 0505  NA  --    < > 151* 146* 145  K  --    < > 3.4* 3.1* 2.8*  CL  --    < > 116* 111 110  CO2  --    < > 27 26 23   GLUCOSE  --    < > 174* 120* 138*  BUN  --    < > 10 7 11   CREATININE  --    < > 1.11* 0.97 1.26*  CALCIUM  --    < > 8.6* 8.5* 9.0  MG 2.3  --  1.8  --   --   PHOS 2.8  --  3.0  --   --    < > = values in this interval not displayed.    Lipid Panel:     Component Value Date/Time   CHOL 131 10/22/2018 0550   TRIG 119 10/22/2018 0550   HDL 42 10/22/2018 0550   CHOLHDL 3.1 10/22/2018 0550   VLDL 24 10/22/2018 0550   LDLCALC 65 10/22/2018 0550   HgbA1c:  Lab Results  Component Value Date   HGBA1C 6.6 (H) 10/22/2018   Urine Drug Screen:     Component Value Date/Time   LABOPIA NONE DETECTED 10/21/2018 1640   COCAINSCRNUR NONE DETECTED 10/21/2018 1640   LABBENZ POSITIVE (A) 10/21/2018 1640   AMPHETMU NONE DETECTED 10/21/2018 1640   THCU NONE DETECTED 10/21/2018 1640   LABBARB NONE DETECTED 10/21/2018 1640    Alcohol Level     Component Value Date/Time   ETH <10 10/18/2018 1050    IMAGING Ct Head Wo  Contrast 10/18/2018 IMPRESSION:  Right thalamic hemorrhage with intraventricular extension. Most of the blood is located in the left lateral ventricle.   Mr Brain Wo Contrast 10/18/2018 IMPRESSION:  Severely motion degraded examination showing unchanged size of intraparenchymal hematoma centered in the right thalamus with intraventricular extension.   CT Angiogram Head  10/19/2018 IMPRESSION: 1. Size stable right thalamic and intraventricular clot with dilatation of the left temporal horn. 2. Tiny spot sign associated with the right thalamic hematoma, of questionable significance given 24 hour stability of intracranial hemorrhage. No underlying vascular malformation or aneurysm. 3. Small remote left thalamic and left cerebellar infarcts.  Ct Head Wo Contrast 10/20/2018 1. Unchanged size of intraparenchymal hematoma centered in the right thalamus. 2. Unchanged volume of intraventricular blood predominantly in the left lateral ventricle. 3. No new site of hemorrhage.  Ct Head Wo Contrast 10/22/2018 04:09  Unchanged right thalamic and intraventricular clot which dilates the temporal horn of the left lateral ventricle.   Ct Head Wo Contrast 10/22/2018 CLINICAL DATA:  Follow-up intracranial hemorrhage EXAM: CT HEAD WITHOUT CONTRAST TECHNIQUE: Contiguous axial images were obtained from the base of the skull through the vertex without intravenous contrast. COMPARISON:  Two days ago FINDINGS: Brain: Unchanged hematoma in the right thalamus measuring up to 19 mm anterior-posterior. Intraventricular hemorrhage in the left more than right lateral ventricles with temporal horn dilatation and mild periventricular low-density on the left. Remote small vessel infarct in the left cerebellum and left thalamus. No evident infarct or shift. Vascular: No hyperdense vessel or unexpected calcification. Skull: No acute or aggressive finding Sinuses/Orbits: Left cataract resection and right ocular prosthesis. IMPRESSION:  Unchanged right thalamic and intraventricular clot which dilates the temporal horn of the left lateral ventricle. Electronically Signed   By: Monte Fantasia M.D.   On: 10/22/2018 04:09    Transthoracic Echocardiogram  06/12/2018 Study Conclusions - Left ventricle: The cavity size was normal. Systolic function was   normal. The estimated ejection fraction was in the range of 60%   to 65%. Wall motion was normal; there were no regional wall   motion abnormalities. Abnormal relaxation with increased filling   pressures. - Aortic valve: There was no regurgitation. - Aortic root: The aortic root was normal in size. - Mitral valve: There was trivial regurgitation. - Right ventricle: Systolic function was normal. - Atrial septum: No defect or patent foramen ovale was identified. - Tricuspid valve: There was no significant regurgitation. - Inferior vena cava: The vessel was normal in size. The   respirophasic diameter changes were in the normal range (>= 50%),   consistent with normal central venous pressure. - Pericardium, extracardiac: A trivial pericardial effusion was   identified. Impressions: - Normal LV EF, no significant valvular abnormalities.  EKG - SR rate 92 BPM. (See cardiology reading for complete details)  EEG  10/25/2018 IMPRESSION: This is an abnormal EEG secondary to posterior background slowing.  This finding may be seen with a diffuse gray matter disturbance that is etiologically nonspecific, but may include a dementia, among other possibilities.  No epileptiform activity is noted.    PHYSICAL EXAM  General - obese, well developed, mildly lethargic.  Ophthalmologic - fundi not visualized due to noncooperation.  Cardiovascular - Regular rhythm, not in A. Fib.  Neuro - Patient is mildly lethargic, eyes open and calm without agitation. She is able to speak intangible words but paucity of speech, not following commands to answer orientation questions.  Snoring sound  improving, right ocular prosthesis, however, seems to have right hemianopia of left eye, no significant facial asymmetry, tongue midline in mouth.  Moving all extremities symmetrical now.  DTR not corporative, Sensation not cooperative, coordination no ataxia bilaterally by observation but slow action bilateral upper extremities and gait not tested.   ASSESSMENT/PLAN Tina [name] is a 53 y.o. female with history of type I diabetes, hypertension, paroxysmal A. fib on Coumadin, Turner syndrome and legal blindness presenting with AMS, mildly hypertensive and combative. She did not receive IV t-PA due to hemorrhage.  Right thalamic hemorrhage with intraventricular extension, likely hypertensive in the setting of Coumadin coagulopathy s/p reversal  CT head - Right thalamic hemorrhage with left IVH.   MRI head - unchanged ICH centered in the right thalamus with intraventricular extension.   CTA Head - Size stable right thalamic and intraventricular clot with  dilatation of the left temporal horn. Small remote left thalamic and left cerebellar infarcts.  No aneurysm, or AVM  Repeat CT 4/7 unchanged R thalamic and IVH w/ temporal horm dilation  CT repeat 4/9 slight interval decrease R thalamic hmg. Slight decrease caliber of ventricles.   2D Echo - 06/12/2018 - EF 60 - 65%.   EEG - posterior background slowing. No seizure   LDL 65   HgbA1c - 6.6  UDS - positive for benzos   VTE prophylaxis - Heparin subcu  warfarin daily prior to admission, INR 2.6, reversed with vitamin K and Kcentra.  Now on No antithrombotic given hmg  NSG consulted, no intervention needed at this time  Therapy recommendations:  CIR, RW 5" wheels  Disposition:  Pending  DKA with type 1 diabetes  HgbA1c 5.7, at goal < 7.0  Switch from insulin IV to Lantus  NPO now due to mental status fluctuation  Decreased Lantus to 10 bid   SSI 0-9  CBG monitoring  PAF  Currently in NSR  On Coumadin at  home  INR 2.6 on admission, therapeutic -> reversed with Kcentra and vitamin K  INR now 1.1  Rate controlled  Hold off antithrombotic for now due to [place]  AKI with hypernatremia  AKI - creatinine -1.7->2.11->1.54->1.30->1.11->0.97->1.26->1.01  Na -154-155-154-159->147->151-146 - 145-146  On NS @50   Hypertension  Stable   on Cleviprex . SBP goal < 180 mmHg  . Home meds:  cardizem 240, cozaar 50 bid . was on cardizem 240, cozaar 50 bid and metoprolol 50 bid but now NPO . Long-term BP goal normotensive   Hyperlipidemia  Lipid lowering medication PTA: Crestor 20 mg daily  LDL 65  Current lipid lowering medication: Crestor 20  Resume statin at discharge  OSA, apnea, desaturations  As per husband, patient had OSA evaluation 1 year ago, no need CPAP at home  Intermittent desaturation during sleep on this admission  On CPAP 5-6 hr 10/26/18, but later off due to agitation and restrain  CCM on board  Dysphagia  Secondary to stroke  SLP following  Was on D2 nectar thick diet  Now NPO due to mental status fluctuations  Other Stroke Risk Factors  Former cigarette smoker - quit 30 years ago  ETOH use, advised to drink no more than 1 alcoholic beverage per day.  Obesity, Body mass index is 31.34 kg/m., recommend weight loss, diet and exercise as appropriate   Previous strokes by imaging - Small remote left thalamic and left cerebellar infarcts.  Other Active Problems  INR - 2.6 on admission - reversed -> 1.1  Anemia of chronic disease- 12.1->10.8->11.1->11.4->11.6  Leukocytosis, resolved - 21.7->20->10.2 (afebrile)->7.3->8.5->10.2->11.9  Hypokalemia - 2.8 - supplement -> 3.6  Agitation/encephalopathy, improving - off precedex - avoid benzos/opiates   Hospital day # 9  This patient is critically ill due to [place], IVH, DKA, PAF, dysphagia, OSA and at significant risk of neurological worsening, death form recurrent bleeding, hydrocephalus, heart failure,  DKA, seizure. This patient's care requires constant monitoring of vital signs, hemodynamics, respiratory and cardiac monitoring, review of multiple databases, neurological assessment, discussion with family, other specialists and medical decision making of high complexity. I had long discussion with husband [name] over the phone, updated pt current condition, treatment plan and potential prognosis. He expressed understanding and appreciation. I spent 40 minutes of neurocritical care time in the care of this patient.    [name], MD PhD Stroke Neurology 10/27/2018 5:16 PM   To contact Stroke Continuity provider,  please refer to [email or web address] After hours, contact General Neurology

## 2018-10-27 NOTE — Progress Notes (Signed)
Pt not able to wear CPAP due to pt having restrains on.

## 2018-10-27 NOTE — Progress Notes (Signed)
Inpatient Diabetes Program Recommendations  AACE/ADA: New Consensus Statement on Inpatient Glycemic Control (2015)  Target Ranges:  Prepandial:   less than 140 mg/dL      Peak postprandial:   less than 180 mg/dL (1-2 hours)      Critically ill patients:  140 - 180 mg/dL   Lab Results  Component Value Date   GLUCAP 91 10/27/2018   HGBA1C 6.6 (H) 10/22/2018    Review of Glycemic Control  Diabetes history:Type 1 DM Outpatient Diabetes medications:insulin pump Current orders for Inpatient glycemic control:  Lantus 10 units BID Novolog 0-9 units Q4 hours  Inpatient Diabetes Program Recommendations:    Glucose trends increasing throughout the day, unsure if this is related to PO intake or not as PO intake is not charted. If patient is consuming at least 50% of meals consider Novolog 4 units tid meal coverage in addition to the correction scale.  Thanks,  Tama Headings RN, MSN, BC-ADM Inpatient Diabetes Coordinator Team Pager [phone] (8a-5p)

## 2018-10-27 NOTE — Progress Notes (Signed)
NAME:  Tina [name], MRN:  [id number], DOB:  1966/06/06, LOS: 9 ADMISSION DATE:  10/18/2018, CONSULTATION DATE:  10/20/2018 REFERRING MD:  Dr Erlinda Hong, CHIEF COMPLAINT:  DKA   Brief History   53 year old obese female with hx of Turner's syndrome, A. fib on Coumadin, OSA not on CPAP at home and type 1 DM. Admitted 10/18/2018 for Intracranial bleed . Course complicated by agitation and compounded by nPO status, developed DKA -treated.   Past Medical History   has a past medical history of Arthritis, Cataract, Diabetic retinopathy ([place]), Diverticulosis, DM (diabetes mellitus), type 1 ([place]), Dyspnea, Elevated LFTs, Hyperlipidemia, Hypertension, Internal hemorrhoids, Legally blind in right eye, as defined in [place], Osteoporosis (11/2017), Retinal detachment, Tachycardia, Tubular adenoma of colon, Turner's syndrome, and Visual loss, bilateral.  Significant Hospital Events   10/18/2018= admit 4/5 - DKA ccm consult  Consults:  4/5 - ccm consult  Procedures:    Significant Diagnostic Tests:  Chest x-ray on 10/20/2018-basal atelectasis  Antimicrobials:    Interim history/subjective:  She tolerated auto titration CPAP during the day on 4/11 while sleeping.  It was removed when she had to be placed back in soft wrist restraints due to delirium   Objective   Blood pressure (!) 156/70, pulse (!) 101, temperature (!) 101.1 F (38.4 C), temperature source Axillary, resp. rate (!) 25, height 4\' 9"  (1.448 m), weight 65.7 kg, SpO2 96 %.        Intake/Output Summary (Last 24 hours) at 10/27/2018 1036 Last data filed at 10/27/2018 0600 Gross per 24 hour  Intake 1552.65 ml  Output 1350 ml  Net 202.65 ml   Filed Weights   10/18/18 1104 10/18/18 1700  Weight: 68.5 kg 65.7 kg   Examination:  Gen: No distress, hypersomnolent, Turner's body habitus and faces HEENT: Oropharynx clear, no oral lesions.  Not currently snoring Lungs: Overall clear, no wheeze, decreased at both bases  CV: Regular, no murmur  Abd: Soft, obese, nondistended with positive bowel sounds Ext: No edema Neuro: Eyes open, she will interact but becomes quickly somnolent again. Poorly oriented, cannot answer questions appropriately.  No obvious hallucinations  Resolved Hospital Problem list   DKA   Assessment & Plan:  Intracerebral hemorrhage On prophylactic dose heparin, Coumadin stopped Any further imaging as per neurology plans  Type 1 diabetes Continue sliding scale insulin as ordered.  Hopefully enteral intake will be possible soon Lantus 10 units twice daily Appreciate diabetes coordinator assistance Ultimately will need to go back on her insulin pump  Encephalopathy, multifactorial but appears principally due to [place] now that metabolic disarray has been addressed Agitated delirium Avoid sedating medications as able Note Haldol ordered, could be a contributor to oversedation  History of mild obstructive sleep apnea -No study on record showing obstructive sleep apnea Based on clinical observation would continue auto titration CPAP as she can tolerate.  One barrier has been her delirium and the need for wrist restraints.  She cannot wear CPAP while restrained  Hypertension HLD Continues to intermittently require Cleviprex On diltiazem, losartan, metoprolol Continue rosuvastatin  Acute kidney injury Hypernatremia On NS 50 cc/h as per neurology orders Follow BMP, urine output  Hypokalemia Replace electrolytes as indicated   Dysphagia: He has not been able to fully participate with SLP evaluation due to lethargy, diet per their orders  Best practice:  Diet: dysphagia 2 Pain/Anxiety/Delirium protocol (if indicated):NA VAP protocol (if indicated): n/a DVT prophylaxis: SCD GI prophylaxis: pepcid Glucose control: DM coordinator following. Mobility: Bedrest Code Status:  Full code Family Communication: discussed with pt.  Disposition: ICU  Labs   CBC: Recent Labs  Lab 10/22/18 0550 10/23/18  0911 10/23/18 1823 10/24/18 0603 10/25/18 0736 10/26/18 0505  WBC 10.2 7.3  --  7.3 8.5 10.2  HGB 12.1 10.8* 10.5* 11.1* 11.4* 11.6*  HCT 38.4 35.3* 31.0* 33.7* 35.5* 35.0*  MCV 95.8 95.1  --  93.4 91.3 92.1  PLT 195 142*  --  131* 140* 135*    Basic Metabolic Panel: Recent Labs  Lab 10/21/18 0528 10/22/18 0550 10/23/18 0303 10/23/18 0911 10/23/18 1823 10/24/18 0603 10/25/18 0736 10/26/18 0505  NA 154* 159*  --  147* 152* 151* 146* 145  K 4.2 4.3  --  3.5 3.3* 3.4* 3.1* 2.8*  CL 123* 128*  --  116*  --  116* 111 110  CO2 16* 27  --  23  --  27 26 23   GLUCOSE 266* 335*  --  356*  --  174* 120* 138*  BUN 48* 35*  --  20  --  10 7 11   CREATININE 2.11* 1.54*  --  1.30*  --  1.11* 0.97 1.26*  CALCIUM 9.3 9.2  --  8.6*  --  8.6* 8.5* 9.0  MG 2.4 2.4 2.3  --   --  1.8  --   --   PHOS 3.9 3.4 2.8  --   --  3.0  --   --    GFR: Estimated Creatinine Clearance: 40.7 mL/min (A) (by C-G formula based on SCr of 1.26 mg/dL (H)). Recent Labs  Lab 10/20/18 2235  10/23/18 0911 10/24/18 0603 10/25/18 0736 10/26/18 0505  WBC  --    < > 7.3 7.3 8.5 10.2  LATICACIDVEN 1.4  --   --   --   --   --    < > = values in this interval not displayed.    Liver Function Tests: Recent Labs  Lab 10/21/18 0528  AST 61*  ALT 70*  ALKPHOS 99  BILITOT 1.5*  PROT 6.1*  ALBUMIN 3.1*   No results for input(s): LIPASE, AMYLASE in the last 168 hours. No results for input(s): AMMONIA in the last 168 hours.  ABG    Component Value Date/Time   PHART 7.428 10/23/2018 1823   PCO2ART 38.5 10/23/2018 1823   PO2ART 78.0 (L) 10/23/2018 1823   HCO3 25.4 10/23/2018 1823   TCO2 27 10/23/2018 1823   ACIDBASEDEF 2.0 10/20/2018 1154   O2SAT 96.0 10/23/2018 1823     Coagulation Profile: Recent Labs  Lab 10/21/18 0528  INR 1.1    Cardiac Enzymes: No results for input(s): CKTOTAL, CKMB, CKMBINDEX, TROPONINI in the last 168 hours.  HbA1C: Hgb A1c MFr Bld  Date/Time Value Ref Range Status   10/22/2018 05:50 AM 6.6 (H) 4.8 - 5.6 % Final    Comment:    (NOTE) Pre diabetes:          5.7%-6.4% Diabetes:              >6.4% Glycemic control for   <7.0% adults with diabetes   06/13/2018 05:59 AM 5.7 (H) 4.8 - 5.6 % Final    Comment:    (NOTE) Pre diabetes:          5.7%-6.4% Diabetes:              >6.4% Glycemic control for   <7.0% adults with diabetes     CBG: Recent Labs  Lab 10/26/18 1512 10/26/18 1921 10/26/18 2328  10/27/18 0335 10/27/18 [place]     Baltazar Apo, MD, PhD 10/27/2018, 10:36 AM [facility] [phone] or if no answer [phone]

## 2018-10-27 NOTE — Progress Notes (Signed)
SLP Cancellation Note  Patient Details Name: Tina [name] MRN: [id number] DOB: 05-21-1966   Cancelled treatment:       Reason Eval/Treat Not Completed: Medical issues which prohibited therapy. Spoke with RN, has been keeping pt NPO due to mentation; will hold therapy today. SLP to follow up next date for reassessment.  Deneise Lever, [place], CCC-SLP Speech-Language Pathologist Acute Rehabilitation Services Pager: [phone] Office: [phone]    Aliene Altes 10/27/2018, 3:58 PM

## 2018-10-28 DIAGNOSIS — E0810 Diabetes mellitus due to underlying condition with ketoacidosis without coma: Secondary | ICD-10-CM

## 2018-10-28 DIAGNOSIS — I169 Hypertensive crisis, unspecified: Secondary | ICD-10-CM

## 2018-10-28 LAB — CBC
HCT: 34.6 % — ABNORMAL LOW (ref 36.0–46.0)
Hemoglobin: 11.1 g/dL — ABNORMAL LOW (ref 12.0–15.0)
MCH: 29.3 pg (ref 26.0–34.0)
MCHC: 32.1 g/dL (ref 30.0–36.0)
MCV: 91.3 fL (ref 80.0–100.0)
Platelets: 156 10*3/uL (ref 150–400)
RBC: 3.79 MIL/uL — ABNORMAL LOW (ref 3.87–5.11)
RDW: 13.6 % (ref 11.5–15.5)
WBC: 10.5 10*3/uL (ref 4.0–10.5)
nRBC: 0 % (ref 0.0–0.2)

## 2018-10-28 LAB — BASIC METABOLIC PANEL
Anion gap: 10 (ref 5–15)
BUN: 7 mg/dL (ref 6–20)
CO2: 24 mmol/L (ref 22–32)
Calcium: 8.3 mg/dL — ABNORMAL LOW (ref 8.9–10.3)
Chloride: 112 mmol/L — ABNORMAL HIGH (ref 98–111)
Creatinine, Ser: 0.9 mg/dL (ref 0.44–1.00)
GFR calc Af Amer: >60 mL/min (ref >60)
GFR calc non Af Amer: >60 mL/min (ref >60)
Glucose, Bld: 147 mg/dL — ABNORMAL HIGH (ref 70–99)
Potassium: 3.2 mmol/L — ABNORMAL LOW (ref 3.5–5.1)
Sodium: 146 mmol/L — ABNORMAL HIGH (ref 135–145)

## 2018-10-28 LAB — GLUCOSE, CAPILLARY
Glucose-Capillary: 127 mg/dL — ABNORMAL HIGH (ref 70–99)
Glucose-Capillary: 178 mg/dL — ABNORMAL HIGH (ref 70–99)
Glucose-Capillary: 169 mg/dL — ABNORMAL HIGH (ref 70–99)
Glucose-Capillary: 177 mg/dL — ABNORMAL HIGH (ref 70–99)
Glucose-Capillary: 283 mg/dL — ABNORMAL HIGH (ref 70–99)
Glucose-Capillary: 257 mg/dL — ABNORMAL HIGH (ref 70–99)

## 2018-10-28 LAB — MAGNESIUM: Magnesium: 1.9 mg/dL (ref 1.7–2.4)

## 2018-10-28 MED ORDER — METOPROLOL TARTRATE 5 MG/5ML IV SOLN
10.0000 mg | Freq: Once | INTRAVENOUS | Status: AC
  Administered 2018-10-29: 10 mg via INTRAVENOUS
  Filled 2018-10-28: qty 10

## 2018-10-28 MED ORDER — INSULIN GLARGINE 100 UNIT/ML ~~LOC~~ SOLN
5.0000 [IU] | Freq: Two times a day (BID) | SUBCUTANEOUS | Status: DC
  Administered 2018-10-28: 10:00:00 5 [IU] via SUBCUTANEOUS
  Filled 2018-10-28 (×7): qty 0.05

## 2018-10-28 MED ORDER — ADULT MULTIVITAMIN W/MINERALS CH
1.0000 | ORAL_TABLET | Freq: Every day | ORAL | Status: DC
  Administered 2018-10-28 – 2018-11-11 (×9): 1 via ORAL
  Filled 2018-10-28 (×14): qty 1

## 2018-10-28 MED ORDER — POTASSIUM CHLORIDE 20 MEQ PO PACK
40.0000 meq | PACK | Freq: Once | ORAL | Status: AC
  Administered 2018-10-28: 14:00:00 40 meq via ORAL
  Filled 2018-10-28: qty 2

## 2018-10-28 MED ORDER — LABETALOL HCL 5 MG/ML IV SOLN
20.0000 mg | INTRAVENOUS | Status: DC | PRN
  Administered 2018-10-29 – 2018-10-31 (×4): 20 mg via INTRAVENOUS
  Filled 2018-10-28 (×4): qty 4

## 2018-10-28 NOTE — Progress Notes (Signed)
Inpatient Diabetes Program Recommendations  AACE/ADA: New Consensus Statement on Inpatient Glycemic Control   Target Ranges:  Prepandial:   less than 140 mg/dL      Peak postprandial:   less than 180 mg/dL (1-2 hours)      Critically ill patients:  140 - 180 mg/dL   Results for Tina [name], Tina [name] (MRN [id number]) as of 10/28/2018 09:08  Ref. Range 10/27/2018 08:07 10/27/2018 11:38 10/27/2018 15:50 10/27/2018 19:18 10/27/2018 23:04 10/28/2018 03:18 10/28/2018 08:10  Glucose-Capillary Latest Ref Range: 70 - 99 mg/dL 91 131 (H)  Novolog 1 unit 170 (H)  Novolog 2 units 137 (H)  Novolog 1 unit 117 (H) 127 (H)  Novolog 1 unit 178 (H)   Review of Glycemic Control  Diabetes history: DM1(makes NO insulin; requires basal, correction, and carbohydrate coverage insulin) Outpatient Diabetes medications:Medtronic insulin pump Current orders for Inpatient glycemic control:Novolog 0-9 units Q4H, Lantus10units BID  Inpatient Diabetes Program Recommendations:   Insulin - Basal: In reviewing the chart, noted patient did NOT receive Lantus at all on 10/27/18. Last dose of Lantus 10 units was given on 10/26/18 at 21:19. Please consider decreasing Lantus to 5 units BID. NURSING, please administer Lantus as prescribed unless MD provides order to hold. Patient has Type 1 DM and makes NO insulin at all.  NOTE: Per Deboraha Sprang, RN, Diabetes Coordinator note on 10/21/18, patient was receiving a total of 33.3 units per day from insulin pump for basal needs.  Thanks, Barnie Alderman, RN, MSN, CDE Diabetes Coordinator Inpatient Diabetes Program [phone] (Team Pager from 8am to 5pm)

## 2018-10-28 NOTE — Progress Notes (Signed)
NAME:  Tina [name], MRN:  [id number], DOB:  May 17, 1966, LOS: 52 ADMISSION DATE:  10/18/2018, CONSULTATION DATE:  10/20/2018 REFERRING MD:  Dr Erlinda Hong, CHIEF COMPLAINT:  DKA   Brief History   53 year old obese female with hx of Turner's syndrome, A. fib on Coumadin, OSA not on CPAP at home and type 1 DM. Admitted 10/18/2018 for Intracranial bleed . Course complicated by agitation and compounded by nPO status, developed DKA -treated.   Past Medical History   has a past medical history of Arthritis, Cataract, Diabetic retinopathy ([place]), Diverticulosis, DM (diabetes mellitus), type 1 ([place]), Dyspnea, Elevated LFTs, Hyperlipidemia, Hypertension, Internal hemorrhoids, Legally blind in right eye, as defined in [place], Osteoporosis (11/2017), Retinal detachment, Tachycardia, Tubular adenoma of colon, Turner's syndrome, and Visual loss, bilateral.  Significant Hospital Events   10/18/2018= admit 4/5 - DKA ccm consult 4/8  Ongoing agitation 4/10  Continues to have intermittent confusion, waxing and waning wakefulness Haldol given with some improvement in delirium  4/11 started on CPAP 4/12 She tolerated auto titration CPAP during the day on 4/11 while sleeping.  It was removed when she had to be placed back in soft wrist restraints due to delirium 4/13 much more awake, still confused  Consults:  4/5 - ccm consult  Procedures:    Significant Diagnostic Tests:  Chest x-ray on 10/20/2018-basal atelectasis   4/3 MRSA PCR >> neg 4/3 BC x 3 >> coag neg 1/2  Antimicrobials:  none  Interim history/subjective:  Did not wear CPAP overnight as she still required restraints Intermittently on cleviprex gtt for SBP goal < 180, did restart oral home meds this am More much awake today, ongoing confusion.  She is currently mildly agitated in that she has to void and wont use her purwick  Objective   Blood pressure (!) 166/67, pulse 92, temperature 98.9 F (37.2 C), temperature source Axillary, resp. rate (!)  23, height 4\' 9"  (1.448 m), weight 65.7 kg, SpO2 98 %.        Intake/Output Summary (Last 24 hours) at 10/28/2018 0948 Last data filed at 10/28/2018 0600 Gross per 24 hour  Intake 1300.65 ml  Output 850 ml  Net 450.65 ml   Filed Weights   10/18/18 1104 10/18/18 1700  Weight: 68.5 kg 65.7 kg   Examination:  General:  Adult female sitting in bed, mildly agitated- keeps saying she has to pee HEENT: MM pink/moist, right prothestic eye, left pupil 4/reactive Neuro:  Awake, f/c, MAE, oriented to self only CV: rr, no mumur PULM: even/non-labored, lungs bilaterally clear ZY:SAYT, non-tender, bs active  Extremities: warm/dry, no edema  Skin: no rashes  Resolved Hospital Problem list   DKA  Assessment & Plan:  Intracerebral hemorrhage- right thalamic hemorrhage, likely hypertensive in the setting of of coumadin s/p reversal Per stroke team  Any further imaging as per neurology plans Cleviprex prn for SBP goal < 180 Anticipating CIR when able PT/ OT following  Type 1 diabetes Continue SSI sensitive Decrease lantus to 5 units BID Appreciate diabetes coordinator assistance Ultimately will need to go back on her insulin pump  Encephalopathy, multifactorial but appears principally due to [place]  Agitated delirium Avoid sedating medications as able Has not needed haldol x 2 days- prn only   History of mild obstructive sleep apnea continue auto titration CPAP as she can tolerate.  One barrier has been her delirium and the need for wrist restraints.  She cannot wear CPAP while restrained She can follow up with our pulmonary office  as outpatient patient for further follow-up and sleep study as needed  Hypertension HLD Continues to intermittently require Cleviprex for SBP goal < 180 Was able to take her diltiazem, losartan, metoprolol today  Continue rosuvastatin  Acute kidney injury Mild hyperchloremic acidosis Hypernatremia Stop NS 50 cc/h given mild hyperchloremic acidosis,  starting to take PO's Follow BMP, urine output  Hypokalemia Replace electrolytes as indicated KCL 40 meq x 1 Add on mag level, goal > 2   Dysphagia: Dysphagia 2 diet  Global:  Her DKA has resolved and mentals status slowly improving.  Has resumed her oral dysphagia diet.  Nothing further to add.  PCCM will sign off.  Please do not hesitate to call us back if we can be of any further assistance.   Best practice:  Diet: dysphagia 2 Pain/Anxiety/Delirium protocol (if indicated):NA VAP protocol (if indicated): n/a DVT prophylaxis: SCD and heparin SQ GI prophylaxis: pepcid Glucose control: DM coordinator following. Mobility: Bedrest Code Status: Full code Family Communication:  Disposition: ICU  Labs   CBC: Recent Labs  Lab 10/24/18 0603 10/25/18 0736 10/26/18 0505 10/27/18 1223 10/28/18 0314  WBC 7.3 8.5 10.2 11.9* 10.5  HGB 11.1* 11.4* 11.6* 11.4* 11.1*  HCT 33.7* 35.5* 35.0* 34.6* 34.6*  MCV 93.4 91.3 92.1 90.8 91.3  PLT 131* 140* 135* 162 409    Basic Metabolic Panel: Recent Labs  Lab 10/22/18 0550 10/23/18 0303  10/24/18 0603 10/25/18 0736 10/26/18 0505 10/27/18 1223 10/28/18 0314  NA 159*  --    < > 151* 146* 145 146* 146*  K 4.3  --    < > 3.4* 3.1* 2.8* 3.6 3.2*  CL 128*  --    < > 116* 111 110 112* 112*  CO2 27  --    < > 27 26 23 23 24   GLUCOSE 335*  --    < > 174* 120* 138* 159* 147*  BUN 35*  --    < > 10 7 11 9 7   CREATININE 1.54*  --    < > 1.11* 0.97 1.26* 1.01* 0.90  CALCIUM 9.2  --    < > 8.6* 8.5* 9.0 8.5* 8.3*  MG 2.4 2.3  --  1.8  --   --   --   --   PHOS 3.4 2.8  --  3.0  --   --   --   --    < > = values in this interval not displayed.   GFR: Estimated Creatinine Clearance: 57 mL/min (by C-G formula based on SCr of 0.9 mg/dL). Recent Labs  Lab 10/25/18 0736 10/26/18 0505 10/27/18 1223 10/28/18 0314  WBC 8.5 10.2 11.9* 10.5    Liver Function Tests: No results for input(s): AST, ALT, ALKPHOS, BILITOT, PROT, ALBUMIN in the last  168 hours. No results for input(s): LIPASE, AMYLASE in the last 168 hours. No results for input(s): AMMONIA in the last 168 hours.  ABG    Component Value Date/Time   PHART 7.428 10/23/2018 1823   PCO2ART 38.5 10/23/2018 1823   PO2ART 78.0 (L) 10/23/2018 1823   HCO3 25.4 10/23/2018 1823   TCO2 27 10/23/2018 1823   ACIDBASEDEF 2.0 10/20/2018 1154   O2SAT 96.0 10/23/2018 1823     Coagulation Profile: No results for input(s): INR, PROTIME in the last 168 hours.  Cardiac Enzymes: No results for input(s): CKTOTAL, CKMB, CKMBINDEX, TROPONINI in the last 168 hours.  HbA1C: Hgb A1c MFr Bld  Date/Time Value Ref Range Status  10/22/2018 05:50  AM 6.6 (H) 4.8 - 5.6 % Final    Comment:    (NOTE) Pre diabetes:          5.7%-6.4% Diabetes:              >6.4% Glycemic control for   <7.0% adults with diabetes   06/13/2018 05:59 AM 5.7 (H) 4.8 - 5.6 % Final    Comment:    (NOTE) Pre diabetes:          5.7%-6.4% Diabetes:              >6.4% Glycemic control for   <7.0% adults with diabetes     CBG: Recent Labs  Lab 10/27/18 1550 10/27/18 1918 10/27/18 2304 10/28/18 0318 10/28/18 0810  GLUCAP 170* [place], MSN, AGACNP-BC [facility] Pgr: [phone] or if no answer [phone] 10/28/2018, 10:17 AM

## 2018-10-28 NOTE — Progress Notes (Signed)
Patient with BP of 159/139 (has reduced pulse pressure) in the context of missed PO doses of her metoprolol and ARB, due to drowsiness. Was admitted for management of a right thalamic hypertensive hemorrhage with intraventricular extension.   Administering metoprolol IV 10 mg x 1 now. Recheck BP in 1 hour and if still elevated, will administer additional 10 mg IV metoprolol followed by another recheck. If still elevated will treat with IV hydralazine.   Discussed with pharmacy.   Electronically signed: Dr. Kerney Elbe

## 2018-10-28 NOTE — Progress Notes (Signed)
Inpatient Rehabilitation-Admissions Coordinator   Met with pt at the bedside this afternoon. Pt very lethargic and minimally verbal. At this time, I don't think pt would be able to tolerate CIR. AC will continue to follow for progress with therapies to help determine most appropriate post acute rehab venue once medically ready.   AC to follow.   Jhonnie Garner, OTR/L  Rehab Admissions Coordinator  [phone] 10/28/2018 3:41 PM

## 2018-10-28 NOTE — Progress Notes (Signed)
RT Note:  Patient is currently in restraints and is not responding to any of my questions.  It appears the patient is sedated, so currently patient is not able to pull of mask if she develops nausea and vomiting, will not place CPAP at this time.

## 2018-10-28 NOTE — Progress Notes (Signed)
Physical Therapy Treatment Patient Details Name: Tina [name] MRN: [id number] DOB: September 17, 1965 Today's Date: 10/28/2018    History of Present Illness 53 year old female admitted 10/18/2018 with AMS.  Head CT = right thalamic hemorrhage with intraventricular extension. Most of the blood is in the left lateral ventricle. PMHx: DM, HTN, Paroxysmal A Fib, Turner syndrome, legal blindness.    PT Comments    Patient seen for activity progression. Tolerated session well with goal of OOB to use bathroom, then assisted patient with multiple ambulatory trials. Patient able to make steady progress from 2 person moderate assist to 1 person assist with increased facilitation for posture and stability. Continues to be impulsive, but remained engaged throughout session. Current POC remains appropriate.   Follow Up Recommendations  CIR;Supervision/Assistance - 24 hour     Equipment Recommendations  Rolling walker with 5" wheels    Recommendations for Other Services Rehab consult     Precautions / Restrictions Precautions Precautions: Fall    Mobility  Bed Mobility Overal bed mobility: Needs Assistance Bed Mobility: Supine to Sit     Supine to sit: Mod assist     General bed mobility comments: Moderate assist to come to EOb with increased time and effort noted  Transfers Overall transfer level: Needs assistance Equipment used: 1 person hand held assist Transfers: Sit to/from Stand;Stand Pivot Transfers Sit to Stand: Mod assist Stand pivot transfers: Mod assist       General transfer comment: Moderate assist for safety and stability, patient with noted impulsivity during transitional movements  Ambulation/Gait Ambulation/Gait assistance: Mod assist;+2 safety/equipment Gait Distance (Feet): 260 Feet Assistive device: 2 person hand held assist Gait Pattern/deviations: Step-through pattern;Decreased stride length;Wide base of support Gait velocity: decreased Gait velocity  interpretation: <1.8 ft/sec, indicate of risk for recurrent falls General Gait Details: Patient with noted instability during ambulation, VCs for increased cadence and upright posture/ positioning. Able to release one of the HHA with increased distance but continues to show heavy reliance on external support   Stairs             Wheelchair Mobility    Modified Rankin (Stroke Patients Only) Modified Rankin (Stroke Patients Only) Pre-Morbid Rankin Score: No symptoms Modified Rankin: Moderately severe disability     Balance Overall balance assessment: Needs assistance Sitting-balance support: No upper extremity supported;Feet supported Sitting balance-Leahy Scale: Fair Sitting balance - Comments: Min guard assist for combing hair on edge of bed Postural control: Posterior lean Standing balance support: Bilateral upper extremity supported Standing balance-Leahy Scale: Poor Standing balance comment: relaince on external support                            Cognition Arousal/Alertness: Awake/alert Behavior During Therapy: Flat affect;Impulsive Overall Cognitive Status: Impaired/Different from baseline Area of Impairment: Following commands;Attention;Memory;Problem solving                   Current Attention Level: Focused Memory: Decreased short-term memory Following Commands: Follows one step commands inconsistently;Follows one step commands with increased time     Problem Solving: Slow processing;Decreased initiation;Requires verbal cues;Requires tactile cues General Comments: Max cues for attention to task performance and redirection for safety      Exercises      General Comments        Pertinent Vitals/Pain Pain Assessment: Faces Faces Pain Scale: No hurt    Home Living  Prior Function            PT Goals (current goals can now be found in the care plan section) Acute Rehab PT Goals Patient Stated Goal: "  get up" PT Goal Formulation: Patient unable to participate in goal setting Time For Goal Achievement: 11/02/18 Potential to Achieve Goals: Good Progress towards PT goals: Progressing toward goals    Frequency    Min 3X/week      PT Plan Current plan remains appropriate    Co-evaluation              AM-PAC PT "6 Clicks" Mobility   Outcome Measure  Help needed turning from your back to your side while in a flat bed without using bedrails?: A Little Help needed moving from lying on your back to sitting on the side of a flat bed without using bedrails?: A Lot Help needed moving to and from a bed to a chair (including a wheelchair)?: A Lot Help needed standing up from a chair using your arms (e.g., wheelchair or bedside chair)?: A Lot Help needed to walk in hospital room?: A Lot Help needed climbing 3-5 steps with a railing? : Total 6 Click Score: 12    End of Session Equipment Utilized During Treatment: Gait belt;Oxygen Activity Tolerance: Patient tolerated treatment well Patient left: in bed;in chair;with call bell/phone within reach;with chair alarm set;with restraints reapplied Nurse Communication: Mobility status PT Visit Diagnosis: Other symptoms and signs involving the nervous system (R29.898);Other abnormalities of gait and mobility (R26.89)     Time: 6433-2951 PT Time Calculation (min) (ACUTE ONLY): 25 min  Charges:  $Gait Training: 8-22 mins $Therapeutic Activity: 8-22 mins                     Alben Deeds, PT DPT  Board Certified Neurologic Specialist [place] Pager [phone] Office [place] 10/28/2018, 1:09 PM

## 2018-10-28 NOTE — Progress Notes (Signed)
  Speech Language Pathology Treatment: Dysphagia  Patient Details Name: Tina [name] MRN: [id number] DOB: 1966/06/10 Today's Date: 10/28/2018 Time: 0349-6116 SLP Time Calculation (min) (ACUTE ONLY): 13 min  Assessment / Plan / Recommendation Clinical Impression  Pt more alert today, diet was resumed per MD.  She is refusing most POs today - with encouragement, willing to consume limited sips of nectar, but declined all other PO.  No overt s/s of aspiration with limited intake.  Responses to questions were tangential, delayed. Pt required max cues to repeat single words/sentences, but did so accurately with adequate recall and articulation after 30-60 second delay.  Able to state name and identify correct hospital when given choice of two.  Continue dysphagia 2, nectars, meds whole with liquids.  If pt is alert and willing to eat tomorrow, consider MBS as planned.    HPI HPI: 53 year old female admitted 10/18/2018 with AMS. PMH: DM, HTN, Paroxysmal A Fib, Turner syndrome, legal blindness. Head CT = right thalamic hemorrhage with intraventricular extension. Most of the blood is in the left lateral ventricle.      SLP Plan  Continue with current plan of care       Recommendations  Diet recommendations: Dysphagia 2 (fine chop);Nectar-thick liquid Liquids provided via: Cup;Straw Medication Administration: Whole meds with liquid Supervision: Full supervision/cueing for compensatory strategies Compensations: Minimize environmental distractions;Slow rate;Small sips/bites Postural Changes and/or Swallow Maneuvers: Seated upright 90 degrees                Oral Care Recommendations: Oral care BID Follow up Recommendations: Inpatient Rehab SLP Visit Diagnosis: Dysphagia, unspecified (R13.10) Plan: Continue with current plan of care       GO                Juan Quam Laurice 10/28/2018, 3:35 PM  Phelan Schadt L. Tivis Ringer, [place] Office number  [phone] Pager [phone]

## 2018-10-28 NOTE — Progress Notes (Addendum)
STROKE TEAM PROGRESS NOTE   SUBJECTIVE (INTERVAL HISTORY) Patient is awake and lying in bed comfortably.  Follow simple commands.  She has still not passed swallow eval.  She is still on Cardene drip which is been tapered.  Blood sugars have been fluctuating.  Blood pressure adequately controlled  OBJECTIVE Vitals:   10/28/18 0600 10/28/18 0615 10/28/18 0800 10/28/18 0935  BP: (!) 163/77 (!) 164/82  (!) 166/67  Pulse: 88 92    Resp: 20 (!) 23    Temp:   98.9 F (37.2 C)   TempSrc:   Axillary   SpO2: 99% 98%    Weight:      Height:        CBC:  Recent Labs  Lab 10/27/18 1223 10/28/18 0314  WBC 11.9* 10.5  HGB 11.4* 11.1*  HCT 34.6* 34.6*  MCV 90.8 91.3  PLT 162 102    Basic Metabolic Panel:  Recent Labs  Lab 10/23/18 0303  10/24/18 0603  10/27/18 1223 10/28/18 0314  NA  --    < > 151*   < > 146* 146*  K  --    < > 3.4*   < > 3.6 3.2*  CL  --    < > 116*   < > 112* 112*  CO2  --    < > 27   < > 23 24  GLUCOSE  --    < > 174*   < > 159* 147*  BUN  --    < > 10   < > 9 7  CREATININE  --    < > 1.11*   < > 1.01* 0.90  CALCIUM  --    < > 8.6*   < > 8.5* 8.3*  MG 2.3  --  1.8  --   --   --   PHOS 2.8  --  3.0  --   --   --    < > = values in this interval not displayed.    IMAGING past 24h No results found.    PHYSICAL EXAM   General - obese middle-aged Caucasian lady, not in distress Ophthalmologic - fundi not visualized due to noncooperation.  Cardiovascular - Regular rhythm, not in A. Fib.  Neuro - Patient is awake, eyes open and calm without agitation. She is able to speak intangible words but paucity of speech,   following occasional commands to answer orientation questions.  . Follows one-step commands only.  Right ocular prosthesis,.  Normal movements in the left eye.  No significant facial asymmetry, tongue midline in mouth.  Moving all extremities symmetrical now.  DTR not corporative, Sensation not cooperative, coordination no ataxia bilaterally by  observation but slow action bilateral upper extremities and gait not tested.   ASSESSMENT/PLAN Ms. Natally Valenti-Cohen is a 53 y.o. female with history of type I diabetes, hypertension, paroxysmal A. fib on Coumadin, Turner syndrome and legal blindness presenting with AMS, mildly hypertensive and combative. She did not receive IV t-PA due to hemorrhage.  Right thalamic hemorrhage with intraventricular extension, likely hypertensive in the setting of Coumadin coagulopathy s/p reversal  CT head - Right thalamic hemorrhage with left IVH.   MRI head - unchanged ICH centered in the right thalamus with intraventricular extension.   CTA Head - Size stable right thalamic and intraventricular clot with dilatation of the left temporal horn. Small remote left thalamic and left cerebellar infarcts.  No aneurysm, or AVM  Repeat CT 4/7 unchanged R thalamic and  IVH w/ temporal horm dilation  CT repeat 4/9 slight interval decrease R thalamic hmg. Slight decrease caliber of ventricles.   2D Echo - 06/12/2018 - EF 60 - 65%.   EEG - posterior background slowing. No seizure   LDL 65   HgbA1c - 6.6  UDS - positive for benzos   VTE prophylaxis - Heparin subcu  warfarin daily prior to admission, INR 2.6, reversed with vitamin K and Kcentra.  Now on No antithrombotic given hmg  NSG consulted, no intervention needed   Therapy recommendations:  CIR, RW 5" wheels  Disposition:  Pending  Transfer to floor once stable off cardene  DKA with type 1 diabetes  HgbA1c 5.7, at goal < 7.0  Switch from insulin IV to Lantus  Resume diet   Decrease Lantus to 5 bid   SSI 0-9  CBG monitoring  PAF  Currently in NSR  On Coumadin at home  INR 2.6 on admission, therapeutic -> reversed with Kcentra and vitamin K  INR now 1.1  Rate controlled  Hold off antithrombotic for now due to [facility] consider future participation in the ASPIRE trial (aspirin versus Eliquis )   AKI with hypernatremia  AKI  - creatinine -0.90  Na 146  Hypertension  Stable   on Cleviprex . SBP goal < 180 mmHg  . Home meds:  cardizem 240, cozaar 50 bid . Resumed cardizem 240, cozaar 50 bid and metoprolol 50 bid  . Wean cardene . Long-term BP goal normotensive   Hyperlipidemia  Lipid lowering medication PTA: Crestor 20 mg daily  LDL 65  Current lipid lowering medication: Crestor 20  Resume statin at discharge  OSA, apnea, desaturations  As per husband, patient had OSA evaluation 1 year ago, no need CPAP at home  Intermittent desaturation during sleep on this admission  On and off CPAP throughout hospitalization, depending on agitation level  CCM on board  Dysphagia  Secondary to stroke  SLP following  On D2 nectar thick diet  Other Stroke Risk Factors  Former cigarette smoker - quit 30 years ago  ETOH use, advised to drink no more than 1 alcoholic beverage per day.  Obesity, Body mass index is 31.34 kg/m., recommend weight loss, diet and exercise as appropriate   Previous strokes by imaging - Small remote left thalamic and left cerebellar infarcts.  Other Active Problems  INR - 2.6 on admission - reversed -> 1.1  Anemia of chronic disease- 11.1  Leukocytosis, resolved - 10.5  Hypokalemia - 3.2  Agitation/encephalopathy, improving - off precedex - avoid benzos/opiates   Hospital day # 10  I have personally obtained history,examined this patient, reviewed notes, independently viewed imaging studies, participated in medical decision making and plan of care.ROS completed by me personally and pertinent positives fully documented  I have made any additions or clarifications directly to the above note. Starleen Blue out of bed.  Physical occupational therapy consults.  Taper Cardene drip and encourage oral intake and blood pressure medicines.  Add IV labetalol PRN.  Anticipate transfer to rehab in the next few days.  Discussed with patient and nurse This patient is critically ill  and at significant risk of neurological worsening, death and care requires constant monitoring of vital signs, hemodynamics,respiratory and cardiac monitoring, extensive review of multiple databases, frequent neurological assessment, discussion with family, other specialists and medical decision making of high complexity.I have made any additions or clarifications directly to the above note.This critical care time does not reflect procedure time, or teaching time  or supervisory time of PA/NP/Med Resident etc but could involve care discussion time.  I spent 30 minutes of neurocritical care time  in the care of  this patient.      Antony Contras, MD Medical Director [facility] Pager: [phone] 10/28/2018 1:06 PM   To contact Stroke Continuity provider, please refer to [email or web address] After hours, contact General Neurology

## 2018-10-29 LAB — BASIC METABOLIC PANEL
Anion gap: 13 (ref 5–15)
BUN: 10 mg/dL (ref 6–20)
CO2: 24 mmol/L (ref 22–32)
Calcium: 8.7 mg/dL — ABNORMAL LOW (ref 8.9–10.3)
Chloride: 107 mmol/L (ref 98–111)
Creatinine, Ser: 1.28 mg/dL — ABNORMAL HIGH (ref 0.44–1.00)
GFR calc Af Amer: 56 mL/min — ABNORMAL LOW (ref >60)
GFR calc non Af Amer: 48 mL/min — ABNORMAL LOW (ref >60)
Glucose, Bld: 214 mg/dL — ABNORMAL HIGH (ref 70–99)
Potassium: 4.5 mmol/L (ref 3.5–5.1)
Sodium: 144 mmol/L (ref 135–145)

## 2018-10-29 LAB — CBC
HCT: 32.8 % — ABNORMAL LOW (ref 36.0–46.0)
Hemoglobin: 10.3 g/dL — ABNORMAL LOW (ref 12.0–15.0)
MCH: 29 pg (ref 26.0–34.0)
MCHC: 31.4 g/dL (ref 30.0–36.0)
MCV: 92.4 fL (ref 80.0–100.0)
Platelets: 151 10*3/uL (ref 150–400)
RBC: 3.55 MIL/uL — ABNORMAL LOW (ref 3.87–5.11)
RDW: 13.5 % (ref 11.5–15.5)
WBC: 10 10*3/uL (ref 4.0–10.5)
nRBC: 0 % (ref 0.0–0.2)

## 2018-10-29 LAB — GLUCOSE, CAPILLARY
Glucose-Capillary: 183 mg/dL — ABNORMAL HIGH (ref 70–99)
Glucose-Capillary: 191 mg/dL — ABNORMAL HIGH (ref 70–99)
Glucose-Capillary: 211 mg/dL — ABNORMAL HIGH (ref 70–99)
Glucose-Capillary: 213 mg/dL — ABNORMAL HIGH (ref 70–99)
Glucose-Capillary: 216 mg/dL — ABNORMAL HIGH (ref 70–99)
Glucose-Capillary: 225 mg/dL — ABNORMAL HIGH (ref 70–99)

## 2018-10-29 LAB — MAGNESIUM: Magnesium: 1.8 mg/dL (ref 1.7–2.4)

## 2018-10-29 MED ORDER — INSULIN GLARGINE 100 UNIT/ML ~~LOC~~ SOLN
6.0000 [IU] | Freq: Two times a day (BID) | SUBCUTANEOUS | Status: DC
  Administered 2018-10-29 – 2018-11-04 (×12): 6 [IU] via SUBCUTANEOUS
  Filled 2018-10-29 (×17): qty 0.06

## 2018-10-29 NOTE — Progress Notes (Signed)
Physical Therapy Treatment Patient Details Name: Tina [name] MRN: [id number] DOB: 07-Aug-1965 Today's Date: 10/29/2018    History of Present Illness 53 year old female admitted 10/18/2018 with AMS.  Head CT = right thalamic hemorrhage with intraventricular extension. Most of the blood is in the left lateral ventricle. PMHx: DM, HTN, Paroxysmal A Fib, Turner syndrome, legal blindness.    PT Comments    Pt lethargic this session and only wanting to lay back down to go to sleep. Participation improved with transfer to EOB, however pt not willing to attempt standing this session. Was able to perform ADL task while EOB, however not following any other commands and swatting therapist away when attempting to facilitate anterior lean to initiate sit>stand. Will continue to follow.     Follow Up Recommendations  CIR;Supervision/Assistance - 24 hour     Equipment Recommendations  Rolling walker with 5" wheels    Recommendations for Other Services Rehab consult     Precautions / Restrictions Precautions Precautions: Fall Restrictions Weight Bearing Restrictions: No    Mobility  Bed Mobility Overal bed mobility: Needs Assistance Bed Mobility: Supine to Sit;Sit to Supine     Supine to sit: Mod assist;+2 for physical assistance;HOB elevated Sit to supine: Mod assist;+2 for safety/equipment   General bed mobility comments: Heavy assist required to transition to EOB. Bed pad utilized as well. Pt very lethargic and not assisting with movement at beginning of session. Once sitting up, became more alert.   Transfers Overall transfer level: Needs assistance Equipment used: 2 person hand held assist Transfers: Lateral/Scoot Transfers          Lateral/Scoot Transfers: Mod assist;+2 physical assistance General transfer comment: Pt adamantly refusing to attempt standing. Only agreeable to laterally scoot to [facility] to be able to lay back down and go to sleep. Effortful movements to scoot up  to [facility].  Ambulation/Gait                 Stairs             Wheelchair Mobility    Modified Rankin (Stroke Patients Only) Modified Rankin (Stroke Patients Only) Pre-Morbid Rankin Score: No symptoms Modified Rankin: Moderately severe disability     Balance Overall balance assessment: Needs assistance Sitting-balance support: No upper extremity supported;Feet supported Sitting balance-Leahy Scale: Fair Sitting balance - Comments: Min guard assist for combing hair on edge of bed Postural control: Posterior lean                                  Cognition Arousal/Alertness: Lethargic Behavior During Therapy: Flat affect Overall Cognitive Status: Impaired/Different from baseline Area of Impairment: Following commands;Attention;Memory;Problem solving                   Current Attention Level: Focused Memory: Decreased short-term memory Following Commands: Follows one step commands inconsistently;Follows one step commands with increased time     Problem Solving: Slow processing;Decreased initiation;Requires verbal cues;Requires tactile cues General Comments: Max cues for attention to task performance and redirection for safety      Exercises      General Comments General comments (skin integrity, edema, etc.): VSS - on RA throughout session.       Pertinent Vitals/Pain Pain Assessment: Faces Faces Pain Scale: No hurt Pain Intervention(s): Monitored during session    Home Living  Prior Function            PT Goals (current goals can now be found in the care plan section) Acute Rehab PT Goals Patient Stated Goal: Go back to sleep PT Goal Formulation: Patient unable to participate in goal setting Time For Goal Achievement: 11/02/18 Potential to Achieve Goals: Good Progress towards PT goals: Not progressing toward goals - comment    Frequency    Min 3X/week      PT Plan Current plan remains  appropriate    Co-evaluation              AM-PAC PT "6 Clicks" Mobility   Outcome Measure  Help needed turning from your back to your side while in a flat bed without using bedrails?: A Little Help needed moving from lying on your back to sitting on the side of a flat bed without using bedrails?: A Lot Help needed moving to and from a bed to a chair (including a wheelchair)?: A Lot Help needed standing up from a chair using your arms (e.g., wheelchair or bedside chair)?: A Lot Help needed to walk in hospital room?: A Lot Help needed climbing 3-5 steps with a railing? : Total 6 Click Score: 12    End of Session Equipment Utilized During Treatment: Gait belt;Oxygen Activity Tolerance: Patient tolerated treatment well Patient left: in bed;in chair;with call bell/phone within reach;with chair alarm set;with restraints reapplied Nurse Communication: Mobility status PT Visit Diagnosis: Other symptoms and signs involving the nervous system (R29.898);Other abnormalities of gait and mobility (R26.89)     Time: 0370-4888 PT Time Calculation (min) (ACUTE ONLY): 30 min  Charges:  $Therapeutic Activity: 23-37 mins                     [name], PT, DPT Acute Rehabilitation Services Pager: [phone] Office: [phone]    [name] 10/29/2018, 2:56 PM

## 2018-10-29 NOTE — Progress Notes (Signed)
SLP Cancellation Note  Patient Details Name: Tina [name] MRN: [id number] DOB: [birth year]   Cancelled treatment:  Planned for MBS today, but upon entering room and attempting to awaken pt with verbal/tactile effort, pt is poorly arousable and not sufficiently alert for participation. Will continue efforts.         Jameila Keeny L. Tivis Ringer, [place] CCC/SLP Acute Rehabilitation Services Office number [phone] Pager [phone]  Tina [name] 10/29/2018, 11:46 AM

## 2018-10-29 NOTE — Progress Notes (Signed)
Inpatient Diabetes Program Recommendations  AACE/ADA: New Consensus Statement on Inpatient Glycemic Control  Target Ranges:  Prepandial:   less than 140 mg/dL      Peak postprandial:   less than 180 mg/dL (1-2 hours)      Critically ill patients:  140 - 180 mg/dL   Results for Tina [name], Tina [name] (MRN [id number]) as of 10/29/2018 07:41  Ref. Range 10/28/2018 08:10 10/28/2018 12:22 10/28/2018 15:48 10/28/2018 21:40 10/28/2018 23:19 10/29/2018 04:02  Glucose-Capillary Latest Ref Range: 70 - 99 mg/dL 178 (H) 169 (H) 177 (H) 283 (H) 257 (H) 216 (H)   Review of Glycemic Control  Diabetes history: DM1(makes NO insulin; requires basal, correction, and carbohydrate coverage insulin) Outpatient Diabetes medications:Medtronicinsulin pump Current orders for Inpatient glycemic control:Novolog0-9 units Q4H, Lantus5units BID  Inpatient Diabetes Program Recommendations:   Insulin - Basal: Evening dose of Lantus was NOT GIVEN on 10/28/18. Please consider increasing Lantus to 6 units BID.    NOTE:  In reviewing chart, noted patient did NOT receive evening dose of Lantus 5 units last night (charted as "pharmacy did not supply").  As a result glucose in 200's since midnight.  Basal insulin should be given unless MD order to hold.  NURSING: Please administer basal insulin as ordered and notify pharmacy if not available on unit.  Thanks, Barnie Alderman, RN, MSN, CDE Diabetes Coordinator Inpatient Diabetes Program [phone] (Team Pager from 8am to 5pm)

## 2018-10-29 NOTE — Progress Notes (Signed)
STROKE TEAM PROGRESS NOTE   SUBJECTIVE (INTERVAL HISTORY) Patient working with therapist in the room but not following commands and participating a lot with therapy.  She remains aphasic.  She has low-grade fever.  OBJECTIVE Vitals:   10/29/18 0400 10/29/18 0900 10/29/18 0952 10/29/18 1211  BP: (!) 185/88 (!) 156/83 (!) 156/83 (!) 155/67  Pulse: 86   79  Resp: 20   (!) 21  Temp: (!) 100.4 F (38 C) 98.9 F (37.2 C)  98.7 F (37.1 C)  TempSrc: Oral Axillary  Axillary  SpO2: 95%   98%  Weight:      Height:        CBC:  Recent Labs  Lab 10/28/18 0314 10/29/18 0806  WBC 10.5 10.0  HGB 11.1* 10.3*  HCT 34.6* 32.8*  MCV 91.3 92.4  PLT 156 254    Basic Metabolic Panel:  Recent Labs  Lab 10/23/18 0303  10/24/18 0603  10/28/18 0314 10/29/18 0806  NA  --    < > 151*   < > 146* 144  K  --    < > 3.4*   < > 3.2* 4.5  CL  --    < > 116*   < > 112* 107  CO2  --    < > 27   < > 24 24  GLUCOSE  --    < > 174*   < > 147* 214*  BUN  --    < > 10   < > 7 10  CREATININE  --    < > 1.11*   < > 0.90 1.28*  CALCIUM  --    < > 8.6*   < > 8.3* 8.7*  MG 2.3  --  1.8  --  1.9 1.8  PHOS 2.8  --  3.0  --   --   --    < > = values in this interval not displayed.    IMAGING past 24h No results found.    PHYSICAL EXAM    General - obese middle-aged Caucasian lady, not in distress Ophthalmologic - fundi not visualized due to noncooperation.  Cardiovascular - Regular rhythm, not in A. Fib.  Neuro - Patient is awake, eyes open and calm without agitation. She is able to speak intangible words but paucity of speech,   following occasional commands to answer orientation questions.  . Follows one-step commands only.  Right ocular prosthesis,.  Normal movements in the left eye.  No significant facial asymmetry, tongue midline in mouth.  Moving all extremities symmetrical now.  DTR not corporative, Sensation not cooperative, coordination no ataxia bilaterally by observation but slow action  bilateral upper extremities and gait not tested.   ASSESSMENT/PLAN [name] is a 53 y.o. female with history of type I diabetes, hypertension, paroxysmal A. fib on Coumadin, Turner syndrome and legal blindness presenting with AMS, mildly hypertensive and combative. She did not receive IV t-PA due to hemorrhage.  Right thalamic hemorrhage with intraventricular extension, likely hypertensive in the setting of Coumadin coagulopathy s/p reversal  CT head - Right thalamic hemorrhage with left IVH.   MRI head - unchanged ICH centered in the right thalamus with intraventricular extension.   CTA Head - Size stable right thalamic and intraventricular clot with dilatation of the left temporal horn. Small remote left thalamic and left cerebellar infarcts.  No aneurysm, or AVM  Repeat CT 4/7 unchanged R thalamic and IVH w/ temporal horm dilation  CT repeat 4/9 slight interval  decrease R thalamic hmg. Slight decrease caliber of ventricles.   2D Echo - 06/12/2018 - EF 60 - 65%.   EEG - posterior background slowing. No seizure   LDL 65   HgbA1c - 6.6  UDS - positive for benzos   VTE prophylaxis - Heparin subcu  warfarin daily prior to admission, INR 2.6, reversed with vitamin K and Kcentra.  Now on No antithrombotic given hmg  NSG consulted, no intervention needed   Therapy recommendations:  CIR, RW 5" wheels -> possible change to SNF  Disposition:  Pending  DKA with type 1 diabetes  HgbA1c 5.7, at goal < 7.0  On insulin IV in the ICU, now on Lantus  Resume diet   Increased Lantus to 6 bid   SSI 0-9  Missed lantus dose last night w/ elevated glucoses. DB RN addressed  CBG monitoring  PAF  in NSR  On Coumadin at home  INR 2.6 on admission, therapeutic -> reversed with Kcentra and vitamin K  INR now 1.1  Rate controlled  Hold off antithrombotic for now due to [facility] consider future participation in the ASPIRE trial (aspirin versus Eliquis )   AKI with  hypernatremia  AKI - creatinine - 1.28 - monitor  Na 144  Hypertension  Stable   Treated with Cleviprex in ICU, now off . SBP goal < 180 mmHg  . Home meds:  cardizem 240, cozaar 50 bid . Resumed cardizem 240, cozaar 50 bid and metoprolol 50 bid  . Long-term BP goal normotensive  . Monitor BP - given IV prns if elevated d/t pt not taking meds  Hyperlipidemia  Lipid lowering medication PTA: Crestor 20 mg daily  LDL 65  Current lipid lowering medication: Crestor 20  Resume statin at discharge  OSA, apnea, desaturations  As per husband, patient had OSA evaluation 1 year ago, no need CPAP at home  Intermittent desaturation during sleep on this admission  On and off CPAP throughout hospitalization, depending on agitation level  Dysphagia  Secondary to stroke  SLP following  On D2 nectar thick diet  Other Stroke Risk Factors  Former cigarette smoker - quit 30 years ago  ETOH use, advised to drink no more than 1 alcoholic beverage per day.  Obesity, Body mass index is 31.34 kg/m., recommend weight loss, diet and exercise as appropriate   Previous strokes by imaging - Small remote left thalamic and left cerebellar infarcts.  Other Active Problems  INR - 2.6 on admission - reversed -> 1.1  Anemia of chronic disease- 10.3  Leukocytosis, resolved - 10.3  Hypokalemia - 4.5  Agitation/encephalopathy, improving - off precedex - avoid benzos/opiates   Hospital day # 11  Continue ongoing therapy and hopefully patient will increase participation with therapies to qualify to go to rehab over the next few days.  Antony Contras, MD Medical Director [facility] St[facility] Pager: [phone] 10/29/2018 3:01 PM   To contact Stroke Continuity provider, please refer to [email or web address] After hours, contact General Neurology

## 2018-10-29 NOTE — Progress Notes (Signed)
Inpatient Rehabilitation-Admissions Coordinator   Pt remains lethargic and unable to fully participate in therapy today. AC will continue to follow but it appears pt may be more appropriate for SNF placement if her participation remains limited.   Jhonnie Garner, OTR/L  Rehab Admissions Coordinator  [phone] 10/29/2018 2:43 PM

## 2018-10-29 NOTE — Progress Notes (Signed)
Pt very drowsy, unable to take PO meds. MD notified

## 2018-10-30 ENCOUNTER — Telehealth: Payer: Self-pay | Admitting: Cardiovascular Disease

## 2018-10-30 LAB — BASIC METABOLIC PANEL
Anion gap: 11 (ref 5–15)
BUN: 8 mg/dL (ref 6–20)
CO2: 28 mmol/L (ref 22–32)
Calcium: 9 mg/dL (ref 8.9–10.3)
Chloride: 106 mmol/L (ref 98–111)
Creatinine, Ser: 1.14 mg/dL — ABNORMAL HIGH (ref 0.44–1.00)
GFR calc Af Amer: >60 mL/min (ref >60)
GFR calc non Af Amer: 55 mL/min — ABNORMAL LOW (ref >60)
Glucose, Bld: 160 mg/dL — ABNORMAL HIGH (ref 70–99)
Potassium: 4.1 mmol/L (ref 3.5–5.1)
Sodium: 145 mmol/L (ref 135–145)

## 2018-10-30 LAB — CBC
HCT: 33.7 % — ABNORMAL LOW (ref 36.0–46.0)
Hemoglobin: 11 g/dL — ABNORMAL LOW (ref 12.0–15.0)
MCH: 30.4 pg (ref 26.0–34.0)
MCHC: 32.6 g/dL (ref 30.0–36.0)
MCV: 93.1 fL (ref 80.0–100.0)
Platelets: 176 10*3/uL (ref 150–400)
RBC: 3.62 MIL/uL — ABNORMAL LOW (ref 3.87–5.11)
RDW: 13.8 % (ref 11.5–15.5)
WBC: 9.8 10*3/uL (ref 4.0–10.5)
nRBC: 0 % (ref 0.0–0.2)

## 2018-10-30 LAB — GLUCOSE, CAPILLARY
Glucose-Capillary: 148 mg/dL — ABNORMAL HIGH (ref 70–99)
Glucose-Capillary: 162 mg/dL — ABNORMAL HIGH (ref 70–99)
Glucose-Capillary: 322 mg/dL — ABNORMAL HIGH (ref 70–99)
Glucose-Capillary: 247 mg/dL — ABNORMAL HIGH (ref 70–99)

## 2018-10-30 MED ORDER — AMANTADINE HCL 100 MG PO CAPS
100.0000 mg | ORAL_CAPSULE | Freq: Two times a day (BID) | ORAL | Status: DC
  Administered 2018-10-31 – 2018-11-03 (×7): 100 mg via ORAL
  Filled 2018-10-30 (×12): qty 1

## 2018-10-30 NOTE — Progress Notes (Signed)
  Speech Language Pathology Treatment: Dysphagia  Patient Details Name: Tina [name] MRN: [id number] DOB: 1966/06/01 Today's Date: 10/30/2018 Time: 4403-4742 SLP Time Calculation (min) (ACUTE ONLY): 24 min  Assessment / Plan / Recommendation Clinical Impression  Per review of prior ST sessions and today's RN report, pt has had little intake past several days. Sleeping upon arrival but adequately wakened with repositioning in bed, sitting upright and auditory stimuli. Pt's MBS cancelled yesterday due to lethargy. Trials of thin liquid to determine if overt s/s continue to be present. Throughout session she consumed total of approximately 20 oz (water and grape juice) with one delayed cough at end of session. Consecutive and large sips with cup and straw given pt's continued impulsivity. Despite repeated and multiple verbal cues for smaller sips she was incapable of slowing and required tactile intervention. An MBS would assist in determining pharyngeal integrity of swallowing and ability to upgrade. Will attempt MBS tomorrow and if unable to participate, may upgrade to thin and follow clinically.    HPI HPI: 53 year old female admitted 10/18/2018 with AMS. PMH: DM, HTN, Paroxysmal A Fib, Turner syndrome, legal blindness. Head CT = right thalamic hemorrhage with intraventricular extension. Most of the blood is in the left lateral ventricle.      SLP Plan  Continue with current plan of care       Recommendations  Diet recommendations: Nectar-thick liquid;Dysphagia 2 (fine chop) Liquids provided via: Cup;Straw Medication Administration: Whole meds with liquid Supervision: Full supervision/cueing for compensatory strategies Compensations: Minimize environmental distractions;Slow rate;Small sips/bites Postural Changes and/or Swallow Maneuvers: Seated upright 90 degrees                Oral Care Recommendations: Oral care BID Follow up Recommendations: 24 hour supervision/assistance SLP  Visit Diagnosis: Dysphagia, unspecified (R13.10) Plan: Continue with current plan of care       GO                Houston Siren 10/30/2018, 1:40 PM  Orbie Pyo Tanmay Halteman M.Ed Risk analyst [phone] Office [phone]

## 2018-10-30 NOTE — Telephone Encounter (Signed)
Spoke with husband who is upset stating that all he is being told is that his wife is hard to arouse and won't get out of bed.  Pt states she is always like that and unless you really work with her to get her up, she will sleep all day.  Advised husband to contact the hospital and let them know this because we are unable to do anything from the office.  Husband states he will contact the hospital again.

## 2018-10-30 NOTE — Progress Notes (Signed)
Pt is restrained at this time and not able to go on CPAP. RT will continue to monitor as needed.

## 2018-10-30 NOTE — Progress Notes (Signed)
Physical Therapy Treatment Patient Details Name: Tina [name] MRN: [id number] DOB: 1966/04/03 Today's Date: 10/30/2018    History of Present Illness 53 year old female admitted 10/18/2018 with AMS.  Head CT = right thalamic hemorrhage with intraventricular extension. Most of the blood is in the left lateral ventricle. PMHx: DM, HTN, Paroxysmal A Fib, Turner syndrome, legal blindness.    PT Comments    Pt continues to be limited by lethargy and agitation. Swatting at therapists and attempting to push past therapist to lay back down throughout EOB activity. Pt was able to tolerate ~20 minutes sitting EOB and participated with ADL activity (see OT treatment note for details). Sitting balance improved from max assist initially to min guard assist by end of session. Max encouragement provided for sit>stand to change out soiled bed pad. Able to complete after multiple attempts and +2 mod assist. Continue to feel that CIR would be the most beneficial d/c disposition, however participation level has been low. If participation does not improve, may want to consider a higher level of care such as SNF. Will continue to follow.     Follow Up Recommendations  CIR;Supervision/Assistance - 24 hour     Equipment Recommendations  Rolling walker with 5" wheels    Recommendations for Other Services Rehab consult     Precautions / Restrictions Precautions Precautions: Fall Precaution Comments: Wrist restraints Restrictions Weight Bearing Restrictions: No    Mobility  Bed Mobility Overal bed mobility: Needs Assistance Bed Mobility: Supine to Sit;Sit to Supine Rolling: +2 for physical assistance;Max assist   Supine to sit: +2 for physical assistance;HOB elevated;Max assist Sit to supine: Mod assist;+2 for safety/equipment   General bed mobility comments: Heavy assist required to transition to EOB. Bed pad utilized as well. Pt very lethargic and not assisting with movement at beginning of  session. Once sitting up, became more alert.   Transfers Overall transfer level: Needs assistance Equipment used: 2 person hand held assist Transfers: Sit to/from Stand Sit to Stand: Mod assist;+2 physical assistance         General transfer comment: Increased time and max encouragement for pt to agree to stand. Attempted x4 and on ast attempt able to clear hips from bed enough to switch out soiled bed ad with clean/dry bed pad.   Ambulation/Gait             General Gait Details: Adamantly refusing OOB activity. Swatting at therapists and trying to push past therapist to lay back down.    Stairs             Wheelchair Mobility    Modified Rankin (Stroke Patients Only) Modified Rankin (Stroke Patients Only) Pre-Morbid Rankin Score: No symptoms Modified Rankin: Moderately severe disability     Balance Overall balance assessment: Needs assistance Sitting-balance support: No upper extremity supported;Feet supported Sitting balance-Leahy Scale: Fair Sitting balance - Comments: Min guard assist for combing hair on edge of bed Postural control: Posterior lean Standing balance support: Bilateral upper extremity supported Standing balance-Leahy Scale: Poor Standing balance comment: relaince on external support                            Cognition Arousal/Alertness: Lethargic Behavior During Therapy: Flat affect Overall Cognitive Status: Impaired/Different from baseline Area of Impairment: Following commands;Attention;Memory;Problem solving;Awareness                   Current Attention Level: Focused Memory: Decreased short-term memory Following Commands: Follows  one step commands inconsistently;Follows one step commands with increased time   Awareness: Intellectual Problem Solving: Slow processing;Decreased initiation;Requires verbal cues;Requires tactile cues General Comments: Max cues for attention to task performance and redirection for  safety      Exercises      General Comments        Pertinent Vitals/Pain Pain Assessment: Faces Faces Pain Scale: No hurt Pain Intervention(s): Monitored during session    Home Living                      Prior Function            PT Goals (current goals can now be found in the care plan section) Acute Rehab PT Goals Patient Stated Goal: Go back to sleep PT Goal Formulation: Patient unable to participate in goal setting Time For Goal Achievement: 11/02/18 Potential to Achieve Goals: Good Progress towards PT goals: Progressing toward goals    Frequency    Min 4X/week      PT Plan Current plan remains appropriate    Co-evaluation PT/OT/SLP Co-Evaluation/Treatment: Yes Reason for Co-Treatment: Necessary to address cognition/behavior during functional activity;For patient/therapist safety;To address functional/ADL transfers PT goals addressed during session: Mobility/safety with mobility;Balance;Proper use of DME        AM-PAC PT "6 Clicks" Mobility   Outcome Measure  Help needed turning from your back to your side while in a flat bed without using bedrails?: A Little Help needed moving from lying on your back to sitting on the side of a flat bed without using bedrails?: A Lot Help needed moving to and from a bed to a chair (including a wheelchair)?: A Lot Help needed standing up from a chair using your arms (e.g., wheelchair or bedside chair)?: A Lot Help needed to walk in hospital room?: A Lot Help needed climbing 3-5 steps with a railing? : Total 6 Click Score: 12    End of Session   Activity Tolerance: Patient limited by lethargy Patient left: in bed;with call bell/phone within reach;with restraints reapplied Nurse Communication: Mobility status PT Visit Diagnosis: Other symptoms and signs involving the nervous system (R29.898);Other abnormalities of gait and mobility (R26.89)     Time: 3818-2993 PT Time Calculation (min) (ACUTE ONLY): 30  min  Charges:  $Therapeutic Activity: 8-22 mins                     [name], PT, DPT Acute Rehabilitation Services Pager: [phone] Office: [phone]    [name] 10/30/2018, 8:37 AM

## 2018-10-30 NOTE — Progress Notes (Signed)
Occupational Therapy Treatment Patient Details Name: Tina [name] MRN: [id number] DOB: [birth year] Today's Date: 10/30/2018    History of present illness 53 year old female admitted 10/18/2018 with AMS.  Head CT = right thalamic hemorrhage with intraventricular extension. Most of the blood is in the left lateral ventricle. PMHx: DM, HTN, Paroxysmal A Fib, Turner syndrome, legal blindness.   OT comments  Pt engaged in PT/OT cotx.  Asleep in bed upon arrival and requires maximal multimodal cueing to awaken/engage.  Pt lethargic and agitated throughout session, requires max assist to ascend to EOB with +2 assist and resistant to transitioning trunk to sitting EOB.  Once seated engaged in grooming with min-mod assist with min guard to maintain balance.  Continues to present with poor sequencing, initiation and problem solving.  Continue to believe CIR will best benefit patients recovery and return to PLOF, but if participation does not improve may need SNF rehab.  Will follow.    Follow Up Recommendations  CIR;Supervision/Assistance - 24 hour    Equipment Recommendations  Other (comment)(defer to next venue of care)    Recommendations for Other Services      Precautions / Restrictions Precautions Precautions: Fall Precaution Comments: Wrist restraints Restrictions Weight Bearing Restrictions: No       Mobility Bed Mobility Overal bed mobility: Needs Assistance Bed Mobility: Supine to Sit;Sit to Supine Rolling: +2 for physical assistance;Max assist   Supine to sit: +2 for physical assistance;HOB elevated;Max assist Sit to supine: Mod assist;+2 for safety/equipment   General bed mobility comments: heavy max assist to transition to EOB, resistant to sit EOB; once sitting improved alertness and able to transition back to supine assisting with LEs   Transfers Overall transfer level: Needs assistance Equipment used: 2 person hand held assist;Rolling walker (2 wheeled) Transfers:  Sit to/from Stand Sit to Stand: Mod assist;+2 physical assistance         General transfer comment: Increased time and max encouragement for pt to agree to stand. Attempted x4 and on ast attempt able to clear hips from bed enough to switch out soiled bed ad with clean/dry bed pad.     Balance Overall balance assessment: Needs assistance Sitting-balance support: No upper extremity supported;Feet supported Sitting balance-Leahy Scale: Fair Sitting balance - Comments: Min guard assist for engagement in ADLs at EOB Postural control: Posterior lean Standing balance support: Bilateral upper extremity supported;During functional activity Standing balance-Leahy Scale: Poor Standing balance comment: relaince on external support                           ADL either performed or assessed with clinical judgement   ADL Overall ADL's : Needs assistance/impaired     Grooming: Brushing hair;Wash/dry hands;Sitting;Moderate assistance Grooming Details (indicate cue type and reason): max hand over hand cueing for initation of tasks, resistant initally but engaged with support; difficulty sequencing tasks                    Toilet Transfer Details (indicate cue type and reason): deferred due to safety  Toileting- Clothing Manipulation and Hygiene: Total assistance;+2 for physical assistance;+2 for safety/equipment;Sit to/from stand Toileting - Clothing Manipulation Details (indicate cue type and reason): total assist with sit to stand to change pads in bed, incontinent of urine     Functional mobility during ADLs: Maximal assistance;Moderate assistance;+2 for physical assistance;+2 for safety/equipment;Cueing for safety;Cueing for sequencing General ADL Comments: pt limited by lethargy, participation, as well as cognition  Vision       Perception     Praxis      Cognition Arousal/Alertness: Lethargic Behavior During Therapy: Flat affect Overall Cognitive Status:  Impaired/Different from baseline Area of Impairment: Following commands;Attention;Memory;Problem solving;Awareness                   Current Attention Level: Focused Memory: Decreased short-term memory Following Commands: Follows one step commands inconsistently;Follows one step commands with increased time   Awareness: Intellectual Problem Solving: Slow processing;Decreased initiation;Requires verbal cues;Requires tactile cues General Comments: pt with limited participation, requires max cueing to attend to tasks with constant redirection for safety; poor problem sovling         Exercises     Shoulder Instructions       General Comments VSS, on room air throughout session; encouraged sleep/wake cycle with HOB elevated and lights on upon exiting     Pertinent Vitals/ Pain       Pain Assessment: Faces Faces Pain Scale: No hurt Pain Intervention(s): Monitored during session  Home Living                                          Prior Functioning/Environment              Frequency  Min 2X/week        Progress Toward Goals  OT Goals(current goals can now be found in the care plan section)  Progress towards OT goals: Progressing toward goals  Acute Rehab OT Goals Patient Stated Goal: Go back to sleep  Plan Discharge plan remains appropriate;Frequency remains appropriate    Co-evaluation    PT/OT/SLP Co-Evaluation/Treatment: Yes Reason for Co-Treatment: Complexity of the patient's impairments (multi-system involvement);Necessary to address cognition/behavior during functional activity;For patient/therapist safety;To address functional/ADL transfers PT goals addressed during session: Mobility/safety with mobility;Balance;Proper use of DME OT goals addressed during session: ADL's and self-care      AM-PAC OT "6 Clicks" Daily Activity     Outcome Measure   Help from another person eating meals?: A Lot Help from another person taking  care of personal grooming?: A Lot Help from another person toileting, which includes using toliet, bedpan, or urinal?: Total Help from another person bathing (including washing, rinsing, drying)?: A Lot Help from another person to put on and taking off regular upper body clothing?: A Lot Help from another person to put on and taking off regular lower body clothing?: Total 6 Click Score: 10    End of Session    OT Visit Diagnosis: Unsteadiness on feet (R26.81);Other abnormalities of gait and mobility (R26.89);Muscle weakness (generalized) (M62.81);Other symptoms and signs involving cognitive function   Activity Tolerance Patient limited by lethargy   Patient Left in bed;with call bell/phone within reach;with bed alarm set;with restraints reapplied   Nurse Communication Mobility status        Time: 1007-1219 OT Time Calculation (min): 30 min  Charges: OT General Charges $OT Visit: 1 Visit OT Treatments $Self Care/Home Management : 8-22 mins  Delight Stare, [place] Pager [phone] Office [phone]    Delight Stare 10/30/2018, 10:15 AM

## 2018-10-30 NOTE — Care Management Important Message (Signed)
Important Message  Patient Details  Name: Tina [name] MRN: [id number] Date of Birth: 1965-10-29   Medicare Important Message Given:  Yes    Kaylah Chiasson 10/30/2018, 2:10 PM

## 2018-10-30 NOTE — Telephone Encounter (Signed)
  Husband is calling because he would like to discuss his wife's medical issues. She is in the hospital and he wants to know what is going on with her and he isn't getting any information from the nurses at the hospital.

## 2018-10-30 NOTE — Care Management Important Message (Signed)
Important Message  Patient Details  Name: Tina [name] MRN: [id number] Date of Birth: 1966/01/29   Medicare Important Message Given:  Yes  Due to illness patient could not sign.  Unsigned copy left Primrose Oler 10/30/2018, 2:11 PM

## 2018-10-30 NOTE — Progress Notes (Signed)
STROKE TEAM PROGRESS NOTE   SUBJECTIVE (INTERVAL HISTORY) Patient has remained sleepy and has not been very cooperative with eating her meals or taking her medicines were participating with therapies.  OBJECTIVE Vitals:   10/30/18 0008 10/30/18 0355 10/30/18 0825 10/30/18 1148  BP: (!) 149/74 (!) 161/69 (!) 165/83 (!) 194/100  Pulse: 87 90 94 92  Resp: 18 18 17 17   Temp: 99.8 F (37.7 C) 99.9 F (37.7 C) 98.2 F (36.8 C) 98 F (36.7 C)  TempSrc: Oral Oral Oral Axillary  SpO2: 100% 96% 92% 98%  Weight:      Height:        CBC:  Recent Labs  Lab 10/29/18 0806 10/30/18 0715  WBC 10.0 9.8  HGB 10.3* 11.0*  HCT 32.8* 33.7*  MCV 92.4 93.1  PLT 151 176    Basic Metabolic Panel:  Recent Labs  Lab 10/24/18 0603  10/28/18 0314 10/29/18 0806 10/30/18 0715  NA 151*   < > 146* 144 145  K 3.4*   < > 3.2* 4.5 4.1  CL 116*   < > 112* 107 106  CO2 27   < > 24 24 28   GLUCOSE 174*   < > 147* 214* 160*  BUN 10   < > 7 10 8   CREATININE 1.60*   < > 0.90 1.28* 1.14*  CALCIUM 8.6*   < > 8.3* 8.7* 9.0  MG 1.8  --  1.9 1.8  --   PHOS 3.0  --   --   --   --    < > = values in this interval not displayed.    IMAGING past 24h No results found.    PHYSICAL EXAM     General - obese middle-aged Caucasian lady, not in distress Ophthalmologic - fundi not visualized due to noncooperation.  Cardiovascular - Regular rhythm, not in A. Fib.  Neuro - Patient is awake, eyes open and calm without agitation. She is able to speak few simple words but paucity of speech,   following occasional commands to answer orientation questions.  . Follows one-step commands only.  Right ocular prosthesis,.  Normal movements in the left eye.  No significant facial asymmetry, tongue midline in mouth.  Moving all extremities symmetrical now.  DTR not corporative, Sensation not cooperative, coordination no ataxia bilaterally by observation but slow action bilateral upper extremities and gait not  tested.   ASSESSMENT/PLAN Tina [name] is a 53 y.o. female with history of type I diabetes, hypertension, paroxysmal A. fib on Coumadin, Turner syndrome and legal blindness presenting with AMS, mildly hypertensive and combative. She did not receive IV t-PA due to hemorrhage.  Right thalamic hemorrhage with intraventricular extension, likely hypertensive in the setting of Coumadin coagulopathy s/p reversal  CT head - Right thalamic hemorrhage with left IVH.   MRI head - unchanged ICH centered in the right thalamus with intraventricular extension.   CTA Head - Size stable right thalamic and intraventricular clot with dilatation of the left temporal horn. Small remote left thalamic and left cerebellar infarcts.  No aneurysm, or AVM  Repeat CT 4/7 unchanged R thalamic and IVH w/ temporal horm dilation  CT repeat 4/9 slight interval decrease R thalamic hmg. Slight decrease caliber of ventricles.   2D Echo - 06/12/2018 - EF 60 - 65%.   EEG - posterior background slowing. No seizure   LDL 65   HgbA1c - 6.6  UDS - positive for benzos   VTE prophylaxis - Heparin subcu  warfarin daily prior to admission, INR 2.6, reversed with vitamin K and Kcentra.  Now on No antithrombotic given hmg  NSG consulted, no intervention needed   Therapy recommendations:  CIR, RW 5" wheels -> possible change to SNF. CIR following for now  Disposition:  Pending  DKA with type 1 diabetes  HgbA1c 5.7, at goal < 7.0  On insulin IV in the ICU, now on Lantus  Resume diet   Lantus 6 units bid   SSI 0-9  CBG monitoring  PAF  in NSR  On Coumadin at home  INR 2.6 on admission, therapeutic -> reversed with Kcentra and vitamin K  INR now 1.1  Rate controlled  Hold off antithrombotic for now due to [facility] consider future participation in the ASPIRE trial (aspirin versus Eliquis )   AKI with hypernatremia, improving  AKI - creatinine - 1.14 - monitor  Na  145  Hypertension  Stable   Treated with Cleviprex in ICU, now off . SBP goal < 180 mmHg  . Home meds:  cardizem 240, cozaar 50 bid . Resumed cardizem 240, cozaar 50 bid and metoprolol 50 bid  . Long-term BP goal normotensive  . Monitor BP - given IV prns if elevated d/t pt not taking meds  Hyperlipidemia  Lipid lowering medication PTA: Crestor 20 mg daily  LDL 65  Current lipid lowering medication: Crestor 20  Resume statin at discharge  OSA, apnea, desaturations  As per husband, patient had OSA evaluation 1 year ago, no need CPAP at home  Intermittent desaturation during sleep on this admission  On and off CPAP throughout hospitalization, depending on agitation level  Dysphagia  Secondary to stroke  SLP following  On D2 nectar thick diet  Other Stroke Risk Factors  Former cigarette smoker - quit 30 years ago  ETOH use, advised to drink no more than 1 alcoholic beverage per day.  Obesity, Body mass index is 31.34 kg/m., recommend weight loss, diet and exercise as appropriate   Previous strokes by imaging - Small remote left thalamic and left cerebellar infarcts.  Other Active Problems  INR - 2.6 on admission - reversed -> 1.1  Anemia of chronic disease- 10.3  Leukocytosis, resolved - 10.3  Hypokalemia - 4.5  Agitation/encephalopathy, improving - off precedex - avoid benzos/opiates. Pt lethargic and not interactive. Per husband, some is her baseline. She has to be constantly stimulated to not sleep all day at home prior to stroke. Started on Amantadine today. Follow.  Hospital day # 12 Recommend trial of amantadine 100 mg twice daily to improve wakefulness and hopefully more participation with therapies to help with disposition to rehab.  I spoke to the patient's husband over the phone and gave him an update about her condition and answered questions.  Continue physical occupational therapy and transferred to rehab in next few days.  Greater than 50%  time during this 25-minute visit was spent on counseling and coordination of care about her intracerebral hemorrhage and discussion with care team  Antony Contras, [place] Pager: [phone] 10/30/2018 2:14 PM   To contact Stroke Continuity provider, please refer to [email or web address] After hours, contact General Neurology

## 2018-10-31 ENCOUNTER — Inpatient Hospital Stay (HOSPITAL_COMMUNITY): Payer: HMO

## 2018-10-31 LAB — GLUCOSE, CAPILLARY
Glucose-Capillary: 152 mg/dL — ABNORMAL HIGH (ref 70–99)
Glucose-Capillary: 158 mg/dL — ABNORMAL HIGH (ref 70–99)
Glucose-Capillary: 177 mg/dL — ABNORMAL HIGH (ref 70–99)
Glucose-Capillary: 187 mg/dL — ABNORMAL HIGH (ref 70–99)
Glucose-Capillary: 312 mg/dL — ABNORMAL HIGH (ref 70–99)
Glucose-Capillary: 316 mg/dL — ABNORMAL HIGH (ref 70–99)

## 2018-10-31 MED ORDER — GLUCERNA SHAKE PO LIQD
237.0000 mL | Freq: Three times a day (TID) | ORAL | Status: DC
  Administered 2018-10-31 – 2018-11-06 (×6): 237 mL via ORAL

## 2018-10-31 NOTE — Progress Notes (Signed)
Inpatient Rehabilitation-Admissions Coordinator   Due to lethargy and agitation limiting participation with therapy, feel pt is not appropriate for CIR. AC would recommend SNF placement. AC will sign off.   Please call if questions.   Jhonnie Garner, OTR/L  Rehab Admissions Coordinator  [phone] 10/31/2018 4:54 PM

## 2018-10-31 NOTE — Progress Notes (Signed)
Patient has been left out of wrist restraints today. Patient has been calm until touched for patient care, patient still combative and challenging at times

## 2018-10-31 NOTE — Progress Notes (Signed)
Modified Barium Swallow Progress Note  Patient Details  Name: Tina [name] MRN: [id number] Date of Birth: Feb 25, 1966  Today's Date: 10/31/2018  Modified Barium Swallow completed.  Full report located under Chart Review in the Imaging Section.  Brief recommendations include the following:  Clinical Impression  Oral phase marked by decreased cohesion and lingual residue spilling to valleculae post swallow and clears with spontaneous subswallow. Mildly prolonged mastication with solid. Pharyngeal phase functional with 2 episodes of flash penetration (PAS 2) during large consecutive cusp and straw sips thin. At baseline despite cueing during treatment sessions and current study she has been unable to comply with small sips. Intermittent vallecular residue which mostly cleared during study. Pt will need full supervision given impulsivity and visual disturbance; upgrade texture to Dys 3, remove cup after 3-4 sips, whole meds in puree. ST will continue to follow.           Swallow Evaluation Recommendations       SLP Diet Recommendations: Dysphagia 3 (Mech soft) solids;Thin liquid   Liquid Administration via: Cup;No straw   Medication Administration: Whole meds with puree   Supervision: Staff to assist with self feeding;Full assist for feeding;Full supervision/cueing for compensatory strategies   Compensations: Minimize environmental distractions;Slow rate;Small sips/bites   Postural Changes: Seated upright at 90 degrees   Oral Care Recommendations: Oral care BID        Houston Siren 10/31/2018,1:53 PM   Orbie Pyo Nebo.Ed Risk analyst [phone] Office [phone]

## 2018-10-31 NOTE — Progress Notes (Signed)
  Speech Language Pathology  Patient Details Name: [name] MRN: [id number] DOB: 02/20/1966 Today's Date: 10/31/2018 Time:  -       MBS scheduled for today at 11:30.                          Houston Siren 10/31/2018, 8:12 AM   Orbie Pyo Colvin Caroli.Ed Risk analyst [phone] Office [phone]

## 2018-10-31 NOTE — Progress Notes (Signed)
Nutrition Follow-up   RD working remotely.  DOCUMENTATION CODES:   Obesity unspecified  INTERVENTION:  Continue Magic cup TID with meals, each supplement provides 290 kcal and 9 grams of protein.  Provide Glucerna Shake po TID, each supplement provides 220 kcal and 10 grams of protein.  Encourage adequate PO intake.   NUTRITION DIAGNOSIS:   Inadequate oral intake related to dysphagia, other (see comment)(restricted diet) as evidenced by estimated needs.; ongoing  GOAL:   Patient will meet greater than or equal to 90% of their needs; progressing  MONITOR:   PO intake, Supplement acceptance, Weight trends, Labs, Diet advancement  REASON FOR ASSESSMENT:   LOS, Other (Comment)(poor PO)    ASSESSMENT:   53 yo female, admitted with stroke. PMH significant for DM type 1, diverticulosis, HLD, HTN, legal blindness, tubular adenoma of colon. Pt with CT head right thalamic hemorrhage with left IVH.  Diet has been advanced to a dysphagia 3 diet with thin liquids. Meal completion 25%. RD to order nutritional supplements to aid in caloric and protein needs. Pt encouraged to eat her food at meals. Labs and medications reviewed.   Diet Order:   Diet Order            DIET DYS 3 Room service appropriate? Yes; Fluid consistency: Thin  Diet effective now              EDUCATION NEEDS:   No education needs have been identified at this time  Skin:  Skin Assessment: Reviewed RN Assessment  Last BM:  4/13  Height:   Ht Readings from Last 1 Encounters:  10/18/18 4\' 9"  (1.448 m)    Weight:   Wt Readings from Last 1 Encounters:  10/18/18 65.7 kg    Ideal Body Weight:  42 kg  BMI:  Body mass index is 31.34 kg/m.  Estimated Nutritional Needs:   Kcal:  1650-1800  Protein:  79-99 gm (1.2-1.5 g/kg)  Fluid:  1.6 - 1.8 L/day    Corrin Parker, MS, RD, LDN Pager # [phone] After hours/ weekend pager # [phone]

## 2018-10-31 NOTE — Progress Notes (Signed)
Patient refused CPAP. RT will continue to monitor.  

## 2018-10-31 NOTE — Progress Notes (Signed)
STROKE TEAM PROGRESS NOTE   SUBJECTIVE (INTERVAL HISTORY) Patient slightly more awake today but remains sleepy and not very cooperative.  She has been refusing her medications and participation in therapy.  She will not be appropriate for inpatient rehab and social worker has been contacted to look at skilled nursing facilities.  Patient's husband is in agreement with the plan  OBJECTIVE Vitals:   10/31/18 0415 10/31/18 0415 10/31/18 0844 10/31/18 1259  BP: (!) 165/70 (!) 165/70 (!) 184/102 (!) 184/86  Pulse: 92 91 92 98  Resp: 18 16 (!) 22 20  Temp: 98 F (36.7 C) 98 F (36.7 C) 99.8 F (37.7 C) 98 F (36.7 C)  TempSrc: Axillary Oral Oral Oral  SpO2: 94% 100% 95%   Weight:      Height:        CBC:  Recent Labs  Lab 10/29/18 0806 10/30/18 0715  WBC 10.0 9.8  HGB 10.3* 11.0*  HCT 32.8* 33.7*  MCV 92.4 93.1  PLT 151 240    Basic Metabolic Panel:  Recent Labs  Lab 10/28/18 0314 10/29/18 0806 10/30/18 0715  NA 146* 144 145  K 3.2* 4.5 4.1  CL 112* 107 106  CO2 24 24 28   GLUCOSE 147* 214* 160*  BUN 7 10 8   CREATININE 0.90 1.28* 1.14*  CALCIUM 8.3* 8.7* 9.0  MG 1.9 1.8  --     IMAGING past 24h No results found.    PHYSICAL EXAM      General - obese middle-aged Caucasian lady, not in distress Ophthalmologic - fundi not visualized due to noncooperation.  Cardiovascular - Regular rhythm, not in A. Fib.  Neuro - Patient is awake, eyes open and calm without agitation. She is able to speak few simple words but paucity of speech,   following occasional commands to answer orientation questions.  . Follows one-step commands only.  Right ocular prosthesis,.  Normal movements in the left eye.  No significant facial asymmetry, tongue midline in mouth.  Moving all extremities symmetrical now.  DTR not corporative, Sensation not cooperative, coordination no ataxia bilaterally by observation but slow action bilateral upper extremities and gait not  tested.   ASSESSMENT/PLAN Ms. Jacora Valenti-Cohen is a 53 y.o. female with history of type I diabetes, hypertension, paroxysmal A. fib on Coumadin, Turner syndrome and legal blindness presenting with AMS, mildly hypertensive and combative. She did not receive IV t-PA due to hemorrhage.  Right thalamic hemorrhage with intraventricular extension, likely hypertensive in the setting of Coumadin coagulopathy s/p reversal  CT head - Right thalamic hemorrhage with left IVH.   MRI head - unchanged ICH centered in the right thalamus with intraventricular extension.   CTA Head - Size stable right thalamic and intraventricular clot with dilatation of the left temporal horn. Small remote left thalamic and left cerebellar infarcts.  No aneurysm, or AVM  Repeat CT 4/7 unchanged R thalamic and IVH w/ temporal horm dilation  CT repeat 4/9 slight interval decrease R thalamic hmg. Slight decrease caliber of ventricles.   2D Echo - 06/12/2018 - EF 60 - 65%.   EEG - posterior background slowing. No seizure   LDL 65   HgbA1c - 6.6  UDS - positive for benzos   VTE prophylaxis - Heparin subcu  warfarin daily prior to admission, INR 2.6, reversed with vitamin K and Kcentra.  Now on No antithrombotic given hmg  NSG consulted, no intervention needed   Therapy recommendations:  CIR, RW 5" wheels -> possible change to SNF.  CIR following for now  Disposition:  Pending  DKA with type 1 diabetes  HgbA1c 5.7, at goal < 7.0  On insulin IV in the ICU, now on Lantus  Resume diet   Lantus 6 units bid   SSI 0-9  CBG monitoring  PAF  in NSR  On Coumadin at home  INR 2.6 on admission, therapeutic -> reversed with Kcentra and vitamin K  INR now 1.1  Rate controlled  Hold off antithrombotic for now due to [facility] consider future participation in the ASPIRE trial (aspirin versus Eliquis )   AKI with hypernatremia, improving  AKI - creatinine - 1.14 - monitor  Na  145  Hypertension  Stable   Treated with Cleviprex in ICU, now off . SBP goal < 180 mmHg  . Home meds:  cardizem 240, cozaar 50 bid . Resumed cardizem 240, cozaar 50 bid and metoprolol 50 bid  . Long-term BP goal normotensive  . Monitor BP - given IV prns if elevated d/t pt not taking meds  Hyperlipidemia  Lipid lowering medication PTA: Crestor 20 mg daily  LDL 65  Current lipid lowering medication: Crestor 20  Resume statin at discharge  OSA, apnea, desaturations  As per husband, patient had OSA evaluation 1 year ago, no need CPAP at home  Intermittent desaturation during sleep on this admission  On and off CPAP throughout hospitalization, depending on agitation level  Dysphagia  Secondary to stroke  SLP following  On D2 nectar thick diet  Other Stroke Risk Factors  Former cigarette smoker - quit 30 years ago  ETOH use, advised to drink no more than 1 alcoholic beverage per day.  Obesity, Body mass index is 31.34 kg/m., recommend weight loss, diet and exercise as appropriate   Previous strokes by imaging - Small remote left thalamic and left cerebellar infarcts.  Other Active Problems  INR - 2.6 on admission - reversed -> 1.1  Anemia of chronic disease- 10.3  Leukocytosis, resolved - 10.3  Hypokalemia - 4.5  Agitation/encephalopathy, improving - off precedex - avoid benzos/opiates. Pt more awake today, but still limitations that are likely personality driven as well. Continues on Amantadine  Hospital day # 13   Continue amantadine for arousal.  Continue mobilize out of bed in therapies.  Transfer to skilled nursing facility over the next few days.  Antony Contras, MD Medical Director [facility] Pager: [phone] 10/31/2018 1:33 PM   To contact Stroke Continuity provider, please refer to [email or web address] After hours, contact General Neurology

## 2018-10-31 NOTE — TOC Initial Note (Signed)
Transition of Care [facility]) - Initial/Assessment Note    Patient Details  Name: Tina [name] MRN: [id number] Date of Birth: [birth year]  Transition of Care [facility]) CM/SW Contact:    Geralynn Ochs, LCSW Phone Number: 10/31/2018, 2:21 PM  Clinical Narrative:  Patient from home with spouse. Husband says that patient had been sleeping most of the day prior to admission, would need a lot of encouragement to get up and do things. He said she would at least get up and cook dinner, sometimes walk the dog if she felt up to it, but usually was in bed and sleeping. Husband said she was able to get up and go to the bathroom on her own. Husband has difficulty with his own mobility, will not be able to provide physical assist. CSW discussed recommendation for SNF, and husband is agreeable, although he would prefer for her to go home. CSW explained how the patient is requiring two people to get up and move, and husband discussed how she just needs a lot of encouragement. Husband says he feels like he could get her to participate and do more if he was able to come and see her, feels frustrated about visitation policy. Husband agreeable to fax out, provided email for list of facility options. CSW faxed out referral and emailed CMS list to patient's husband. CSW also contacted [employer] to initiate insurance authorization process. CSW to follow.                 Expected Discharge Plan: Skilled Nursing Facility Barriers to Discharge: Insurance Authorization, Continued Medical Work up   Patient Goals and CMS Choice Patient states their goals for this hospitalization and ongoing recovery are:: patient unable to participate in goal setting; husband wants the patient to come back home CMS Medicare.gov Compare Post Acute Care list provided to:: Patient Represenative (must comment) Choice offered to / list presented to : Spouse  Expected Discharge Plan and Services Expected Discharge Plan: [place] Choice: [place] arrangements for the past 2 months: Single Family Home                          Prior Living Arrangements/Services Living arrangements for the past 2 months: Single Family Home Lives with:: Spouse Patient language and need for interpreter reviewed:: No        Need for Family Participation in Patient Care: Yes (Comment) Care giver support system in place?: Yes (comment)(patient spouse is available, but unable to provide physical assist)   Criminal Activity/Legal Involvement Pertinent to Current Situation/Hospitalization: No - Comment as needed  Activities of Daily Living      Permission Sought/Granted      Share Information with NAME: [name]  Permission granted to share info w AGENCY: SNF  Permission granted to share info w Relationship: Spouse     Emotional Assessment Appearance:: Appears stated age Attitude/Demeanor/Rapport: Unable to Assess Affect (typically observed): Unable to Assess   Alcohol / Substance Use: Not Applicable Psych Involvement: No (comment)  Admission diagnosis:  ICH (intracerebral hemorrhage) ([place]) [I61.9] Anticoagulated [Z79.01] Right-sided nontraumatic intracerebral hemorrhage, unspecified cerebral location [facility]) [I61.9] Patient Active Problem List   Diagnosis Date Noted  . Intracerebral hemorrhage 10/20/2018  . Intracranial hemorrhage ([place]) 10/18/2018  . Encounter for therapeutic drug monitoring 06/19/2018  . Atrial fibrillation ([place]) 06/19/2018  . Atrial fibrillation with RVR ([place]) 06/11/2018  . IDDM (insulin dependent diabetes mellitus) ([place])  11/17/2013  . Hypersomnolence 09/18/2013  . Chronic cough 09/18/2013  . Abnormal transaminases 06/04/2013  . Abnormal alkaline phosphatase test 06/04/2013  . PAC (premature atrial contraction) 09/14/2011  . Reflux 08/26/2011  . Hyperglycemia 08/24/2011  . History of retinal detachment 08/24/2011  . Vitreous hemorrhage  ([place]) 08/24/2011  . UTI (lower urinary tract infection) 08/24/2011  . HTN (hypertension) 08/24/2011  . Palpitations 10/25/2010   PCP:  Vernie Shanks, MD Pharmacy:   [facility] [place], [address] AT [address] [place] [address] Phone: [phone] Fax: [phone]     Social Determinants of Health (SDOH) Interventions    Readmission Risk Interventions No flowsheet data found.

## 2018-10-31 NOTE — Progress Notes (Signed)
Physical Therapy Treatment Patient Details Name: Tina [name] MRN: [id number] DOB: May 06, 1966 Today's Date: 10/31/2018    History of Present Illness 53 year old female admitted 10/18/2018 with AMS.  Head CT = right thalamic hemorrhage with intraventricular extension. Most of the blood is in the left lateral ventricle. PMHx: DM, HTN, Paroxysmal A Fib, Turner syndrome, legal blindness.    PT Comments    Max encouragement provided for participation and transition bed>chair. Pt soiled her bed and this was the main motivating factor to get up. +2 assist required for any transitional movement. Pt continues to be agitated and combative when attempting to rouse or engage her in activity. Will continue to follow.     Follow Up Recommendations  CIR;Supervision/Assistance - 24 hour     Equipment Recommendations  Rolling walker with 5" wheels    Recommendations for Other Services Rehab consult     Precautions / Restrictions Precautions Precautions: Fall Restrictions Weight Bearing Restrictions: No    Mobility  Bed Mobility Overal bed mobility: Needs Assistance Bed Mobility: Supine to Sit     Supine to sit: +2 for physical assistance;HOB elevated;Max assist     General bed mobility comments: heavy max assist to transition to EOB, resistant to sit EOB; once sitting improved alertness.  Transfers Overall transfer level: Needs assistance Equipment used: 2 person hand held assist;Rolling walker (2 wheeled) Transfers: Sit to/from Stand   Stand pivot transfers: Max assist;+2 physical assistance       General transfer comment: Attempted with RW but pt refusing. +2 max assist provided for SPT bed>chair. Pt agitated, not assisting with transfer.   Ambulation/Gait             General Gait Details: Pt unable to progress to ambulation due to refusal.    Stairs             Wheelchair Mobility    Modified Rankin (Stroke Patients Only) Modified Rankin (Stroke Patients  Only) Pre-Morbid Rankin Score: No symptoms Modified Rankin: Moderately severe disability     Balance Overall balance assessment: Needs assistance Sitting-balance support: No upper extremity supported;Feet supported Sitting balance-Leahy Scale: Poor Sitting balance - Comments: Assist at times Postural control: Posterior lean Standing balance support: Bilateral upper extremity supported;During functional activity Standing balance-Leahy Scale: Poor Standing balance comment: relaince on external support                            Cognition Arousal/Alertness: Lethargic Behavior During Therapy: Flat affect;Agitated;Impulsive Overall Cognitive Status: Impaired/Different from baseline Area of Impairment: Following commands;Attention;Memory;Problem solving;Awareness                   Current Attention Level: Focused Memory: Decreased short-term memory Following Commands: Follows one step commands inconsistently;Follows one step commands with increased time   Awareness: Intellectual Problem Solving: Slow processing;Decreased initiation;Requires verbal cues;Requires tactile cues General Comments: pt with limited participation, requires max cueing to attend to tasks with constant redirection for safety; poor problem sovling       Exercises      General Comments        Pertinent Vitals/Pain Pain Assessment: Faces Faces Pain Scale: No hurt    Home Living                      Prior Function            PT Goals (current goals can now be found in the care plan section)  Acute Rehab PT Goals Patient Stated Goal: Go back to sleep PT Goal Formulation: Patient unable to participate in goal setting Time For Goal Achievement: 11/02/18 Potential to Achieve Goals: Good Progress towards PT goals: Progressing toward goals    Frequency    Min 4X/week      PT Plan Current plan remains appropriate    Co-evaluation              AM-PAC PT "6  Clicks" Mobility   Outcome Measure  Help needed turning from your back to your side while in a flat bed without using bedrails?: A Little Help needed moving from lying on your back to sitting on the side of a flat bed without using bedrails?: A Lot Help needed moving to and from a bed to a chair (including a wheelchair)?: A Lot Help needed standing up from a chair using your arms (e.g., wheelchair or bedside chair)?: A Lot Help needed to walk in hospital room?: A Lot Help needed climbing 3-5 steps with a railing? : Total 6 Click Score: 12    End of Session Equipment Utilized During Treatment: Gait belt Activity Tolerance: Patient limited by lethargy Patient left: in chair;with call bell/phone within reach;with chair alarm set Nurse Communication: Mobility status(In room to assist with transfer) PT Visit Diagnosis: Other symptoms and signs involving the nervous system (R29.898);Other abnormalities of gait and mobility (R26.89)     Time: 7782-4235 PT Time Calculation (min) (ACUTE ONLY): 29 min  Charges:  $Gait Training: 23-37 mins                     [name], PT, DPT Acute Rehabilitation Services Pager: [phone] Office: [phone]    [name] 10/31/2018, 10:11 AM

## 2018-11-01 DIAGNOSIS — Q969 Turner's syndrome, unspecified: Secondary | ICD-10-CM

## 2018-11-01 DIAGNOSIS — E785 Hyperlipidemia, unspecified: Secondary | ICD-10-CM

## 2018-11-01 DIAGNOSIS — N179 Acute kidney failure, unspecified: Secondary | ICD-10-CM

## 2018-11-01 DIAGNOSIS — G4733 Obstructive sleep apnea (adult) (pediatric): Secondary | ICD-10-CM

## 2018-11-01 LAB — GLUCOSE, CAPILLARY
Glucose-Capillary: 195 mg/dL — ABNORMAL HIGH (ref 70–99)
Glucose-Capillary: 182 mg/dL — ABNORMAL HIGH (ref 70–99)
Glucose-Capillary: 268 mg/dL — ABNORMAL HIGH (ref 70–99)
Glucose-Capillary: 299 mg/dL — ABNORMAL HIGH (ref 70–99)
Glucose-Capillary: 220 mg/dL — ABNORMAL HIGH (ref 70–99)

## 2018-11-01 MED ORDER — DIVALPROEX SODIUM 125 MG PO CSDR
250.0000 mg | DELAYED_RELEASE_CAPSULE | Freq: Two times a day (BID) | ORAL | Status: DC
  Administered 2018-11-01 – 2018-11-02 (×2): 250 mg via ORAL
  Filled 2018-11-01 (×3): qty 2

## 2018-11-01 NOTE — Progress Notes (Signed)
CSW received a phone call from [place] with [employer]. They have received insurance authorization. That number is [id number].   CSW called and gave bed offers to the patient's husband. [facility] originally offered but receded their offer after finding out the patient has been combative.   CSW called Juliann Pulse with [facility]. They are able to take the patient. Kathy faxed over the paperwork to the patients husband to complete.   CSW will continue to assess with discharge.   Domenic Schwab, MSW, [place]

## 2018-11-01 NOTE — Progress Notes (Signed)
SLP Cancellation Note  Patient Details Name: Tina [name] MRN: [id number] DOB: Mar 15, 1966   Cancelled treatment:       Reason Eval/Treat Not Completed: Patient declined, no reason specified. Pt was approached for therapy but she refused to speak or participate in cognitive-linguistic session. SLP will re-attempt as able.   Ezrah Dembeck I. Hardin Negus, [place], [place] Office number [phone] Pager [place] 11/01/2018, 9:57 AM

## 2018-11-01 NOTE — Progress Notes (Addendum)
CSW called the following pending placements:   [facility]- Left a voicemail   [facility]- Left a voicemail  [facility]- Left a voicemail  [facility]-not able to offer, no beds available.   [facility]- Left a voicemail, Update, not able to take.   [facility]- Not able to take unless has tested negative for COVID twice.   CSW will continue to find a placement for the patient.   Domenic Schwab, MSW, [place]

## 2018-11-01 NOTE — Progress Notes (Signed)
Pt very resistant to care, combative at times. It takes lots of patient and explanations over and over again before anything is  done pt's  Refusing to take medication Vs and blood sugar checks. Husband updated on pt's non compliance to care.  Verbalized some understanding and frustration  due to the fact that he is not being allowed to come and see pt.  Nursing staff explained the situation and reassured him that staff will do their best to take care of the pt. and he appreciated it so much

## 2018-11-01 NOTE — Progress Notes (Signed)
Occupational Therapy Treatment Patient Details Name: Tina [name] MRN: [id number] DOB: 06-28-1966 Today's Date: 11/01/2018    History of present illness 53 year old female admitted 10/18/2018 with AMS.  Head CT = right thalamic hemorrhage with intraventricular extension. Most of the blood is in the left lateral ventricle. PMHx: DM, HTN, Paroxysmal A Fib, Turner syndrome, legal blindness.   OT comments  Pt requiring max cues and max encouragement to arouse and participate in therapy session; perseverating on having her back scratched and wanting to lay down. Pt able to maintain static sitting EOB intermittently with close minguard assist; requires two person maxA for stand pivot transfer to drop arm recliner today. Seated in recliner pt completed grooming ADL with hand over hand assist and max cues to initiate task, once initiated pt able to carry out task on her own. Noted pt with CIR denial, have updated d/c recommendations to reflect. Recommend SNF level therapy services a time of discharge. Will continue to follow acutely.    Follow Up Recommendations  SNF;Supervision/Assistance - 24 hour    Equipment Recommendations  Other (comment)(TBA)          Precautions / Restrictions Precautions Precautions: Fall Restrictions Weight Bearing Restrictions: No       Mobility Bed Mobility Overal bed mobility: Needs Assistance Bed Mobility: Sidelying to Sit   Sidelying to sit: Max assist;+2 for physical assistance       General bed mobility comments: Pt resisting and required heavy max assist to transition to EOB, resistant to sit EOB; once sitting improved alertness.  Transfers Overall transfer level: Needs assistance Equipment used: 2 person hand held assist;Rolling walker (2 wheeled) Transfers: Sit to/from [facility] Sit to Stand: Max assist;+2 physical assistance Stand pivot transfers: Max assist;+2 physical assistance       General transfer comment: +2 max  assist provided for SPT bed>chair. Pt somewhat agitated, not assisting with transfer.     Balance Overall balance assessment: Needs assistance Sitting-balance support: No upper extremity supported;Feet supported Sitting balance-Leahy Scale: Poor Sitting balance - Comments: Assist at times Postural control: Posterior lean Standing balance support: Bilateral upper extremity supported;During functional activity Standing balance-Leahy Scale: Poor Standing balance comment: relaince on external support                           ADL either performed or assessed with clinical judgement   ADL Overall ADL's : Needs assistance/impaired Eating/Feeding: Minimal assistance;Sitting Eating/Feeding Details (indicate cue type and reason): supervision and cues for taking small sips when drinking, some assist to hold cup Grooming: Brushing hair;Wash/dry face;Minimal assistance;Moderate assistance;Sitting Grooming Details (indicate cue type and reason): increased hand over hand assist and significant cues to initiate task; once initiated pt able to perform on her own                             Functional mobility during ADLs: Maximal assistance;+2 for physical assistance;+2 for safety/equipment General ADL Comments: pt continues to require max-totalA for all ADL and mobility     Vision   Additional Comments: requires cues to open eyes during this session   Perception     Praxis      Cognition Arousal/Alertness: Lethargic Behavior During Therapy: Flat affect;Agitated;Impulsive Overall Cognitive Status: Impaired/Different from baseline Area of Impairment: Following commands;Attention;Memory;Problem solving;Awareness                   Current Attention  Level: Focused Memory: Decreased short-term memory Following Commands: Follows one step commands inconsistently;Follows one step commands with increased time   Awareness: Intellectual Problem Solving: Slow  processing;Decreased initiation;Requires verbal cues;Requires tactile cues General Comments: pt with limited participation, requires max cueing to attend to tasks with constant redirection for safety; poor problem solving         Exercises     Shoulder Instructions       General Comments VSS    Pertinent Vitals/ Pain       Pain Assessment: Faces Faces Pain Scale: No hurt Pain Intervention(s): Monitored during session  Home Living                                          Prior Functioning/Environment              Frequency  Min 2X/week        Progress Toward Goals  OT Goals(current goals can now be found in the care plan section)  Progress towards OT goals: Progressing toward goals  Acute Rehab OT Goals Patient Stated Goal: Go back to sleep OT Goal Formulation: With patient Time For Goal Achievement: 11/02/18 Potential to Achieve Goals: Good  Plan Discharge plan needs to be updated;Frequency remains appropriate    Co-evaluation    PT/OT/SLP Co-Evaluation/Treatment: Yes Reason for Co-Treatment: Complexity of the patient's impairments (multi-system involvement);Necessary to address cognition/behavior during functional activity;For patient/therapist safety;To address functional/ADL transfers PT goals addressed during session: Mobility/safety with mobility;Balance        AM-PAC OT "6 Clicks" Daily Activity     Outcome Measure   Help from another person eating meals?: A Lot Help from another person taking care of personal grooming?: A Lot Help from another person toileting, which includes using toliet, bedpan, or urinal?: Total Help from another person bathing (including washing, rinsing, drying)?: A Lot Help from another person to put on and taking off regular upper body clothing?: A Lot Help from another person to put on and taking off regular lower body clothing?: Total 6 Click Score: 10    End of Session Equipment Utilized During  Treatment: Gait belt  OT Visit Diagnosis: Unsteadiness on feet (R26.81);Other abnormalities of gait and mobility (R26.89);Muscle weakness (generalized) (M62.81);Other symptoms and signs involving cognitive function   Activity Tolerance Patient tolerated treatment well(pt self-limiting)   Patient Left in chair;with call bell/phone within reach;with chair alarm set;with nursing/sitter in room   Nurse Communication Mobility status        Time: 6811-5726 OT Time Calculation (min): 29 min  Charges: OT General Charges $OT Visit: 1 Visit OT Treatments $Self Care/Home Management : 8-22 mins  Lou Cal, OT Supplemental Rehabilitation Services Pager [phone] Office [phone]    Raymondo Band 11/01/2018, 3:51 PM

## 2018-11-01 NOTE — Discharge Summary (Addendum)
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 Kcentra.  Now on No antithrombotic given hmg  Therapy recommendations:  SNF  Disposition:  SNF  DKA with type 1 diabetes  HgbA1c 5.7, at goal < 7.0  Treated with insulin IV in the ICU  On Lantus 12 units bid   Continue CBG monitoring  PAF  in NSR  On Coumadin at home  INR 2.6 on admission, therapeutic -> reversed with Kcentra and vitamin K  INR now 1.1  Rate controlled  Hold off antithrombotic for now due to [facility] consider future participation in the ASPIRE trial (aspirin versus Eliquis ) Dr. Leonie Man will discuss at time of followup  AKI with hypernatremia, resolved  AKI - creatinine - 1.12  Na 138  Encourage PO intake  Agitation/encephalopathy  Calmer and less agitated  off precedex, amantadine and depakote   avoid benzos/opiates.   Pt limitations are likely personality driven as well.   Hypertension  Stable   Treated with Cleviprex in ICU, now off  Home meds:  cardizem 240, cozaar 50 bid  Resumed cardizem 240, cozaar 50 bid and metoprolol 25 bid   BP goal normotensive    Hyperlipidemia  Lipid lowering medication PTA: Crestor 20 mg daily  LDL 65  Current lipid lowering medication: Crestor 20  Continue statin at discharge  OSA, apnea, desaturations  As per husband, patient had OSA evaluation 1 year ago, no need CPAP at home  Intermittent desaturation during sleep on this admission  On and off CPAP throughout hospitalization, depending on agitation level  Ongoing refusal of CPAP in hospital  Dysphagia, resolved  Secondary to stroke  Other Stroke Risk Factors  Former cigarette smoker - quit 30 years ago  ETOH use, advised to drink no more than 1 alcoholic beverage per day.  Obesity, Body mass index is 31.34 kg/m., recommend weight loss, diet and exercise as appropriate   Previous strokes by imaging - Small remote left thalamic and left cerebellar infarcts.  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 rate 15, height 4\' 9"  (1.448 m), weight 65.6 kg, SpO2 96 %. General - obese middle-aged Caucasian lady, not in distress Ophthalmologic - fundi not visualized due to noncooperation.  Cardiovascular - Regular rhythm, not in A. Fib.  Neuro - Patient is awake, eyes open and calm without agitation. She is able to speak few simple words but paucity of speech,   following occasional commands to answer orientation questions.  . Follows one-step commands only.  Right ocular prosthesis,.  Normal movements in the left  eye.  No significant facial asymmetry, tongue midline in mouth.  Moving all extremities symmetrical now.  DTR not corporative, Sensation not cooperative, coordination no ataxia bilaterally by observation but slow action bilateral upper extremities and gait not tested.  Discharge Diet   dysphagia 3 thin liquids  DISCHARGE PLAN  Disposition:  Skilled nursing facility for ongoing PT, OT and ST.   Due to hemorrhage and risk of bleeding, do not take aspirin, aspirin-containing medications, or ibuprofen products   Ongoing stroke risk factor control by Primary Care Physician at time of discharge  Follow-up Vernie Shanks, MD on MD at SNF in 2 weeks.  Follow-up in [facility] in 4 weeks, office to schedule an appointment.   45 minutes were spent preparing discharge.  Burnetta Sabin, MSN, APRN, ANVP-BC, AGPCNP-BC Advanced Practice Stroke Nurse [place] for Schedule & Pager information 11/11/2018 12:22 PM  I have personally obtained history,examined this patient, reviewed notes, independently viewed imaging studies, participated in medical decision making and plan of care.ROS completed by me personally and pertinent positives fully documented  I have made any additions or clarifications directly to the above note. Agree with note above.    Antony Contras, MD Medical Director [facility] Pager: [phone] 11/11/2018 2:59 PM

## 2018-11-01 NOTE — Progress Notes (Signed)
  Speech Language Pathology Treatment: Cognitive-Linquistic  Patient Details Name: Tina [name] MRN: [id number] DOB: [birth year] Today's Date: 11/01/2018 Time: 1210-1225 SLP Time Calculation (min) (ACUTE ONLY): 15 min  Assessment / Plan / Recommendation Clinical Impression  Cognitive-linguistic treatment was attempted again with a somewhat improved level of cooperation. With moderate cues she was able to demonstrate simple problem solving on one of three occasions to request SLP's help with provision of her needs. However, she demonstrated 0% accuracy with more structured simple reasoning tasks despite max cues. Pt was able to indicated that she needed to use the bathroom and her session continued during an ADL toileting task with the assistance of two nurse techs. Pt required max verbal and tactile cues to follow commands and cooperate during this task. She was neither responsive to education regarding the safety risks associated with her behavior nor to cueing for correction of  her unsafe behaviors (e.g., leaning forward instead of standing upright with assistance and placing her weight on unstable items). The speech pathology session was ultimately terminated to allow nurse techs to complete task with pt. SLP will continue to follow pt.    HPI HPI: 53 year old female admitted 10/18/2018 with AMS. PMH: DM, HTN, Paroxysmal A Fib, Turner syndrome, legal blindness. Head CT = right thalamic hemorrhage with intraventricular extension. Most of the blood is in the left lateral ventricle.      SLP Plan  Continue with current plan of care       Recommendations                   Oral Care Recommendations: Oral care BID Follow up Recommendations: 24 hour supervision/assistance;Skilled Nursing facility SLP Visit Diagnosis: Cognitive communication deficit (O37.858) Plan: Continue with current plan of care       Danney Bungert I. Hardin Negus, [place], [place] Office  number [phone] Pager [place] 11/01/2018, 2:03 PM

## 2018-11-01 NOTE — Progress Notes (Signed)
Patient's husband wants patient to have another MRI to see if hemorrhage has cease before discharge. MD called with no return call back. Will continue to monitor patient.

## 2018-11-01 NOTE — Progress Notes (Signed)
CSW received word that the patient will not be able to go to [facility] at this time.   CSW will continue to make a placement.   Domenic Schwab, MSW, [place]

## 2018-11-01 NOTE — Progress Notes (Signed)
Patient refused her Depakote. RN educate patient on importance to take medication without success. Will continue to monitor patient.

## 2018-11-01 NOTE — Plan of Care (Signed)
  Problem: Education: Goal: Knowledge of disease or condition will improve Outcome: Progressing   Problem: Nutrition: Goal: Risk of aspiration will decrease Outcome: Progressing   Problem: Education: Goal: Knowledge of General Education information will improve Description Including pain rating scale, medication(s)/side effects and non-pharmacologic comfort measures Outcome: Progressing   Problem: Clinical Measurements: Goal: Ability to maintain clinical measurements within normal limits will improve Outcome: Progressing Goal: Respiratory complications will improve Outcome: Progressing Goal: Cardiovascular complication will be avoided Outcome: Progressing   Problem: Safety: Goal: Ability to remain free from injury will improve Outcome: Progressing   Problem: Skin Integrity: Goal: Risk for impaired skin integrity will decrease Outcome: Progressing

## 2018-11-01 NOTE — Progress Notes (Signed)
Patient refused her insulin shot. RN encouraged patient and educated her but she refused and was combative. Will continue to monitor patient.

## 2018-11-01 NOTE — Progress Notes (Signed)
STROKE TEAM PROGRESS NOTE   SUBJECTIVE (INTERVAL HISTORY) Patient is lying in bed, fairly comfortable.  She awakens and follows few simple commands.  She states she wants to be left alone  OBJECTIVE Vitals:   11/01/18 0300 11/01/18 0500 11/01/18 0600 11/01/18 0807  BP:  (!) 136/56  (!) 139/57  Pulse: 77 81 83 92  Resp:  18 (!) 22   Temp:  98.1 F (36.7 C)  98.2 F (36.8 C)  TempSrc:  Oral  Oral  SpO2:  94% (!) 89%   Weight:      Height:        CBC:  Recent Labs  Lab 10/29/18 0806 10/30/18 0715  WBC 10.0 9.8  HGB 10.3* 11.0*  HCT 32.8* 33.7*  MCV 92.4 93.1  PLT 151 947    Basic Metabolic Panel:  Recent Labs  Lab 10/28/18 0314 10/29/18 0806 10/30/18 0715  NA 146* 144 145  K 3.2* 4.5 4.1  CL 112* 107 106  CO2 24 24 28   GLUCOSE 147* 214* 160*  BUN 7 10 8   CREATININE 0.90 1.28* 1.14*  CALCIUM 8.3* 8.7* 9.0  MG 1.9 1.8  --     IMAGING past 24h No results found.    PHYSICAL EXAM      General - obese middle-aged Caucasian lady, not in distress Ophthalmologic - fundi not visualized due to noncooperation.  Cardiovascular - Regular rhythm, not in A. Fib.  Neuro - Patient is awake, eyes open and calm without agitation. She is able to speak few simple words but paucity of speech,   following occasional commands to answer orientation questions.  . Follows one-step commands only.  Right ocular prosthesis,.  Normal movements in the left eye.  No significant facial asymmetry, tongue midline in mouth.  Moving all extremities symmetrical now.  DTR not corporative, Sensation not cooperative, coordination no ataxia bilaterally by observation but slow action bilateral upper extremities and gait not tested.   ASSESSMENT/PLAN Tina [name] is a 53 y.o. female with history of type I diabetes, hypertension, paroxysmal A. fib on Coumadin, Turner syndrome and legal blindness presenting with AMS, mildly hypertensive and combative. She did not receive IV t-PA due to  hemorrhage.  Right thalamic hemorrhage with intraventricular extension, likely hypertensive in the setting of Coumadin coagulopathy s/p reversal  CT head - Right thalamic hemorrhage with left IVH.   MRI head - unchanged ICH centered in the right thalamus with intraventricular extension.   CTA Head - Size stable right thalamic and intraventricular clot with dilatation of the left temporal horn. Small remote left thalamic and left cerebellar infarcts.  No aneurysm, or AVM  Repeat CT 4/7 unchanged R thalamic and IVH w/ temporal horm dilation  CT repeat 4/9 slight interval decrease R thalamic hmg. Slight decrease caliber of ventricles.   2D Echo - 06/12/2018 - EF 60 - 65%.   EEG - posterior background slowing. No seizure   LDL 65   HgbA1c - 6.6  UDS - positive for benzos   VTE prophylaxis - Heparin subcu  warfarin daily prior to admission, INR 2.6, reversed with vitamin K and Kcentra.  Now on No antithrombotic given hmg  NSG consulted, no intervention needed   Therapy recommendations:  SNF - medically ready for discharge when bed available  Disposition:  Pending  DKA with type 1 diabetes  HgbA1c 5.7, at goal < 7.0  On insulin IV in the ICU, now on Lantus  Lantus 6 units bid   SSI 0-9  CBG monitoring  PAF  in NSR  On Coumadin at home  INR 2.6 on admission, therapeutic -> reversed with Kcentra and vitamin K  INR now 1.1  Rate controlled  Hold off antithrombotic for now due to [facility] consider future participation in the ASPIRE trial (aspirin versus Eliquis )   AKI with hypernatremia, improving  AKI - creatinine - 1.14 - monitor  Na 145  Hypertension  Stable   Treated with Cleviprex in ICU, now off . SBP goal < 180 mmHg  . Home meds:  cardizem 240, cozaar 50 bid . Resumed cardizem 240, cozaar 50 bid and metoprolol 50 bid  . Long-term BP goal normotensive  . Monitor BP - given IV prns if elevated d/t pt not taking meds  Hyperlipidemia  Lipid  lowering medication PTA: Crestor 20 mg daily  LDL 65  Current lipid lowering medication: Crestor 20  Resume statin at discharge  OSA, apnea, desaturations  As per husband, patient had OSA evaluation 1 year ago, no need CPAP at home  Intermittent desaturation during sleep on this admission  On and off CPAP throughout hospitalization, depending on agitation level  Dysphagia  Secondary to stroke  On D2 nectar thick diet  SLP following  Other Stroke Risk Factors  Former cigarette smoker - quit 30 years ago  ETOH use, advised to drink no more than 1 alcoholic beverage per day.  Obesity, Body mass index is 31.34 kg/m., recommend weight loss, diet and exercise as appropriate   Previous strokes by imaging - Small remote left thalamic and left cerebellar infarcts.  Other Active Problems  INR - 2.6 on admission - reversed -> 1.1  Anemia of chronic disease- 10.3  Leukocytosis, resolved - 10.3  Hypokalemia - 4.5  Agitation/encephalopathy, continues to improve - off precedex - avoid benzos/opiates. Pt limitations are likely personality driven as well. Continues on Amantadine. Will add low dose depakote for agitation  Hospital day # 14   Patient appears to be slightly more alert interactive after starting amantadine.  Social worker is looking at a nursing home bed placement.  Palliative care team discussed goals of care with husband given patient's baseline legal blindness and Turner`s syndrome.  Discussed with Dr. Hilma Favors.  Greater than 50% time during this 25-minute visit was spent on counseling and patient care and discussion with care team.  Antony Contras, MD Medical Director [place] Pager: [phone] 11/01/2018 12:29 PM   To contact Stroke Continuity provider, please refer to [email or web address] After hours, contact General Neurology

## 2018-11-01 NOTE — Plan of Care (Signed)
  Problem: Education: Goal: Knowledge of patient specific risk factors addressed and post discharge goals established will improve Outcome: Not Progressing   Problem: Education: Goal: Knowledge of secondary prevention will improve Outcome: Not Progressing   Problem: Coping: Goal: Will verbalize positive feelings about self Outcome: Not Progressing

## 2018-11-01 NOTE — Progress Notes (Signed)
Physical Therapy Treatment Patient Details Name: Tina [name] MRN: [id number] DOB: 1966-01-25 Today's Date: 11/01/2018    History of Present Illness 53 year old female admitted 10/18/2018 with AMS.  Head CT = right thalamic hemorrhage with intraventricular extension. Most of the blood is in the left lateral ventricle. PMHx: DM, HTN, Paroxysmal A Fib, Turner syndrome, legal blindness.    PT Comments    Pt continues to require max encouragement to participate. Pt wanting her back scratched, and this was somewhat of a motivating factor for her to participate in session.  We were able to assist pt to chair with somewhat less agitation than yesterday, however pt not assisting [place] with transfer either. At this time, feel SNF is most appropriate, however if cognition improves and pt begins to participate, would like to reassess appropriateness for CIR. Will continue to follow.    Follow Up Recommendations  CIR;Supervision/Assistance - 24 hour     Equipment Recommendations  Rolling walker with 5" wheels    Recommendations for Other Services Rehab consult     Precautions / Restrictions Precautions Precautions: Fall Restrictions Weight Bearing Restrictions: No    Mobility  Bed Mobility Overal bed mobility: Needs Assistance Bed Mobility: Sidelying to Sit   Sidelying to sit: Max assist;+2 for physical assistance       General bed mobility comments: Pt resisting and required heavy max assist to transition to EOB, resistant to sit EOB; once sitting improved alertness.  Transfers Overall transfer level: Needs assistance Equipment used: 2 person hand held assist;Rolling walker (2 wheeled) Transfers: Sit to/from [facility] Sit to Stand: Max assist;+2 physical assistance Stand pivot transfers: Max assist;+2 physical assistance       General transfer comment: +2 max assist provided for SPT bed>chair. Pt somewhat agitated, not assisting with transfer.    Ambulation/Gait             General Gait Details: Not able to progress gait training at this time.    Stairs             Wheelchair Mobility    Modified Rankin (Stroke Patients Only) Modified Rankin (Stroke Patients Only) Pre-Morbid Rankin Score: No symptoms Modified Rankin: Severe disability     Balance Overall balance assessment: Needs assistance Sitting-balance support: No upper extremity supported;Feet supported Sitting balance-Leahy Scale: Poor Sitting balance - Comments: Assist at times Postural control: Posterior lean Standing balance support: Bilateral upper extremity supported;During functional activity Standing balance-Leahy Scale: Poor Standing balance comment: relaince on external support                            Cognition Arousal/Alertness: Lethargic Behavior During Therapy: Flat affect;Agitated;Impulsive Overall Cognitive Status: Impaired/Different from baseline Area of Impairment: Following commands;Attention;Memory;Problem solving;Awareness                   Current Attention Level: Focused Memory: Decreased short-term memory Following Commands: Follows one step commands inconsistently;Follows one step commands with increased time   Awareness: Intellectual Problem Solving: Slow processing;Decreased initiation;Requires verbal cues;Requires tactile cues General Comments: pt with limited participation, requires max cueing to attend to tasks with constant redirection for safety; poor problem solving       Exercises      General Comments        Pertinent Vitals/Pain Pain Assessment: Faces Faces Pain Scale: No hurt Pain Intervention(s): Monitored during session    Home Living  Prior Function            PT Goals (current goals can now be found in the care plan section) Acute Rehab PT Goals Patient Stated Goal: Go back to sleep PT Goal Formulation: Patient unable to participate in  goal setting Time For Goal Achievement: 11/02/18 Potential to Achieve Goals: Good Progress towards PT goals: Progressing toward goals    Frequency    Min 4X/week      PT Plan Current plan remains appropriate    Co-evaluation PT/OT/SLP Co-Evaluation/Treatment: Yes Reason for Co-Treatment: Complexity of the patient's impairments (multi-system involvement);For patient/therapist safety;To address functional/ADL transfers PT goals addressed during session: Mobility/safety with mobility;Balance        AM-PAC PT "6 Clicks" Mobility   Outcome Measure  Help needed turning from your back to your side while in a flat bed without using bedrails?: A Little Help needed moving from lying on your back to sitting on the side of a flat bed without using bedrails?: A Lot Help needed moving to and from a bed to a chair (including a wheelchair)?: A Lot Help needed standing up from a chair using your arms (e.g., wheelchair or bedside chair)?: A Lot Help needed to walk in hospital room?: A Lot Help needed climbing 3-5 steps with a railing? : Total 6 Click Score: 12    End of Session Equipment Utilized During Treatment: Gait belt Activity Tolerance: Patient limited by lethargy Patient left: in chair;with call bell/phone within reach;with chair alarm set Nurse Communication: Mobility status(In room during transfer) PT Visit Diagnosis: Other symptoms and signs involving the nervous system (R29.898);Other abnormalities of gait and mobility (R26.89)     Time: 5409-8119 PT Time Calculation (min) (ACUTE ONLY): 27 min  Charges:  $Therapeutic Activity: 8-22 mins                     [name], PT, DPT Acute Rehabilitation Services Pager: [phone] Office: [phone]    [name] 11/01/2018, 1:55 PM

## 2018-11-02 LAB — GLUCOSE, CAPILLARY
Glucose-Capillary: 142 mg/dL — ABNORMAL HIGH (ref 70–99)
Glucose-Capillary: 151 mg/dL — ABNORMAL HIGH (ref 70–99)
Glucose-Capillary: 191 mg/dL — ABNORMAL HIGH (ref 70–99)
Glucose-Capillary: 210 mg/dL — ABNORMAL HIGH (ref 70–99)
Glucose-Capillary: 240 mg/dL — ABNORMAL HIGH (ref 70–99)
Glucose-Capillary: 254 mg/dL — ABNORMAL HIGH (ref 70–99)
Glucose-Capillary: 97 mg/dL (ref 70–99)

## 2018-11-02 MED ORDER — VALPROATE SODIUM 500 MG/5ML IV SOLN
250.0000 mg | Freq: Three times a day (TID) | INTRAVENOUS | Status: DC
  Administered 2018-11-02 – 2018-11-03 (×4): 250 mg via INTRAVENOUS
  Filled 2018-11-02 (×7): qty 2.5

## 2018-11-02 NOTE — Progress Notes (Addendum)
STROKE TEAM PROGRESS NOTE   SUBJECTIVE (INTERVAL HISTORY) Patient is lying in bed,no changes.  She awakens and follows few simple commands.  She continues to refuse medications and food OBJECTIVE Vitals:   11/02/18 0012 11/02/18 0410 11/02/18 0658 11/02/18 0914  BP: (!) 156/73 (!) 155/67  (!) 182/76  Pulse: 68 69  77  Resp: 13 16    Temp: 98.4 F (36.9 C) 98.7 F (37.1 C)    TempSrc: Oral Oral    SpO2: 97% 100%    Weight:   65.6 kg   Height:        CBC:  Recent Labs  Lab 10/29/18 0806 10/30/18 0715  WBC 10.0 9.8  HGB 10.3* 11.0*  HCT 32.8* 33.7*  MCV 92.4 93.1  PLT 151 242    Basic Metabolic Panel:  Recent Labs  Lab 10/28/18 0314 10/29/18 0806 10/30/18 0715  NA 146* 144 145  K 3.2* 4.5 4.1  CL 112* 107 106  CO2 24 24 28   GLUCOSE 147* 214* 160*  BUN 7 10 8   CREATININE 0.90 1.28* 1.14*  CALCIUM 8.3* 8.7* 9.0  MG 1.9 1.8  --     IMAGING past 24h No results found.    PHYSICAL EXAM      General - obese middle-aged Caucasian lady, not in distress Ophthalmologic - fundi not visualized due to noncooperation.  Cardiovascular - Regular rhythm, not in A. Fib.  Neuro - Patient is awake, eyes open and calm without agitation. She is able to speak few simple words but paucity of speech,   following occasional commands to answer orientation questions.  . Follows one-step commands only.  Right ocular prosthesis,.  Normal movements in the left eye.  No significant facial asymmetry, tongue midline in mouth.  Moving all extremities symmetrical now.  DTR not corporative, Sensation not cooperative, coordination no ataxia bilaterally by observation but slow action bilateral upper extremities and gait not tested.   ASSESSMENT/PLAN [name] is a 53 y.o. female with history of type I diabetes, hypertension, paroxysmal A. fib on Coumadin, Turner syndrome and legal blindness presenting with AMS, mildly hypertensive and combative. She did not receive IV t-PA due to  hemorrhage.  Right thalamic hemorrhage with intraventricular extension, likely hypertensive in the setting of Coumadin coagulopathy s/p reversal  CT head - Right thalamic hemorrhage with left IVH.   MRI head - unchanged ICH centered in the right thalamus with intraventricular extension.   CTA Head - Size stable right thalamic and intraventricular clot with dilatation of the left temporal horn. Small remote left thalamic and left cerebellar infarcts.  No aneurysm, or AVM  Repeat CT 4/7 unchanged R thalamic and IVH w/ temporal horm dilation  CT repeat 4/9 slight interval decrease R thalamic hmg. Slight decrease caliber of ventricles.   2D Echo - 06/12/2018 - EF 60 - 65%.   EEG - posterior background slowing. No seizure   LDL 65   HgbA1c - 6.6  UDS - positive for benzos   VTE prophylaxis - Heparin subcu  warfarin daily prior to admission, INR 2.6, reversed with vitamin K and Kcentra.  Now on No antithrombotic given hmg  NSG consulted, no intervention needed   Therapy recommendations:  SNF - medically ready for discharge when bed available  Disposition:  Pending  DKA with type 1 diabetes  HgbA1c 5.7, at goal < 7.0  On insulin IV in the ICU, now on Lantus  Lantus 6 units bid   SSI 0-9  CBG monitoring  PAF  in NSR  On Coumadin at home  INR 2.6 on admission, therapeutic -> reversed with Kcentra and vitamin K  INR now 1.1  Rate controlled  Hold off antithrombotic for now due to [facility] consider future participation in the ASPIRE trial (aspirin versus Eliquis )   AKI with hypernatremia, improving  AKI - creatinine - 1.14 - monitor  Na 145  Hypertension  Stable   Treated with Cleviprex in ICU, now off . SBP goal < 180 mmHg  . Home meds:  cardizem 240, cozaar 50 bid . Resumed cardizem 240, cozaar 50 bid and metoprolol 50 bid  . Long-term BP goal normotensive  . Monitor BP - given IV prns if elevated d/t pt not taking meds  Hyperlipidemia  Lipid  lowering medication PTA: Crestor 20 mg daily  LDL 65  Current lipid lowering medication: Crestor 20  Resume statin at discharge  OSA, apnea, desaturations  As per husband, patient had OSA evaluation 1 year ago, no need CPAP at home  Intermittent desaturation during sleep on this admission  On and off CPAP throughout hospitalization, depending on agitation level  Dysphagia  Secondary to stroke  On D2 nectar thick diet  SLP following  Other Stroke Risk Factors  Former cigarette smoker - quit 30 years ago  ETOH use, advised to drink no more than 1 alcoholic beverage per day.  Obesity, Body mass index is 31.3 kg/m., recommend weight loss, diet and exercise as appropriate   Previous strokes by imaging - Small remote left thalamic and left cerebellar infarcts.  Other Active Problems  INR - 2.6 on admission - reversed -> 1.1  Anemia of chronic disease- 10.3  Leukocytosis, resolved - 10.3  Hypokalemia - 4.5  Agitation/encephalopathy, continues to improve - off precedex - avoid benzos/opiates. Pt limitations are likely personality driven as well. Continues on Amantadine. Will add low dose depakote for agitation but change it to IV for compliance  Hospital day # 15   Patient appears to be slightly more alert interactive after starting amantadine.  Social worker is looking at a nursing home bed placement.  Change Depacon to IV. I spoke to her husband and gave him an update and answered questions.  Antony Contras, MD Medical Director [facility] Pager: [phone] 11/02/2018 12:19 PM   To contact Stroke Continuity provider, please refer to [email or web address] After hours, contact General Neurology

## 2018-11-02 NOTE — Progress Notes (Signed)
  Speech Language Pathology Treatment: Dysphagia  Patient Details Name: Tina [name] MRN: [id number] DOB: 03-23-1966 Today's Date: 11/02/2018 Time: 2130-8657 SLP Time Calculation (min) (ACUTE ONLY): 26 min  Assessment / Plan / Recommendation Clinical Impression  Patient on side in bed, but woke to her name. She only answered 2 questions, but with a lot of encouragement did take limited trials of mixed fruit cup and water by cup.  She continues to exhit prolonged mastication with solids, but no s/s of aspiration, clear vocal quality. Recommend continueing current diet of Dys 3, thin liquids with full assistance to ensure safety with meals due to patients impulsivity.    HPI HPI: 53 year old female admitted 10/18/2018 with AMS. PMH: DM, HTN, Paroxysmal A Fib, Turner syndrome, legal blindness. Head CT = right thalamic hemorrhage with intraventricular extension. Most of the blood is in the left lateral ventricle.      SLP Plan  Cpntinue current plan.       Recommendations  Diet recommendations: Dysphagia 3 (mechanical soft);Thin liquid Liquids provided via: Cup;No straw Medication Administration: Whole meds with liquid Supervision: Full supervision/cueing for compensatory strategies Compensations: Minimize environmental distractions;Slow rate;Small sips/bites Postural Changes and/or Swallow Maneuvers: Seated upright 90 degrees                        GO               Charlynne Cousins Hedy Garro, MA, CCC-SLP 11/02/2018 11:39 AM

## 2018-11-02 NOTE — Progress Notes (Signed)
Patient is combative, refusing care. Took PO meds this AM, refusing glucerna shakes. Took a few sips this afternoon and evening. Will continue to encourage PO intake

## 2018-11-03 LAB — GLUCOSE, CAPILLARY
Glucose-Capillary: 114 mg/dL — ABNORMAL HIGH (ref 70–99)
Glucose-Capillary: 118 mg/dL — ABNORMAL HIGH (ref 70–99)
Glucose-Capillary: 125 mg/dL — ABNORMAL HIGH (ref 70–99)
Glucose-Capillary: 126 mg/dL — ABNORMAL HIGH (ref 70–99)
Glucose-Capillary: 147 mg/dL — ABNORMAL HIGH (ref 70–99)
Glucose-Capillary: 163 mg/dL — ABNORMAL HIGH (ref 70–99)
Glucose-Capillary: 213 mg/dL — ABNORMAL HIGH (ref 70–99)

## 2018-11-03 MED ORDER — VALPROATE SODIUM 500 MG/5ML IV SOLN
500.0000 mg | Freq: Three times a day (TID) | INTRAVENOUS | Status: DC
  Administered 2018-11-03 – 2018-11-04 (×2): 500 mg via INTRAVENOUS
  Filled 2018-11-03 (×5): qty 5

## 2018-11-03 NOTE — Progress Notes (Signed)
STROKE TEAM PROGRESS NOTE   SUBJECTIVE (INTERVAL HISTORY) Lethargic, unco-operative and occassionally combative. Will increase Depacon dose.    OBJECTIVE Vitals:   11/03/18 0420 11/03/18 0810 11/03/18 1000 11/03/18 1257  BP:  (!) 158/62 (!) 157/77 (!) 157/77  Pulse: 67 75  82  Resp:  15  14  Temp:      TempSrc:      SpO2: 95% 93%  97%  Weight:      Height:        CBC:  Recent Labs  Lab 10/29/18 0806 10/30/18 0715  WBC 10.0 9.8  HGB 10.3* 11.0*  HCT 32.8* 33.7*  MCV 92.4 93.1  PLT 151 850    Basic Metabolic Panel:  Recent Labs  Lab 10/28/18 0314 10/29/18 0806 10/30/18 0715  NA 146* 144 145  K 3.2* 4.5 4.1  CL 112* 107 106  CO2 24 24 28   GLUCOSE 147* 214* 160*  BUN 7 10 8   CREATININE 0.90 1.28* 1.14*  CALCIUM 8.3* 8.7* 9.0  MG 1.9 1.8  --     IMAGING past 24h No results found.    PHYSICAL EXAM      General - obese middle-aged Caucasian lady, not in distress Ophthalmologic - fundi not visualized due to noncooperation.  Cardiovascular - Regular rhythm, not in A. Fib.  Neuro - Patient is awake, eyes open and calm without agitation. She is able to speak few simple words but paucity of speech,   following occasional commands to answer orientation questions.  . Follows one-step commands only.  Right ocular prosthesis,.  Normal movements in the left eye.  No significant facial asymmetry, tongue midline in mouth.  Moving all extremities symmetrical now.  DTR not corporative, Sensation not cooperative, coordination no ataxia bilaterally by observation but slow action bilateral upper extremities and gait not tested.   ASSESSMENT/PLAN Tina [name] is a 53 y.o. female with history of type I diabetes, hypertension, paroxysmal A. fib on Coumadin, Turner syndrome and legal blindness presenting with AMS, mildly hypertensive and combative. She did not receive IV t-PA due to hemorrhage.  Right thalamic hemorrhage with intraventricular extension, likely  hypertensive in the setting of Coumadin coagulopathy s/p reversal  CT head - Right thalamic hemorrhage with left IVH.   MRI head - unchanged ICH centered in the right thalamus with intraventricular extension.   CTA Head - Size stable right thalamic and intraventricular clot with dilatation of the left temporal horn. Small remote left thalamic and left cerebellar infarcts.  No aneurysm, or AVM  Repeat CT 4/7 unchanged R thalamic and IVH w/ temporal horm dilation  CT repeat 4/9 slight interval decrease R thalamic hmg. Slight decrease caliber of ventricles.   2D Echo - 06/12/2018 - EF 60 - 65%.   EEG - posterior background slowing. No seizure   LDL 65   HgbA1c - 6.6  UDS - positive for benzos   VTE prophylaxis - Heparin subcu  warfarin daily prior to admission, INR 2.6, reversed with vitamin K and Kcentra.  Now on No antithrombotic given hmg  NSG consulted, no intervention needed   Therapy recommendations:  SNF - medically ready for discharge when bed available  Disposition:  Pending  DKA with type 1 diabetes  HgbA1c 5.7, at goal < 7.0  On insulin IV in the ICU, now on Lantus  Lantus 6 units bid   SSI 0-9  CBG monitoring  PAF  in NSR  On Coumadin at home  INR 2.6 on admission, therapeutic ->  reversed with Kcentra and vitamin K  INR now 1.1  Rate controlled  Hold off antithrombotic for now due to [facility] consider future participation in the ASPIRE trial (aspirin versus Eliquis )   AKI with hypernatremia, improving  AKI - creatinine - 1.14 - monitor  Na 145  Hypertension  Stable   Treated with Cleviprex in ICU, now off . SBP goal < 180 mmHg  . Home meds:  cardizem 240, cozaar 50 bid . Resumed cardizem 240, cozaar 50 bid and metoprolol 50 bid  . Long-term BP goal normotensive  . Monitor BP - given IV prns if elevated d/t pt not taking meds  Hyperlipidemia  Lipid lowering medication PTA: Crestor 20 mg daily  LDL 65  Current lipid lowering  medication: Crestor 20  Resume statin at discharge  OSA, apnea, desaturations  As per husband, patient had OSA evaluation 1 year ago, no need CPAP at home  Intermittent desaturation during sleep on this admission  On and off CPAP throughout hospitalization, depending on agitation level  Dysphagia  Secondary to stroke  On D2 nectar thick diet  SLP following  Other Stroke Risk Factors  Former cigarette smoker - quit 30 years ago  ETOH use, advised to drink no more than 1 alcoholic beverage per day.  Obesity, Body mass index is 31.3 kg/m., recommend weight loss, diet and exercise as appropriate   Previous strokes by imaging - Small remote left thalamic and left cerebellar infarcts.  Other Active Problems  INR - 2.6 on admission - reversed -> 1.1  Anemia of chronic disease- 10.3  Leukocytosis, resolved - 10.3->9.8  Hypokalemia - 4.5->4.1  Agitation/encephalopathy, continues to improve - off precedex - avoid benzos/opiates. Pt limitations are likely personality driven as well. Continues on Amantadine. Will add low dose depakote for agitation but change it to IV for compliance - Increase Depacon to 500 mg Q 8 hrs.  Hospital day # 16   Patient unfortunately continues to be uncooperative refusing her medications and participation with therapy.  Social worker is looking at a nursing home bed placement.  Change Depacon 500 mg every 8 hourly.  Check valproic acid level and ammonia level in the morning..  Patient will be difficult to place in a nursing home unless she becomes more cooperative and less combative.  Greater than 50% time during this 25-minute visit was spent on counseling and coordination of care discussion with care team Antony Contras, MD  To contact Stroke Continuity provider, please refer to [email or web address] After hours, contact General Neurology

## 2018-11-04 DIAGNOSIS — Z794 Long term (current) use of insulin: Secondary | ICD-10-CM

## 2018-11-04 DIAGNOSIS — E1310 Other specified diabetes mellitus with ketoacidosis without coma: Secondary | ICD-10-CM

## 2018-11-04 DIAGNOSIS — I619 Nontraumatic intracerebral hemorrhage, unspecified: Secondary | ICD-10-CM

## 2018-11-04 DIAGNOSIS — E119 Type 2 diabetes mellitus without complications: Secondary | ICD-10-CM

## 2018-11-04 DIAGNOSIS — I4891 Unspecified atrial fibrillation: Secondary | ICD-10-CM

## 2018-11-04 LAB — COMPREHENSIVE METABOLIC PANEL
ALT: 27 U/L (ref 0–44)
AST: 16 U/L (ref 15–41)
Albumin: 2.4 g/dL — ABNORMAL LOW (ref 3.5–5.0)
Alkaline Phosphatase: 140 U/L — ABNORMAL HIGH (ref 38–126)
Anion gap: 8 (ref 5–15)
BUN: 5 mg/dL — ABNORMAL LOW (ref 6–20)
CO2: 30 mmol/L (ref 22–32)
Calcium: 8.6 mg/dL — ABNORMAL LOW (ref 8.9–10.3)
Chloride: 100 mmol/L (ref 98–111)
Creatinine, Ser: 0.87 mg/dL (ref 0.44–1.00)
GFR calc Af Amer: >60 mL/min (ref >60)
GFR calc non Af Amer: >60 mL/min (ref >60)
Glucose, Bld: 194 mg/dL — ABNORMAL HIGH (ref 70–99)
Potassium: 3.3 mmol/L — ABNORMAL LOW (ref 3.5–5.1)
Sodium: 138 mmol/L (ref 135–145)
Total Bilirubin: 1.3 mg/dL — ABNORMAL HIGH (ref 0.3–1.2)
Total Protein: 5.4 g/dL — ABNORMAL LOW (ref 6.5–8.1)

## 2018-11-04 LAB — CBC WITH DIFFERENTIAL/PLATELET
Abs Immature Granulocytes: 0.05 10*3/uL (ref 0.00–0.07)
Basophils Absolute: 0 10*3/uL (ref 0.0–0.1)
Basophils Relative: 0 %
Eosinophils Absolute: 0.4 10*3/uL (ref 0.0–0.5)
Eosinophils Relative: 5 %
HCT: 34.7 % — ABNORMAL LOW (ref 36.0–46.0)
Hemoglobin: 11.2 g/dL — ABNORMAL LOW (ref 12.0–15.0)
Immature Granulocytes: 1 %
Lymphocytes Relative: 11 %
Lymphs Abs: 0.8 10*3/uL (ref 0.7–4.0)
MCH: 29.8 pg (ref 26.0–34.0)
MCHC: 32.3 g/dL (ref 30.0–36.0)
MCV: 92.3 fL (ref 80.0–100.0)
Monocytes Absolute: 0.6 10*3/uL (ref 0.1–1.0)
Monocytes Relative: 8 %
Neutro Abs: 5.4 10*3/uL (ref 1.7–7.7)
Neutrophils Relative %: 75 %
Platelets: 198 10*3/uL (ref 150–400)
RBC: 3.76 MIL/uL — ABNORMAL LOW (ref 3.87–5.11)
RDW: 13.9 % (ref 11.5–15.5)
WBC: 7.2 10*3/uL (ref 4.0–10.5)
nRBC: 0 % (ref 0.0–0.2)

## 2018-11-04 LAB — GLUCOSE, CAPILLARY
Glucose-Capillary: 166 mg/dL — ABNORMAL HIGH (ref 70–99)
Glucose-Capillary: 132 mg/dL — ABNORMAL HIGH (ref 70–99)
Glucose-Capillary: 107 mg/dL — ABNORMAL HIGH (ref 70–99)
Glucose-Capillary: 96 mg/dL (ref 70–99)
Glucose-Capillary: 125 mg/dL — ABNORMAL HIGH (ref 70–99)
Glucose-Capillary: 90 mg/dL (ref 70–99)

## 2018-11-04 LAB — VALPROIC ACID LEVEL: Valproic Acid Lvl: 66 ug/mL (ref 50.0–100.0)

## 2018-11-04 LAB — AMMONIA: Ammonia: 24 umol/L (ref 9–35)

## 2018-11-04 MED ORDER — VALPROIC ACID 250 MG PO CAPS
500.0000 mg | ORAL_CAPSULE | Freq: Three times a day (TID) | ORAL | Status: DC
  Filled 2018-11-04: qty 2

## 2018-11-04 MED ORDER — LORATADINE 10 MG PO TABS
10.0000 mg | ORAL_TABLET | Freq: Every day | ORAL | Status: DC
  Filled 2018-11-04: qty 1

## 2018-11-04 NOTE — Progress Notes (Signed)
Physical Therapy Treatment Patient Details Name: Tina [name] MRN: [id number] DOB: 11/29/1965 Today's Date: 11/04/2018    History of Present Illness 53 year old female admitted 10/18/2018 with AMS.  Head CT = right thalamic hemorrhage with intraventricular extension. Most of the blood is in the left lateral ventricle. PMHx: DM, HTN, Paroxysmal A Fib, Turner syndrome, legal blindness.    PT Comments    Pt was able to transition to chair this session with multimodal cues and +2 assist for balance support and safety. Pt continues to be uncooperative with therapy however was able to be convinced to take a break from the bed with greater ease today compared to prior sessions. Will continue to follow.     Follow Up Recommendations  CIR;Supervision/Assistance - 24 hour     Equipment Recommendations  Rolling walker with 5" wheels    Recommendations for Other Services Rehab consult     Precautions / Restrictions Precautions Precautions: Fall Restrictions Weight Bearing Restrictions: No    Mobility  Bed Mobility Overal bed mobility: Needs Assistance Bed Mobility: Supine to Sit     Supine to sit: Mod assist;HOB elevated     General bed mobility comments: With increased encouragement, pt was able to initiate movement to EOB. Pt came into long sitting and then required multimodal cues to swing legs around to EOB.   Transfers Overall transfer level: Needs assistance Equipment used: 2 person hand held assist;Rolling walker (2 wheeled) Transfers: Sit to/from [facility] Sit to Stand: Max assist;+2 physical assistance Stand pivot transfers: Mod assist;+2 physical assistance       General transfer comment: +2 assist continues to be required mainly due to pt's resistance to movement. Attempted with the RW but pt not cooperating and attempting to lay back down.  Ambulation/Gait                 Stairs             Wheelchair Mobility    Modified  Rankin (Stroke Patients Only) Modified Rankin (Stroke Patients Only) Pre-Morbid Rankin Score: No symptoms Modified Rankin: Severe disability     Balance Overall balance assessment: Needs assistance Sitting-balance support: No upper extremity supported;Feet supported Sitting balance-Leahy Scale: Poor Sitting balance - Comments: Assist at times Postural control: Posterior lean Standing balance support: Bilateral upper extremity supported;During functional activity Standing balance-Leahy Scale: Poor Standing balance comment: relaince on external support                            Cognition Arousal/Alertness: Lethargic Behavior During Therapy: Flat affect;Agitated;Impulsive Overall Cognitive Status: Impaired/Different from baseline Area of Impairment: Following commands;Attention;Memory;Problem solving;Awareness                   Current Attention Level: Focused Memory: Decreased short-term memory Following Commands: Follows one step commands inconsistently;Follows one step commands with increased time   Awareness: Intellectual Problem Solving: Slow processing;Decreased initiation;Requires verbal cues;Requires tactile cues General Comments: pt with limited participation, requires max cueing to attend to tasks with constant redirection for safety; poor problem solving       Exercises      General Comments        Pertinent Vitals/Pain Pain Assessment: Faces Faces Pain Scale: No hurt Pain Intervention(s): Monitored during session    Home Living                      Prior Function  PT Goals (current goals can now be found in the care plan section) Acute Rehab PT Goals Patient Stated Goal: Go back to sleep PT Goal Formulation: Patient unable to participate in goal setting Time For Goal Achievement: 11/02/18 Potential to Achieve Goals: Good Progress towards PT goals: Progressing toward goals    Frequency    Min 4X/week       PT Plan Current plan remains appropriate    Co-evaluation              AM-PAC PT "6 Clicks" Mobility   Outcome Measure  Help needed turning from your back to your side while in a flat bed without using bedrails?: A Little Help needed moving from lying on your back to sitting on the side of a flat bed without using bedrails?: A Lot Help needed moving to and from a bed to a chair (including a wheelchair)?: A Lot Help needed standing up from a chair using your arms (e.g., wheelchair or bedside chair)?: A Lot Help needed to walk in hospital room?: A Lot Help needed climbing 3-5 steps with a railing? : Total 6 Click Score: 12    End of Session Equipment Utilized During Treatment: Gait belt Activity Tolerance: Patient limited by lethargy Patient left: in chair;with call bell/phone within reach;with chair alarm set Nurse Communication: Mobility status PT Visit Diagnosis: Other symptoms and signs involving the nervous system (R29.898);Other abnormalities of gait and mobility (R26.89)     Time: 5784-6962 PT Time Calculation (min) (ACUTE ONLY): 25 min  Charges:  $Gait Training: 23-37 mins                     [name], PT, DPT Acute Rehabilitation Services Pager: [phone] Office: [phone]    [name] 11/04/2018, 1:02 PM

## 2018-11-04 NOTE — Progress Notes (Signed)
Patient refusing CPAP.  RT will continue to monitor.  

## 2018-11-04 NOTE — Progress Notes (Signed)
STROKE TEAM PROGRESS NOTE   SUBJECTIVE (INTERVAL HISTORY) Pt lying in chair, decubitus position, sleeping but easily arousable, not eating lunch or po medications. Pt kept saying "I am OK" "I will be all right".  OBJECTIVE Vitals:   11/04/18 0003 11/04/18 0417 11/04/18 0805 11/04/18 1023  BP: (!) 165/97 103/90 136/88 (!) 165/85  Pulse:  64 67 68  Resp: 15  13   Temp: 97.9 F (36.6 C)     TempSrc: Axillary     SpO2: 94% 94%    Weight:      Height:        CBC:  Recent Labs  Lab 10/30/18 0715 11/04/18 0555  WBC 9.8 7.2  NEUTROABS  --  5.4  HGB 11.0* 11.2*  HCT 33.7* 34.7*  MCV 93.1 92.3  PLT 176 947    Basic Metabolic Panel:  Recent Labs  Lab 10/29/18 0806 10/30/18 0715 11/04/18 0555  NA 144 145 138  K 4.5 4.1 3.3*  CL 107 106 100  CO2 24 28 30   GLUCOSE 214* 160* 194*  BUN 10 8 5*  CREATININE 1.28* 1.14* 0.87  CALCIUM 8.7* 9.0 8.6*  MG 1.8  --   --     IMAGING past 24h   PHYSICAL EXAM    General - obese middle-aged Caucasian lady, not in distress. Lying in chair, not cooperative on exam. Ophthalmologic - fundi not visualized due to noncooperation. Cardiovascular - Regular rhythm, not in A. Fib.  Neuro - Patient is sleepy but easily arousable, eyes open and calm without agitation. She is able to speak few simple words "I am OK" "I will be alright", but paucity of speech, following occasional commands, but not cooperative on exam. Right ocular prosthesis, with ptosis.  Normal movements in the left eye.  No significant facial asymmetry, tongue midline in mouth.  Moving all extremities symmetrical now.  DTR not corporative, Sensation and coordination not cooperative, but no ataxia bilaterally by observation and gait not tested.   ASSESSMENT/PLAN Tina [name] is a 53 y.o. female with history of type I diabetes, hypertension, paroxysmal A. fib on Coumadin, Turner syndrome and legal blindness presenting with AMS, mildly hypertensive and combative. She did  not receive IV t-PA due to hemorrhage.  Right thalamic hemorrhage with intraventricular extension, likely hypertensive in the setting of Coumadin coagulopathy s/p reversal  CT head - Right thalamic hemorrhage with left IVH.   MRI head - unchanged ICH centered in the right thalamus with intraventricular extension.   CTA Head - Size stable right thalamic and intraventricular clot with dilatation of the left temporal horn. Small remote left thalamic and left cerebellar infarcts.  No aneurysm, or AVM  Repeat CT 4/7 unchanged R thalamic and IVH w/ temporal horm dilation  CT repeat 4/9 slight interval decrease R thalamic hmg. Slight decrease caliber of ventricles.   2D Echo - 06/12/2018 - EF 60 - 65%.   EEG - posterior background slowing. No seizure   LDL 65   HgbA1c - 6.6  UDS - positive for benzos   VTE prophylaxis - Heparin subcu  warfarin daily prior to admission, INR 2.6, reversed with vitamin K and Kcentra.  Now on No antithrombotic given hmg  NSG consulted, no intervention needed   Therapy recommendations:  SNF - medically ready for discharge when bed available  Disposition:  Pending   Refusing all meds and food - will get palliative care involved - Dr. Hilma Favors is familiar with her and discussed this recommendation on Friday  DKA with type 1 diabetes  HgbA1c 5.7, at goal < 7.0  On insulin IV in the ICU, now on Lantus  Lantus 6 units bid   SSI 0-9  CBG monitoring - so far stable  PAF  in NSR  On Coumadin at home  INR 2.6 on admission, therapeutic -> reversed with Kcentra and vitamin K  INR now 1.1  Rate controlled  Hold off antithrombotic for now due to [place]  AKI with hypernatremia, resolved  AKI - creatinine - 1.28->1.14->0.87  Na 138  Encourage p.o. intake  Agitation/encephalopathy  Currently calm  off precedex   avoid benzos/opiates.   Pt limitations are likely personality driven as well.   D/c amantadine and depakote and  observe  Hypertension  Stable on the higher end   Treated with Cleviprex in ICU, now off . SBP goal < 160 mmHg  . Home meds:  cardizem 240, cozaar 50 bid . On cardizem 240, cozaar 50 bid and metoprolol 50 bid but pt refuse meds . Long-term BP goal normotensive   Hyperlipidemia  Lipid lowering medication PTA: Crestor 20 mg daily  LDL 65  Current lipid lowering medication: Crestor 20, pt so far refuse meds  Continue statin at discharge  OSA, apnea, desaturations  As per husband, patient had OSA evaluation 1 year ago, no need CPAP at home  Intermittent desaturation during sleep on this admission  On and off CPAP throughout hospitalization, depending on agitation level  Dysphagia, resolved  Secondary to stroke  On D3 thin liquids  Encourage p.o. intake  Other Stroke Risk Factors  Former cigarette smoker - quit 30 years ago  ETOH use, advised to drink no more than 1 alcoholic beverage per day.  Obesity, Body mass index is 31.3 kg/m., recommend weight loss, diet and exercise as appropriate   Previous strokes by imaging - Small remote left thalamic and left cerebellar infarcts.  Other Active Problems  INR - 2.6 on admission - reversed -> 1.1  Anemia of chronic disease- 11.4->10.3->11.0->11.2  Leukocytosis, resolved 11.9->10.0->9.8->7.2  Hypokalemia - 3.3 - pt refuse meds - monitor  Hospital day # 17   [name], MD PhD Stroke Neurology 11/04/2018 6:33 PM    To contact Stroke Continuity provider, please refer to [email or web address] After hours, contact General Neurology

## 2018-11-04 NOTE — Plan of Care (Signed)
  Problem: Coping: Goal: Will verbalize positive feelings about self Outcome: Not Progressing Goal: Will identify appropriate support needs Outcome: Not Progressing   Problem: Self-Care: Goal: Verbalization of feelings and concerns over difficulty with self-care will improve Outcome: Not Progressing   Problem: Nutrition: Goal: Dietary intake will improve Outcome: Not Progressing

## 2018-11-04 NOTE — Progress Notes (Signed)
[facility] contacted regarding patient treatment team. RN notified that neurology is the primary team now. Cheral Marker, MD with neurology paged regarding generalized rash on body. He came to bedside to assess. See new orders. Will continue to closely monitor.

## 2018-11-04 NOTE — Progress Notes (Signed)
Pt refused all morning medications after several attempts. MD made aware.

## 2018-11-05 LAB — GLUCOSE, CAPILLARY
Glucose-Capillary: 110 mg/dL — ABNORMAL HIGH (ref 70–99)
Glucose-Capillary: 120 mg/dL — ABNORMAL HIGH (ref 70–99)
Glucose-Capillary: 281 mg/dL — ABNORMAL HIGH (ref 70–99)
Glucose-Capillary: 83 mg/dL (ref 70–99)
Glucose-Capillary: 97 mg/dL (ref 70–99)

## 2018-11-05 MED ORDER — INSULIN ASPART 100 UNIT/ML ~~LOC~~ SOLN
0.0000 [IU] | Freq: Three times a day (TID) | SUBCUTANEOUS | Status: DC
  Administered 2018-11-05: 2 [IU] via SUBCUTANEOUS
  Administered 2018-11-05: 5 [IU] via SUBCUTANEOUS
  Administered 2018-11-06: 3 [IU] via SUBCUTANEOUS
  Administered 2018-11-06 – 2018-11-07 (×3): 2 [IU] via SUBCUTANEOUS
  Administered 2018-11-07 (×2): 3 [IU] via SUBCUTANEOUS
  Administered 2018-11-07: 5 [IU] via SUBCUTANEOUS
  Administered 2018-11-08: 2 [IU] via SUBCUTANEOUS
  Administered 2018-11-08: 5 [IU] via SUBCUTANEOUS
  Administered 2018-11-08: 13:00:00 7 [IU] via SUBCUTANEOUS
  Administered 2018-11-08: 18:00:00 5 [IU] via SUBCUTANEOUS

## 2018-11-05 MED ORDER — INSULIN GLARGINE 100 UNIT/ML ~~LOC~~ SOLN
5.0000 [IU] | Freq: Two times a day (BID) | SUBCUTANEOUS | Status: DC
  Administered 2018-11-05 – 2018-11-07 (×5): 5 [IU] via SUBCUTANEOUS
  Filled 2018-11-05 (×9): qty 0.05

## 2018-11-05 NOTE — TOC Progression Note (Signed)
Transition of Care [facility]) - Progression Note    Patient Details  Name: Tina [name] MRN: [id number] Date of Birth: [birth year]  Transition of Care [facility]) CM/SW [place],  Phone Number: 11/05/2018, 4:48 PM  Clinical Narrative:  CSW following for discharge plan. Noting patient more interactive and participatory today; if patient continues tomorrow, CSW to reach back out to facilities who declined patient to see if they would offer a bed for her as her behaviors are improving. CSW discussed with [employer], as well; authorization is still good through 5pm on Thursday.      Expected Discharge Plan: Skilled Nursing Facility Barriers to Discharge: Ship broker, Continued Medical Work up  Expected Discharge Plan and Services Expected Discharge Plan: [place] Choice: [place] arrangements for the past 2 months: Single Family Home                           Social Determinants of Health (SDOH) Interventions    Readmission Risk Interventions No flowsheet data found.

## 2018-11-05 NOTE — Progress Notes (Signed)
  Speech Language Pathology Treatment: Cognitive-Linquistic  Patient Details Name: Tina [name] MRN: [id number] DOB: November 10, 1965 Today's Date: 11/05/2018 Time: 3762-8315 SLP Time Calculation (min) (ACUTE ONLY): 13 min  Assessment / Plan / Recommendation Clinical Impression  Pt was seen for cognitive-linguistic treatment and was notably more participatory compared to the last time she was seen by this SLP. She was able to participate in some structured tasks with encouragement and provided the accurate year with min cues. She achieved 80% accuracy with 3-item immediate recall increasing to 100% accuracy with min. cues. She demonstrated 67% accuracy with simple reasoning tasks increasing to 100% accuracy with mod cues. The session was ultimately terminated due to progressively waning participation. SLP will continue to follow.    HPI HPI: 53 year old female admitted 10/18/2018 with AMS. PMH: DM, HTN, Paroxysmal A Fib, Turner syndrome, legal blindness. Head CT = right thalamic hemorrhage with intraventricular extension. Most of the blood is in the left lateral ventricle.      SLP Plan  Continue with current plan of care       Recommendations  Diet recommendations: Dysphagia 3 (mechanical soft);Thin liquid Liquids provided via: Cup;No straw Medication Administration: Whole meds with liquid Supervision: Intermittent supervision to cue for compensatory strategies Compensations: Minimize environmental distractions;Slow rate;Small sips/bites Postural Changes and/or Swallow Maneuvers: Seated upright 90 degrees                Oral Care Recommendations: Oral care BID Follow up Recommendations: 24 hour supervision/assistance;Skilled Nursing facility SLP Visit Diagnosis: Cognitive communication deficit (V76.160) Plan: Continue with current plan of care       Alahia Whicker I. Hardin Negus, [place], [place] Office number [phone] Pager [place] 11/05/2018, 11:52 AM

## 2018-11-05 NOTE — Progress Notes (Signed)
STROKE TEAM PROGRESS NOTE   SUBJECTIVE (INTERVAL HISTORY) Pt sitting in chair, more awake alert and able to take meds with applesauce on prompt. She was also on the phone with her husband Tina [name] and reminder him "to feed the dogs. They need food". I also talked with Tina [name] over the phone and updated him about pt current condition and treatment plan and disposition.   OBJECTIVE Vitals:   11/05/18 0018 11/05/18 0348 11/05/18 1008 11/05/18 1013  BP: (!) 160/77 (!) 176/80 (!) 187/92   Pulse: 74 73  81  Resp: 15 16    Temp: 97.6 F (36.4 C) 97.7 F (36.5 C)    TempSrc: Axillary Oral    SpO2: 96% 96%    Weight:      Height:        CBC:  Recent Labs  Lab 10/30/18 0715 11/04/18 0555  WBC 9.8 7.2  NEUTROABS  --  5.4  HGB 11.0* 11.2*  HCT 33.7* 34.7*  MCV 93.1 92.3  PLT 176 235    Basic Metabolic Panel:  Recent Labs  Lab 10/30/18 0715 11/04/18 0555  NA 145 138  K 4.1 3.3*  CL 106 100  CO2 28 30  GLUCOSE 160* 194*  BUN 8 5*  CREATININE 1.14* 0.87  CALCIUM 9.0 8.6*    IMAGING past 24h No results found.    PHYSICAL EXAM    General - obese middle-aged Caucasian lady, not in distress. Sitting in chair, calm but still not quite cooperative on exam. Ophthalmologic - fundi not visualized due to noncooperation. Cardiovascular - Regular rhythm, not in A. Fib.  Neuro - Patient is sitting in chair, awake alert, calm without agitation. She is able to speak simple sentences with her husband over the phone "feed the dogs" "they need food", but following occasional commands, but not quite cooperative on exam. Right ocular prosthesis, with ptosis.  Normal movements in the left eye.  No significant facial asymmetry, tongue midline in mouth.  Moving all extremities symmetrical now.  DTR not corporative, Sensation and coordination not cooperative, but no ataxia bilaterally by observation and gait not tested.   ASSESSMENT/PLAN [name] is a 53 y.o. female with history of type I  diabetes, hypertension, paroxysmal A. fib on Coumadin, Turner syndrome and legal blindness presenting with AMS, mildly hypertensive and combative. She did not receive IV t-PA due to hemorrhage.  Right thalamic hemorrhage with intraventricular extension, likely hypertensive in the setting of Coumadin coagulopathy s/p reversal  CT head - Right thalamic hemorrhage with left IVH.   MRI head - unchanged ICH centered in the right thalamus with intraventricular extension.   CTA Head - Size stable right thalamic and intraventricular clot with dilatation of the left temporal horn. Small remote left thalamic and left cerebellar infarcts.  No aneurysm, or AVM  Repeat CT 4/7 unchanged R thalamic and IVH w/ temporal horm dilation  CT repeat 4/9 slight interval decrease R thalamic hmg. Slight decrease caliber of ventricles.   2D Echo - 06/12/2018 - EF 60 - 65%.   EEG - posterior background slowing. No seizure   LDL 65   HgbA1c - 6.6  UDS - positive for benzos   VTE prophylaxis - Heparin subcu  warfarin daily prior to admission, INR 2.6, reversed with vitamin K and Kcentra.  Now on No antithrombotic given hmg  NSG consulted, no intervention needed   Therapy recommendations:  SNF - medically ready for discharge when bed available  Disposition:  Pending   palliative  care consult pending - appreciate help  DKA with type 1 diabetes  HgbA1c 5.7, at goal < 7.0  On insulin IV in the ICU, now on Lantus  Lantus 6 units bid   SSI 0-9  CBG monitoring - so far stable  PAF  in NSR  On Coumadin at home  INR 2.6 on admission, therapeutic -> reversed with Kcentra and vitamin K  INR now 1.1  Rate controlled  Hold off antithrombotic for now due to [place]  AKI with hypernatremia, resolved  AKI - creatinine - 1.28->1.14->0.87  Na 138  Encourage p.o. intake  Agitation/encephalopathy  Currently calm  off precedex   avoid benzos/opiates.   Pt limitations are likely personality  driven as well.   D/c amantadine and depakote and observe  Hypertension  On the higher end   Treated with Cleviprex in ICU, now off . SBP goal < 160 mmHg  . Home meds:  cardizem 240, cozaar 50 bid . On cardizem 240, cozaar 50 bid and metoprolol 50 bid but pt refuse meds . Long-term BP goal normotensive   Hyperlipidemia  Lipid lowering medication PTA: Crestor 20 mg daily  LDL 65  resumed Crestor 20, pt so far refuse meds  Continue statin at discharge  OSA, apnea, desaturations  As per husband, patient had OSA evaluation 1 year ago, no need CPAP at home  Intermittent desaturation during sleep on this admission  On and off CPAP throughout hospitalization, depending on agitation level  Refuses CPAP now  Dysphagia, resolved  Secondary to stroke  On D3 thin liquids  Encourage p.o. intake  Other Stroke Risk Factors  Former cigarette smoker - quit 30 years ago  ETOH use, advised to drink no more than 1 alcoholic beverage per day.  Obesity, Body mass index is 31.3 kg/m., recommend weight loss, diet and exercise as appropriate   Previous strokes by imaging - Small remote left thalamic and left cerebellar infarcts.  Other Active Problems  INR - 2.6 on admission - reversed -> 1.1  Anemia of chronic disease- 11.4->10.3->11.0->11.2  Leukocytosis, resolved 11.9->10.0->9.8->7.2  Hypokalemia - 3.3 - pt refuse meds - monitor  Hospital day # 18   [name], MD PhD Stroke Neurology 11/05/2018 10:15 AM    To contact Stroke Continuity provider, please refer to [email or web address] After hours, contact General Neurology

## 2018-11-05 NOTE — Care Management Important Message (Signed)
Important Message  Patient Details  Name: Clista Rainford MRN: [id number] Date of Birth: 26-Oct-1965   Medicare Important Message Given:  Yes    Lindsey Demonte 11/05/2018, 2:23 PM

## 2018-11-05 NOTE — Progress Notes (Signed)
Physical Therapy Treatment Patient Details Name: Tina [name] MRN: [id number] DOB: 01-26-1966 Today's Date: 11/05/2018    History of Present Illness 53 year old female admitted 10/18/2018 with AMS.  Head CT = right thalamic hemorrhage with intraventricular extension. Most of the blood is in the left lateral ventricle. PMHx: DM, HTN, Paroxysmal A Fib, Turner syndrome, legal blindness.    PT Comments    Pt progressing towards physical therapy goals. Was able to perform transfers with +2 min assist this session, and was overall more engaged with session. It did appear that pt had more difficulty hearing therapist today - unsure if this was actually a hearing issue or if she was not processing commands and asking "What? Huh?" for a repetition of cue. Will continue to follow and progress as able per POC.     Follow Up Recommendations  CIR;Supervision/Assistance - 24 hour     Equipment Recommendations  Rolling walker with 5" wheels    Recommendations for Other Services Rehab consult     Precautions / Restrictions Precautions Precautions: Fall Restrictions Weight Bearing Restrictions: No    Mobility  Bed Mobility Overal bed mobility: Needs Assistance Bed Mobility: Supine to Sit     Supine to sit: Min assist;+2 for safety/equipment     General bed mobility comments: With increased encouragement, pt was able to transition to EOB with min assist and increased time. Pt initially rolled onto her stomach in bed, asking therapist to scratch her back. Pt was able to transition from prone to EOB with min assist.   Transfers Overall transfer level: Needs assistance Equipment used: 2 person hand held assist Transfers: Sit to/from [facility] Sit to Stand: Min assist;+2 physical assistance Stand pivot transfers: Min assist;+2 physical assistance       General transfer comment: Assist for balance support and safety as pt powered up to full stand. She was able to take  pivotal steps around to the chair with assist and multimodal cues to back up completely to the chair prior to initiating stand>sit.   Ambulation/Gait             General Gait Details: Not able to progress gait training at this time.    Stairs             Wheelchair Mobility    Modified Rankin (Stroke Patients Only) Modified Rankin (Stroke Patients Only) Pre-Morbid Rankin Score: No symptoms Modified Rankin: Moderately severe disability     Balance Overall balance assessment: Needs assistance Sitting-balance support: No upper extremity supported;Feet supported Sitting balance-Leahy Scale: Poor Sitting balance - Comments: Assist at times Postural control: Posterior lean Standing balance support: Bilateral upper extremity supported;During functional activity Standing balance-Leahy Scale: Poor Standing balance comment: relaince on external support                            Cognition Arousal/Alertness: Lethargic Behavior During Therapy: Flat affect;Agitated;Impulsive Overall Cognitive Status: Impaired/Different from baseline Area of Impairment: Following commands;Attention;Memory;Problem solving;Awareness                   Current Attention Level: Focused Memory: Decreased short-term memory Following Commands: Follows one step commands inconsistently;Follows one step commands with increased time   Awareness: Intellectual Problem Solving: Slow processing;Decreased initiation;Requires verbal cues;Requires tactile cues General Comments: Increased engagement in session      Exercises      General Comments        Pertinent Vitals/Pain Pain Assessment: Faces Pain  Score: 0-No pain Faces Pain Scale: No hurt    Home Living                      Prior Function            PT Goals (current goals can now be found in the care plan section) Acute Rehab PT Goals Patient Stated Goal: Scratch her back PT Goal Formulation: Patient unable  to participate in goal setting Time For Goal Achievement: 11/02/18 Potential to Achieve Goals: Good Progress towards PT goals: Progressing toward goals    Frequency    Min 4X/week      PT Plan Current plan remains appropriate    Co-evaluation              AM-PAC PT "6 Clicks" Mobility   Outcome Measure  Help needed turning from your back to your side while in a flat bed without using bedrails?: A Little Help needed moving from lying on your back to sitting on the side of a flat bed without using bedrails?: A Lot Help needed moving to and from a bed to a chair (including a wheelchair)?: A Lot Help needed standing up from a chair using your arms (e.g., wheelchair or bedside chair)?: A Lot Help needed to walk in hospital room?: A Lot Help needed climbing 3-5 steps with a railing? : Total 6 Click Score: 12    End of Session Equipment Utilized During Treatment: Gait belt Activity Tolerance: Patient limited by lethargy Patient left: in chair;with call bell/phone within reach;with chair alarm set Nurse Communication: Mobility status PT Visit Diagnosis: Other symptoms and signs involving the nervous system (R29.898);Other abnormalities of gait and mobility (R26.89)     Time: 1610-9604 PT Time Calculation (min) (ACUTE ONLY): 14 min  Charges:  $Gait Training: 8-22 mins                     [name], PT, DPT Acute Rehabilitation Services Pager: [phone] Office: [phone]    [name] 11/05/2018, 11:53 AM

## 2018-11-05 NOTE — Progress Notes (Signed)
Palliative:  Chart reviewed. Spoke with RN who reports patient is more interactive today than yesterday. Took meds and ate some today. Attempted to reach out to patient's husband to discuss goals of care - no answer and voicemail full. Will continue to make attempts to speak with husband.  Thank you for this consult.  Juel Burrow, DNP, AGNP-C Palliative Medicine Team Team Phone # [phone]  Pager # [phone]

## 2018-11-05 NOTE — Progress Notes (Signed)
  Speech Language Pathology Treatment: Dysphagia  Patient Details Name: Tina [name] MRN: [id number] DOB: [birth year] Today's Date: 11/05/2018 Time: 6546-5035 SLP Time Calculation (min) (ACUTE ONLY): 24 min  Assessment / Plan / Recommendation Clinical Impression  Patient woke to SLP verbal stimulation.  Consistently states "I'm ok" but participted in dental brushing and po intake.  Educated pt to importance of oral care and posted sign indicating need for her to brush teeth am and pm - advised staff will need to help her.  Pt consumed breakfast items including milk, whole banana, eggs/sausage/potato (approx 20%) and 10% of magic cup.  She feeds herself at rapid rate and tends to use liquids to transit solids orally however mastication was efficient/WFL and she demonstrated no indication of aspiration with meal.    Recommend continuing current diet of Dys 3/thin liquids due to her impulsivity with intermittent supervision to assure tolerance/safety.  Educated pt to importance to slow rate of eating for safety however given her Turner syndrome, doubt she will generalize said information.  Light above HOB aided pt's ability to see her tray per her statement, thus posted sign indicated need to leave on light during the day due to pt's vision deficits.    Do not recommend follow up regarding swallowing as anticipate pt is near her baseline and dys3 diet will be indicated during this hospital stay.  Educated pt anad signed off regarding swallowing.    Pt did repeatedly ask about her dad, sister and states she is "afraid"- offered verbal encouragement and she thanked SlP for help eating.  Advised pt to speak to RN regarding family concerns given RN is with pt all day.     HPI HPI: 53 year old female admitted 10/18/2018 with AMS. PMH: DM, HTN, Paroxysmal A Fib, Turner syndrome, legal blindness. Head CT = right thalamic hemorrhage with intraventricular extension. Most of the blood is in the left lateral  ventricle.      SLP Plan  (swallowing goals met)       Recommendations  Diet recommendations: Dysphagia 3 (mechanical soft);Thin liquid Liquids provided via: Cup;No straw Medication Administration: Whole meds with liquid Supervision: Intermittent supervision to cue for compensatory strategies Compensations: Minimize environmental distractions;Slow rate;Small sips/bites Postural Changes and/or Swallow Maneuvers: Seated upright 90 degrees                Oral Care Recommendations: Oral care BID Follow up Recommendations: (none for swallowing) SLP Visit Diagnosis: Dysphagia, oral phase (R13.11) Plan: (swallowing goals met)       GO                Tina [name] 11/05/2018, 9:30 AM   Luanna Salk, MS [place] Pager [phone] Office [phone]

## 2018-11-05 NOTE — Plan of Care (Signed)
  Problem: Self-Care: Goal: Ability to participate in self-care as condition permits will improve Outcome: Progressing Goal: Verbalization of feelings and concerns over difficulty with self-care will improve Outcome: Progressing   Problem: Nutrition: Goal: Risk of aspiration will decrease Outcome: Progressing Goal: Dietary intake will improve Outcome: Progressing   Problem: Coping: Goal: Will verbalize positive feelings about self Outcome: Not Progressing Goal: Will identify appropriate support needs Outcome: Not Progressing

## 2018-11-05 NOTE — Progress Notes (Signed)
RT went to put patient on CPAP.  Patient has been refusing CPAP.  Patient will not tolerate CPAP at this time.  RT will continue to monitor.  Patient vitals stable on room air 97% SpO2.

## 2018-11-06 DIAGNOSIS — I491 Atrial premature depolarization: Secondary | ICD-10-CM

## 2018-11-06 LAB — BASIC METABOLIC PANEL
Anion gap: 11 (ref 5–15)
BUN: 10 mg/dL (ref 6–20)
CO2: 27 mmol/L (ref 22–32)
Calcium: 8.9 mg/dL (ref 8.9–10.3)
Chloride: 101 mmol/L (ref 98–111)
Creatinine, Ser: 1.23 mg/dL — ABNORMAL HIGH (ref 0.44–1.00)
GFR calc Af Amer: 58 mL/min — ABNORMAL LOW (ref >60)
GFR calc non Af Amer: 50 mL/min — ABNORMAL LOW (ref >60)
Glucose, Bld: 225 mg/dL — ABNORMAL HIGH (ref 70–99)
Potassium: 3.3 mmol/L — ABNORMAL LOW (ref 3.5–5.1)
Sodium: 139 mmol/L (ref 135–145)

## 2018-11-06 LAB — CBC
HCT: 33 % — ABNORMAL LOW (ref 36.0–46.0)
Hemoglobin: 10.9 g/dL — ABNORMAL LOW (ref 12.0–15.0)
MCH: 30.4 pg (ref 26.0–34.0)
MCHC: 33 g/dL (ref 30.0–36.0)
MCV: 92.2 fL (ref 80.0–100.0)
Platelets: 196 10*3/uL (ref 150–400)
RBC: 3.58 MIL/uL — ABNORMAL LOW (ref 3.87–5.11)
RDW: 13.9 % (ref 11.5–15.5)
WBC: 6.8 10*3/uL (ref 4.0–10.5)
nRBC: 0 % (ref 0.0–0.2)

## 2018-11-06 LAB — GLUCOSE, CAPILLARY
Glucose-Capillary: 183 mg/dL — ABNORMAL HIGH (ref 70–99)
Glucose-Capillary: 98 mg/dL (ref 70–99)
Glucose-Capillary: 210 mg/dL — ABNORMAL HIGH (ref 70–99)
Glucose-Capillary: 176 mg/dL — ABNORMAL HIGH (ref 70–99)
Glucose-Capillary: 191 mg/dL — ABNORMAL HIGH (ref 70–99)

## 2018-11-06 MED ORDER — POTASSIUM CHLORIDE 20 MEQ/15ML (10%) PO SOLN
40.0000 meq | ORAL | Status: AC
  Administered 2018-11-06 (×2): 40 meq via ORAL
  Filled 2018-11-06 (×2): qty 30

## 2018-11-06 MED ORDER — METOPROLOL TARTRATE 25 MG PO TABS
25.0000 mg | ORAL_TABLET | Freq: Two times a day (BID) | ORAL | Status: DC
  Administered 2018-11-07 – 2018-11-11 (×9): 25 mg via ORAL
  Filled 2018-11-06 (×10): qty 1

## 2018-11-06 NOTE — TOC Progression Note (Signed)
Transition of Care [facility]) - Progression Note    Patient Details  Name: Tina [name] MRN: [id number] Date of Birth: 03-29-1966  Transition of Care [facility]) CM/SW [place], [place] Phone Number: 11/06/2018, 4:06 PM  Clinical Narrative:   Patient continues to have no bed offers. Noting patient's improvements yesterday and today, CSW reached out to facilities that had previously offered on patient, [facility] and [facility]. Facilities continue to decline patient. Patient has no bed offers at this time.    Expected Discharge Plan: Skilled Nursing Facility Barriers to Discharge: Ship broker, Continued Medical Work up  Expected Discharge Plan and Services Expected Discharge Plan: [place] Choice: [place] arrangements for the past 2 months: Single Family Home                           Social Determinants of Health (SDOH) Interventions    Readmission Risk Interventions No flowsheet data found.

## 2018-11-06 NOTE — Progress Notes (Signed)
Palliative:  Reviewed chart and noted patient's continued improvements - eating, taking meds, working with therapy. Attempted to reach out to patient's husband to discuss goals of care - no answer and voicemail full. Will continue to make attempts to speak with husband if PMT consult is still warranted.  Thank you for this consult.  Juel Burrow, DNP, AGNP-C Palliative Medicine Team Team Phone # [phone]  Pager # [phone]  NO CHARGE

## 2018-11-06 NOTE — Progress Notes (Signed)
CPAP removed from patients room.  Patient has not been able to tolerate for multiple nights.

## 2018-11-06 NOTE — Progress Notes (Addendum)
Occupational Therapy Treatment Patient Details Name: Tina [name] MRN: [id number] DOB: 04-13-1966 Today's Date: 11/06/2018    History of present illness 53 year old female admitted 10/18/2018 with AMS.  Head CT = right thalamic hemorrhage with intraventricular extension. Most of the blood is in the left lateral ventricle. PMHx: DM, HTN, Paroxysmal A Fib, Turner syndrome, legal blindness.   OT comments  PT seen for OT today with focus on basic self care tasks, basic functional mobility and cognition including attention, following simple commands consistently, orientation, sequencing and simple problem solving during basic familiar ADL tasks. Pt with significant improvement in ability to participate as well as functional mobility and balance today.   If patient continues to improve and participation is ongoing may want to re consider CIR as husband is able to provide supervision.   Follow Up Recommendations  SNF;Supervision/Assistance - 24 hour If patient continues to participate and improve as noted over last two days may want to reconsider CIR as husband can provide supervision.   Equipment Recommendations       Recommendations for Other Services      Precautions / Restrictions Precautions Precautions: Fall Restrictions Weight Bearing Restrictions: No       Mobility Bed Mobility Overal bed mobility: Needs Assistance Bed Mobility: Supine to Sit     Supine to sit: Min assist;+2 for safety/equipment     General bed mobility comments: pt in recliner chair when therapist arrived. Pt able to scoot up in chair with vc's only as well as scoot back and forth when chair was forward.   Transfers Overall transfer level: Needs assistance Equipment used: Rolling walker (2 wheeled) Transfers: Sit to/from Stand Sit to Stand: Supervision(mod vc's) Stand pivot transfers: Min assist       General transfer comment: required max cues to engage in standing. Pt with improved performance  and participation when linked to familiar functional tasks    Balance Overall balance assessment: Needs assistance Sitting-balance support: No upper extremity supported;Feet supported Sitting balance-Leahy Scale: Fair Sitting balance - Comments: sitting edge of recliner, pt able to cross right leg over left leg and don/doff sock with supervision only for balance.  Postural control: Posterior lean Standing balance support: Bilateral upper extremity supported(light support on RW) Standing balance-Leahy Scale: Fair Standing balance comment: pt able to move from sit to stand with supervision and static standing with supervision with only light UE support on RW to adjust gown                           ADL either performed or assessed with clinical judgement   ADL Overall ADL's : Needs assistance/impaired Eating/Feeding: Minimal assistance(pt needs max encouragement for intake) Eating/Feeding Details (indicate cue type and reason): needs max encouragment for intake Grooming: Wash/dry hands;Wash/dry face;Brushing hair;Sitting Grooming Details (indicate cue type and reason): supervision and cues to initiate activities however then was able to complete today Upper Body Bathing: Minimal assistance;Sitting Upper Body Bathing Details (indicate cue type and reason): cues to initiate and for sustained activity. sitting in chair - sitting balance improved today     Upper Body Dressing : Minimal assistance;Sitting;Standing Upper Body Dressing Details (indicate cue type and reason): min a to don gown and max cues for initiation and sequencing   Lower Body Dressing Details (indicate cue type and reason): supervision to don and doff socks in sitting. Pt able to scoot in chair and move from sitting to standing to adjust gown with very  close supervsion today               General ADL Comments: pt with significantly improved mobility today - able to complete sit to stand x3 wtih close  supervision and walker placed in front of her. Also able to scoot foward and backward in chair with only vc's.       Vision   Additional Comments: pt with R eye closed during session   Perception     Praxis      Cognition Arousal/Alertness: Awake/alert(initially lethargic but easily aroused today) Behavior During Therapy: Flat affect;Impulsive Overall Cognitive Status: Impaired/Different from baseline Area of Impairment: Following commands;Attention;Memory;Problem solving;Awareness;Safety/judgement;Orientation                 Orientation Level: Disoriented to;Place;Time;Situation(pt able to state name and birthdate. I stated "I don't have any idea where I am" when asked place. Pt perseverated on birth year when asked what year it was now) Current Attention Level: Focused;Sustained(during familiar functional ADL tasks) Memory: Decreased short-term memory Following Commands: Follows one step commands inconsistently;Follows one step commands with increased time Safety/Judgement: Decreased awareness of safety;Decreased awareness of deficits Awareness: Intellectual Problem Solving: Slow processing;Decreased initiation;Requires verbal cues General Comments: Increased engagement in session today        Exercises     Shoulder Instructions       General Comments      Pertinent Vitals/ Pain       Pain Assessment: Faces Pain Score: 2  Faces Pain Scale: Hurts a little bit Pain Location: back while in recliner - pain alleviated when pt stood as well as when repositioned in chair.  Pain Descriptors / Indicators: Grimacing Pain Intervention(s): Monitored during session;Repositioned  Home Living                                          Prior Functioning/Environment              Frequency  Min 2X/week        Progress Toward Goals  OT Goals(current goals can now be found in the care plan section)  Progress towards OT goals: Progressing toward  goals  Acute Rehab OT Goals Patient Stated Goal: "Rest and relax" OT Goal Formulation: With patient Time For Goal Achievement: 11/20/18 Potential to Achieve Goals: Good  Plan Discharge plan needs to be updated;Frequency remains appropriate    Co-evaluation          OT goals addressed during session: ADL's and self-care;Other (comment)(cognition)      AM-PAC OT "6 Clicks" Daily Activity     Outcome Measure   Help from another person eating meals?: A Little(max cues and supervision however physically able to feed self with min a) Help from another person taking care of personal grooming?: A Little Help from another person toileting, which includes using toliet, bedpan, or urinal?: A Lot Help from another person bathing (including washing, rinsing, drying)?: A Lot Help from another person to put on and taking off regular upper body clothing?: A Lot Help from another person to put on and taking off regular lower body clothing?: A Lot 6 Click Score: 14    End of Session Equipment Utilized During Treatment: Gait belt  OT Visit Diagnosis: Unsteadiness on feet (R26.81);Other abnormalities of gait and mobility (R26.89);Muscle weakness (generalized) (M62.81);Other symptoms and signs involving cognitive function   Activity Tolerance Other (comment)(pt limited due  to impaired cognitive status)   Patient Left in chair;with call bell/phone within reach;with chair alarm set   Nurse Communication Mobility status        Time: 3702-3017 OT Time Calculation (min): 37 min  Charges: OT General Charges $OT Visit: 1 Visit OT Treatments $Self Care/Home Management : 23-37 mins     Quay Burow, OTR/L 11/06/2018, 12:25 PM

## 2018-11-06 NOTE — Progress Notes (Signed)
STROKE TEAM PROGRESS NOTE   SUBJECTIVE (INTERVAL HISTORY) Pt lying in chair, calm, no agitation. Just had speech therapy. Taking meds this am. PT recommend CIR. OT recommend SNF vs. CIR.   OBJECTIVE Vitals:   11/06/18 0333 11/06/18 0804 11/06/18 0825 11/06/18 1141  BP: (!) 151/67 (!) 187/74 (!) 168/67 (!) 120/59  Pulse: 77 70  (!) 48  Resp: 18 16  18   Temp: 98.1 F (36.7 C) 97.7 F (36.5 C)  98.3 F (36.8 C)  TempSrc: Oral Oral  Oral  SpO2: 97% 97%  100%  Weight:      Height:        CBC:  Recent Labs  Lab 11/04/18 0555  WBC 7.2  NEUTROABS 5.4  HGB 11.2*  HCT 34.7*  MCV 92.3  PLT 568    Basic Metabolic Panel:  Recent Labs  Lab 11/04/18 0555  NA 138  K 3.3*  CL 100  CO2 30  GLUCOSE 194*  BUN 5*  CREATININE 0.87  CALCIUM 8.6*    IMAGING past 24h No results found.    PHYSICAL EXAM    General - obese middle-aged Caucasian lady, not in distress. Sitting in chair, calm but still not quite cooperative on exam. Ophthalmologic - fundi not visualized due to noncooperation. Cardiovascular - Regular rhythm, not in A. Fib.  Neuro - Patient is sitting in chair, awake alert, calm without agitation. She is able to speak simple sentences and following occasional commands, but not quite cooperative on exam likely due to hard of hearing as she kept asking "what did you say?". Right ocular prosthesis, with ptosis.  Normal movements in the left eye.  No significant facial asymmetry, tongue midline in mouth.  Moving all extremities symmetrical now.  DTR not corporative, Sensation and coordination not cooperative, but no ataxia bilaterally by observation and gait not tested.   ASSESSMENT/PLAN Ms. Zeriyah Valenti-Cohen is a 53 y.o. female with history of type I diabetes, hypertension, paroxysmal A. fib on Coumadin, Turner syndrome and legal blindness presenting with AMS, mildly hypertensive and combative. She did not receive IV t-PA due to hemorrhage.  Right thalamic hemorrhage with  intraventricular extension, likely hypertensive in the setting of Coumadin coagulopathy s/p reversal  CT head - Right thalamic hemorrhage with left IVH.   MRI head - unchanged ICH centered in the right thalamus with intraventricular extension.   CTA Head - Size stable right thalamic and intraventricular clot with dilatation of the left temporal horn. Small remote left thalamic and left cerebellar infarcts.  No aneurysm, or AVM  Repeat CT 4/7 unchanged R thalamic and IVH w/ temporal horm dilation  CT repeat 4/9 slight interval decrease R thalamic hmg. Slight decrease caliber of ventricles.   2D Echo - 06/12/2018 - EF 60 - 65%.   EEG - posterior background slowing. No seizure   LDL 65   HgbA1c - 6.6  UDS - positive for benzos   VTE prophylaxis - Heparin subcu  warfarin daily prior to admission, INR 2.6, reversed with vitamin K and Kcentra.  Now on No antithrombotic given hmg  NSG consulted, no intervention needed   Therapy recommendations:  SNF vs. CIR. Some improvement in behavior yesterday and today  Disposition:  Pending   palliative care consult pending - appreciate help   DKA with type 1 diabetes  HgbA1c 5.7, at goal < 7.0  On insulin IV in the ICU, now on Lantus  Lantus 5 units bid   SSI 0-9  CBG monitoring - so far  stable  PAF  in NSR  On Coumadin at home  INR 2.6 on admission, therapeutic -> reversed with Kcentra and vitamin K  INR now 1.1  Rate controlled  Hold off antithrombotic for now due to [place]  AKI with hypernatremia, resolved  AKI - creatinine - 1.28->1.14->0.87  Na 138->139  Encourage p.o. intake  Agitation/encephalopathy  Calmer and less agitated  off precedex, amantadine and depakote   avoid benzos/opiates.   Pt limitations are likely personality driven as well.   Continue to observe  Hypertension  On the higher end   Treated with Cleviprex in ICU, now off . SBP goal < 160 mmHg  . Home meds:  cardizem 240, cozaar 50  bid . On cardizem 240, cozaar 50 bid and metoprolol 25 bid . Long-term BP goal normotensive   Hyperlipidemia  Lipid lowering medication PTA: Crestor 20 mg daily  LDL 65  resumed Crestor 20  Continue statin at discharge  OSA, apnea, desaturations  As per husband, patient had OSA evaluation 1 year ago, no need CPAP at home  Intermittent desaturation during sleep on this admission  On and off CPAP throughout hospitalization, depending on agitation level  Refuses CPAP  Dysphagia, resolved  Secondary to stroke  On D3 thin liquids  Encourage p.o. intake  Other Stroke Risk Factors  Former cigarette smoker - quit 30 years ago  ETOH use, advised to drink no more than 1 alcoholic beverage per day.  Obesity, Body mass index is 31.3 kg/m., recommend weight loss, diet and exercise as appropriate   Previous strokes by imaging - Small remote left thalamic and left cerebellar infarcts.  Other Active Problems  INR - 2.6 on admission - reversed -> 1.1  Anemia of chronic disease- 11.4->10.3->11.0->11.2->10.9  Leukocytosis, resolved 11.9->10.0->9.8->7.2->6.8  Hypokalemia - 3.3 - supplement  Hospital day # 35   [name], MD PhD Stroke Neurology 11/06/2018 11:52 AM    To contact Stroke Continuity provider, please refer to [email or web address] After hours, contact General Neurology

## 2018-11-06 NOTE — Progress Notes (Signed)
  Speech Language Pathology Treatment: Cognitive-Linquistic  Patient Details Name: Cereniti Curb MRN: [id number] DOB: 11-Oct-1965 Today's Date: 11/06/2018 Time: 1050-1106 SLP Time Calculation (min) (ACUTE ONLY): 16 min  Assessment / Plan / Recommendation Clinical Impression  Pt was seen for cognitive-linguistic session and her level of cooperation continues to improve. She demonstrated improved awareness of her deficits during this session and even asked the SLP, " Why is it not going right...why am I not answering the questions right?". She was educated regarding her history and current deficits and appeared satisfied with the SLP's response. She achieved 70% accuracy with simple reasoning increasing to 80% with min-mod cues. With simple problem solving she achieved 25% accuracy increasing to 50% with mod cues. However, at the end of the session she asked the SLP, "What do I do if I need to go to the bathroom at night?" She was re-educated regarding use of her call bell to alert staff and was able to show the SLP which button she would press if she needs any help. SLP will continue to follow pt.    HPI HPI: 53 year old female admitted 10/18/2018 with AMS. PMH: DM, HTN, Paroxysmal A Fib, Turner syndrome, legal blindness. Head CT = right thalamic hemorrhage with intraventricular extension. Most of the blood is in the left lateral ventricle.      SLP Plan  Continue with current plan of care       Recommendations                   Oral Care Recommendations: Oral care BID Follow up Recommendations: 24 hour supervision/assistance;Skilled Nursing facility SLP Visit Diagnosis: Cognitive communication deficit (I50.277) Plan: Continue with current plan of care       Ivie Savitt I. Hardin Negus, [place], [place] Office number [phone] Pager [place] 11/06/2018, 11:19 AM

## 2018-11-06 NOTE — Progress Notes (Signed)
Patient refusing to take medication.  Patient turns head away and states that she is not taking anything that just wants to sleep.  Patient refused to talk to spouse on phone.  Stated that just wants to be left alone  RN will continue to monitor patient

## 2018-11-06 NOTE — Progress Notes (Signed)
Inpatient Rehabilitation-Admissions Coordinator   Asked by therapy to re-screen patient for appropriateness now that she has had improvement in participation. AC will discuss case with PM&R MD tomorrow.  AC to follow up.   Jhonnie Garner, OTR/L  Rehab Admissions Coordinator  [phone] 11/06/2018 4:46 PM

## 2018-11-06 NOTE — Progress Notes (Signed)
Physical Therapy Treatment Patient Details Name: Tina [name] MRN: [id number] DOB: 01-24-1966 Today's Date: 11/06/2018    History of Present Illness 53 year old female admitted 10/18/2018 with AMS.  Head CT = right thalamic hemorrhage with intraventricular extension. Most of the blood is in the left lateral ventricle. PMHx: DM, HTN, Paroxysmal A Fib, Turner syndrome, legal blindness.    PT Comments    Pt progressing well this session. Slow to wake up and engage in session. Once sitting EOB, pt called out "I need something. I need to use the bathroom now. I need to use the bathroom now before I pee on myself." Pt impulsive with initiating transition to [facility] and required increased assist due to unsteadiness. She was able to ambulate ~150' in hall with RW and mod assist for walker management and balance. Will continue to follow and progress as able per POC.   Follow Up Recommendations  CIR;Supervision/Assistance - 24 hour     Equipment Recommendations  Rolling walker with 5" wheels    Recommendations for Other Services Rehab consult     Precautions / Restrictions Precautions Precautions: Fall Restrictions Weight Bearing Restrictions: No    Mobility  Bed Mobility Overal bed mobility: Needs Assistance Bed Mobility: Supine to Sit     Supine to sit: Min assist;+2 for safety/equipment     General bed mobility comments: With increased encouragement, pt was able to transition to EOB with min assist and increased time.   Transfers Overall transfer level: Needs assistance Equipment used: Rolling walker (2 wheeled) Transfers: Sit to/from Stand Sit to Stand: Min assist;+2 safety/equipment         General transfer comment: Impulsive to stand 2 needing to use the bathroom. Unsteady and requiring assist to maintain balance.   Ambulation/Gait Ambulation/Gait assistance: Mod assist;+2 safety/equipment Gait Distance (Feet): 150 Feet Assistive device: Rolling walker (2  wheeled) Gait Pattern/deviations: Step-through pattern;Decreased stride length;Wide base of support Gait velocity: decreased Gait velocity interpretation: <1.8 ft/sec, indicate of risk for recurrent falls General Gait Details: Pt was able to ambulate with RW and mod assist to manage walker. She was heavily drifting L throughout gait training.    Stairs             Wheelchair Mobility    Modified Rankin (Stroke Patients Only) Modified Rankin (Stroke Patients Only) Pre-Morbid Rankin Score: No symptoms Modified Rankin: Moderately severe disability     Balance Overall balance assessment: Needs assistance Sitting-balance support: No upper extremity supported;Feet supported Sitting balance-Leahy Scale: Poor Sitting balance - Comments: Assist at times Postural control: Posterior lean Standing balance support: Bilateral upper extremity supported;During functional activity Standing balance-Leahy Scale: Poor Standing balance comment: relaince on external support                            Cognition Arousal/Alertness: Lethargic Behavior During Therapy: Flat affect;Impulsive Overall Cognitive Status: Impaired/Different from baseline Area of Impairment: Following commands;Attention;Memory;Problem solving;Awareness                   Current Attention Level: Focused Memory: Decreased short-term memory Following Commands: Follows one step commands inconsistently;Follows one step commands with increased time   Awareness: Intellectual Problem Solving: Slow processing;Decreased initiation;Requires verbal cues;Requires tactile cues General Comments: Increased engagement in session      Exercises      General Comments        Pertinent Vitals/Pain Pain Assessment: Faces Pain Score: 0-No pain Faces Pain Scale: No hurt  Home Living                      Prior Function            PT Goals (current goals can now be found in the care plan  section) Acute Rehab PT Goals Patient Stated Goal: "Rest and relax" PT Goal Formulation: Patient unable to participate in goal setting Time For Goal Achievement: 11/02/18 Potential to Achieve Goals: Good Progress towards PT goals: Progressing toward goals    Frequency    Min 4X/week      PT Plan Current plan remains appropriate    Co-evaluation       OT goals addressed during session: ADL's and self-care;Other (comment)(cognition)      AM-PAC PT "6 Clicks" Mobility   Outcome Measure  Help needed turning from your back to your side while in a flat bed without using bedrails?: A Little Help needed moving from lying on your back to sitting on the side of a flat bed without using bedrails?: A Lot Help needed moving to and from a bed to a chair (including a wheelchair)?: A Lot Help needed standing up from a chair using your arms (e.g., wheelchair or bedside chair)?: A Lot Help needed to walk in hospital room?: A Lot Help needed climbing 3-5 steps with a railing? : Total 6 Click Score: 12    End of Session Equipment Utilized During Treatment: Gait belt Activity Tolerance: Patient tolerated treatment well Patient left: in chair;with call bell/phone within reach;with chair alarm set Nurse Communication: Mobility status PT Visit Diagnosis: Other symptoms and signs involving the nervous system (R29.898);Other abnormalities of gait and mobility (R26.89)     Time: 0802-2336 PT Time Calculation (min) (ACUTE ONLY): 20 min  Charges:  $Gait Training: 8-22 mins                     Tina [name], PT, Tina [name] Acute Rehabilitation Services Pager: [phone] Office: [phone]    Tina [name] 11/06/2018, 12:07 PM

## 2018-11-07 LAB — GLUCOSE, CAPILLARY
Glucose-Capillary: 167 mg/dL — ABNORMAL HIGH (ref 70–99)
Glucose-Capillary: 220 mg/dL — ABNORMAL HIGH (ref 70–99)
Glucose-Capillary: 237 mg/dL — ABNORMAL HIGH (ref 70–99)

## 2018-11-07 MED ORDER — INSULIN GLARGINE 100 UNIT/ML ~~LOC~~ SOLN
7.0000 [IU] | Freq: Two times a day (BID) | SUBCUTANEOUS | Status: DC
  Administered 2018-11-07 – 2018-11-08 (×3): 7 [IU] via SUBCUTANEOUS
  Filled 2018-11-07 (×4): qty 0.07

## 2018-11-07 MED ORDER — SODIUM CHLORIDE 0.9 % IV SOLN
INTRAVENOUS | Status: DC
  Administered 2018-11-07: 21:00:00 via INTRAVENOUS

## 2018-11-07 MED ORDER — ENSURE ENLIVE PO LIQD
237.0000 mL | Freq: Three times a day (TID) | ORAL | Status: DC
  Administered 2018-11-07 – 2018-11-10 (×6): 237 mL via ORAL

## 2018-11-07 MED ORDER — POTASSIUM CHLORIDE 20 MEQ/15ML (10%) PO SOLN
40.0000 meq | ORAL | Status: AC
  Administered 2018-11-07 (×2): 40 meq via ORAL
  Filled 2018-11-07 (×2): qty 30

## 2018-11-07 NOTE — Progress Notes (Signed)
Physical Therapy Treatment Patient Details Name: Tina [name] MRN: [id number] DOB: April 20, 1966 Today's Date: 11/07/2018    History of Present Illness 53 year old female admitted 10/18/2018 with AMS.  Head CT = right thalamic hemorrhage with intraventricular extension. Most of the blood is in the left lateral ventricle. PMHx: DM, HTN, Paroxysmal A Fib, Turner syndrome, legal blindness.    PT Comments    Pt progressing towards physical therapy goals. When PT arrived, pt had used call bell to ask for [facility]. Overall min assist required for functional activity today with RW for support. Second person helpful for equipment management as pt is impulsive at times. Noted that pt's husband preferring SNF at this time. Acute PT recommendations updated to reflect this. Will continue to follow and progress as able per POC.   Follow Up Recommendations  SNF;Supervision/Assistance - 24 hour     Equipment Recommendations  Rolling walker with 5" wheels    Recommendations for Other Services       Precautions / Restrictions Precautions Precautions: Fall Restrictions Weight Bearing Restrictions: No    Mobility  Bed Mobility Overal bed mobility: Needs Assistance Bed Mobility: Supine to Sit     Supine to sit: Min assist     General bed mobility comments: Pt was able to transition to EOB with min assist for trunk support. Pt initiating with multimodal cues.   Transfers Overall transfer level: Needs assistance Equipment used: Rolling walker (2 wheeled);None Transfers: Sit to/from [employer] to Stand: Min assist Stand pivot transfers: Min assist       General transfer comment: Pt initially stood and pivoted to [facility] without RW. Min assist provided. With RW, min guard assist provided for safety.   Ambulation/Gait Ambulation/Gait assistance: +2 safety/equipment;Min assist Gait Distance (Feet): 150 Feet Assistive device: Rolling walker (2 wheeled) Gait  Pattern/deviations: Step-through pattern;Decreased stride length;Wide base of support Gait velocity: decreased Gait velocity interpretation: 1.31 - 2.62 ft/sec, indicative of limited community ambulator General Gait Details: Min assist for balance and walker management (difficulty steering due to vision). Overall L drift was improved today. Pt able to see and steer towards MD when he was talking to her in the hallway.    Stairs             Wheelchair Mobility    Modified Rankin (Stroke Patients Only) Modified Rankin (Stroke Patients Only) Pre-Morbid Rankin Score: No symptoms Modified Rankin: Moderately severe disability     Balance Overall balance assessment: Needs assistance Sitting-balance support: No upper extremity supported;Feet supported Sitting balance-Leahy Scale: Fair   Postural control: Posterior lean Standing balance support: Bilateral upper extremity supported Standing balance-Leahy Scale: Fair Standing balance comment: statically for short bouts and then leans on the counter or walker for support.                            Cognition Arousal/Alertness: Awake/alert(initially lethargic but easily aroused today) Behavior During Therapy: Flat affect;Impulsive Overall Cognitive Status: Impaired/Different from baseline Area of Impairment: Following commands;Attention;Memory;Problem solving;Awareness;Safety/judgement;Orientation                 Orientation Level: Disoriented to;Place;Time;Situation(pt able to state name and birthdate. I stated "I don't have any idea where I am" when asked place. Pt perseverated on birth year when asked what year it was now) Current Attention Level: Focused;Sustained(during familiar functional ADL tasks) Memory: Decreased short-term memory Following Commands: Follows one step commands inconsistently;Follows one step commands with increased time Safety/Judgement:  Decreased awareness of safety;Decreased awareness of  deficits Awareness: Intellectual Problem Solving: Slow processing;Decreased initiation;Requires verbal cues General Comments: Increased engagement in session today      Exercises      General Comments        Pertinent Vitals/Pain Pain Assessment: Faces Faces Pain Scale: No hurt Pain Intervention(s): Monitored during session    Home Living                      Prior Function            PT Goals (current goals can now be found in the care plan section) Acute Rehab PT Goals Patient Stated Goal: Pt asking to use the bathroom this session PT Goal Formulation: Patient unable to participate in goal setting Time For Goal Achievement: 11/02/18 Potential to Achieve Goals: Good Progress towards PT goals: Progressing toward goals    Frequency    Min 4X/week      PT Plan Current plan remains appropriate    Co-evaluation              AM-PAC PT "6 Clicks" Mobility   Outcome Measure  Help needed turning from your back to your side while in a flat bed without using bedrails?: A Little Help needed moving from lying on your back to sitting on the side of a flat bed without using bedrails?: A Lot Help needed moving to and from a bed to a chair (including a wheelchair)?: A Lot Help needed standing up from a chair using your arms (e.g., wheelchair or bedside chair)?: A Lot Help needed to walk in hospital room?: A Lot Help needed climbing 3-5 steps with a railing? : Total 6 Click Score: 12    End of Session Equipment Utilized During Treatment: Gait belt Activity Tolerance: Patient tolerated treatment well Patient left: in chair;with call bell/phone within reach;with chair alarm set Nurse Communication: Mobility status PT Visit Diagnosis: Other symptoms and signs involving the nervous system (R29.898);Other abnormalities of gait and mobility (R26.89)     Time: 6789-3810 PT Time Calculation (min) (ACUTE ONLY): 20 min  Charges:  $Gait Training: 8-22 mins                      [name], PT, DPT Acute Rehabilitation Services Pager: [phone] Office: [phone]    [name] 11/07/2018, 3:04 PM

## 2018-11-07 NOTE — Progress Notes (Signed)
STROKE TEAM PROGRESS NOTE   SUBJECTIVE (INTERVAL HISTORY) Pt was seen today walking in the hall way with PT/OT using walker. She was calm and greeted to me. She has no difficulty with walking and only needed min assist. PT/OT recommend CIR but husband prefer SNF.   OBJECTIVE Vitals:   11/07/18 0006 11/07/18 0410 11/07/18 0745 11/07/18 1140  BP: 136/60 (!) 151/71 (!) 145/72 135/69  Pulse: 69 77 77 (!) 55  Resp: 16 15 16 18   Temp: 98.5 F (36.9 C) 98 F (36.7 C) 99.3 F (37.4 C) 98.9 F (37.2 C)  TempSrc: Oral Oral Axillary Oral  SpO2: 95% 98% 96% 96%  Weight:      Height:        CBC:  Recent Labs  Lab 11/04/18 0555 11/06/18 1308  WBC 7.2 6.8  NEUTROABS 5.4  --   HGB 11.2* 10.9*  HCT 34.7* 33.0*  MCV 92.3 92.2  PLT 198 366    Basic Metabolic Panel:  Recent Labs  Lab 11/04/18 0555 11/06/18 1308  NA 138 139  K 3.3* 3.3*  CL 100 101  CO2 30 27  GLUCOSE 194* 225*  BUN 5* 10  CREATININE 0.87 1.23*  CALCIUM 8.6* 8.9    IMAGING past 24h No results found.    PHYSICAL EXAM    General - obese middle-aged Caucasian lady, not in distress. Walking in the hallway with walker, calm. Ophthalmologic - fundi not visualized due to noncooperation. Cardiovascular - Regular rhythm, not in A. Fib.  Neuro - Patient is walking in the hallway with walker, awake, alert, calm without agitation. She is able to speak simple sentences and following simple commands, but not quite cooperative on exam due to hard of hearing. Right ocular prosthesis, with ptosis.  Normal movements in the left eye.  No significant facial asymmetry, tongue midline in mouth.  Moving all extremities symmetrical now.  DTR not corporative, Sensation subjectively symmetrical and coordination no ataxia bilaterally by observation. Walking with walker, slow stride but normal gait, slight  Stooped posturing.   ASSESSMENT/PLAN [name] is a 53 y.o. female with history of type I diabetes, hypertension,  paroxysmal A. fib on Coumadin, Turner syndrome and legal blindness presenting with AMS, mildly hypertensive and combative. She did not receive IV t-PA due to hemorrhage.  Right thalamic hemorrhage with intraventricular extension, likely hypertensive in the setting of Coumadin coagulopathy s/p reversal  CT head - Right thalamic hemorrhage with left IVH.   MRI head - unchanged ICH centered in the right thalamus with intraventricular extension.   CTA Head - Size stable right thalamic and intraventricular clot with dilatation of the left temporal horn. Small remote left thalamic and left cerebellar infarcts.  No aneurysm, or AVM  Repeat CT 4/7 unchanged R thalamic and IVH w/ temporal horm dilation  CT repeat 4/9 slight interval decrease R thalamic hmg. Slight decrease caliber of ventricles.   2D Echo - 06/12/2018 - EF 60 - 65%.   EEG - posterior background slowing. No seizure   LDL 65   HgbA1c - 6.6  UDS - positive for benzos   VTE prophylaxis - Heparin subcu  warfarin daily prior to admission, INR 2.6, reversed with vitamin K and Kcentra.  Now on No antithrombotic given hmg  NSG consulted, no intervention needed   Therapy recommendations:  SNF  Disposition:  Pending   DKA with type 1 diabetes  HgbA1c 5.7, at goal < 7.0  On insulin IV in the ICU, now on Lantus  CBG monitoring - elvated past 24h with increased intake   Lantus increase to 7 units bid   SSI 0-9  PAF  in NSR  On Coumadin at home  INR 2.6 on admission, therapeutic -> reversed with Kcentra and vitamin K  INR now 1.1  Rate controlled  Hold off antithrombotic for now due to [place]  AKI with resolved hypernatremia  AKI - creatinine - 1.28->1.14->0.87->1.23  Na 138->139  Encourage p.o. intake  Put on IVF @ 50  Agitation/encephalopathy  Calmer and less agitated  off precedex, amantadine and depakote   avoid benzos/opiates.   Pt limitations are likely personality driven as well.   Continue  to observe  More participative yesterday during the day, by night, refusing to speak with husband on the phone and wanted to be left alone  Hypertension  On the higher end   Treated with Cleviprex in ICU, now off . SBP goal < 160 mmHg  . Home meds:  cardizem 240, cozaar 50 bid . On cardizem 240, cozaar 50 bid and metoprolol 25 bid . Long-term BP goal normotensive   Hyperlipidemia  Lipid lowering medication PTA: Crestor 20 mg daily  LDL 65  resumed Crestor 20  Continue statin at discharge  OSA, apnea, desaturations  As per husband, patient had OSA evaluation 1 year ago, no need CPAP at home  Intermittent desaturation during sleep on this admission  On and off CPAP throughout hospitalization, depending on agitation level  Ongoing refusal of CPAP  Dysphagia, resolved  Secondary to stroke  On D3 thin liquids  Encourage p.o. intake  Other Stroke Risk Factors  Former cigarette smoker - quit 30 years ago  ETOH use, advised to drink no more than 1 alcoholic beverage per day.  Obesity, Body mass index is 31.3 kg/m., recommend weight loss, diet and exercise as appropriate   Previous strokes by imaging - Small remote left thalamic and left cerebellar infarcts.  Other Active Problems  INR - 2.6 on admission - reversed -> 1.1  Anemia of chronic disease- 11.4->10.3->11.0->11.2->10.9  Leukocytosis, resolved 11.9->10.0->9.8->7.2->6.8  Hypokalemia - 3.3->3.3 - supplement, check Mg.   Hospital day # 20   [name], MD PhD Stroke Neurology 11/07/2018 11:46 AM    To contact Stroke Continuity provider, please refer to [email or web address] After hours, contact General Neurology

## 2018-11-07 NOTE — Progress Notes (Signed)
Pt alert to self and place, forgetful at times of situation. Very cooperative with staff, compliant with all medication administrations, and nursing interventions. Emotional support given. Pt enjoys talking with friends and family on the phone.

## 2018-11-07 NOTE — Progress Notes (Signed)
Inpatient Rehabilitation-Admissions Coordinator   [facility] spoke with pt's husband regarding CIR program. He understands she needs rehab prior to returning home. He states he is unable to afford CIR co-pays, despite options for payment plans. He is hopeful for SNF at this time.  AC still discuss with CM/SW and will sign off.   Please call if questions.   Jhonnie Garner, OTR/L  Rehab Admissions Coordinator  [phone] 11/07/2018 12:09 PM

## 2018-11-07 NOTE — Progress Notes (Addendum)
Nutrition Follow-up   RD working remotely.  DOCUMENTATION CODES:   Obesity unspecified  INTERVENTION:  Provide Ensure Enlive po TID, each supplement provides 350 kcal and 20 grams of protein.  Encourage adequate PO intake.   NUTRITION DIAGNOSIS:   Inadequate oral intake related to dysphagia, other (see comment)(restricted diet) as evidenced by estimated needs; ongoing  GOAL:   Patient will meet greater than or equal to 90% of their needs; progressing  MONITOR:   PO intake, Supplement acceptance, Weight trends, Labs, Diet advancement  REASON FOR ASSESSMENT:   LOS, Other (Comment)(poor PO)    ASSESSMENT:   53 yo female, admitted with stroke. PMH significant for DM type 1, diverticulosis, HLD, HTN, legal blindness, tubular adenoma of colon. Pt with CT headright thalamic hemorrhage with left IVH.  Meal completion has been 0-10%. Pt has been refusing meals on occasion. RN and staff have been encouraging po intake. Palliative care to discuss goals of care with pt and husband. Pt currently has glucerna shake ordered with varied consumption. RD to switch nutritional supplements to to a more nutritionally dense Ensure shake to aid in caloric and protein needs. Labs and medications reviewed.   Diet Order:   Diet Order            DIET DYS 3 Room service appropriate? Yes; Fluid consistency: Thin  Diet effective now              EDUCATION NEEDS:   No education needs have been identified at this time  Skin:  Skin Assessment: Reviewed RN Assessment  Last BM:  4/22  Height:   Ht Readings from Last 1 Encounters:  10/18/18 4\' 9"  (1.448 m)    Weight:   Wt Readings from Last 1 Encounters:  11/02/18 65.6 kg    Ideal Body Weight:  42 kg  BMI:  Body mass index is 31.3 kg/m.  Estimated Nutritional Needs:   Kcal:  1650-1800  Protein:  79-99 gm (1.2-1.5 g/kg)  Fluid:  1.6 - 1.8 L/day    Corrin Parker, MS, RD, LDN Pager # [phone] After hours/ weekend pager  # [phone]

## 2018-11-07 NOTE — Progress Notes (Signed)
Palliative:  I have made multiple attempts to speak with husband over the past few days. Calls go to voicemail and voicemail is full and unable to leave message. Spoke with RN this morning who speaks of improvement - patient interactive, eating, taking meds, and walking down hall. Discussed case with Dr. Erlinda Hong. He will discontinue consult for now. PMT will be available if needed.   Thank you for this consult.  Juel Burrow, DNP, AGNP-C Palliative Medicine Team Team Phone # [phone]  Pager # [phone]  NO CHARGE

## 2018-11-08 LAB — CBC
HCT: 33.3 % — ABNORMAL LOW (ref 36.0–46.0)
Hemoglobin: 11.3 g/dL — ABNORMAL LOW (ref 12.0–15.0)
MCH: 31 pg (ref 26.0–34.0)
MCHC: 33.9 g/dL (ref 30.0–36.0)
MCV: 91.5 fL (ref 80.0–100.0)
Platelets: 193 10*3/uL (ref 150–400)
RBC: 3.64 MIL/uL — ABNORMAL LOW (ref 3.87–5.11)
RDW: 14 % (ref 11.5–15.5)
WBC: 6.2 10*3/uL (ref 4.0–10.5)
nRBC: 0 % (ref 0.0–0.2)

## 2018-11-08 LAB — GLUCOSE, CAPILLARY
Glucose-Capillary: 291 mg/dL — ABNORMAL HIGH (ref 70–99)
Glucose-Capillary: 314 mg/dL — ABNORMAL HIGH (ref 70–99)
Glucose-Capillary: 346 mg/dL — ABNORMAL HIGH (ref 70–99)
Glucose-Capillary: 276 mg/dL — ABNORMAL HIGH (ref 70–99)

## 2018-11-08 LAB — BASIC METABOLIC PANEL
Anion gap: 12 (ref 5–15)
BUN: 9 mg/dL (ref 6–20)
CO2: 24 mmol/L (ref 22–32)
Calcium: 8.8 mg/dL — ABNORMAL LOW (ref 8.9–10.3)
Chloride: 99 mmol/L (ref 98–111)
Creatinine, Ser: 1.06 mg/dL — ABNORMAL HIGH (ref 0.44–1.00)
GFR calc Af Amer: >60 mL/min (ref >60)
GFR calc non Af Amer: >60 mL/min (ref >60)
Glucose, Bld: 348 mg/dL — ABNORMAL HIGH (ref 70–99)
Potassium: 5 mmol/L (ref 3.5–5.1)
Sodium: 135 mmol/L (ref 135–145)

## 2018-11-08 LAB — MAGNESIUM: Magnesium: 1.8 mg/dL (ref 1.7–2.4)

## 2018-11-08 MED ORDER — INSULIN GLARGINE 100 UNIT/ML ~~LOC~~ SOLN
12.0000 [IU] | Freq: Two times a day (BID) | SUBCUTANEOUS | Status: DC
  Administered 2018-11-09 – 2018-11-11 (×5): 12 [IU] via SUBCUTANEOUS
  Filled 2018-11-08 (×7): qty 0.12

## 2018-11-08 MED ORDER — INSULIN ASPART 100 UNIT/ML ~~LOC~~ SOLN: 0.0000 [IU] | Freq: Every day | SUBCUTANEOUS | Status: DC

## 2018-11-08 MED ORDER — INSULIN ASPART 100 UNIT/ML ~~LOC~~ SOLN
0.0000 [IU] | Freq: Three times a day (TID) | SUBCUTANEOUS | Status: DC
  Administered 2018-11-09: 8 [IU] via SUBCUTANEOUS
  Administered 2018-11-09: 3 [IU] via SUBCUTANEOUS
  Administered 2018-11-10: 15 [IU] via SUBCUTANEOUS
  Administered 2018-11-10: 8 [IU] via SUBCUTANEOUS
  Administered 2018-11-11: 11 [IU] via SUBCUTANEOUS
  Administered 2018-11-11: 07:00:00 3 [IU] via SUBCUTANEOUS

## 2018-11-08 NOTE — Progress Notes (Addendum)
STROKE TEAM PROGRESS NOTE   SUBJECTIVE (INTERVAL HISTORY) Pt resting comfortably in bed. She tells me she wants to sleep in this am. She is medically stable and ready for d/c. I have d/w case mgt and SNF referrals have  Been sent out, none have accepted yet.   OBJECTIVE Vitals:   11/07/18 2116 11/07/18 2334 11/08/18 0417 11/08/18 0856  BP: (!) 170/81 140/69 137/73   Pulse: 77 74 69   Resp:  15 14   Temp:  98.6 F (37 C) 98.4 F (36.9 C) 97.6 F (36.4 C)  TempSrc:  Oral Oral Axillary  SpO2:  97% 97%   Weight:      Height:        CBC:  Recent Labs  Lab 11/04/18 0555 11/06/18 1308 11/08/18 0718  WBC 7.2 6.8 6.2  NEUTROABS 5.4  --   --   HGB 11.2* 10.9* 11.3*  HCT 34.7* 33.0* 33.3*  MCV 92.3 92.2 91.5  PLT 198 196 169    Basic Metabolic Panel:  Recent Labs  Lab 11/06/18 1308 11/08/18 0718  NA 139 135  K 3.3* 5.0  CL 101 99  CO2 27 24  GLUCOSE 225* 348*  BUN 10 9  CREATININE 1.23* 1.06*  CALCIUM 8.9 8.8*  MG  --  1.8    IMAGING past 24h No results found.    PHYSICAL EXAM    General - obese middle-aged Caucasian lady, not in distress.  Ophthalmologic - fundi not visualized due to noncooperation. Cardiovascular - Regular rhythm, not in A. Fib.  Neuro - Patient is asleep when I enter, she easily awakens to voice and says she wants to sleep-in today. She is able to speak simple sentences and following simple commands, but not quite cooperative on exam due to hard of hearing/sleepy. Right ocular prosthesis, with ptosis.  Normal movements in the left eye.  No significant facial asymmetry, tongue midline in mouth.  Moving all extremities symmetrical in bed.  DTR not done, Sensation subjectively symmetrical and coordination no ataxia bilaterally by observation. Did not assess gait  ASSESSMENT/PLAN Tina [name] is a 53 y.o. female with history of type I diabetes, hypertension, paroxysmal A. fib on Coumadin, Turner syndrome and legal blindness presenting with  AMS, mildly hypertensive and combative. She did not receive IV t-PA due to hemorrhage.  Right thalamic hemorrhage with intraventricular extension, likely hypertensive in the setting of Coumadin coagulopathy s/p reversal  CT head - Right thalamic hemorrhage with left IVH.   MRI head - unchanged ICH centered in the right thalamus with intraventricular extension.   CTA Head - Size stable right thalamic and intraventricular clot with dilatation of the left temporal horn. Small remote left thalamic and left cerebellar infarcts.  No aneurysm, or AVM  Repeat CT 4/7 unchanged R thalamic and IVH w/ temporal horm dilation  CT repeat 4/9 slight interval decrease R thalamic hmg. Slight decrease caliber of ventricles.   2D Echo - 06/12/2018 - EF 60 - 65%.   EEG - posterior background slowing. No seizure   LDL 65   HgbA1c - 6.6  UDS - positive for benzos   VTE prophylaxis - Heparin subcu  warfarin daily prior to admission, INR 2.6, reversed with vitamin K and Kcentra.  Now on No antithrombotic given hmg  NSG consulted, no intervention needed   Therapy recommendations:  SNF  Disposition:  Pending   DKA with type 1 diabetes  HgbA1c 5.7, at goal < 7.0  On insulin IV in the  ICU, now on Lantus  CBG monitoring - elvated past 24h with increased intake   Lantus increase to 7 units bid   SSI 0-9  PAF  in NSR  On Coumadin at home  INR 2.6 on admission, therapeutic -> reversed with Kcentra and vitamin K  INR now 1.1  Rate controlled  Hold off antithrombotic for now due to [place]  AKI with resolved hypernatremia  AKI - creatinine - 1.28->1.14->0.87->1.23-1.06  Na 138->139->135  Encourage p.o. intake  Agitation/encephalopathy  Calmer and less agitated  off precedex, amantadine and depakote   avoid benzos/opiates.   Pt limitations are likely personality driven as well.   Continue to observe  Hypertension  On the higher end   Treated with Cleviprex in ICU, now  off . SBP goal < 160 mmHg  . Home meds:  cardizem 240, cozaar 50 bid . On cardizem 240, cozaar 50 bid and metoprolol 25 bid . Long-term BP goal normotensive   Hyperlipidemia  Lipid lowering medication PTA: Crestor 20 mg daily  LDL 65  resumed Crestor 20  Continue statin at discharge  OSA, apnea, desaturations  As per husband, patient had OSA evaluation 1 year ago, no need CPAP at home  Intermittent desaturation during sleep on this admission  On and off CPAP throughout hospitalization, depending on agitation level  Ongoing refusal of CPAP  Dysphagia, resolved  Secondary to stroke  On D3 thin liquids  Encourage p.o. intake  Other Stroke Risk Factors  Former cigarette smoker - quit 30 years ago  ETOH use, advised to drink no more than 1 alcoholic beverage per day.  Obesity, Body mass index is 31.3 kg/m., recommend weight loss, diet and exercise as appropriate   Previous strokes by imaging - Small remote left thalamic and left cerebellar infarcts.  Other Active Problems  INR - 2.6 on admission - reversed -> 1.1  Anemia of chronic disease- 11.4->10.3->11.0->11.2->10.9->11.3  Leukocytosis, resolved 11.9->10.0->9.8->7.2->6.8  Hypokalemia - 3.3->3.3 - supplement ->5.0  Mg low 1.8  Hospital day # 21   Tina Hawking, MD PhD Stroke Neurology 11/08/2018 11:49 PM  To contact Stroke Continuity provider, please refer to [email or web address] After hours, contact General Neurology

## 2018-11-08 NOTE — Progress Notes (Signed)
Physical Therapy Treatment Patient Details Name: Ireene Ballowe MRN: [id number] DOB: 10/05/1965 Today's Date: 11/08/2018    History of Present Illness 53 year old female admitted 10/18/2018 with AMS.  Head CT = right thalamic hemorrhage with intraventricular extension. Most of the blood is in the left lateral ventricle. PMHx: DM, HTN, Paroxysmal A Fib, Turner syndrome, legal blindness.    PT Comments    Pt progressing towards physical therapy goals. Pt finishing up working with SLP when PT arrived. Overall pt alert and engaged with activity in room. She was able to participate in ~20 minutes of continuous functional activity including 550 feet of ambulation distance. This is the longest stretch of time that pt has been engaged in a therapy session to date. SNF remains appropriate given current functional and cognitive deficits. Will continue to follow and progress as able per POC.    Follow Up Recommendations  SNF;Supervision/Assistance - 24 hour     Equipment Recommendations  Rolling walker with 5" wheels    Recommendations for Other Services Rehab consult     Precautions / Restrictions Precautions Precautions: Fall Restrictions Weight Bearing Restrictions: No    Mobility  Bed Mobility Overal bed mobility: Needs Assistance Bed Mobility: Supine to Sit     Supine to sit: Min assist     General bed mobility comments: Pt was able to transition to EOB with min assist for trunk support. Pt initiating with multimodal cues.   Transfers Overall transfer level: Needs assistance Equipment used: Rolling walker (2 wheeled);None Transfers: Sit to/from Stand Sit to Stand: Min guard         General transfer comment: Close guard for safety as pt powered up to full stand. Stood to [employer] initially and then without AD in the bathroom.   Ambulation/Gait Ambulation/Gait assistance: +2 safety/equipment;Min assist;Mod assist Gait Distance (Feet): 550 Feet Assistive device: Rolling  walker (2 wheeled) Gait Pattern/deviations: Step-through pattern;Decreased stride length;Wide base of support Gait velocity: decreased Gait velocity interpretation: 1.31 - 2.62 ft/sec, indicative of limited community ambulator General Gait Details: Initially pt ambulating with min guard assist (occasional assist for walker management due to low vision but balance was fairly good). As pt fatigued, she required up to mod assist for balance support, and multimodal cues for upright posture. Second person helpful to manage lines during this time.    Stairs             Wheelchair Mobility    Modified Rankin (Stroke Patients Only) Modified Rankin (Stroke Patients Only) Pre-Morbid Rankin Score: No symptoms Modified Rankin: Moderately severe disability     Balance Overall balance assessment: Needs assistance Sitting-balance support: No upper extremity supported;Feet supported Sitting balance-Leahy Scale: Fair Sitting balance - Comments: sitting edge of recliner, pt able to cross right leg over left leg and don/doff sock with supervision only for balance.  Postural control: Posterior lean Standing balance support: Bilateral upper extremity supported Standing balance-Leahy Scale: Fair Standing balance comment: statically for short bouts and then leans on the counter or walker for support.                            Cognition Arousal/Alertness: Awake/alert(initially lethargic but easily aroused today) Behavior During Therapy: Flat affect;Impulsive Overall Cognitive Status: Impaired/Different from baseline Area of Impairment: Following commands;Attention;Memory;Problem solving;Awareness;Safety/judgement;Orientation                 Orientation Level: Disoriented to;Place;Time;Situation Current Attention Level: Focused;Sustained(during familiar functional ADL tasks) Memory: Decreased  short-term memory Following Commands: Follows one step commands inconsistently;Follows  one step commands with increased time Safety/Judgement: Decreased awareness of safety;Decreased awareness of deficits Awareness: Intellectual Problem Solving: Slow processing;Decreased initiation;Requires verbal cues General Comments: Increased engagement in session today      Exercises      General Comments        Pertinent Vitals/Pain Pain Assessment: No/denies pain Pain Score: 0-No pain Pain Intervention(s): Monitored during session    Home Living                      Prior Function            PT Goals (current goals can now be found in the care plan section) Acute Rehab PT Goals Patient Stated Goal: Pt asking to use the bathroom this session PT Goal Formulation: Patient unable to participate in goal setting Time For Goal Achievement: 11/21/18 Potential to Achieve Goals: Good Progress towards PT goals: Progressing toward goals    Frequency    Min 4X/week      PT Plan Current plan remains appropriate    Co-evaluation PT/OT/SLP Co-Evaluation/Treatment: Yes Reason for Co-Treatment: For patient/therapist safety;To address functional/ADL transfers;Necessary to address cognition/behavior during functional activity PT goals addressed during session: Mobility/safety with mobility;Balance;Proper use of DME        AM-PAC PT "6 Clicks" Mobility   Outcome Measure  Help needed turning from your back to your side while in a flat bed without using bedrails?: A Little Help needed moving from lying on your back to sitting on the side of a flat bed without using bedrails?: A Lot Help needed moving to and from a bed to a chair (including a wheelchair)?: A Lot Help needed standing up from a chair using your arms (e.g., wheelchair or bedside chair)?: A Lot Help needed to walk in hospital room?: A Lot Help needed climbing 3-5 steps with a railing? : Total 6 Click Score: 12    End of Session Equipment Utilized During Treatment: Gait belt Activity Tolerance:  Patient tolerated treatment well Patient left: in chair;with call bell/phone within reach;with chair alarm set Nurse Communication: Mobility status PT Visit Diagnosis: Other symptoms and signs involving the nervous system (R29.898);Other abnormalities of gait and mobility (R26.89)     Time: 9390-3009 PT Time Calculation (min) (ACUTE ONLY): 30 min  Charges:  $Gait Training: 8-22 mins                     [name], PT, DPT Acute Rehabilitation Services Pager: [phone] Office: [phone]    [name] 11/08/2018, 2:53 PM

## 2018-11-08 NOTE — TOC Progression Note (Signed)
Transition of Care [facility]) - Progression Note    Patient Details  Name: Tina [name] MRN: [id number] Date of Birth: 1965/07/23  Transition of Care [facility]) CM/SW [place], [place] Phone Number: 11/08/2018, 2:31 PM  Clinical Narrative:  CSW continuing to search for bed for patient. CSW reached out to [facility], they declined. Patient has been accepted at [facility] in [place]. CSW reinitiated insurance authorization through [employer] Awaiting authorization to admit to SNF. Patient continuing to require +2 assist for ambulation, per PT note from yesterday, and not fully back to baseline orientation.     Expected Discharge Plan: Skilled Nursing Facility Barriers to Discharge: Ship broker, Continued Medical Work up  Expected Discharge Plan and Services Expected Discharge Plan: [place] Choice: [place] arrangements for the past 2 months: Single Family Home                                       Social Determinants of Health (SDOH) Interventions    Readmission Risk Interventions No flowsheet data found.

## 2018-11-08 NOTE — Progress Notes (Signed)
  Speech Language Pathology Treatment: Cognitive-Linquistic  Patient Details Name: Tina [name] MRN: [id number] DOB: 1965/12/14 Today's Date: 11/08/2018 Time: 9233-0076 SLP Time Calculation (min) (ACUTE ONLY): 16 min  Assessment / Plan / Recommendation Clinical Impression  Pt was seen for cognitive-linguistic session and her ability to participate in the session was even more improved than was seen during the last session. She required max cues for sequencing of ADL tasks which she reported were familiar to her but reasoning was improved and less support was needed. She demonstrated some difficulty with word retireval during sequencing tasks and achieved 60% accuracy with responsive naming increasing to 100% with mod cues. Reduced support was needed to problem solving tasks related to safety. SLP will continue to follow pt.    HPI HPI: 53 year old female admitted 10/18/2018 with AMS. PMH: DM, HTN, Paroxysmal A Fib, Turner syndrome, legal blindness. Head CT = right thalamic hemorrhage with intraventricular extension. Most of the blood is in the left lateral ventricle.      SLP Plan  Continue with current plan of care       Recommendations                   Oral Care Recommendations: Oral care BID Follow up Recommendations: 24 hour supervision/assistance;Skilled Nursing facility SLP Visit Diagnosis: Cognitive communication deficit (A26.333) Plan: Continue with current plan of care       Bria Sparr I. Hardin Negus, [place], [place] Office number [phone] Pager [place] 11/08/2018, 11:59 AM

## 2018-11-08 NOTE — Progress Notes (Signed)
Inpatient Diabetes Program Recommendations  AACE/ADA: New Consensus Statement on Inpatient Glycemic Control (2015)  Target Ranges:  Prepandial:   less than 140 mg/dL      Peak postprandial:   less than 180 mg/dL (1-2 hours)      Critically ill patients:  140 - 180 mg/dL   Lab Results  Component Value Date   GLUCAP 291 (H) 11/08/2018   HGBA1C 6.6 (H) 10/22/2018    Review of Glycemic Control Results for Tina [name], Tina [name] (MRN [id number]) as of 11/08/2018 09:56  Ref. Range 11/06/2018 21:33 11/07/2018 06:22 11/07/2018 11:38 11/07/2018 20:57 11/08/2018 06:14  Glucose-Capillary Latest Ref Range: 70 - 99 mg/dL 191 (H) 167 (H) 220 (H) 237 (H) 291 (H)   Diabetes history: DM1  Inpatient Diabetes Program Recommendations:  Noted increased CBGs with drinking supplements. -Novolog 3 units tid meal coverage if eats 50% or drinks supplement  Thank you, Nani Gasser. Alexes Lamarque, RN, MSN, CDE  Diabetes Coordinator Inpatient Glycemic Control Team Team Pager [phone] (8am-5pm) 11/08/2018 9:59 AM

## 2018-11-08 NOTE — Progress Notes (Signed)
Occupational Therapy Treatment Patient Details Name: Tina [name] MRN: [id number] DOB: 1965-12-10 Today's Date: 11/08/2018    History of present illness 53 year old female admitted 10/18/2018 with AMS.  Head CT = right thalamic hemorrhage with intraventricular extension. Most of the blood is in the left lateral ventricle. PMHx: DM, HTN, Paroxysmal A Fib, Turner syndrome, legal blindness.   OT comments  Pt progressing towards OT goals with significant activity tolerance increase (approx 20 min continuous). Pt was able to ambulate to bathroom, perform sink level grooming, and hallway ambulation. Pt with very little fatigue awareness and continues to demonstrate cognitive deficits. OT will continue to follow acutely and Pt continues to require skilled OT post-acute at SNF level.    Follow Up Recommendations  SNF;Supervision/Assistance - 24 hour    Equipment Recommendations  Other (comment)(defer to next venue)    Recommendations for Other Services      Precautions / Restrictions Precautions Precautions: Fall Restrictions Weight Bearing Restrictions: No       Mobility Bed Mobility Overal bed mobility: Needs Assistance Bed Mobility: Supine to Sit     Supine to sit: Min assist     General bed mobility comments: Pt was able to transition to EOB with min assist for trunk support. Pt initiating with multimodal cues.   Transfers Overall transfer level: Needs assistance Equipment used: Rolling walker (2 wheeled);None Transfers: Sit to/from Stand Sit to Stand: Min guard         General transfer comment: Close guard for safety as pt powered up to full stand. Stood to [employer] initially and then without AD in the bathroom.     Balance Overall balance assessment: Needs assistance Sitting-balance support: No upper extremity supported;Feet supported Sitting balance-Leahy Scale: Fair Sitting balance - Comments: sitting edge of recliner, pt able to cross right leg over left leg and  don/doff sock with supervision only for balance.  Postural control: Posterior lean Standing balance support: Bilateral upper extremity supported Standing balance-Leahy Scale: Fair Standing balance comment: statically for short bouts and then leans on the counter or walker for support.                           ADL either performed or assessed with clinical judgement   ADL Overall ADL's : Needs assistance/impaired     Grooming: Wash/dry hands;Wash/dry face;Oral care;Minimal assistance;Standing;Cueing for safety;Cueing for sequencing Grooming Details (indicate cue type and reason): supervision and cues to initiate activities however then was able to complete today                 Toilet Transfer: Moderate assistance;Ambulation;RW;Cueing for sequencing Toilet Transfer Details (indicate cue type and reason): cues for safety with RW Toileting- Clothing Manipulation and Hygiene: Minimal assistance;Sit to/from stand       Functional mobility during ADLs: Moderate assistance;+2 for safety/equipment;Cueing for safety;Cueing for sequencing;Rolling walker General ADL Comments: more alert throughout today's session. Fatigues with little awareness and needs lots of help managing RW - cues for baseline visual deficits     Vision       Perception     Praxis      Cognition Arousal/Alertness: Awake/alert(initially lethargic but easily aroused today) Behavior During Therapy: Flat affect;Impulsive Overall Cognitive Status: Impaired/Different from baseline Area of Impairment: Following commands;Attention;Memory;Problem solving;Awareness;Safety/judgement;Orientation                 Orientation Level: Disoriented to;Place;Time;Situation Current Attention Level: Focused;Sustained(during familiar functional ADL tasks) Memory: Decreased short-term memory  Following Commands: Follows one step commands inconsistently;Follows one step commands with increased  time Safety/Judgement: Decreased awareness of safety;Decreased awareness of deficits Awareness: Intellectual Problem Solving: Slow processing;Decreased initiation;Requires verbal cues General Comments: Increased engagement in session today        Exercises     Shoulder Instructions       General Comments VSS    Pertinent Vitals/ Pain       Pain Assessment: No/denies pain Pain Score: 0-No pain Faces Pain Scale: No hurt Pain Intervention(s): Monitored during session  Home Living                                          Prior Functioning/Environment              Frequency  Min 2X/week        Progress Toward Goals  OT Goals(current goals can now be found in the care plan section)  Progress towards OT goals: Progressing toward goals  Acute Rehab OT Goals Patient Stated Goal: Pt asking to use the bathroom this session OT Goal Formulation: With patient Time For Goal Achievement: 11/20/18 Potential to Achieve Goals: Good  Plan Discharge plan remains appropriate;Frequency remains appropriate    Co-evaluation    PT/OT/SLP Co-Evaluation/Treatment: Yes Reason for Co-Treatment: Necessary to address cognition/behavior during functional activity;For patient/therapist safety;To address functional/ADL transfers PT goals addressed during session: Mobility/safety with mobility;Balance;Proper use of DME;Strengthening/ROM OT goals addressed during session: ADL's and self-care;Proper use of Adaptive equipment and DME      AM-PAC OT "6 Clicks" Daily Activity     Outcome Measure   Help from another person eating meals?: A Lot Help from another person taking care of personal grooming?: A Little Help from another person toileting, which includes using toliet, bedpan, or urinal?: A Lot Help from another person bathing (including washing, rinsing, drying)?: A Lot Help from another person to put on and taking off regular upper body clothing?: A Lot Help from  another person to put on and taking off regular lower body clothing?: A Lot 6 Click Score: 13    End of Session Equipment Utilized During Treatment: Gait belt;Rolling walker  OT Visit Diagnosis: Unsteadiness on feet (R26.81);Other abnormalities of gait and mobility (R26.89);Muscle weakness (generalized) (M62.81);Other symptoms and signs involving cognitive function   Activity Tolerance Patient tolerated treatment well   Patient Left in chair;with call bell/phone within reach;with chair alarm set   Nurse Communication Mobility status(needs assist for feeding)        Time: 6808-8110 OT Time Calculation (min): 26 min  Charges: OT General Charges $OT Visit: 1 Visit OT Treatments $Self Care/Home Management : 8-22 mins  Hulda Humphrey OTR/L Acute Rehabilitation Services Pager: [phone] Office: [place] 11/08/2018, 3:26 PM

## 2018-11-09 DIAGNOSIS — I610 Nontraumatic intracerebral hemorrhage in hemisphere, subcortical: Secondary | ICD-10-CM

## 2018-11-09 LAB — GLUCOSE, CAPILLARY
Glucose-Capillary: 116 mg/dL — ABNORMAL HIGH (ref 70–99)
Glucose-Capillary: 140 mg/dL — ABNORMAL HIGH (ref 70–99)

## 2018-11-09 MED ORDER — ADULT MULTIVITAMIN W/MINERALS CH: 1.0000 | ORAL_TABLET | Freq: Every day | ORAL | Status: DC

## 2018-11-09 MED ORDER — WHITE PETROLATUM EX OINT
TOPICAL_OINTMENT | CUTANEOUS | Status: AC
  Administered 2018-11-09: 1
  Filled 2018-11-09: qty 28.35

## 2018-11-09 MED ORDER — SENNOSIDES-DOCUSATE SODIUM 8.6-50 MG PO TABS: 1.0000 | ORAL_TABLET | Freq: Two times a day (BID) | ORAL | Status: DC

## 2018-11-09 MED ORDER — INSULIN GLARGINE 100 UNIT/ML ~~LOC~~ SOLN: 12.0000 [IU] | Freq: Two times a day (BID) | SUBCUTANEOUS | 11 refills | Status: DC

## 2018-11-09 MED ORDER — ENSURE ENLIVE PO LIQD: 237.0000 mL | Freq: Three times a day (TID) | ORAL | 12 refills | Status: DC

## 2018-11-09 MED ORDER — ORAL CARE MOUTH RINSE: 15.0000 mL | Freq: Two times a day (BID) | OROMUCOSAL | 0 refills | Status: DC

## 2018-11-09 MED ORDER — HALOPERIDOL LACTATE 5 MG/ML IJ SOLN: 2.0000 mg | Freq: Four times a day (QID) | INTRAMUSCULAR | Status: DC | PRN

## 2018-11-09 MED ORDER — LOSARTAN POTASSIUM 50 MG PO TABS: 50.0000 mg | ORAL_TABLET | Freq: Two times a day (BID) | ORAL | Status: AC

## 2018-11-09 MED ORDER — SODIUM CHLORIDE 0.9 % IV SOLN
INTRAVENOUS | Status: DC
  Administered 2018-11-09 – 2018-11-10 (×3): via INTRAVENOUS

## 2018-11-09 MED ORDER — METOPROLOL TARTRATE 25 MG PO TABS: 25.0000 mg | ORAL_TABLET | Freq: Two times a day (BID) | ORAL | Status: AC

## 2018-11-09 MED ORDER — HEPARIN SODIUM (PORCINE) 5000 UNIT/ML IJ SOLN: 5000.0000 [IU] | Freq: Three times a day (TID) | INTRAMUSCULAR | Status: DC

## 2018-11-09 MED ORDER — ACETAMINOPHEN 325 MG PO TABS: 650.0000 mg | ORAL_TABLET | ORAL | Status: AC | PRN

## 2018-11-09 NOTE — Progress Notes (Signed)
[employer] has given insurance authorization for 7 days for the patient. The insurance authorization number is [id number].   CSW will continue to assist with disposition for the patient.   Domenic Schwab, MSW, [place]

## 2018-11-09 NOTE — Progress Notes (Signed)
Patient refused CPAP machine will continue to monitor pt.

## 2018-11-09 NOTE — Plan of Care (Signed)
Social Worker reports that [facility] wants Covid 19 test prior to admission. Our protocol specifies test to be sent to outside lab for SNF pre placement. Discharge will now need to be delayed until results are back.  Mikey Bussing PA-C [facility] Pager [phone] 11/09/2018, 3:48 PM

## 2018-11-09 NOTE — Progress Notes (Signed)
STROKE TEAM PROGRESS NOTE   SUBJECTIVE (INTERVAL HISTORY) She is medically stable and ready for d/c.  SNF referrals have  Been sent out, none have accepted yet.   OBJECTIVE Vitals:   11/08/18 2036 11/08/18 2346 11/09/18 0430 11/09/18 0759  BP: (!) 125/95 136/61 135/80 (!) 155/70  Pulse: 73 69 75 76  Resp: 17 17 17 18   Temp: 99.4 F (37.4 C) 98 F (36.7 C) 98.1 F (36.7 C) 98.1 F (36.7 C)  TempSrc: Oral Oral Oral Oral  SpO2: 98% 97% 98% 100%  Weight:      Height:        CBC:  Recent Labs  Lab 11/04/18 0555 11/06/18 1308 11/08/18 0718  WBC 7.2 6.8 6.2  NEUTROABS 5.4  --   --   HGB 11.2* 10.9* 11.3*  HCT 34.7* 33.0* 33.3*  MCV 92.3 92.2 91.5  PLT 198 196 962    Basic Metabolic Panel:  Recent Labs  Lab 11/06/18 1308 11/08/18 0718  NA 139 135  K 3.3* 5.0  CL 101 99  CO2 27 24  GLUCOSE 225* 348*  BUN 10 9  CREATININE 1.23* 1.06*  CALCIUM 8.9 8.8*  MG  --  1.8    IMAGING past 24h No results found.    PHYSICAL EXAM    General - obese middle-aged Caucasian lady, not in distress.  Ophthalmologic - fundi not visualized due to noncooperation. Cardiovascular - Regular rhythm, not in A. Fib.  Neuro - Patient is asleep when I enter, she easily awakens to voice and says she wants to sleep-in today. She is able to speak simple sentences and following simple commands, but not quite cooperative on exam due to hard of hearing/sleepy. Right ocular prosthesis, with ptosis.  Normal movements in the left eye.  No significant facial asymmetry, tongue midline in mouth.  Moving all extremities symmetrical in bed.  DTR not done, Sensation subjectively symmetrical and coordination no ataxia bilaterally by observation. Did not assess gait  ASSESSMENT/PLAN [name] is a 53 y.o. female with history of type I diabetes, hypertension, paroxysmal A. fib on Coumadin, Turner syndrome and legal blindness presenting with AMS, mildly hypertensive and combative. She did not  receive IV t-PA due to hemorrhage.  Right thalamic hemorrhage with intraventricular extension, likely hypertensive in the setting of Coumadin coagulopathy s/p reversal  CT head - Right thalamic hemorrhage with left IVH.   MRI head - unchanged ICH centered in the right thalamus with intraventricular extension.   CTA Head - Size stable right thalamic and intraventricular clot with dilatation of the left temporal horn. Small remote left thalamic and left cerebellar infarcts.  No aneurysm, or AVM  Repeat CT 4/7 unchanged R thalamic and IVH w/ temporal horm dilation  CT repeat 4/9 slight interval decrease R thalamic hmg. Slight decrease caliber of ventricles.   2D Echo - 06/12/2018 - EF 60 - 65%.   EEG - posterior background slowing. No seizure   LDL 65   HgbA1c - 6.6  UDS - positive for benzos   VTE prophylaxis - Heparin subcu  warfarin daily prior to admission, INR 2.6, reversed with vitamin K and Kcentra.  Now on No antithrombotic given hmg  NSG consulted, no intervention needed   Therapy recommendations:  SNF  Disposition:  Pending   DKA with type 1 diabetes  HgbA1c 5.7, at goal < 7.0  On insulin IV in the ICU, now on Lantus  CBG monitoring - elvated past 24h with increased intake  Lantus increase to 7 units bid   SSI 0-9  PAF  In NSR  On Coumadin at home  INR 2.6 on admission, therapeutic -> reversed with Kcentra and vitamin K  INR now 1.1  Rate controlled  Hold off antithrombotic for now due to [place]  AKI with resolved hypernatremia  AKI - creatinine - 1.28->1.14->0.87->1.23-1.06  Na 138->139->135  Encourage p.o. intake  Agitation/encephalopathy  Calmer and less agitated  off precedex, amantadine and depakote   avoid benzos/opiates.   Pt limitations are likely personality driven as well.   Continue to observe  Hypertension  On the higher end   Treated with Cleviprex in ICU, now off . SBP goal < 160 mmHg  . Home meds:  cardizem 240,  cozaar 50 bid . On cardizem 240, cozaar 50 bid and metoprolol 25 bid . Long-term BP goal normotensive   Hyperlipidemia  Lipid lowering medication PTA: Crestor 20 mg daily  LDL 65  Resumed Crestor 20  Continue statin at discharge  OSA, apnea, desaturations  As per husband, patient had OSA evaluation 1 year ago, no need CPAP at home  Intermittent desaturation during sleep on this admission  On and off CPAP throughout hospitalization, depending on agitation level  Ongoing refusal of CPAP  Dysphagia, resolved  Secondary to stroke  On D3 thin liquids  Encourage p.o. intake  Other Stroke Risk Factors  Former cigarette smoker - quit 30 years ago  ETOH use, advised to drink no more than 1 alcoholic beverage per day.  Obesity, Body mass index is 31.3 kg/m., recommend weight loss, diet and exercise as appropriate   Previous strokes by imaging - Small remote left thalamic and left cerebellar infarcts.  Other Active Problems  INR - 2.6 on admission - reversed -> 1.1  Anemia of chronic disease- 11.4->10.3->11.0->11.2->10.9->11.3  Leukocytosis, resolved 11.9->10.0->9.8->7.2->6.8  Hypokalemia - 3.3->3.3 - supplement ->5.0  Mg low 1.8   PLAN  SW reports [employer] has given insurance authorization for 7 days for the patient. Awaiting word on disposition.  Hospital day # 97   Personally examined patient and images, and have participated in and made any corrections needed to history, physical, neuro exam,assessment and plan as stated above.  I have personally obtained the history, evaluated lab date, reviewed imaging studies and agree with radiology interpretations.    Sarina Ill, MD Stroke Neurology   A total of 15 minutes was spent for the care of this patient, spent on counseling patient and family on different diagnostic and therapeutic options, counseling and coordination of care, riskd ans benefits of management, compliance, or risk factor  reduction and education.    To contact Stroke Continuity provider, please refer to [email or web address] After hours, contact General Neurology

## 2018-11-09 NOTE — TOC Progression Note (Signed)
Transition of Care [facility]) - Progression Note    Patient Details  Name: Tina [name] MRN: [id number] Date of Birth: 03/02/1966  Transition of Care [facility]) CM/SW [place], [place] Phone Number: 11/09/2018, 3:51 PM  Clinical Narrative:     CSW was working the patient up for discharge when Gerald Stabs from [facility] called and stated that the patient will require a COVID-19 screen for her blood clots. CSW called PA and they ordered the test but will take a few days to come back.   [facility] can take once COVID screen comes back negative.   Expected Discharge Plan: [place] Barriers to Discharge: No Barriers Identified  Expected Discharge Plan and Services Expected Discharge Plan: [place] Choice: [place] Living arrangements for the past 2 months: Single Family Home                 DME Arranged: N/A DME Agency: NA       HH Arranged: NA HH Agency: NA         Social Determinants of Health (SDOH) Interventions    Readmission Risk Interventions No flowsheet data found.

## 2018-11-09 NOTE — NC FL2 (Signed)
[place] MEDICAID FL2 LEVEL OF CARE SCREENING TOOL     IDENTIFICATION  Patient Name: Tina [name] Birthdate: [birth year] Sex: female Admission Date (Current Location): 10/18/2018  [facility] and [place] Number:  Herbalist and Address:  [facility] [place], [place] [address] [place], [place] [address]      Provider Number: [id number]  Attending Physician Name and Address:  [name], MD  Relative Name and Phone Number:  [name] (spouse) [phone]    Current Level of Care: Hospital Recommended Level of Care: [place] Prior Approval Number:    Date Approved/Denied:   PASRR Number: [id number]  Discharge Plan: SNF    Current Diagnoses: Patient Active Problem List   Diagnosis Date Noted  . Hyperlipidemia 11/01/2018  . AKI (acute kidney injury) ([place]) 11/01/2018  . OSA (obstructive sleep apnea) 11/01/2018  . Turner syndrome 11/01/2018  . Intracerebral hemorrhage 10/20/2018  . Intracranial hemorrhage ([place]) 10/18/2018  . Encounter for therapeutic drug monitoring 06/19/2018  . Atrial fibrillation ([place]) 06/19/2018  . Atrial fibrillation with RVR ([place]) 06/11/2018  . IDDM (insulin dependent diabetes mellitus) ([place]) 11/17/2013  . Hypersomnolence 09/18/2013  . Chronic cough 09/18/2013  . Abnormal transaminases 06/04/2013  . Abnormal alkaline phosphatase test 06/04/2013  . PAC (premature atrial contraction) 09/14/2011  . Reflux 08/26/2011  . Hyperglycemia 08/24/2011  . History of retinal detachment 08/24/2011  . Vitreous hemorrhage ([place]) 08/24/2011  . Secondary DM with DKA, uncontrolled ([place]) 08/24/2011  . UTI (lower urinary tract infection) 08/24/2011  . HTN (hypertension) 08/24/2011  . Palpitations 10/25/2010    Orientation RESPIRATION BLADDER Height & Weight     (disoriented X4)  O2, Other (Comment)(HFNC 5-6L , CPAP (from home) ) Incontinent, External catheter Weight: 144 lb 10 oz (65.6 kg) Height:  4\' 9"  (144.8 cm)   BEHAVIORAL SYMPTOMS/MOOD NEUROLOGICAL BOWEL NUTRITION STATUS      Incontinent Diet(puree diet, thin liquids via straw when patient is awake and upright position, medications crushed with puree and full supervision)  AMBULATORY STATUS COMMUNICATION OF NEEDS Skin   Extensive Assist Verbally Normal                       Personal Care Assistance Level of Assistance  Bathing, Feeding, Dressing, Total care Bathing Assistance: Maximum assistance Feeding assistance: Maximum assistance Dressing Assistance: Maximum assistance Total Care Assistance: Maximum assistance   Functional Limitations Info  Sight, Hearing, Speech Sight Info: Impaired(legally blind in right eye) Hearing Info: Adequate Speech Info: Impaired(slurred)    SPECIAL CARE FACTORS FREQUENCY  PT (By licensed PT), OT (By licensed OT)     PT Frequency: min 5x weekly OT Frequency: min 5x weekly            Contractures Contractures Info: Not present    Additional Factors Info  Code Status, Allergies Code Status Info: full Allergies Info: Acetazolamide, Ace Inhibitors           Current Medications (11/09/2018):  This is the current hospital active medication list Current Facility-Administered Medications  Medication Dose Route Frequency Provider Last Rate Last Dose  . 0.9 %  sodium chloride infusion   Intravenous Continuous [name], MD 50 mL/hr at 11/07/18 2126    . acetaminophen (TYLENOL) tablet 650 mg  650 mg Oral Q4H PRN Donzetta Starch, NP   650 mg at 10/26/18 1826   Or  . acetaminophen (TYLENOL) solution 650 mg  650 mg Per Tube Q4H PRN Donzetta Starch, NP  Or  . acetaminophen (TYLENOL) suppository 650 mg  650 mg Rectal Q4H PRN Donzetta Starch, NP   650 mg at 10/29/18 0424  . chlorhexidine (PERIDEX) 0.12 % solution 15 mL  15 mL Mouth Rinse BID [place] Sabin L, NP   15 mL at 11/09/18 1034  . diltiazem (CARDIZEM CD) 24 hr capsule 240 mg  240 mg Oral Daily Burnetta Sabin L, NP   240 mg at 11/09/18 1035  .  feeding supplement (ENSURE ENLIVE) (ENSURE ENLIVE) liquid 237 mL  237 mL Oral TID BM [name], MD   237 mL at 11/09/18 1036  . haloperidol lactate (HALDOL) injection 2 mg  2 mg Intravenous Q8H PRN Donzetta Starch, NP   2 mg at 10/25/18 0516  . heparin injection 5,000 Units  5,000 Units Subcutaneous Q8H Burnetta Sabin L, NP   5,000 Units at 11/09/18 1400  . insulin aspart (novoLOG) injection 0-15 Units  0-15 Units Subcutaneous TID WC [name], MD   8 Units at 11/09/18 1353  . insulin aspart (novoLOG) injection 0-5 Units  0-5 Units Subcutaneous QHS [name], MD      . insulin glargine (LANTUS) injection 12 Units  12 Units Subcutaneous BID [name], MD   12 Units at 11/09/18 1035  . labetalol (NORMODYNE,TRANDATE) injection 20 mg  20 mg Intravenous Q4H PRN Burnetta Sabin L, NP   20 mg at 10/31/18 2359  . losartan (COZAAR) tablet 50 mg  50 mg Oral BID Burnetta Sabin L, NP   50 mg at 11/09/18 1035  . MEDLINE mouth rinse  15 mL Mouth Rinse q12n4p Burnetta Sabin L, NP   15 mL at 11/09/18 1354  . metoprolol tartrate (LOPRESSOR) tablet 25 mg  25 mg Oral BID [name], MD   25 mg at 11/09/18 1035  . multivitamin with minerals tablet 1 tablet  1 tablet Oral Daily Donzetta Starch, NP   1 tablet at 11/09/18 1035  . Resource ThickenUp Clear   Oral PRN Donzetta Starch, NP      . rosuvastatin (CRESTOR) tablet 20 mg  20 mg Oral Daily Burnetta Sabin L, NP   20 mg at 11/09/18 1035  . senna-docusate (Senokot-S) tablet 1 tablet  1 tablet Oral BID Donzetta Starch, NP   1 tablet at 11/09/18 1035     Discharge Medications: Please see discharge summary for a list of discharge medications.  Relevant Imaging Results:  Relevant Lab Results:   Additional Information SSN: [id number]  Philippa Chester Chani Ghanem, LCSWA

## 2018-11-09 NOTE — TOC Transition Note (Deleted)
Transition of Care [facility]) - CM/SW Discharge Note   Patient Details  Name: Tina [name] MRN: [id number] Date of Birth: 1966-07-02  Transition of Care [facility]) CM/SW Contact:  Gelene Mink, [place] Phone Number: 11/09/2018, 2:50 PM   Clinical Narrative:     Nurse to call report to [phone] and will go to room 101. PTAR has been called and scheduled.   Final next level of care: Skilled Nursing Facility Barriers to Discharge: No Barriers Identified   Patient Goals and CMS Choice Patient states their goals for this hospitalization and ongoing recovery are:: Pt wants husband to get rehab CMS Medicare.gov Compare Post Acute Care list provided to:: Patient Represenative (must comment) Choice offered to / list presented to : Spouse  Discharge Placement              Patient [facility] bed at: [facility] Patient to be transferred to facility by: [place] Name of family member notified: [name] Patient and family notified of of transfer: 11/09/18  Discharge Plan and Services     Post Acute Care Choice: [place]          DME Arranged: N/A DME Agency: NA       HH Arranged: NA HH Agency: NA        Social Determinants of Health (SDOH) Interventions     Readmission Risk Interventions No flowsheet data found.

## 2018-11-10 LAB — NOVEL CORONAVIRUS, NAA (HOSP ORDER, SEND-OUT TO REF LAB; TAT 18-24 HRS): SARS-CoV-2, NAA: NOT DETECTED

## 2018-11-10 NOTE — Progress Notes (Signed)
Inpatient Diabetes Program Recommendations  AACE/ADA: New Consensus Statement on Inpatient Glycemic Control (2015)  Target Ranges:  Prepandial:   less than 140 mg/dL      Peak postprandial:   less than 180 mg/dL (1-2 hours)      Critically ill patients:  140 - 180 mg/dL   Lab Results  Component Value Date   GLUCAP 140 (H) 11/09/2018   HGBA1C 6.6 (H) 10/22/2018    Review of Glycemic Control Results for Tina [name], Tina [name] (MRN [id number]) as of 11/10/2018 11:23  Ref. Range 11/08/2018 13:16 11/08/2018 15:37 11/08/2018 17:30 11/09/2018 06:17 11/09/2018 21:08  Glucose-Capillary Latest Ref Range: 70 - 99 mg/dL 314 (H) 346 (H) 276 (H) 116 (H) 140 (H)    Inpatient Diabetes Program Recommendations:  Noted postprandial CBGs elevated. -Novolog 3 units tid meal coverage if eats 50% or drinks supplement  Thank you, Nani Gasser. Cochise Dinneen, RN, MSN, CDE  Diabetes Coordinator Inpatient Glycemic Control Team Team Pager [phone] (8am-5pm) 11/10/2018 11:24 AM

## 2018-11-10 NOTE — Progress Notes (Signed)
STROKE TEAM PROGRESS NOTE   SUBJECTIVE (INTERVAL HISTORY) Medically ready for discharge to SNF was going to take her yesterday then they requested Covid test. 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   OBJECTIVE Vitals:   11/10/18 0414 11/10/18 0738 11/10/18 1138 11/10/18 1617  BP: (!) 146/73 (!) 166/74 (!) 152/90 140/88  Pulse: 70 74 72 70  Resp: 16 18 15  (!) 22  Temp: 98.6 F (37 C) 98.9 F (37.2 C) 98 F (36.7 C) 98 F (36.7 C)  TempSrc: Oral Oral Oral Oral  SpO2: 99% 99% 100% 100%  Weight:      Height:        CBC:  Recent Labs  Lab 11/04/18 0555 11/06/18 1308 11/08/18 0718  WBC 7.2 6.8 6.2  NEUTROABS 5.4  --   --   HGB 11.2* 10.9* 11.3*  HCT 34.7* 33.0* 33.3*  MCV 92.3 92.2 91.5  PLT 198 196 706    Basic Metabolic Panel:  Recent Labs  Lab 11/06/18 1308 11/08/18 0718  NA 139 135  K 3.3* 5.0  CL 101 99  CO2 27 24  GLUCOSE 225* 348*  BUN 10 9  CREATININE 1.23* 1.06*  CALCIUM 8.9 8.8*  MG  --  1.8    IMAGING past 24h No results found.    PHYSICAL EXAM    General - obese middle-aged Caucasian lady, not in distress.  Ophthalmologic - fundi not visualized due to noncooperation. Cardiovascular - Regular rhythm, not in A. Fib.  Neuro - Patient is laying in bed, awake,  she easily awakens to voice and says she wants to leave the hospital, inquires on when she is leaving. She is able to speak simple sentences and following simple commands, but not quite cooperative on exam due to hard of hearing/sleepy. Right ocular prosthesis missing, with ptosis.  Normal movements in the left eye.  No significant facial asymmetry, tongue midline in mouth.  Moving all extremities symmetrical in bed.  DTR not done, Sensation subjectively symmetrical and coordination no ataxia bilaterally by observation. Did not assess  gait  ASSESSMENT/PLAN [name] is a 53 y.o. female with history of type I diabetes, hypertension, paroxysmal A. fib on Coumadin, Turner syndrome and legal blindness presenting with AMS, mildly hypertensive and combative. She did not receive IV t-PA due to hemorrhage.  Right thalamic hemorrhage with intraventricular extension, likely hypertensive in the setting of Coumadin coagulopathy s/p reversal  CT head - Right thalamic hemorrhage with left IVH.   MRI head - unchanged ICH centered in the right thalamus with intraventricular extension.   CTA Head - Size stable right thalamic and intraventricular clot with dilatation of the left temporal horn. Small remote left thalamic and left cerebellar infarcts.  No aneurysm, or AVM  Repeat CT 4/7 unchanged R thalamic and IVH w/ temporal horm dilation  CT repeat 4/9 slight interval decrease R thalamic hmg. Slight decrease caliber of ventricles.   2D Echo - 06/12/2018 - EF 60 - 65%.   EEG - posterior background slowing. No seizure   LDL 65   HgbA1c - 6.6  UDS - positive for benzos   VTE prophylaxis - Heparin subcu  warfarin daily prior to admission, INR 2.6, reversed with vitamin K and Kcentra.  Now on No antithrombotic given hmg  NSG consulted, no intervention needed  Therapy recommendations:  SNF [facility] offered a bed on Saturday but wanted negative Covid 19 test before accepting pt). 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   Disposition:  Pending SNF  DKA with type 1 diabetes  HgbA1c 5.7, at goal < 7.0  On insulin IV in the ICU, now on Lantus  CBG monitoring - elvated past 24h with increased intake   Lantus increase to 7 units bid   SSI 0-9  Glucose a little better past couple of days, needs to be followed  PAF  In NSR  On Coumadin at home  INR 2.6  on admission, therapeutic -> reversed with Kcentra and vitamin K  INR now 1.1  Rate controlled  Hold off antithrombotic for now due to [place]  AKI with resolved hypernatremia  AKI - creatinine - 1.28->1.14->0.87->1.23-1.06  Na 138->139->135  Encourage p.o. intake  Agitation/encephalopathy  Calmer and less agitated  off precedex, amantadine and depakote   avoid benzos/opiates.   Pt limitations are likely personality driven as well.   Continue to observe  Hypertension  On the higher end   Treated with Cleviprex in ICU, now off . SBP goal < 160 mmHg  . Home meds:  cardizem 240, cozaar 50 bid . On cardizem 240, cozaar 50 bid and metoprolol 25 bid . Long-term BP goal normotensive   Hyperlipidemia  Lipid lowering medication PTA: Crestor 20 mg daily  LDL 65  Resumed Crestor 20  Continue statin at discharge  OSA, apnea, desaturations  As per husband, patient had OSA evaluation 1 year ago, not using CPAP at home  Intermittent desaturation during sleep on this admission  On and off CPAP throughout hospitalization, depending on agitation level  Ongoing refusal of CPAP  Dysphagia, resolved  Secondary to stroke  On D3 thin liquids  Encourage p.o. intake  Other Stroke Risk Factors  Former cigarette smoker - quit 30 years ago  ETOH use, advised to drink no more than 1 alcoholic beverage per day.  Obesity, Body mass index is 31.3 kg/m., recommend weight loss, diet and exercise as appropriate   Previous strokes by imaging - Small remote left thalamic and left cerebellar infarcts.  Other Active Problems  INR - 2.6 on admission - reversed -> 1.1  Anemia of chronic disease- 11.4->10.3->11.0->11.2->10.9->11.3  Leukocytosis, resolved 11.9->10.0->9.8->7.2->6.8  Hypokalemia - 3.3->3.3 - supplement ->5.0  Mg low normal 1.8   PLAN  SW reports [employer] has given insurance authorization for 7 days for the patient. Awaiting word on  disposition.  [facility] offered a bed on Saturday but wanted negative Covid 19 test before accepting pt - test pending (sent to outside lab per protocol). 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 Follow up Monday with SW.  Discharge summary shared under incomplete notes.  Hospital day # 31   Personally examined patient and images, and have participated in and made any corrections needed to history, physical, neuro exam,assessment and plan as stated above.  I have personally obtained the history, evaluated lab date, reviewed imaging studies and agree with radiology interpretations.   A total of 15 minutes was spent for the care of this patient,  spent on counseling patient and family on different diagnostic and therapeutic options, counseling and coordination of care, riskd ans benefits of management, compliance, or risk factor reduction and education.    To contact Stroke Continuity provider, please refer to [email or web address] After hours, contact General Neurology

## 2018-11-10 NOTE — Progress Notes (Signed)
Spoke with Gerald Stabs at [facility] came back negative. [facility] will order CPAP machine for the patient. The patient hasn't tolerated it in about two weeks but desatted on Saturday night in her sleep.   CSW will facilitate discharge once equipment has been ordered.   Domenic Schwab, MSW, [place]

## 2018-11-10 NOTE — Plan of Care (Signed)
Progressing towards goals

## 2018-11-10 NOTE — Progress Notes (Signed)
Patient refused cpap. Will continue to monitor

## 2018-11-10 NOTE — Plan of Care (Signed)
The Novel Coronavirus test requested by the SNF prior to admission resulted as "none detected". The plan was for discharge today; however, other issues have come to light.  The patient has a prosthetic right eye which was apparently misplaced at some point during her stay in the hospital. The nurse filed a safety zone report and the social worker will notify risk management.  In addition the patient carries a diagnosis of OSA but does not own a C -pap machine. She has refused to wear one during her stay but Dr Erlinda Hong reported that the pt does desat at times during the night. Therefore the case manager and social worker will look into arranging a sleep study and possibly obtaining a C pap device for the patient's use.  I have discussed these issues with the patient's nurse, the social worker, and the case manager and they have agreed to take the appropriate and required steps from here. Dr Jaynee Eagles was also made aware of the situation. It is our hope that the patient will be able to be discharged on Monday (tomorrow). The discharge summary has been prepared but will no doubt need some updating once the plan is set.  Mikey Bussing PA-C [facility] Pager [phone] 11/10/2018, 4:58 PM

## 2018-11-10 NOTE — TOC Transition Note (Deleted)
Transition of Care [facility]) - CM/SW Discharge Note   Patient Details  Name: Tina [name] MRN: [id number] Date of Birth: [birth year]  [facility] Care [facility]) CM/SW Contact:  Gelene Mink, [place] Phone Number: 11/10/2018, 2:46 PM   Clinical Narrative:     Nurse to call report to [phone] and will go to room 101. PTAR has been called and scheduled.    Final next level of care: Skilled Nursing Facility Barriers to Discharge: No Barriers Identified   Patient Goals and CMS Choice Patient states their goals for this hospitalization and ongoing recovery are:: Pt husband wants her to get rehab CMS Medicare.gov Compare Post Acute Care list provided to:: Patient Represenative (must comment) Choice offered to / list presented to : Spouse  Discharge Placement   Existing PASRR number confirmed : 11/10/18          Patient chooses bed at: [facility] Patient to be transferred to facility by: [place] Name of family member notified: [name] Patient and family notified of of transfer: 11/10/18  Discharge Plan and Services     Post Acute Care Choice: [place]          DME Arranged: N/A DME Agency: NA       HH Arranged: NA HH Agency: NA        Social Determinants of Health (SDOH) Interventions     Readmission Risk Interventions No flowsheet data found.

## 2018-11-11 DIAGNOSIS — R05 Cough: Secondary | ICD-10-CM | POA: Diagnosis not present

## 2018-11-11 DIAGNOSIS — I629 Nontraumatic intracranial hemorrhage, unspecified: Secondary | ICD-10-CM | POA: Diagnosis not present

## 2018-11-11 DIAGNOSIS — Z9641 Presence of insulin pump (external) (internal): Secondary | ICD-10-CM | POA: Diagnosis not present

## 2018-11-11 DIAGNOSIS — I4891 Unspecified atrial fibrillation: Secondary | ICD-10-CM | POA: Diagnosis not present

## 2018-11-11 DIAGNOSIS — M810 Age-related osteoporosis without current pathological fracture: Secondary | ICD-10-CM | POA: Diagnosis not present

## 2018-11-11 DIAGNOSIS — I69328 Other speech and language deficits following cerebral infarction: Secondary | ICD-10-CM | POA: Diagnosis not present

## 2018-11-11 DIAGNOSIS — N179 Acute kidney failure, unspecified: Secondary | ICD-10-CM | POA: Diagnosis not present

## 2018-11-11 DIAGNOSIS — M6281 Muscle weakness (generalized): Secondary | ICD-10-CM | POA: Diagnosis not present

## 2018-11-11 DIAGNOSIS — I69391 Dysphagia following cerebral infarction: Secondary | ICD-10-CM | POA: Diagnosis not present

## 2018-11-11 DIAGNOSIS — R262 Difficulty in walking, not elsewhere classified: Secondary | ICD-10-CM | POA: Diagnosis not present

## 2018-11-11 DIAGNOSIS — Z794 Long term (current) use of insulin: Secondary | ICD-10-CM | POA: Diagnosis not present

## 2018-11-11 DIAGNOSIS — E785 Hyperlipidemia, unspecified: Secondary | ICD-10-CM | POA: Diagnosis not present

## 2018-11-11 DIAGNOSIS — G4710 Hypersomnia, unspecified: Secondary | ICD-10-CM | POA: Diagnosis not present

## 2018-11-11 DIAGNOSIS — E1165 Type 2 diabetes mellitus with hyperglycemia: Secondary | ICD-10-CM | POA: Diagnosis not present

## 2018-11-11 DIAGNOSIS — I491 Atrial premature depolarization: Secondary | ICD-10-CM | POA: Diagnosis not present

## 2018-11-11 DIAGNOSIS — H547 Unspecified visual loss: Secondary | ICD-10-CM | POA: Diagnosis not present

## 2018-11-11 DIAGNOSIS — I10 Essential (primary) hypertension: Secondary | ICD-10-CM | POA: Diagnosis not present

## 2018-11-11 DIAGNOSIS — Q969 Turner's syndrome, unspecified: Secondary | ICD-10-CM | POA: Diagnosis not present

## 2018-11-11 DIAGNOSIS — G4733 Obstructive sleep apnea (adult) (pediatric): Secondary | ICD-10-CM | POA: Diagnosis not present

## 2018-11-11 LAB — CBC
HCT: 34 % — ABNORMAL LOW (ref 36.0–46.0)
Hemoglobin: 11.3 g/dL — ABNORMAL LOW (ref 12.0–15.0)
MCH: 30.9 pg (ref 26.0–34.0)
MCHC: 33.2 g/dL (ref 30.0–36.0)
MCV: 92.9 fL (ref 80.0–100.0)
Platelets: 209 10*3/uL (ref 150–400)
RBC: 3.66 MIL/uL — ABNORMAL LOW (ref 3.87–5.11)
RDW: 14.6 % (ref 11.5–15.5)
WBC: 6.8 10*3/uL (ref 4.0–10.5)
nRBC: 0 % (ref 0.0–0.2)

## 2018-11-11 LAB — BASIC METABOLIC PANEL
Anion gap: 8 (ref 5–15)
BUN: 8 mg/dL (ref 6–20)
CO2: 28 mmol/L (ref 22–32)
Calcium: 8.9 mg/dL (ref 8.9–10.3)
Chloride: 102 mmol/L (ref 98–111)
Creatinine, Ser: 1.12 mg/dL — ABNORMAL HIGH (ref 0.44–1.00)
GFR calc Af Amer: >60 mL/min (ref >60)
GFR calc non Af Amer: 56 mL/min — ABNORMAL LOW (ref >60)
Glucose, Bld: 226 mg/dL — ABNORMAL HIGH (ref 70–99)
Potassium: 4.5 mmol/L (ref 3.5–5.1)
Sodium: 138 mmol/L (ref 135–145)

## 2018-11-11 NOTE — Progress Notes (Signed)
Report given to [facility] at [facility]

## 2018-11-11 NOTE — Care Management Important Message (Signed)
Important Message  Patient Details  Name: Tina [name] MRN: [id number] Date of Birth: 06/07/1966   Medicare Important Message Given:  Yes    [name] 11/11/2018, 3:03 PM

## 2018-11-11 NOTE — Progress Notes (Signed)
Inpatient Diabetes Program Recommendations  AACE/ADA: New Consensus Statement on Inpatient Glycemic Control (2015)  Target Ranges:  Prepandial:   less than 140 mg/dL      Peak postprandial:   less than 180 mg/dL (1-2 hours)      Critically ill patients:  140 - 180 mg/dL   NO CBGs documented yesterday (04/26)--Not sure if meter is downloading??   Note that patient is waiting for Discharge to SNF.     MD- Note that patient required the following amount of Novolog SSI yesterday (04/26):  0 units SSI at 8am 15 units SSI at 12pm 8 units SSI at 4:30pm 0 units SSI at 9pm   MD- Please consider adding Novolog Meal Coverage:  Novolog 3 units TID with meals  (Please add the following Hold Parameters: Hold if pt eats <50% of meal, Hold if pt NPO)    --Will follow patient during hospitalization--  Wyn Quaker RN, MSN, CDE Diabetes Coordinator Inpatient Glycemic Control Team Team Pager: [phone] (8a-5p)

## 2018-11-11 NOTE — Progress Notes (Signed)
Occupational Therapy Treatment Patient Details Name: Tina [name] MRN: [id number] DOB: [birth year] Today's Date: 11/11/2018    History of present illness 53 year old female admitted 10/18/2018 with AMS.  Head CT = right thalamic hemorrhage with intraventricular extension. Most of the blood is in the left lateral ventricle. PMHx: DM, HTN, Paroxysmal A Fib, Turner syndrome, legal blindness.   OT comments  Pt supine in bed and eager to participate.  Patient completing bed mobility with min guard for safety, transfers with min guard and grooming at sink with min guard to brush teeth given cueing to locate items due to visual deficits but able to sequence task without cueing.  Patient reports vision is at baseline, able to manage functional mobility around obstacles with min cueing for problem solving.  Pt eager for d/c to rehab, continues to remain appropriate. Will follow.    Follow Up Recommendations  SNF;Supervision/Assistance - 24 hour    Equipment Recommendations  (defer to next venue of care)    Recommendations for Other Services PT consult;Speech consult    Precautions / Restrictions Precautions Precautions: Fall Restrictions Weight Bearing Restrictions: No       Mobility Bed Mobility Overal bed mobility: Needs Assistance Bed Mobility: Supine to Sit     Supine to sit: Min guard     General bed mobility comments: min guard for safety   Transfers Overall transfer level: Needs assistance Equipment used: Rolling walker (2 wheeled);None Transfers: Sit to/from Stand Sit to Stand: Min guard         General transfer comment: min guard for safety, using RW initially then transitioning to [facility]    Balance Overall balance assessment: Needs assistance Sitting-balance support: No upper extremity supported;Feet supported Sitting balance-Leahy Scale: Fair     Standing balance support: During functional activity;Bilateral upper extremity supported Standing balance-Leahy  Scale: Fair Standing balance comment: statically standing at sink with min guard, prefernce to UE support (as pt leaning on sink)                            ADL either performed or assessed with clinical judgement   ADL Overall ADL's : Needs assistance/impaired     Grooming: Wash/dry hands;Wash/dry face;Oral care;Standing;Min guard;Cueing for safety Grooming Details (indicate cue type and reason): min guard for safety, increased time with min cueing to locate items at sink but able to brush teeth without cueing                  Toilet Transfer: Min guard;Ambulation;RW Toilet Transfer Details (indicate cue type and reason): simulated to recliner, cueing for safety          Functional mobility during ADLs: Minimal assistance;Rolling walker;Cueing for safety General ADL Comments: increased alertness and eager to participate in session; eager to dc to rehab; pt able to engage in tasks with increased tolerance and decreased impulsivity today     Vision   Additional Comments: eyes open, missing R eye prosthetic; reports visual deficits baseline    Perception     Praxis      Cognition Arousal/Alertness: Awake/alert Behavior During Therapy: Flat affect;Impulsive Overall Cognitive Status: Impaired/Different from baseline Area of Impairment: Following commands;Attention;Memory;Problem solving;Awareness;Safety/judgement                   Current Attention Level: Sustained(during ADL tasks ) Memory: Decreased short-term memory Following Commands: Follows one step commands inconsistently;Follows one step commands with increased time Safety/Judgement: Decreased awareness of safety;Decreased  awareness of deficits Awareness: Intellectual Problem Solving: Slow processing;Decreased initiation;Requires verbal cues General Comments: pt with improved engagement during functioanl tasks, able to follow 1 step commands but limited carryover of multiple step commands          Exercises     Shoulder Instructions       General Comments      Pertinent Vitals/ Pain       Pain Assessment: No/denies pain  Home Living                                          Prior Functioning/Environment              Frequency  Min 2X/week        Progress Toward Goals  OT Goals(current goals can now be found in the care plan section)  Progress towards OT goals: Progressing toward goals  Acute Rehab OT Goals Patient Stated Goal: to get to rehab OT Goal Formulation: With patient  Plan Discharge plan remains appropriate;Frequency remains appropriate    Co-evaluation    PT/OT/SLP Co-Evaluation/Treatment: Yes Reason for Co-Treatment: Complexity of the patient's impairments (multi-system involvement);For patient/therapist safety   OT goals addressed during session: ADL's and self-care      AM-PAC OT "6 Clicks" Daily Activity     Outcome Measure   Help from another person eating meals?: A Little Help from another person taking care of personal grooming?: A Little Help from another person toileting, which includes using toliet, bedpan, or urinal?: A Lot Help from another person bathing (including washing, rinsing, drying)?: A Lot Help from another person to put on and taking off regular upper body clothing?: A Lot Help from another person to put on and taking off regular lower body clothing?: A Lot 6 Click Score: 14    End of Session Equipment Utilized During Treatment: Gait belt;Rolling walker  OT Visit Diagnosis: Unsteadiness on feet (R26.81);Other abnormalities of gait and mobility (R26.89);Muscle weakness (generalized) (M62.81);Other symptoms and signs involving cognitive function   Activity Tolerance Patient tolerated treatment well   Patient Left in chair;with call bell/phone within reach;with chair alarm set   Nurse Communication Mobility status        Time: 5409-8119 OT Time Calculation (min): 26 min  Charges: OT  General Charges $OT Visit: 1 Visit OT Treatments $Self Care/Home Management : 8-22 mins  Tina [name], [place] Pager [phone] Office [phone]    Tina [name] 11/11/2018, 10:01 AM

## 2018-11-11 NOTE — Progress Notes (Signed)
Pt being discharged to [facility] in [place]. Report given to [facility] Nature conservation officer) at facility. IV removed. CCMD notified. Discharge packet including instructions given to patient. All belongings with patient. Patient leaving unit via wheelchair.

## 2018-11-11 NOTE — Progress Notes (Signed)
Physical Therapy Treatment Patient Details Name: Tina [name] MRN: [id number] DOB: [birth year] Today's Date: 11/11/2018    History of Present Illness 53 year old female admitted 10/18/2018 with AMS.  Head CT = right thalamic hemorrhage with intraventricular extension. Most of the blood is in the left lateral ventricle. PMHx: DM, HTN, Paroxysmal A Fib, Turner syndrome, legal blindness.    PT Comments    Pt progressing towards physical therapy goals. Was able to perform transfers and ambulation with min guard assist and RW for support. Without RW, pt requiring min assist for balance and safety. Pt easily engaged this session, immediately sitting up EOB when therapists arrived. Minimal cues required for avoiding obstacles in the hallway. Asking several times when she will be going to rehab. Appears anxious to go to rehab so she can go home. Will continue to follow.      Follow Up Recommendations  SNF;Supervision/Assistance - 24 hour     Equipment Recommendations  Rolling walker with 5" wheels    Recommendations for Other Services       Precautions / Restrictions Precautions Precautions: Fall Restrictions Weight Bearing Restrictions: No    Mobility  Bed Mobility Overal bed mobility: Needs Assistance Bed Mobility: Supine to Sit     Supine to sit: Min guard     General bed mobility comments: min guard for safety   Transfers Overall transfer level: Needs assistance Equipment used: Rolling walker (2 wheeled);None Transfers: Sit to/from Stand Sit to Stand: Min guard         General transfer comment: Close guard for safety as pt powered up to full standing position. RW for safety.  Ambulation/Gait Ambulation/Gait assistance: Min guard;Min assist Gait Distance (Feet): 350 Feet Assistive device: Rolling walker (2 wheeled) Gait Pattern/deviations: Step-through pattern;Decreased stride length;Wide base of support Gait velocity: decreased Gait velocity interpretation:  1.31 - 2.62 ft/sec, indicative of limited community ambulator General Gait Details: Pt ambulating well with RW and steering well with VC's due to low vision. Pt requiring light min guard assist. Pt progressed to 1 HHA and no RW by end of gait training. Min assist required during this time.    Stairs Stairs: Yes Stairs assistance: Min guard Stair Management: Two rails;Alternating pattern;Forwards Number of Stairs: 5 General stair comments: VC's for general safety but generally steady.    Wheelchair Mobility    Modified Rankin (Stroke Patients Only) Modified Rankin (Stroke Patients Only) Pre-Morbid Rankin Score: No symptoms Modified Rankin: Moderately severe disability     Balance Overall balance assessment: Needs assistance Sitting-balance support: No upper extremity supported;Feet supported Sitting balance-Leahy Scale: Fair     Standing balance support: During functional activity;Bilateral upper extremity supported Standing balance-Leahy Scale: Fair Standing balance comment: statically standing at sink with min guard, prefernce to UE support (as pt leaning on sink)                             Cognition Arousal/Alertness: Awake/alert Behavior During Therapy: Flat affect;Impulsive Overall Cognitive Status: Impaired/Different from baseline Area of Impairment: Following commands;Attention;Memory;Problem solving;Awareness;Safety/judgement                   Current Attention Level: Sustained(during ADL tasks ) Memory: Decreased short-term memory Following Commands: Follows one step commands inconsistently;Follows one step commands with increased time Safety/Judgement: Decreased awareness of safety;Decreased awareness of deficits Awareness: Intellectual Problem Solving: Slow processing;Decreased initiation;Requires verbal cues General Comments: pt with improved engagement during functioanl tasks, able to follow 1  step commands but limited carryover of multiple  step commands       Exercises      General Comments        Pertinent Vitals/Pain Pain Assessment: No/denies pain Faces Pain Scale: No hurt Pain Intervention(s): Monitored during session    Home Living                      Prior Function            PT Goals (current goals can now be found in the care plan section) Acute Rehab PT Goals Patient Stated Goal: to get to rehab today. PT Goal Formulation: Patient unable to participate in goal setting Time For Goal Achievement: 11/21/18 Potential to Achieve Goals: Good Progress towards PT goals: Progressing toward goals    Frequency    Min 4X/week      PT Plan Current plan remains appropriate    Co-evaluation PT/OT/SLP Co-Evaluation/Treatment: Yes Reason for Co-Treatment: Necessary to address cognition/behavior during functional activity;For patient/therapist safety;To address functional/ADL transfers PT goals addressed during session: Mobility/safety with mobility;Balance;Proper use of DME OT goals addressed during session: ADL's and self-care      AM-PAC PT "6 Clicks" Mobility   Outcome Measure  Help needed turning from your back to your side while in a flat bed without using bedrails?: A Little Help needed moving from lying on your back to sitting on the side of a flat bed without using bedrails?: A Lot Help needed moving to and from a bed to a chair (including a wheelchair)?: A Lot Help needed standing up from a chair using your arms (e.g., wheelchair or bedside chair)?: A Lot Help needed to walk in hospital room?: A Lot Help needed climbing 3-5 steps with a railing? : Total 6 Click Score: 12    End of Session Equipment Utilized During Treatment: Gait belt Activity Tolerance: Patient tolerated treatment well Patient left: in chair;with call bell/phone within reach;with chair alarm set Nurse Communication: Mobility status PT Visit Diagnosis: Other symptoms and signs involving the nervous system  (R29.898);Other abnormalities of gait and mobility (R26.89)     Time: 0370-4888 PT Time Calculation (min) (ACUTE ONLY): 26 min  Charges:  $Gait Training: 8-22 mins                     Tina [name], PT, DPT Acute Rehabilitation Services Pager: [phone] Office: [phone]    Tina [name] 11/11/2018, 10:34 AM

## 2018-11-11 NOTE — TOC Transition Note (Signed)
Transition of Care [facility]) - CM/SW Discharge Note   Patient Details  Name: Tina [name] MRN: [id number] Date of Birth: 29-Jul-1965  Transition of Care [facility]) CM/SW Contact:  Geralynn Ochs, LCSW Phone Number: 11/11/2018, 1:42 PM   Clinical Narrative:  Nurse to call report to [phone], Room 103     Final next level of care: Skilled Nursing Facility Barriers to Discharge: No Barriers Identified   Patient Goals and CMS Choice Patient states their goals for this hospitalization and ongoing recovery are:: Pt husband wants her to get rehab CMS Medicare.gov Compare Post Acute Care list provided to:: Patient Represenative (must comment) Choice offered to / list presented to : Spouse  Discharge Placement   Existing PASRR number confirmed : 11/10/18          Patient chooses bed at: [facility] Patient to be transferred to facility by: Family car Name of family member notified: [name] Patient and family notified of of transfer: 11/11/18  Discharge Plan and Services     Post Acute Care Choice: [place]          DME Arranged: N/A DME Agency: NA       HH Arranged: NA HH Agency: NA        Social Determinants of Health (SDOH) Interventions     Readmission Risk Interventions No flowsheet data found.

## 2018-11-12 ENCOUNTER — Other Ambulatory Visit: Payer: Self-pay

## 2018-11-12 NOTE — Patient Outreach (Signed)
  [facility]) Care Management Chronic Special Needs Program    11/12/2018  Name: Tina [name], DOB: [birth year]  MRN: [id number]   Tina [name] is enrolled in a chronic special needs plan for Diabetes. Client admitted to [facility] hospital on 10/18/18 with altered mental status and intracranial bleed. Client discharged 11/11/18 from hospital to [facility] in [place]. Plan to for [place] Acute RN to monitor client while in rehab and will follow up with client post discharge from rehab  [place], [facility], [place] Management Coordinator [place] Management [phone]

## 2018-11-14 ENCOUNTER — Other Ambulatory Visit: Payer: Self-pay

## 2018-11-14 ENCOUNTER — Other Ambulatory Visit: Payer: Self-pay | Admitting: *Deleted

## 2018-11-14 NOTE — Patient Outreach (Signed)
[place] [facility]) Care Management  11/14/2018  Tina [name] September 29, 1965 [id number]   Telephone call from [place] [facility] [place] to inquire about member while at [facility]. Member is active with the  [facility] Chronic Special Needs Program.  Writer spoke with [facility] UM RN to obtain update on member's status while at [facility].  [facility] UM RN reports member wants to discharge home this Saturday. Facility reportedly has concerns with member's disposition plans in returning home to care for her disabled husband. Member reportedly will have home health arranged at discharge.   Made C-SNP RN [facility] aware of all of the above.  Will update C-SNP RN [facility] of member's SNF discharge date.    [name], MSN-Ed, RN,BSN [place] Acute Care Coordinator [phone]

## 2018-11-14 NOTE — Patient Outreach (Signed)
  [place] [facility]) Care Management Chronic Special Needs Program    11/14/2018  Name: Tina [name], DOB: [birth year]  MRN: [id number]   Ms. Zorana Valenti-Cohen is enrolled in a chronic special needs plan for Diabetes. [place] acute RNCM about client who is at [facility] in [place].   She will keep RNCM updated when client is discharged and if there are any updates from facility. Plan to outreach to client when discharged to complete health risk assessment and Interdisciplinary care plan   [name] RN, [facility], [place] Management [phone]

## 2018-11-15 LAB — GLUCOSE, CAPILLARY
Glucose-Capillary: 270 mg/dL — ABNORMAL HIGH (ref 70–99)
Glucose-Capillary: 163 mg/dL — ABNORMAL HIGH (ref 70–99)
Glucose-Capillary: 262 mg/dL — ABNORMAL HIGH (ref 70–99)
Glucose-Capillary: 164 mg/dL — ABNORMAL HIGH (ref 70–99)
Glucose-Capillary: 154 mg/dL — ABNORMAL HIGH (ref 70–99)
Glucose-Capillary: 360 mg/dL — ABNORMAL HIGH (ref 70–99)
Glucose-Capillary: 213 mg/dL — ABNORMAL HIGH (ref 70–99)
Glucose-Capillary: 192 mg/dL — ABNORMAL HIGH (ref 70–99)
Glucose-Capillary: 198 mg/dL — ABNORMAL HIGH (ref 70–99)
Glucose-Capillary: 312 mg/dL — ABNORMAL HIGH (ref 70–99)
Glucose-Capillary: 190 mg/dL — ABNORMAL HIGH (ref 70–99)

## 2018-11-18 DIAGNOSIS — Q969 Turner's syndrome, unspecified: Secondary | ICD-10-CM | POA: Diagnosis not present

## 2018-11-18 DIAGNOSIS — Z9641 Presence of insulin pump (external) (internal): Secondary | ICD-10-CM | POA: Diagnosis not present

## 2018-11-18 DIAGNOSIS — M810 Age-related osteoporosis without current pathological fracture: Secondary | ICD-10-CM | POA: Diagnosis not present

## 2018-11-18 DIAGNOSIS — I129 Hypertensive chronic kidney disease with stage 1 through stage 4 chronic kidney disease, or unspecified chronic kidney disease: Secondary | ICD-10-CM | POA: Diagnosis not present

## 2018-11-18 DIAGNOSIS — I69351 Hemiplegia and hemiparesis following cerebral infarction affecting right dominant side: Secondary | ICD-10-CM | POA: Diagnosis not present

## 2018-11-18 DIAGNOSIS — M1990 Unspecified osteoarthritis, unspecified site: Secondary | ICD-10-CM | POA: Diagnosis not present

## 2018-11-18 DIAGNOSIS — E10311 Type 1 diabetes mellitus with unspecified diabetic retinopathy with macular edema: Secondary | ICD-10-CM | POA: Diagnosis not present

## 2018-11-18 DIAGNOSIS — I69311 Memory deficit following cerebral infarction: Secondary | ICD-10-CM | POA: Diagnosis not present

## 2018-11-18 DIAGNOSIS — E785 Hyperlipidemia, unspecified: Secondary | ICD-10-CM | POA: Diagnosis not present

## 2018-11-18 DIAGNOSIS — Z87891 Personal history of nicotine dependence: Secondary | ICD-10-CM | POA: Diagnosis not present

## 2018-11-18 DIAGNOSIS — E1022 Type 1 diabetes mellitus with diabetic chronic kidney disease: Secondary | ICD-10-CM | POA: Diagnosis not present

## 2018-11-18 DIAGNOSIS — Z970 Presence of artificial eye: Secondary | ICD-10-CM | POA: Diagnosis not present

## 2018-11-18 DIAGNOSIS — H9193 Unspecified hearing loss, bilateral: Secondary | ICD-10-CM | POA: Diagnosis not present

## 2018-11-18 DIAGNOSIS — N183 Chronic kidney disease, stage 3 (moderate): Secondary | ICD-10-CM | POA: Diagnosis not present

## 2018-11-18 DIAGNOSIS — Z9001 Acquired absence of eye: Secondary | ICD-10-CM | POA: Diagnosis not present

## 2018-11-18 DIAGNOSIS — I480 Paroxysmal atrial fibrillation: Secondary | ICD-10-CM | POA: Diagnosis not present

## 2018-11-18 DIAGNOSIS — H547 Unspecified visual loss: Secondary | ICD-10-CM | POA: Diagnosis not present

## 2018-11-19 ENCOUNTER — Other Ambulatory Visit: Payer: Self-pay

## 2018-11-19 NOTE — Patient Outreach (Signed)
[place] [facility]) Care Management Chronic Special Needs Program  11/19/2018  Name: Tina [name] DOB: [birth year]  MRN: [id number]  Tina [name] is enrolled in a chronic special needs plan for Diabetes. Chronic Care Management Coordinator telephoned client to review health risk assessment and to develop individualized care plan.  Introduced the chronic care management program, importance of client participation, and taking their care plan to all provider appointments and inpatient facilities.  Reviewed the transition of care process and possible referral to community care management. Client had to cut off call early as she had to go to MD appt. Subjective:Client states she is to go see her doctor today for follow up after coming home.  Husband states he is giving client her medication.  Client states that [place] has been out to see her and she is to have a nurse and physical therapist.  States she is wearing her pump and her blood sugars have been ranging 78-100 in the mornings.  States she is using her walker.  States she is forgetful since her stroke but she is starting to slowly get back her memory.  Goals Addressed            This Visit's Progress   .  Acknowledge receipt of Programme researcher, broadcasting/film/video      . Advanced Care Planning complete by next 9 months      . Client understands the importance of follow-up with providers by attending scheduled visits      . Client will not report change from baseline and no repeated symptoms of stroke with in the next 3 months       . Client will report abillity to obtain Medications within next 3 months      . Client will report no fall or injuries in the next 3 months.      . Client will report no worsening of symptoms of Atrial Fibrillation within the next 6 months      . Client will use Assistive Devices as needed and verbalize understanding of device use      . Client/Caregiver will verbalize understanding of  instructions related to self-care and safety      . Decrease inpatient admissions/ readmissions with in the next year      . Decrease inpatient diabetes admissions/readmissions with in the year      . General - Client will not be readmitted within 30 days (C-SNP)       Please follow discharge instructions and call provider if you have any questions. Please attend all follow up appointments as scheduled. Please take your medications as prescribed. Please call 24 Hour nurse advice line as needed [phone]).    Marland Kitchen HEMOGLOBIN A1C < 7.0      . Maintain timely refills of diabetic medication as prescribed within the year .      Marland Kitchen Obtain annual  Lipid Profile, LDL-C      . Obtain Annual Eye (retinal)  Exam       . Obtain Annual Foot Exam      . Obtain annual screen for micro albuminuria (urine) , nephropathy (kidney problems)      . Obtain Hemoglobin A1C at least 2 times per year      . Visit Primary Care Provider or Endocrinologist at least 2 times per year        Client has some difficulty remembering details of her diabetes management but husband assists her with answers.   Client is  meeting diabetes self management goal of hemoglobin A1C of <7% with last reading of 6.6%.  Client is to establish care with Dr. Buddy Duty for her diabetes. Client and husband agreeable to referral to pharmacy for medication review and assistance with cost. Declines referral to social work for Advanced Directives at this time and denies any other Education officer, museum needs   Plan:  Send successful outreach letter with a copy of their individualized care plan, Send individual care plan to provider and Send educational material  Plan to contact [place] to get update and to coordinate care  Chronic care management coordination will outreach in:  1-2 days to complete initial assessment   Will refer client to:  [place] RN, [facility], [place] Management [phone]

## 2018-11-20 ENCOUNTER — Other Ambulatory Visit: Payer: Self-pay

## 2018-11-20 ENCOUNTER — Ambulatory Visit: Payer: PPO

## 2018-11-20 NOTE — Patient Outreach (Signed)
  [place] [facility]) Care Management Chronic Special Needs Program   11/20/2018  Name: Renae Mottley, DOB: 1966/03/22  MRN: [id number]  The client was discussed in today's interdisciplinary care team meeting.  The following issues were discussed:  Client's needs, Changes in health status, Key risk triggers/risk stratification, Care Plan, Coordination of care, Care transitions and Issues/barriers to care  Participants present:  Mahlon Gammon, MSN, RN, CCM, CNS    Thea Silversmith, MSN, RN, CCM   Lamine Laton RN,BSN,CCM, CDE     Marco Collie, MD Coralie Carpen, MD   Recommendations:  Pharmacy referral for medication review, Follow up with Home care to coordinate care, Provide education on HTN and stroke, home safety and atrial fibrillation   Plan:  Pharmacy referral for medication review, Follow up with Home care to coordinate care, Provide education on HTN and stroke, home safety and atrial fibrillation    Follow-up:  In 1-2 weeks with client  [name] RN, [facility], [place] Management Coordinator [place] Management [phone]

## 2018-11-21 ENCOUNTER — Telehealth: Payer: Self-pay | Admitting: Pharmacist

## 2018-11-21 NOTE — Patient Outreach (Signed)
Tina [name]) Care Management  [place]   11/21/2018  Tina [name] [birth year] [id number]  Reason for referral: Medication Review, Medication Adherence, Medication Management  Referral source: Health Team [employer] C-SNP Care Manager with [name] Current insurance: Health Team Advantage C-SNP  PMHx includes but not limited to:   A fib, hypertension, hyperlipidemia, OSA, Reflux, Turner syndrome, insulin-dependent diabetes. Patient was hospitalized 10/18/2018-11/11/2018 for intracerebral hemorrhage. She had a subsequent stay at [facility].  Outreach:  Successful telephone call with patient and husband. Patient's husband manages his medications. HIPAA identifiers verified.   Subjective:  Patient reports using pill box as an adherence strategy.  Does the patient ever forget to take medication?  yes Does the patient have problems obtaining medications due to transportation?   no Does the patient have problems obtaining medications due to cost?  no  Does the patient feel that medications prescribed are effective?  yes Does the patient ever experience any side effects to the medications prescribed?  no  Does the patient measure his/her own blood glucose at home?  Yes Does the patient measure his/her own blood pressure at home? Yes   Objective: Lab Results  Component Value Date   CREATININE 1.12 (H) 11/11/2018   CREATININE 1.06 (H) 11/08/2018   CREATININE 1.23 (H) 11/06/2018    Lab Results  Component Value Date   HGBA1C 6.6 (H) 10/22/2018    Lipid Panel     Component Value Date/Time   CHOL 131 10/22/2018 0550   TRIG 119 10/22/2018 0550   HDL 42 10/22/2018 0550   CHOLHDL 3.1 10/22/2018 0550   VLDL 24 10/22/2018 0550   LDLCALC 65 10/22/2018 0550    BP Readings from Last 3 Encounters:  11/11/18 (!) 136/92  08/06/18 120/70  06/19/18 (!) 187/100    Allergies  Allergen Reactions  . Acetazolamide Anaphylaxis and Other (See Comments)   Reaction, drop in Blood pressure that caused patient to stay in bed for 5 days, inconherient  Reaction, drop in Blood pressure that caused patient to stay in bed for 5 days, inconherient  . Ace Inhibitors Cough    Medications Reviewed Today    Reviewed by Elayne Guerin, [name] (Pharmacist) on 11/21/18 at 1452  Med List Status: <None>  Medication Order Taking? Sig Documenting Provider Last Dose Status Informant  acetaminophen (TYLENOL) 325 MG tablet [id number] Yes Take 2 tablets (650 mg total) by mouth every 4 (four) hours as needed for mild pain (or temp > 37.5 C (99.5 F)). Rinehuls, Early Chars, PA-C Taking Active   diltiazem (CARDIZEM CD) 240 MG 24 hr capsule [id number] Yes Take 1 capsule (240 mg total) by mouth daily. [name], MD Taking Active Spouse/Significant Other           Med Note Regino Bellow Nov 21, 2018  2:49 PM)         Patient not taking:       Discontinued 11/21/18 1449 (No longer needed (for PRN medications))        Patient not taking:       Discontinued 11/21/18 1450 (Completed Course)        Patient not taking:       Discontinued 11/21/18 1450 (Change in therapy)   losartan (COZAAR) 50 MG tablet [id number] Yes Take 1 tablet (50 mg total) by mouth 2 (two) times daily. Rinehuls, Early Chars, PA-C Taking Active   magnesium oxide (MAG-OX) 400 MG tablet [id number] Yes Take 400 mg by mouth  daily. [provider] Taking Active Spouse/Significant Other  metoprolol tartrate (LOPRESSOR) 25 MG tablet [id number] Yes Take 1 tablet (25 mg total) by mouth 2 (two) times daily. Dwaine Gale, PA-C Taking Active        Patient not taking:       Discontinued 11/21/18 1451 (Completed Course)        Patient not taking:       Discontinued 11/21/18 1451 (Patient Preference)   rosuvastatin (CRESTOR) 20 MG tablet [id number] Yes Take 20 mg by mouth daily. [provider] Taking Active Spouse/Significant Other           Med Note Regino Bellow Nov 21, 2018   2:51 PM)         Patient not taking:       Discontinued 11/21/18 1452 (Completed Course)   Vitamin D, Ergocalciferol, (DRISDOL) 50000 units CAPS capsule [id number] No Take 1 capsule (50,000 Units total) by mouth every 7 (seven) days.  Patient not taking:  Reported on 10/18/2018   Princess Bruins, MD Unknown Active Spouse/Significant Other           Med Note Leanord Asal, TREVINA M   Fri Oct 18, 2018  2:02 PM) LF 02/14/18          Assessment: Reviewed patient's medications over the phone with her husband/caregiver.  Drugs sorted by system:  Cardiovascular: Diltiazem, Losartan, Metoprolol, Rosuvastatin  Endocrine: Novolog  Pain: Acetaminophen  Vitamins/Minerals/Supplements: Magnesium Oxide,   Medication Review Findings:  HgA1c 5.8% 03/04/18  Medication Adherence Findings: Adherence Review  '[]'$  Excellent (no doses missed/week)     '[x]'$  Good (no more than 1 dose missed/week)     '[]'$  Partial (2-3 doses missed/week) '[]'$  Poor (>3 doses missed/week)  Patient/caregiver  with excellent understanding of regimen and excellent understanding of indications.    Medication Assistance Findings:  Medication assistance needs identified: Novolog.  Extra Help:  Not eligible for Extra Help Low Income Subsidy based on reported income and assets  Patient Assistance Programs: Novolog made by [place] requirement met: Yes o Out-of-pocket prescription expenditure met:   Not Applicable - Patient has met application requirements to apply for this patient assistance program.      Additional medication assistance options reviewed with patient as warranted:  Visual merchandiser  Plan: . Will route note to [place] to send an application to the patient..  . Will follow-up in 1 month.  Elayne Guerin, PharmD, [place] Clinical Pharmacist [phone]

## 2018-11-26 ENCOUNTER — Other Ambulatory Visit: Payer: Self-pay | Admitting: Pharmacy Technician

## 2018-11-26 ENCOUNTER — Other Ambulatory Visit: Payer: Self-pay

## 2018-11-26 NOTE — Patient Outreach (Signed)
  [place] [facility]) Care Management Chronic Special Needs Program    11/26/2018  Name: Tina [name], DOB: [birth year]  MRN: [id number]   [name] is enrolled in a chronic special needs plan for Diabetes. Call to follow up with client. States that she has been busy with all of people from home health coming to see her.  States she has been doing good and getting stronger.  States her husband continues to help her with her medications and with everything else.  States she did have one episode of when her blood sugar went down in the 40's.  States she took her insulin and had to wait to get her take out food.  States she is going to see her doctor this week.   Called [facility] and spoke with Dianna Rossetti RN about clients plan of care.  She is receiving skilled nursing, PT, OT and speech therapy.  Reports that husband is supportive in care of client. Plan to follow up with client in 3 weeks or sooner if needed.  [name] RN, [name], CDE Chronic Care Management Coordinator [facility] Care Management [phone]

## 2018-11-26 NOTE — Patient Outreach (Signed)
[place] [facility]) Care Management  11/26/2018  Tina [name] [birth year] [id number]                           Medication Assistance Referral  Referral From: [place]  Medication/Company: [employer] / [employer]  Patient application portion:  Education officer, museum portion: Faxed  to Dr Yaakov Guthrie    Follow up:  Will follow up with patient in 5-10 business days to confirm application(s) have been received.  [name] [name], [name] Management [phone]

## 2018-11-29 DIAGNOSIS — H35372 Puckering of macula, left eye: Secondary | ICD-10-CM | POA: Diagnosis not present

## 2018-11-29 DIAGNOSIS — E103592 Type 1 diabetes mellitus with proliferative diabetic retinopathy without macular edema, left eye: Secondary | ICD-10-CM | POA: Diagnosis not present

## 2018-11-29 DIAGNOSIS — H3582 Retinal ischemia: Secondary | ICD-10-CM | POA: Diagnosis not present

## 2018-11-29 DIAGNOSIS — H3581 Retinal edema: Secondary | ICD-10-CM | POA: Diagnosis not present

## 2018-12-03 ENCOUNTER — Other Ambulatory Visit: Payer: Self-pay | Admitting: Pharmacy Technician

## 2018-12-03 DIAGNOSIS — Q969 Turner's syndrome, unspecified: Secondary | ICD-10-CM | POA: Diagnosis not present

## 2018-12-03 DIAGNOSIS — Z970 Presence of artificial eye: Secondary | ICD-10-CM | POA: Diagnosis not present

## 2018-12-03 DIAGNOSIS — Z87891 Personal history of nicotine dependence: Secondary | ICD-10-CM | POA: Diagnosis not present

## 2018-12-03 DIAGNOSIS — I69311 Memory deficit following cerebral infarction: Secondary | ICD-10-CM | POA: Diagnosis not present

## 2018-12-03 DIAGNOSIS — I69351 Hemiplegia and hemiparesis following cerebral infarction affecting right dominant side: Secondary | ICD-10-CM | POA: Diagnosis not present

## 2018-12-03 DIAGNOSIS — M810 Age-related osteoporosis without current pathological fracture: Secondary | ICD-10-CM | POA: Diagnosis not present

## 2018-12-03 DIAGNOSIS — M1990 Unspecified osteoarthritis, unspecified site: Secondary | ICD-10-CM | POA: Diagnosis not present

## 2018-12-03 DIAGNOSIS — I129 Hypertensive chronic kidney disease with stage 1 through stage 4 chronic kidney disease, or unspecified chronic kidney disease: Secondary | ICD-10-CM | POA: Diagnosis not present

## 2018-12-03 DIAGNOSIS — E785 Hyperlipidemia, unspecified: Secondary | ICD-10-CM | POA: Diagnosis not present

## 2018-12-03 DIAGNOSIS — E10311 Type 1 diabetes mellitus with unspecified diabetic retinopathy with macular edema: Secondary | ICD-10-CM | POA: Diagnosis not present

## 2018-12-03 DIAGNOSIS — H9193 Unspecified hearing loss, bilateral: Secondary | ICD-10-CM | POA: Diagnosis not present

## 2018-12-03 DIAGNOSIS — H547 Unspecified visual loss: Secondary | ICD-10-CM | POA: Diagnosis not present

## 2018-12-03 DIAGNOSIS — Z9001 Acquired absence of eye: Secondary | ICD-10-CM | POA: Diagnosis not present

## 2018-12-03 DIAGNOSIS — N183 Chronic kidney disease, stage 3 (moderate): Secondary | ICD-10-CM | POA: Diagnosis not present

## 2018-12-03 DIAGNOSIS — I480 Paroxysmal atrial fibrillation: Secondary | ICD-10-CM | POA: Diagnosis not present

## 2018-12-03 DIAGNOSIS — Z9641 Presence of insulin pump (external) (internal): Secondary | ICD-10-CM | POA: Diagnosis not present

## 2018-12-03 DIAGNOSIS — E1022 Type 1 diabetes mellitus with diabetic chronic kidney disease: Secondary | ICD-10-CM | POA: Diagnosis not present

## 2018-12-03 NOTE — Patient Outreach (Signed)
[place] [facility]) Care Management  12/03/2018  Tina [name] 28-Jan-1966 [id number]    Incoming call received from [place] at Dr. [name] office.  [name] was returning my call inquiring about the sig for the Novolog application.  Per [name] patient informed she uses between 75-80 units per day in her pump based on her blood sugar. Informed [name] that I would write that in on the application and she was agreeable.  Will submit application once patient's part has been received back.  [name] P. Tramain Gershman, [place] Management [phone]

## 2018-12-03 NOTE — Patient Outreach (Signed)
[facility]) Care Management  12/03/2018  Mirna Valenti-Cohen 25-Sep-1965 [id number]    Care coordination call placed to Dr. [name] office in regards to [facility] prescription for [employer] application.  Left message for nurse to return the call so I could find out the directions to place on the application.  Will followup with 10-14 business days if call is not returned or when the patient's part of the application has been received back.  Wladyslawa Disbro P. Ved Martos, [place] Management [phone]

## 2018-12-04 DIAGNOSIS — Q969 Turner's syndrome, unspecified: Secondary | ICD-10-CM | POA: Diagnosis not present

## 2018-12-04 DIAGNOSIS — M810 Age-related osteoporosis without current pathological fracture: Secondary | ICD-10-CM | POA: Diagnosis not present

## 2018-12-04 DIAGNOSIS — Z9001 Acquired absence of eye: Secondary | ICD-10-CM | POA: Diagnosis not present

## 2018-12-04 DIAGNOSIS — E785 Hyperlipidemia, unspecified: Secondary | ICD-10-CM | POA: Diagnosis not present

## 2018-12-04 DIAGNOSIS — I69351 Hemiplegia and hemiparesis following cerebral infarction affecting right dominant side: Secondary | ICD-10-CM | POA: Diagnosis not present

## 2018-12-04 DIAGNOSIS — I69311 Memory deficit following cerebral infarction: Secondary | ICD-10-CM | POA: Diagnosis not present

## 2018-12-04 DIAGNOSIS — I480 Paroxysmal atrial fibrillation: Secondary | ICD-10-CM | POA: Diagnosis not present

## 2018-12-04 DIAGNOSIS — H547 Unspecified visual loss: Secondary | ICD-10-CM | POA: Diagnosis not present

## 2018-12-04 DIAGNOSIS — E10311 Type 1 diabetes mellitus with unspecified diabetic retinopathy with macular edema: Secondary | ICD-10-CM | POA: Diagnosis not present

## 2018-12-04 DIAGNOSIS — Z970 Presence of artificial eye: Secondary | ICD-10-CM | POA: Diagnosis not present

## 2018-12-04 DIAGNOSIS — Z87891 Personal history of nicotine dependence: Secondary | ICD-10-CM | POA: Diagnosis not present

## 2018-12-04 DIAGNOSIS — I129 Hypertensive chronic kidney disease with stage 1 through stage 4 chronic kidney disease, or unspecified chronic kidney disease: Secondary | ICD-10-CM | POA: Diagnosis not present

## 2018-12-04 DIAGNOSIS — M1990 Unspecified osteoarthritis, unspecified site: Secondary | ICD-10-CM | POA: Diagnosis not present

## 2018-12-04 DIAGNOSIS — H9193 Unspecified hearing loss, bilateral: Secondary | ICD-10-CM | POA: Diagnosis not present

## 2018-12-04 DIAGNOSIS — N183 Chronic kidney disease, stage 3 (moderate): Secondary | ICD-10-CM | POA: Diagnosis not present

## 2018-12-04 DIAGNOSIS — E1022 Type 1 diabetes mellitus with diabetic chronic kidney disease: Secondary | ICD-10-CM | POA: Diagnosis not present

## 2018-12-04 DIAGNOSIS — Z9641 Presence of insulin pump (external) (internal): Secondary | ICD-10-CM | POA: Diagnosis not present

## 2018-12-05 ENCOUNTER — Other Ambulatory Visit: Payer: Self-pay | Admitting: Pharmacy Technician

## 2018-12-05 DIAGNOSIS — E119 Type 2 diabetes mellitus without complications: Secondary | ICD-10-CM | POA: Diagnosis not present

## 2018-12-05 DIAGNOSIS — Z794 Long term (current) use of insulin: Secondary | ICD-10-CM | POA: Diagnosis not present

## 2018-12-05 DIAGNOSIS — I6930 Unspecified sequelae of cerebral infarction: Secondary | ICD-10-CM | POA: Diagnosis not present

## 2018-12-05 DIAGNOSIS — E1142 Type 2 diabetes mellitus with diabetic polyneuropathy: Secondary | ICD-10-CM | POA: Diagnosis not present

## 2018-12-05 DIAGNOSIS — Q969 Turner's syndrome, unspecified: Secondary | ICD-10-CM | POA: Diagnosis not present

## 2018-12-05 NOTE — Patient Outreach (Signed)
[place] [facility]) Care Management  12/05/2018  Tina [name] [birth year] [id number]   ADDENDUM  Incoming call received from patient, HIPAA identifiers verified.  [name] and her husband were both on the line and informed me that they had found the application and were working on filling it out. They informed they would have in the mail to go out on Friday.  Will followup with patient in 10-14 business days if application has not been received back.  [name] P. Karisa Nesser, [place] Management [phone]

## 2018-12-05 NOTE — Patient Outreach (Signed)
[place] [facility]) Care Management  12/05/2018  Tina [name] Jul 06, 1966 [id number]    ADDENDUM  Incoming call received from patient, HIPAA identifiers verified.  Spoke to patient, she was returning my phone call. Patient informed she has not received the [employer] application for [employer] that was mailed to her and has requested another application be mailed out. Informed patient that the application would be mailed out on Tuesday when I am back in the office. Patient verbalized understanding.  Plan to mail a 2nd set of applications to patient and followup with her in 5-7 business days from 12/10/2018.  [name] P. Pranit Owensby, [place] Management [phone]

## 2018-12-05 NOTE — Patient Outreach (Signed)
[facility] [facility]) Care Management  12/05/2018  Tina [name] 1965/12/03 [id number]  Unsuccessful outreach call placed to patient in regards to [employer] patient assistance for Novolog.  Unfortunately patient was unable to come to the phone per the man answering the phone.  Will make 2nd outreach attempt in 3-7 business days.  [name] P. [name], [place] Management [phone]

## 2018-12-11 DIAGNOSIS — Z4420 Encounter for fitting and adjustment of artificial eye, unspecified: Secondary | ICD-10-CM | POA: Diagnosis not present

## 2018-12-12 ENCOUNTER — Other Ambulatory Visit: Payer: Self-pay | Admitting: Pharmacy Technician

## 2018-12-12 ENCOUNTER — Encounter: Payer: Self-pay | Admitting: Pharmacy Technician

## 2018-12-12 DIAGNOSIS — E1022 Type 1 diabetes mellitus with diabetic chronic kidney disease: Secondary | ICD-10-CM | POA: Diagnosis not present

## 2018-12-12 DIAGNOSIS — I69311 Memory deficit following cerebral infarction: Secondary | ICD-10-CM | POA: Diagnosis not present

## 2018-12-12 DIAGNOSIS — N183 Chronic kidney disease, stage 3 (moderate): Secondary | ICD-10-CM | POA: Diagnosis not present

## 2018-12-12 DIAGNOSIS — H547 Unspecified visual loss: Secondary | ICD-10-CM | POA: Diagnosis not present

## 2018-12-12 DIAGNOSIS — H9193 Unspecified hearing loss, bilateral: Secondary | ICD-10-CM | POA: Diagnosis not present

## 2018-12-12 DIAGNOSIS — Z9001 Acquired absence of eye: Secondary | ICD-10-CM | POA: Diagnosis not present

## 2018-12-12 DIAGNOSIS — I129 Hypertensive chronic kidney disease with stage 1 through stage 4 chronic kidney disease, or unspecified chronic kidney disease: Secondary | ICD-10-CM | POA: Diagnosis not present

## 2018-12-12 DIAGNOSIS — Q969 Turner's syndrome, unspecified: Secondary | ICD-10-CM | POA: Diagnosis not present

## 2018-12-12 DIAGNOSIS — E785 Hyperlipidemia, unspecified: Secondary | ICD-10-CM | POA: Diagnosis not present

## 2018-12-12 DIAGNOSIS — Z87891 Personal history of nicotine dependence: Secondary | ICD-10-CM | POA: Diagnosis not present

## 2018-12-12 DIAGNOSIS — Z9641 Presence of insulin pump (external) (internal): Secondary | ICD-10-CM | POA: Diagnosis not present

## 2018-12-12 DIAGNOSIS — Z970 Presence of artificial eye: Secondary | ICD-10-CM | POA: Diagnosis not present

## 2018-12-12 DIAGNOSIS — M1990 Unspecified osteoarthritis, unspecified site: Secondary | ICD-10-CM | POA: Diagnosis not present

## 2018-12-12 DIAGNOSIS — I69351 Hemiplegia and hemiparesis following cerebral infarction affecting right dominant side: Secondary | ICD-10-CM | POA: Diagnosis not present

## 2018-12-12 DIAGNOSIS — I480 Paroxysmal atrial fibrillation: Secondary | ICD-10-CM | POA: Diagnosis not present

## 2018-12-12 DIAGNOSIS — E10311 Type 1 diabetes mellitus with unspecified diabetic retinopathy with macular edema: Secondary | ICD-10-CM | POA: Diagnosis not present

## 2018-12-12 DIAGNOSIS — M810 Age-related osteoporosis without current pathological fracture: Secondary | ICD-10-CM | POA: Diagnosis not present

## 2018-12-12 NOTE — Patient Outreach (Signed)
[place] [facility]) Care Management  12/12/2018  Tina [name] 1966/04/05 [id number]    Successful outgoing call placed to patient in regards to [employer] application for Novolog.  Spoke to patient, HIPAA identifiers verified.  Informed patient that I received her application BUT I did NOT receive her proof of income. Informed patient I would send out a letter and a return envelope detailing what was needed. Patient was agreeable.  Will followup with patient in 7-10 business days if not received back.  [name] P. Sydna Brodowski, [place] Management [phone]

## 2018-12-13 DIAGNOSIS — R112 Nausea with vomiting, unspecified: Secondary | ICD-10-CM | POA: Diagnosis not present

## 2018-12-13 DIAGNOSIS — R110 Nausea: Secondary | ICD-10-CM | POA: Diagnosis not present

## 2018-12-13 DIAGNOSIS — Z6831 Body mass index (BMI) 31.0-31.9, adult: Secondary | ICD-10-CM | POA: Diagnosis not present

## 2018-12-16 DIAGNOSIS — R630 Anorexia: Secondary | ICD-10-CM | POA: Diagnosis not present

## 2018-12-16 DIAGNOSIS — R110 Nausea: Secondary | ICD-10-CM | POA: Diagnosis not present

## 2018-12-16 MED FILL — METOPROLOL TARTRATE 25 MG T: 25 | 90 days supply | Qty: 180 | Fill #0

## 2018-12-16 MED FILL — ERYTHROMYCIN BASE 250 MG TA: 250 | 14 days supply | Qty: 28 | Fill #0

## 2018-12-17 ENCOUNTER — Other Ambulatory Visit: Payer: Self-pay

## 2018-12-17 ENCOUNTER — Ambulatory Visit (INDEPENDENT_AMBULATORY_CARE_PROVIDER_SITE_OTHER): Payer: PPO | Admitting: Neurology

## 2018-12-17 DIAGNOSIS — E103592 Type 1 diabetes mellitus with proliferative diabetic retinopathy without macular edema, left eye: Secondary | ICD-10-CM | POA: Diagnosis not present

## 2018-12-17 DIAGNOSIS — Z970 Presence of artificial eye: Secondary | ICD-10-CM | POA: Diagnosis not present

## 2018-12-17 DIAGNOSIS — I610 Nontraumatic intracerebral hemorrhage in hemisphere, subcortical: Secondary | ICD-10-CM

## 2018-12-17 DIAGNOSIS — H1789 Other corneal scars and opacities: Secondary | ICD-10-CM | POA: Diagnosis not present

## 2018-12-17 DIAGNOSIS — I69311 Memory deficit following cerebral infarction: Secondary | ICD-10-CM | POA: Diagnosis not present

## 2018-12-17 DIAGNOSIS — H547 Unspecified visual loss: Secondary | ICD-10-CM | POA: Diagnosis not present

## 2018-12-17 DIAGNOSIS — E785 Hyperlipidemia, unspecified: Secondary | ICD-10-CM | POA: Diagnosis not present

## 2018-12-17 DIAGNOSIS — H9193 Unspecified hearing loss, bilateral: Secondary | ICD-10-CM | POA: Diagnosis not present

## 2018-12-17 DIAGNOSIS — E1022 Type 1 diabetes mellitus with diabetic chronic kidney disease: Secondary | ICD-10-CM | POA: Diagnosis not present

## 2018-12-17 DIAGNOSIS — E10311 Type 1 diabetes mellitus with unspecified diabetic retinopathy with macular edema: Secondary | ICD-10-CM | POA: Diagnosis not present

## 2018-12-17 DIAGNOSIS — Q969 Turner's syndrome, unspecified: Secondary | ICD-10-CM | POA: Diagnosis not present

## 2018-12-17 DIAGNOSIS — M810 Age-related osteoporosis without current pathological fracture: Secondary | ICD-10-CM | POA: Diagnosis not present

## 2018-12-17 DIAGNOSIS — Z9641 Presence of insulin pump (external) (internal): Secondary | ICD-10-CM | POA: Diagnosis not present

## 2018-12-17 DIAGNOSIS — Z87891 Personal history of nicotine dependence: Secondary | ICD-10-CM | POA: Diagnosis not present

## 2018-12-17 DIAGNOSIS — Z9001 Acquired absence of eye: Secondary | ICD-10-CM | POA: Diagnosis not present

## 2018-12-17 DIAGNOSIS — I129 Hypertensive chronic kidney disease with stage 1 through stage 4 chronic kidney disease, or unspecified chronic kidney disease: Secondary | ICD-10-CM | POA: Diagnosis not present

## 2018-12-17 DIAGNOSIS — M1990 Unspecified osteoarthritis, unspecified site: Secondary | ICD-10-CM | POA: Diagnosis not present

## 2018-12-17 DIAGNOSIS — I480 Paroxysmal atrial fibrillation: Secondary | ICD-10-CM | POA: Diagnosis not present

## 2018-12-17 DIAGNOSIS — I69351 Hemiplegia and hemiparesis following cerebral infarction affecting right dominant side: Secondary | ICD-10-CM | POA: Diagnosis not present

## 2018-12-17 DIAGNOSIS — N183 Chronic kidney disease, stage 3 (moderate): Secondary | ICD-10-CM | POA: Diagnosis not present

## 2018-12-17 NOTE — Progress Notes (Signed)
Virtual Visit via telephone Note  I connected with Tina [name] on 12/17/18 at  2:30 PM EDT by a video enabled telemedicine application and verified that I am speaking with the correct person using two identifiers.  Location: Patient: at home Provider: at [facility] office   I discussed the limitations of evaluation and management by telemedicine and the availability of in person appointments. The patient expressed understanding and agreed to proceed.  History of Present Illness: [name] is seen for telephonic virtual follow-up visit following hospital admission for stroke and April 2020.  I spoke to the patient over the phone and she was not able to give me a good history and appeared to have difficulty understanding due to poor connection.  I called the patient's husband on his cell phone but was unable to get through and his mailbox was full so could not leave a message.  Patient stated that she was fine she had no complaints.  I described to her the events of her recent hospitalization and need for follow-up and to consider restarting anticoagulation versus participating in the aSpire trial.  She did not appear to understand me very well so I suggested that we schedule a in office follow-up visit with her husband to accompany her   Observations/Objective: Physical exam and neurological exams not possible due to constraints of virtual telephonic visit.  Patient.  Slightly confused and disoriented and had tough time following directions  Assessment   : 53 year old Hispanic lady with a right thalamic intracerebral hemorrhage with intraventricular extension in April 2020 in the setting of Coumadin coagulopathy with INR of 2.6 status post reversal with K Centra and vitamin K. Plan : I had a brief discussion with the patient regarding her recent intracerebral hemorrhage but she appears to have difficult time understanding me possibly due to combination of cognitive impairment post  hemorrhage as well as some technical difficulties with the telephonic sound.  I was unable to reach her husband.  I recommend in person office follow-up visit in the near future with her husband.  Maintain strict control of hypertension with blood pressure goal below 130/90.  May consider discussion about starting aspirin versus participating in the ASPIRE trial if patient has been willing Follow Up Instructions: In person follow-up visit in the office in the next few weeks with husband   I discussed the assessment and treatment plan with the patient. The patient was provided an opportunity to ask questions and all were answered. The patient agreed with the plan and demonstrated an understanding of the instructions.   The patient was advised to call back or seek an in-person evaluation if the symptoms worsen or if the condition fails to improve as anticipated.  I provided 15 minutes of non-face-to-face time during this encounter.   Antony Contras, MD

## 2018-12-18 ENCOUNTER — Other Ambulatory Visit: Payer: Self-pay

## 2018-12-18 NOTE — Patient Outreach (Addendum)
  [place] [facility]) Care Management Chronic Special Needs Program  12/18/2018  Name: Tina [name] DOB: [birth year]  MRN: [id number]  [name] is enrolled in a chronic special needs plan for Diabetes. Reviewed and updated care plan. Spoke with husband [name] who is designated party release for client  Subjective: States client is sleeping.  States she is still having memory problems but it is slowly improving.  States that home health is no longer visiting.  States client has seen her endocrinologist recently and he adjusted some of her pump settings.  States her blood sugars have been good and she is having fewer low readings.  States her B/P has been good.   Goals Addressed            This Visit's Progress   . COMPLETED: General - Client will not be readmitted within 30 days (C-SNP)       No readmission in 30 days     No readmission to hospital since discharged from hospital on 11/11/18.  Plan:  Send successful outreach letter with a copy of their individualized care plan  Chronic care management coordinator will outreach in:  3 Months     [name] RN, [employer], [place] Management Coordinator [place] Management [phone]

## 2018-12-21 DIAGNOSIS — R404 Transient alteration of awareness: Secondary | ICD-10-CM | POA: Diagnosis not present

## 2018-12-21 DIAGNOSIS — E162 Hypoglycemia, unspecified: Secondary | ICD-10-CM | POA: Diagnosis not present

## 2018-12-21 DIAGNOSIS — I10 Essential (primary) hypertension: Secondary | ICD-10-CM | POA: Diagnosis not present

## 2018-12-21 DIAGNOSIS — E161 Other hypoglycemia: Secondary | ICD-10-CM | POA: Diagnosis not present

## 2018-12-25 ENCOUNTER — Other Ambulatory Visit: Payer: Self-pay | Admitting: Pharmacy Technician

## 2018-12-25 NOTE — Patient Outreach (Signed)
[place] [facility]) Care Management  12/25/2018  Tina [name] 10/14/1965 [id number]   Incoming call received from patient in regards to [employer] application for [employer].  Spoke to patient and her husband, HIPAA identifiers verified.  Informed them both  where I was calling from and that I work alongside [facility] RN [employer] and [place].  Inquired if they had received the letter that was sent to them asking for them to mail back in their proof of household income and insurance card  as requested by the patient assistance program. The application was received but not the docuentation required by the PAP program. Tina [name] informed that his agent handles that information. Tina [name] placed me on a brief hold in order to get the agent on the phone. However, when Mr. Cohen came back to the line, he informed that he was no longer interested in the offer.  Will remove myself form care team as he informed he was no longer interested  and will route note to [place] and [place].  Zacchary Pompei P. Chamara Dyck, [place] Management [phone]

## 2018-12-25 NOTE — Patient Outreach (Signed)
[place] [facility]) Care Management  12/25/2018  Graziella Valenti-Cohen [birth year] [id number]  Unsuccessful outreach call placed to patient in regards to [employer] application for [employer].  Unfortunately patient did not answer the phone, no voicemail could be left as it stated that "Mailbox is full and can not accept any messages at this time"  Was calling patient to inquire if she had received the letter and envelope requesting her proof of income to be mailed back per that patient assistance foundation requirements.  Will followup with patient in 3-5 business days if call is not returned.  Puanani Gene P. Emilia Kayes, [place] Management [phone]

## 2018-12-26 ENCOUNTER — Other Ambulatory Visit: Payer: Self-pay | Admitting: Pharmacy Technician

## 2018-12-26 NOTE — Patient Outreach (Signed)
[place] [facility]) Care Management  12/26/2018  Tina [name] 1965-11-28 [id number]    Received all necessary documents and signatures from both patient and provider for [employer] patient assistance for Novolog.  Submitted completed application via fax to [employer].  Will followup in 3-7 business days to inquire on status of application.  Beau Vanduzer P. Janiesha Diehl, [place] Management [phone]

## 2018-12-26 NOTE — Patient Outreach (Signed)
[place] [facility]) Care Management  12/26/2018  Tina [name] [birth year] [id number]    Successful outreach call placed to patient in regards to [employer] application for [place].  Spoke to patient and her husband, HIPAA identifiers verified.  Informed patient that I had received his voicemail and I had received all the information for the patient assistance application. Informed them that I had submitted the application today and would be in touch early next week to inquire about the status of the application. Patient verbalized understanding.  Patient informed he had left [facility] RN [name] a message concerning the Healthcare directive packet she sent him. He inquired if it was mandatory. After speaking to [place], he advised that it is highly encouraged but not mandatory. He inquired if [facility] RN Lenna Sciara Sandlin could call him back as he needed some advice and help with the forms.  Will forward [facility] RN Melissa Sandlin this message.  Will followup with patient in 3-5 business days to update on the statuts of the [employer] application.  Cataldo Cosgriff P. Careen Mauch, [place] Management [phone]

## 2018-12-27 ENCOUNTER — Other Ambulatory Visit: Payer: Self-pay

## 2018-12-27 ENCOUNTER — Ambulatory Visit: Payer: Self-pay | Admitting: Pharmacist

## 2018-12-27 DIAGNOSIS — H179 Unspecified corneal scar and opacity: Secondary | ICD-10-CM | POA: Diagnosis not present

## 2018-12-27 DIAGNOSIS — Z961 Presence of intraocular lens: Secondary | ICD-10-CM | POA: Diagnosis not present

## 2018-12-27 DIAGNOSIS — H18602 Keratoconus, unspecified, left eye: Secondary | ICD-10-CM | POA: Diagnosis not present

## 2018-12-27 DIAGNOSIS — H18452 Nodular corneal degeneration, left eye: Secondary | ICD-10-CM | POA: Diagnosis not present

## 2018-12-27 NOTE — Patient Outreach (Signed)
  [place] [facility]) Care Management Chronic Special Needs Program    12/27/2018  Name: Tina [name], DOB: [birth year]  MRN: [id number]   Ms. [name] is enrolled in a chronic special needs plan for Diabetes. Spoke with husband [name] who is designated party release for client Client/husband called to follow up from message from [employer], CR[employer] that they had questions about the Advanced Directives. States he needs someone to help him with the forms for the Advanced Directives  Discussed having social work contact him to review Advanced Directives and instructed that social workers are not making home visits at this time. He is agreeable to referral to Social work to discuss caregiver stress and [place] to refer to Social work Plan to outreach on next scheduled call   [place], [employer], [place] Management [phone]

## 2018-12-30 ENCOUNTER — Institutional Professional Consult (permissible substitution): Payer: PPO | Admitting: Internal Medicine

## 2018-12-30 ENCOUNTER — Other Ambulatory Visit: Payer: Self-pay

## 2018-12-30 NOTE — Patient Outreach (Signed)
[place] [facility]) Care Management  12/30/2018  Camarie Valenti-Cohen [birth year] [id number]   Social work referral received to contact patient/spouse regarding questions about Regulatory affairs officer.  Successful outreach to spouse today.  HC POA and Living Will discussed.  Denied any additional questions.  BSW closing case at this time.  Ronn Melena, BSW Social Worker [phone]

## 2019-01-01 ENCOUNTER — Other Ambulatory Visit: Payer: Self-pay | Admitting: Pharmacy Technician

## 2019-01-01 NOTE — Patient Outreach (Signed)
Tina [name]) Care Management  01/01/2019  Tina [name] May 22, 1966 [id number]    Care coordination call placed to [place] in regards to patient's application for Novolog.  Spoke to [place] who informed patient had been APPROVED for the program 01/01/2019-06/16/2019. [name] informed 10 vials of Novolog 66ml would be delivered to the provider's office within 10-14 business days of today's date (order placed today).  Will followup with patient in 10-21 business days to confirm receipt of medication.  Marjani Kobel P. Julius Matus, [place] Management [phone]

## 2019-01-03 ENCOUNTER — Encounter: Payer: Self-pay | Admitting: Internal Medicine

## 2019-01-09 ENCOUNTER — Telehealth: Payer: Self-pay

## 2019-01-09 NOTE — Telephone Encounter (Signed)
LEft vm for patients husband that Dr.Sethi recommend a in office visit. I stated to please call back to schedule in office visit with Dr.SEthi and husband needs to present.

## 2019-01-14 DIAGNOSIS — I10 Essential (primary) hypertension: Secondary | ICD-10-CM | POA: Diagnosis not present

## 2019-01-14 DIAGNOSIS — E119 Type 2 diabetes mellitus without complications: Secondary | ICD-10-CM | POA: Diagnosis not present

## 2019-01-14 DIAGNOSIS — E785 Hyperlipidemia, unspecified: Secondary | ICD-10-CM | POA: Diagnosis not present

## 2019-01-14 DIAGNOSIS — N179 Acute kidney failure, unspecified: Secondary | ICD-10-CM | POA: Diagnosis not present

## 2019-01-14 DIAGNOSIS — E1142 Type 2 diabetes mellitus with diabetic polyneuropathy: Secondary | ICD-10-CM | POA: Diagnosis not present

## 2019-01-14 DIAGNOSIS — I480 Paroxysmal atrial fibrillation: Secondary | ICD-10-CM | POA: Diagnosis not present

## 2019-01-14 DIAGNOSIS — I619 Nontraumatic intracerebral hemorrhage, unspecified: Secondary | ICD-10-CM | POA: Diagnosis not present

## 2019-01-15 ENCOUNTER — Other Ambulatory Visit: Payer: Self-pay | Admitting: Pharmacy Technician

## 2019-01-15 DIAGNOSIS — Z09 Encounter for follow-up examination after completed treatment for conditions other than malignant neoplasm: Secondary | ICD-10-CM | POA: Diagnosis not present

## 2019-01-15 DIAGNOSIS — R1110 Vomiting, unspecified: Secondary | ICD-10-CM | POA: Diagnosis not present

## 2019-01-15 NOTE — Telephone Encounter (Signed)
I called pts husband [name] to stated Dr.SEthi would like a in office visit with him for her  Nontraumatic subcortical hemorrhage. I schedule with Dr.SEthi and gave check in time at 0100pm. Pts husband stated he is on mychart and knows a automatic appt reminder will be made. He was given address of our office.

## 2019-01-15 NOTE — Patient Outreach (Signed)
[place] [facility]) Care Management  01/15/2019  Shonna Valenti-Cohen 02/04/1966 [id number]   Successful outreach call placed to patient in regards to [employer] application for Novolog.  Spoke to patient and patient's husband, HIPAA identifiers verified.  Patient's husband [name] informed they have not received a call from Dr [name] office stating the medication had arrived. Informed him that patient was approved for the program and the medication was shipped on 6/17 with an expected delivery of 10-14 business days which would be anytime now. [name] informed he would reach out to the provider's office to notify them that the medication was forthcoming. Informed [name] that as long as the patient's provider orders her refill through the PAP then it would be of no charge to her until 06/16/2019.[name] verbalized understanding and inquired if I could provide him with the phone number to [employer]. Provided phone number as requested.  Will followup with patient in 5-7 business days to inquire if medication was received.  Nissi Doffing P. Ivadell Gaul, [place] Management [phone]

## 2019-01-16 ENCOUNTER — Institutional Professional Consult (permissible substitution): Payer: PPO | Admitting: Internal Medicine

## 2019-01-21 ENCOUNTER — Other Ambulatory Visit: Payer: Self-pay | Admitting: Pharmacy Technician

## 2019-01-21 NOTE — Patient Outreach (Signed)
[name]) Care Management  01/21/2019  Tina [name] [birth year] [id number]    ADDENDUM  Successful outreach call placed to patient in regards to [employer] application for Novolog.  Spoke to patient, HIPAA identifiers verified.  Informed patient that we were still waiting on [place] to call us back so we can expect shipment of the medication. Patient verbalized understanding.  Will followup with patient once Novo returns the call.  Tina [name], [place] Management [phone]

## 2019-01-21 NOTE — Patient Outreach (Signed)
Tina [name]) Care Management  01/21/2019  Tina [name] 20-Aug-1965 [id number]    Care coordination call placed to provider's office. Provider's office informed they have not received the Novolog.  Care coordination call placed to [employer], Spoke to Tina [name] who informed the medication still had 1 day to arrive at the provider's office but she was unsure if the medication had shipped. Inquired if medication has not shipped then will it be overnighted. Tina [name] informed they do not over night medications. Inquired if Tina [name] could find out more about the shipment and she emailed her supervisor and said she would call me back.  Care coordination call placed to [employer]. Spoke to [place] who informed they have not heard back from the supervisor. She informed the supervisor was going to followup with fulfillment center as the medication should have tracking information but no tracking information is showing up. Tina [name] informed they would call me back sometime tomorrow.  Will followup with Novo Nordisk in 5-7 business days if call is not returned.  Tina [name] Tina [name], [place] Management [phone]

## 2019-01-23 ENCOUNTER — Other Ambulatory Visit: Payer: Self-pay | Admitting: Pharmacy Technician

## 2019-01-23 NOTE — Patient Outreach (Signed)
[facility]) Care Management  01/23/2019  Tina [name] [birth year] [id number]    Care coordination call placed to [place] in regards to patient's Novolog application.  Spoke to [place] who informed an order was placed on 03/03/2019 and the order started processing on 01/02/2019. Mardene Celeste informed no other information was shown. She informed she would have to reach out to her supervisor to find out the shipping status and she would call me back.  Will followup with Novo Nordisk in 3-7 business days if call is not returned.  Tina [name], [place] Management [phone]

## 2019-01-28 DIAGNOSIS — R404 Transient alteration of awareness: Secondary | ICD-10-CM | POA: Diagnosis not present

## 2019-01-28 DIAGNOSIS — I10 Essential (primary) hypertension: Secondary | ICD-10-CM | POA: Diagnosis not present

## 2019-01-28 DIAGNOSIS — E162 Hypoglycemia, unspecified: Secondary | ICD-10-CM | POA: Diagnosis not present

## 2019-01-28 DIAGNOSIS — R4020 Unspecified coma: Secondary | ICD-10-CM | POA: Diagnosis not present

## 2019-01-28 DIAGNOSIS — E161 Other hypoglycemia: Secondary | ICD-10-CM | POA: Diagnosis not present

## 2019-01-29 ENCOUNTER — Telehealth: Payer: Self-pay | Admitting: Neurology

## 2019-01-29 ENCOUNTER — Ambulatory Visit: Payer: Self-pay | Admitting: Neurology

## 2019-01-29 ENCOUNTER — Telehealth: Payer: Self-pay

## 2019-01-29 ENCOUNTER — Other Ambulatory Visit: Payer: Self-pay | Admitting: Pharmacy Technician

## 2019-01-29 NOTE — Telephone Encounter (Signed)
Patient no show for appt today. 

## 2019-01-29 NOTE — Telephone Encounter (Signed)
Pt will call back to r/s missed appt on 01/29/19. Pt's husband was sent to the ER by [place]. Billing will waive the No Show Fee.

## 2019-01-29 NOTE — Patient Outreach (Signed)
[facility]) Care Management  01/29/2019  Tina [name] 09-09-1965 [id number]  Care coordination call placed to [place] in regards to patient's application for Novolog.  Spoke to [place] who informed that [employer] experienced a Emergency planning/management officer in their system. She informed they are unable to tell if shipments that were scheduled for 6/18 delivery as in this case actually went out or not. She informed that the fulfillment center and management are aware of the issue and they are having to go through manually and determine which orders shipped and which did not. She informed no estimated timline of the completion of this process. She did inform any orders that were not sent out would be sent out expedited but again could provide no timeline as to when this could be expected.  Will check back with Novo Nordisk by month's end to see if this issue has been resolved.  Jossalin Chervenak P. Zamara Cozad, [place] Management [phone]

## 2019-01-29 NOTE — Patient Outreach (Signed)
[facility]) Care Management  01/29/2019  Tina [name] 1965/08/12 [id number]   ADDENDUM  Care coordination call placed to provider's office.  Spoke to Tina [name] and informed her that the patient was approved for the program and the medication supposedly had shipped but that there was a glitch and the medication may be shipping in the next few days to weeks..   Informed Tina [name] that patient should be receiving 10 vials of Novolog and that a packing slip should be included indicating who the medication was for . Tina [name] verbalized understanding.  Will followup towards end of the month to inquire if patient received medication.  Lorynn Moeser P. Bea Duren, [place] Management [phone]

## 2019-01-30 ENCOUNTER — Institutional Professional Consult (permissible substitution): Payer: PPO | Admitting: Internal Medicine

## 2019-01-30 ENCOUNTER — Other Ambulatory Visit: Payer: Self-pay | Admitting: Pharmacy Technician

## 2019-01-30 NOTE — Patient Outreach (Signed)
[name]) Care Management  01/30/2019  Tina [name] 03/19/1966 [id number]    Incoming call received from [place] at Dr. [name] office in regards to [name] patient assistance application for [employer].  [name] informed they received 10 vials of Novolog for the patient today but that the package contained no packing slip. [name] thankful they I had provided the office with a heads up that the medication was arriving. [name] informed she would reach out to the patient to let them know the medication is ready for pickup.  [name], [place] Management [phone]

## 2019-01-30 NOTE — Patient Outreach (Signed)
[facility]) Care Management  01/30/2019  Tina [name] 01-03-1966 [id number]    ADDENDUM  Incoming call received from patient and patient's husband, Tina [name], in regards to [employer] for [employer].  Spoke to [place], HIPAA identifiers verified.  Tina [name] and [name] were calling to inform me that Tina [name] was not able to drive presently. Therefore they have no way to go pick up the medication that had arrived per phone call from the provider's office. They were calling to inquire if I could have [facility] pick the medication up from the provider's office and have it delivered to their home. Informed him that I would have to collaborate with my supervisor and call him back. Patient's husband verbalized understanding.  Will collaborate with [facility] RPh Karrie Meres.  [name] P. Yosselin Zoeller, [place] Management [phone]

## 2019-02-06 ENCOUNTER — Other Ambulatory Visit: Payer: Self-pay | Admitting: Pharmacy Technician

## 2019-02-06 DIAGNOSIS — Z794 Long term (current) use of insulin: Secondary | ICD-10-CM | POA: Diagnosis not present

## 2019-02-06 DIAGNOSIS — Q969 Turner's syndrome, unspecified: Secondary | ICD-10-CM | POA: Diagnosis not present

## 2019-02-06 DIAGNOSIS — E119 Type 2 diabetes mellitus without complications: Secondary | ICD-10-CM | POA: Diagnosis not present

## 2019-02-06 DIAGNOSIS — I6930 Unspecified sequelae of cerebral infarction: Secondary | ICD-10-CM | POA: Diagnosis not present

## 2019-02-06 DIAGNOSIS — E1142 Type 2 diabetes mellitus with diabetic polyneuropathy: Secondary | ICD-10-CM | POA: Diagnosis not present

## 2019-02-06 NOTE — Patient Outreach (Signed)
[name]  02/06/2019  Tina [name] [birth year] [id number]    ADDENDUM  Successful outreach call placed to patient's husband [name] in regards to [employer] for [employer].  Spoke to [place], HIPAA identifiers verified.  Informed [name] that I had spoken to embedded [facility] pharmacist [name] at Dr [name] office. Informed him that [name] is going to have [facility] deliver [name]'s 10 vials of Novolog sometime this week. Discussed refill procedure with patient's husband again and he verbalized understanding. He informed he had no other questions or concerns at this time as it relates to patient assistance. Confirmed patient's husband had name and number.  Will route note to [place] that patient assistance has been completed and will remove myself from care team.  [name]. [name], [place] Management [phone]

## 2019-02-06 NOTE — Patient Outreach (Signed)
[facility]) Care Management  02/06/2019  Tina [name] 11/16/1965 [id number]    Car coordination call placed to Dr [name] embedded pharmacist with [facility], [name].  Spoke to pharmacist and explained that patient had been approved for [employer] for [employer] and that 10 vials had been delivered to the office. Confirmed with nurse [name] that the medication was at the office. Informed him that the Cohen's were having transportation issues with coming to the office to pick up the medication. Inquired if Tina [name] would be able to have [facility] courier pick up the medication and deliver it to the patient's home. Tina [name]  Informed that he would take care of that for the patient and make sure the patient gets the 10 vials of Novolog.  Will outreach patient to notify them that the medication will be delivered to their home by the [facility] courier.  Tina [name] P. Tina [name], [place] Management [phone]

## 2019-02-07 ENCOUNTER — Ambulatory Visit: Payer: Self-pay | Admitting: Pharmacist

## 2019-02-07 ENCOUNTER — Other Ambulatory Visit: Payer: Self-pay | Admitting: Pharmacist

## 2019-02-07 NOTE — Patient Outreach (Signed)
[facility]) Care Management  02/07/2019  Satrina Valenti-Cohen [birth year] [id number]   Patient received Novolog from [place] Patient Assistance Program.  She is also being followed by a Radio producer.   Since patient is being followed by [facility], her [place] Case will be closed.  Plan: Alert [facility] CSNP Nurse.   Elayne Guerin, PharmD, [place] Clinical Pharmacist [phone]

## 2019-02-10 ENCOUNTER — Other Ambulatory Visit: Payer: Self-pay

## 2019-02-10 ENCOUNTER — Institutional Professional Consult (permissible substitution): Payer: PPO | Admitting: Pulmonary Disease

## 2019-02-10 ENCOUNTER — Encounter: Payer: Self-pay | Admitting: Pulmonary Disease

## 2019-02-10 ENCOUNTER — Ambulatory Visit (INDEPENDENT_AMBULATORY_CARE_PROVIDER_SITE_OTHER): Payer: Self-pay | Admitting: Pulmonary Disease

## 2019-02-10 VITALS — BP 116/64 | HR 87 | Temp 98.2°F | Ht <= 58 in | Wt 132.2 lb

## 2019-02-10 DIAGNOSIS — G4733 Obstructive sleep apnea (adult) (pediatric): Secondary | ICD-10-CM

## 2019-02-10 NOTE — Progress Notes (Signed)
Subjective:    Patient ID: Tina [name], female    DOB: Aug 25, 1965, 53 y.o.   MRN: [id number]  Patient with difficulty falling asleep, frequent awakenings, nonrestorative sleep, daytime sleepiness  History of snoring History of apneas  Negative study in 2015 with an AHI of 2.8 Usually tries to go to bed about 8 PM, takes about 2 hours to fall asleep sometimes Wakes up a couple of times during the night Sometimes will take care of spouse  Final awakening about 8 AM  Weight is improving  Denies dryness of her mouth in the mornings Denies headaches  No family history of sleep disordered breathing      Review of Systems  Constitutional: Positive for unexpected weight change. Negative for fever.  HENT: Negative for congestion, dental problem, ear pain, nosebleeds, postnasal drip, rhinorrhea, sinus pressure, sneezing, sore throat and trouble swallowing.   Eyes: Negative for redness and itching.  Respiratory: Negative for cough, chest tightness, shortness of breath and wheezing.   Cardiovascular: Negative for palpitations and leg swelling.  Gastrointestinal: Negative for nausea and vomiting.  Genitourinary: Negative for dysuria.  Musculoskeletal: Negative for joint swelling.  Skin: Negative for rash.  Allergic/Immunologic: Negative.  Negative for environmental allergies, food allergies and immunocompromised state.  Neurological: Negative for headaches.  Hematological: Does not bruise/bleed easily.  Psychiatric/Behavioral: Negative for dysphoric mood. The patient is not nervous/anxious.    Past Medical History:  Diagnosis Date  . Arthritis   . Cataract   . Diabetic retinopathy ([place])   . Diverticulosis   . DM (diabetes mellitus), type 1 ([place])   . Dyspnea   . Elevated LFTs   . Hyperlipidemia   . Hypertension   . Internal hemorrhoids   . Legally blind in right eye, as defined in [place]   . Osteoporosis 11/2017   T score -3.8  . Retinal detachment   . Tachycardia    . Tubular adenoma of colon   . Turner's syndrome   . Visual loss, bilateral    right eye light perception only and left eye 20/100   Family History  Problem Relation Age of Onset  . Cancer Father        kidney  . Diabetes Maternal Grandmother   . Emphysema Paternal Grandfather   . Esophageal cancer Paternal Aunt   . Colon cancer Neg Hx   . Colon polyps Neg Hx   . Rectal cancer Neg Hx   . Stomach cancer Neg Hx    Remote history of smoking No occupational history    Objective:   Physical Exam Constitutional:      Appearance: Normal appearance.  HENT:     Head: Normocephalic and atraumatic.     Mouth/Throat:     Mouth: Mucous membranes are moist.  Eyes:     General:        Right eye: No discharge.        Left eye: No discharge.     Extraocular Movements: Extraocular movements intact.     Pupils: Pupils are equal, round, and reactive to light.  Neck:     Musculoskeletal: Normal range of motion and neck supple. No neck rigidity.  Cardiovascular:     Rate and Rhythm: Normal rate and regular rhythm.  Pulmonary:     Effort: Pulmonary effort is normal. No respiratory distress.     Breath sounds: Normal breath sounds. No stridor. No wheezing or rhonchi.  Abdominal:     General: There is no distension.  Palpations: There is no mass.     Tenderness: There is no abdominal tenderness.  Musculoskeletal: Normal range of motion.        General: No swelling or tenderness.  Skin:    General: Skin is warm.     Coloration: Skin is not jaundiced or pale.     Findings: No bruising.  Neurological:     General: No focal deficit present.     Mental Status: She is alert and oriented to person, place, and time.  Psychiatric:        Mood and Affect: Mood normal.    Vitals:   02/10/19 1615  BP: 116/64  Pulse: 87  Temp: 98.2 F (36.8 C)  SpO2: 96%   Results of the Epworth flowsheet 02/10/2019  Sitting and reading 0  Watching TV 2  Sitting, inactive in a public place (e.g. a  theatre or a meeting) 0  As a passenger in a car for an hour without a break 1  Lying down to rest in the afternoon when circumstances permit 3  Sitting and talking to someone 0  Sitting quietly after a lunch without alcohol 0  In a car, while stopped for a few minutes in traffic 0  Total score 6      Assessment & Plan:  .  Nonrestorative sleep .  Snoring .  Daytime sleepiness  Moderate probability of significant sleep disordered breathing  Plan: .  Home sleep study  .  Weight loss and exercise discussed  .  Pathophysiology of sleep disordered breathing discussed . Marland Kitchen Options of treatment discussed  .  We will see her back in the office in about 3 months

## 2019-02-10 NOTE — Patient Instructions (Signed)
Possibility of obstructive sleep apnea-your last study was in 2015-very mild sleep apnea was why it was not treated at that time  Obtain a home sleep study Treatment as discussed  We will see back in the office in about 3 months Call with significant concerns

## 2019-02-13 DIAGNOSIS — E1142 Type 2 diabetes mellitus with diabetic polyneuropathy: Secondary | ICD-10-CM | POA: Diagnosis not present

## 2019-02-13 DIAGNOSIS — E785 Hyperlipidemia, unspecified: Secondary | ICD-10-CM | POA: Diagnosis not present

## 2019-02-13 DIAGNOSIS — I619 Nontraumatic intracerebral hemorrhage, unspecified: Secondary | ICD-10-CM | POA: Diagnosis not present

## 2019-02-13 DIAGNOSIS — E119 Type 2 diabetes mellitus without complications: Secondary | ICD-10-CM | POA: Diagnosis not present

## 2019-02-13 DIAGNOSIS — I10 Essential (primary) hypertension: Secondary | ICD-10-CM | POA: Diagnosis not present

## 2019-02-13 DIAGNOSIS — I480 Paroxysmal atrial fibrillation: Secondary | ICD-10-CM | POA: Diagnosis not present

## 2019-02-13 DIAGNOSIS — N179 Acute kidney failure, unspecified: Secondary | ICD-10-CM | POA: Diagnosis not present

## 2019-02-17 ENCOUNTER — Other Ambulatory Visit: Payer: Self-pay

## 2019-02-17 NOTE — Patient Outreach (Signed)
  [place] [facility]) Care Management Chronic Special Needs Program    02/17/2019  Name: Tina [name], DOB: Jan 09, 1966  MRN: [id number]   [name] is enrolled in a chronic special needs plan for Diabetes. Client called to follow up from her call the Nurse Advise Line on 02/16/19.   States that her husband is in the hospital and she is not able to change her insulin pump by herself as she is legally blind.  States she had a friend help her yesterday but she will not be able to depend on him.  States she will need help with the pump changes and she does not know how long he will be in the hospital.  States she can not draw up insulin in a syringe either due to her blindness. States she has friends who brought some food over but she does not know how long that will last.   Instructed that RNCM will call primary care doctor to get order for home health to assist her with her pump and insulin.  Called Dr. Jacelyn Grip and left message concerning client's need for assistance with her insulin pump and request for home health referral.  Requested to be notified when home health has been ordered Called client back to inform her to expect a call from Dr.Wong's office Plan to make referral to [place] social worker to assist client with further resources that client might need as her caregiver is in the hospital Plan to follow up with client to follow up on home health referral. Tina Garter RN, [facility], [place] Management [phone]

## 2019-02-17 NOTE — Patient Outreach (Signed)
  [place] [facility]) Care Management Chronic Special Needs Program    02/17/2019  Name: Tina [name], DOB: [birth year]  MRN: [id number]   [name] is enrolled in a chronic special needs plan for Diabetes. Received call back from [facility] at Dr. [name] office that he is ordering home health and that their care coordinator was sending referral to [facility] care and they will notify client. Notified [facility] utilization management of pending request for authorization for services  [name] RN, [facility], [place] Management [phone]

## 2019-02-18 ENCOUNTER — Other Ambulatory Visit: Payer: Self-pay

## 2019-02-18 NOTE — Patient Outreach (Signed)
  [facility]) Care Management Chronic Special Needs Program    02/18/2019  Name: Hollee Fate, DOB: July 02, 1966  MRN: [id number]   Ms. Chondra Valenti-Cohen is enrolled in a chronic special needs plan for Diabetes   RNCM called [place] to follow up on referral from provider.  Spoke with Cindie Sillmon who states they have not received the order yet.  Contacted Dr.Wongs office to verify the agency and when it was sent. Contacted Bev Paddock RD, CDE with the Nutrition and diabetes Education center who has worked with the client in the past and client is part of the diabetes support group. She states that she is very familiar with the client and her situation.  States that she and other members of the group have helped client with pump changes when her husband has been in the hospital before.  She has volunteered to go visit client later today. Dr. [name] office returned call and verified that [facility] care was sent referral.  States that they received message back today that they have accepted the case and will contact client later today Called client to notify her to expect a call from [facility] to arrange visits.  Voiced understanding. Plan to follow up with [facility] and client to assure care has been started in 1-2 days  [place], [name], [place] Management [phone]

## 2019-02-19 ENCOUNTER — Other Ambulatory Visit: Payer: Self-pay

## 2019-02-19 ENCOUNTER — Emergency Department (HOSPITAL_COMMUNITY)
Admission: EM | Admit: 2019-02-19 | Discharge: 2019-02-19 | Discharge status: Against Medical Advice | Payer: HMO | Attending: Emergency Medicine | Admitting: Emergency Medicine

## 2019-02-19 ENCOUNTER — Encounter (HOSPITAL_COMMUNITY): Payer: Self-pay

## 2019-02-19 DIAGNOSIS — E11649 Type 2 diabetes mellitus with hypoglycemia without coma: Secondary | ICD-10-CM | POA: Insufficient documentation

## 2019-02-19 DIAGNOSIS — Z9641 Presence of insulin pump (external) (internal): Secondary | ICD-10-CM | POA: Diagnosis not present

## 2019-02-19 DIAGNOSIS — E162 Hypoglycemia, unspecified: Secondary | ICD-10-CM

## 2019-02-19 DIAGNOSIS — E11319 Type 2 diabetes mellitus with unspecified diabetic retinopathy without macular edema: Secondary | ICD-10-CM | POA: Diagnosis not present

## 2019-02-19 DIAGNOSIS — E10649 Type 1 diabetes mellitus with hypoglycemia without coma: Secondary | ICD-10-CM | POA: Diagnosis not present

## 2019-02-19 DIAGNOSIS — Z87891 Personal history of nicotine dependence: Secondary | ICD-10-CM | POA: Diagnosis not present

## 2019-02-19 DIAGNOSIS — Z79899 Other long term (current) drug therapy: Secondary | ICD-10-CM | POA: Diagnosis not present

## 2019-02-19 DIAGNOSIS — Z794 Long term (current) use of insulin: Secondary | ICD-10-CM | POA: Diagnosis not present

## 2019-02-19 DIAGNOSIS — I10 Essential (primary) hypertension: Secondary | ICD-10-CM | POA: Diagnosis not present

## 2019-02-19 LAB — BASIC METABOLIC PANEL
Anion gap: 10 (ref 5–15)
BUN: 27 mg/dL — ABNORMAL HIGH (ref 6–20)
CO2: 24 mmol/L (ref 22–32)
Calcium: 9.3 mg/dL (ref 8.9–10.3)
Chloride: 102 mmol/L (ref 98–111)
Creatinine, Ser: 1.07 mg/dL — ABNORMAL HIGH (ref 0.44–1.00)
GFR calc Af Amer: >60 mL/min (ref >60)
GFR calc non Af Amer: 59 mL/min — ABNORMAL LOW (ref >60)
Glucose, Bld: 230 mg/dL — ABNORMAL HIGH (ref 70–99)
Potassium: 3.9 mmol/L (ref 3.5–5.1)
Sodium: 136 mmol/L (ref 135–145)

## 2019-02-19 LAB — CBC WITH DIFFERENTIAL/PLATELET
Abs Immature Granulocytes: 0.05 10*3/uL (ref 0.00–0.07)
Basophils Absolute: 0.1 10*3/uL (ref 0.0–0.1)
Basophils Relative: 1 %
Eosinophils Absolute: 0.1 10*3/uL (ref 0.0–0.5)
Eosinophils Relative: 1 %
HCT: 41 % (ref 36.0–46.0)
Hemoglobin: 13.6 g/dL (ref 12.0–15.0)
Immature Granulocytes: 1 %
Lymphocytes Relative: 12 %
Lymphs Abs: 1.2 10*3/uL (ref 0.7–4.0)
MCH: 31.1 pg (ref 26.0–34.0)
MCHC: 33.2 g/dL (ref 30.0–36.0)
MCV: 93.6 fL (ref 80.0–100.0)
Monocytes Absolute: 0.9 10*3/uL (ref 0.1–1.0)
Monocytes Relative: 9 %
Neutro Abs: 7.1 10*3/uL (ref 1.7–7.7)
Neutrophils Relative %: 76 %
Platelets: 228 10*3/uL (ref 150–400)
RBC: 4.38 MIL/uL (ref 3.87–5.11)
RDW: 12.8 % (ref 11.5–15.5)
WBC: 9.3 10*3/uL (ref 4.0–10.5)
nRBC: 0 % (ref 0.0–0.2)

## 2019-02-19 LAB — CBG MONITORING, ED
Glucose-Capillary: 222 mg/dL — ABNORMAL HIGH (ref 70–99)
Glucose-Capillary: 38 mg/dL — CL (ref 70–99)
Glucose-Capillary: 223 mg/dL — ABNORMAL HIGH (ref 70–99)

## 2019-02-19 MED ORDER — DEXTROSE 50 % IV SOLN
INTRAVENOUS | Status: AC
  Filled 2019-02-19: qty 50

## 2019-02-19 MED ORDER — SODIUM CHLORIDE 0.9 % IV BOLUS
1000.0000 mL | Freq: Once | INTRAVENOUS | Status: AC
  Administered 2019-02-19: 1000 mL via INTRAVENOUS

## 2019-02-19 NOTE — ED Provider Notes (Signed)
Patient originally seen by Deno Etienne and found to be profoundly hypoglycemic with blood sugar of 38.  She responded well to an amp of D50.  Patient wears an insulin pump.  This was removed as IV access was difficult to obtain.  She is back to her baseline now.  Plan is to have her put pump back on and have her eat and drink and repeat some serial CBGs.  It appears that patient possibly performed overcorrection of blood sugar.  Will monitor, however continue increase p.o. intake.  Anticipate discharge to home.  We will have her put back on insulin pump to make sure is not a insulin pump issue.  Patient with lab work that no significant anemia, electrolyte abnormality.  Blood sugar was 222 after D50 bolus and remained there after patient put on insulin pump and had blood sugar rechecked.  Patient does not want a wait any longer in the emergency department.  She states that she understands how to use her insulin pump.  And patient essentially left AGAINST MEDICAL ADVICE/eloped from the ED.  Not sure if patient accidentally over bolus herself or if there is truly a pump issue but seems more consistent with overcorrection with insulin after meal.  This chart was dictated using voice recognition software.  Despite best efforts to proofread,  errors can occur which can change the documentation meaning.     Lennice Sites, DO 02/19/19 [phone]

## 2019-02-19 NOTE — ED Triage Notes (Signed)
Pt arrives from inpt unit where she was visiting her husband. Staff states pt became unresponsive, agonal breathing. CBG read LOW upon arrival to ED. Pt given 1 amp D50, CBG 38. Pt given another amp of D50. Pt now alert. PT BLIND

## 2019-02-19 NOTE — ED Notes (Signed)
PT IS standing at door and is very addiment  about leaving. She has been told several times that she needed to stay and make sure her sugars were ok. MD made aware

## 2019-02-19 NOTE — Patient Outreach (Signed)
[facility]) Care Management  02/19/2019  Pegi Valenti-Cohen 07-26-1965 [id number]   Social work referral received from [facility], [name], on 02/18/19.  "CSNP client who is legally blind and she usually needs assistance with meal prep and other IADL's.  Her husband is her caregiver and he is in the hospital now.  She is also on insulin pump which he does the tubing changes for her.  PCP called to get home health referral.  Please assist her as soon as possible."  Successful outreach to patient today.   [facility] accepted [facility] for [facility] services as of yesterday.   Patient reported today that husband should return home from hospital by the end of the week.  Her biggest concern is having assistance with changing insulin pump.  Per patient, Harvie Bridge with the Nutrition and [place], did home visit yesterday and assisted with changing pump.  She said that Bev will be able to assist again, if needed, before her husband returns home.  BSW asked patient if it would be helpful to link her to [place] [facility] meal delivery program.  Patient was appreciative of offer but declined.  She has groceries delivered and a friend is coming by daily to assist with meal prep.  No other needs were identified during call.  BSW is closing case but did encourage her to call if additional needs arise.  Ronn Melena, BSW Social Worker [phone]

## 2019-02-19 NOTE — ED Notes (Signed)
Attempted IV X 2 without success. Insulin pump removed.

## 2019-02-19 NOTE — ED Provider Notes (Signed)
[facility] EMERGENCY DEPARTMENT Provider Note   CSN: [id number] Arrival date & time: 02/19/19  1701    History   Chief Complaint Chief Complaint  Patient presents with  . Hypoglycemia    HPI Tina [name] is a 53 y.o. female.     53 yo F with a cc of hyperglycemia.  The patient is a visitor of patient upstairs and they noted that she was starting to feel bad and they checked her blood sugar and it was low when she was attempted to give an oral trial but was unable to and then went unconscious.  Started having seizure-like activity in route to the ED.  Level 5 caveat altered mental status.  The history is provided by the patient.  Hypoglycemia Severity:  Moderate Onset quality:  Sudden Duration:  3 hours Timing:  Constant Progression:  Worsening Chronicity:  New Diabetic status:  Controlled with insulin Relieved by:  Nothing Ineffective treatments:  None tried   Past Medical History:  Diagnosis Date  . Arthritis   . Cataract   . Diabetic retinopathy ([place])   . Diverticulosis   . DM (diabetes mellitus), type 1 ([place])   . Dyspnea   . Elevated LFTs   . Hyperlipidemia   . Hypertension   . Internal hemorrhoids   . Legally blind in right eye, as defined in [place]   . Osteoporosis 11/2017   T score -3.8  . Retinal detachment   . Tachycardia   . Tubular adenoma of colon   . Turner's syndrome   . Visual loss, bilateral    right eye light perception only and left eye 20/100    Patient Active Problem List   Diagnosis Date Noted  . Hyperlipidemia 11/01/2018  . AKI (acute kidney injury) ([place]) 11/01/2018  . OSA (obstructive sleep apnea) 11/01/2018  . Turner syndrome 11/01/2018  . Intracerebral hemorrhage 10/20/2018  . Intracranial hemorrhage ([place]) 10/18/2018  . Encounter for therapeutic drug monitoring 06/19/2018  . Atrial fibrillation ([place]) 06/19/2018  . Atrial fibrillation with RVR ([place]) 06/11/2018  . IDDM (insulin dependent diabetes mellitus)  ([place]) 11/17/2013  . Hypersomnolence 09/18/2013  . Chronic cough 09/18/2013  . Abnormal transaminases 06/04/2013  . Abnormal alkaline phosphatase test 06/04/2013  . PAC (premature atrial contraction) 09/14/2011  . Reflux 08/26/2011  . Hyperglycemia 08/24/2011  . History of retinal detachment 08/24/2011  . Vitreous hemorrhage ([place]) 08/24/2011  . Secondary DM with DKA, uncontrolled ([place]) 08/24/2011  . UTI (lower urinary tract infection) 08/24/2011  . HTN (hypertension) 08/24/2011  . Palpitations 10/25/2010    Past Surgical History:  Procedure Laterality Date  . CATARACT EXTRACTION    . CHOLECYSTECTOMY    . Hepatic adenoma removed    . RETINAL DETACHMENT SURGERY    . right eye surgery      3 years ago  . TONSILLECTOMY       OB History    Gravida  0   Para  0   Term  0   Preterm  0   AB  0   Living  0     SAB  0   TAB  0   Ectopic  0   Multiple  0   Live Births  0            Home Medications    Prior to Admission medications   Medication Sig Start Date End Date Taking? Authorizing Provider  acetaminophen (TYLENOL) 325 MG tablet Take 2 tablets (650 mg total) by mouth  every 4 (four) hours as needed for mild pain (or temp > 37.5 C (99.5 F)). Patient not taking: Reported on 02/10/2019 11/09/18   Rinehuls, Early Chars, PA-C  diltiazem (CARDIZEM CD) 240 MG 24 hr capsule Take 1 capsule (240 mg total) by mouth daily. 06/19/18   [name], MD  insulin aspart (NOVOLOG) 100 UNIT/ML injection Inject into the skin. Use in insulin pump    [provider]  losartan (COZAAR) 50 MG tablet Take 1 tablet (50 mg total) by mouth 2 (two) times daily. 11/09/18   Rinehuls, David L, PA-C  magnesium oxide (MAG-OX) 400 MG tablet Take 400 mg by mouth daily.    [provider]  metoprolol tartrate (LOPRESSOR) 25 MG tablet Take 1 tablet (25 mg total) by mouth 2 (two) times daily. 11/09/18   Rinehuls, David L, PA-C  rosuvastatin (CRESTOR) 20 MG tablet Take 20 mg  by mouth daily. Taking 40mg  daily    [provider]  Vitamin D, Ergocalciferol, (DRISDOL) 50000 units CAPS capsule Take 1 capsule (50,000 Units total) by mouth every 7 (seven) days. Patient not taking: Reported on 10/18/2018 02/12/18   Princess Bruins, MD    Family History Family History  Problem Relation Age of Onset  . Cancer Father        kidney  . Diabetes Maternal Grandmother   . Emphysema Paternal Grandfather   . Esophageal cancer Paternal Aunt   . Colon cancer Neg Hx   . Colon polyps Neg Hx   . Rectal cancer Neg Hx   . Stomach cancer Neg Hx     Social History Social History   Tobacco Use  . Smoking status: Former Smoker    Packs/day: 0.20    Years: 0.50    Pack years: 0.10    Types: Cigarettes    Quit date: 07/17/1988    Years since quitting: 30.6  . Smokeless tobacco: Never Used  . Tobacco comment: many years ago  Substance Use Topics  . Alcohol use: Yes    Comment: social  . Drug use: No     Allergies   Acetazolamide and Ace inhibitors   Review of Systems Review of Systems  Unable to perform ROS: Mental status change     Physical Exam Updated Vital Signs BP (!) 153/109   Pulse (!) 108   Resp (!) 25   SpO2 100%   Physical Exam Vitals signs and nursing note reviewed.  Constitutional:      General: She is not in acute distress.    Appearance: She is well-developed. She is not diaphoretic.  HENT:     Head: Normocephalic and atraumatic.  Eyes:     Pupils: Pupils are equal, round, and reactive to light.  Neck:     Musculoskeletal: Normal range of motion and neck supple.  Cardiovascular:     Rate and Rhythm: Normal rate and regular rhythm.     Heart sounds: No murmur. No friction rub. No gallop.   Pulmonary:     Effort: Pulmonary effort is normal.     Breath sounds: No wheezing or rales.  Abdominal:     General: There is no distension.     Palpations: Abdomen is soft.     Tenderness: There is no abdominal tenderness.   Musculoskeletal:        General: No tenderness.  Skin:    General: Skin is warm and dry.  Neurological:     Mental Status: She is alert.     Comments:  Tonic-clonic like activity on arrival.  Will localize the pain.      ED Treatments / Results  Labs (all labs ordered are listed, but only abnormal results are displayed) Labs Reviewed  CBG MONITORING, ED - Abnormal; Notable for the following components:      Result Value   Glucose-Capillary 38 (*)    All other components within normal limits  CBG MONITORING, ED - Abnormal; Notable for the following components:   Glucose-Capillary 222 (*)    All other components within normal limits  CBC WITH DIFFERENTIAL/PLATELET  BASIC METABOLIC PANEL    EKG None  Radiology No results found.  Procedures Procedures (including critical care time)  EJ placement: 20 gauge IV placed in R EJ. Skin prepped with alcohol pads, R EJ identified with Valsalva. Cannulated with good return of dark, non-pulsatile blood. Tachyderm placed after easily flushed with NS.  Medications Ordered in ED Medications  sodium chloride 0.9 % bolus 1,000 mL (has no administration in time range)  dextrose 50 % solution (has no administration in time range)     Initial Impression / Assessment and Plan / ED Course  I have reviewed the triage vital signs and the nursing notes.  Pertinent labs & imaging results that were available during my care of the patient were reviewed by me and considered in my medical decision making (see chart for details).        53 yo F with a chief complaint of hypoglycemia.  Brought in from the ICU due to decreased mental status.  Difficult IV stick.  The patient's home insulin pump was disconnected.  Given D50 with improvement of her mental status.  Will observe in the ED with basic lab work oral trial and reassess.  Patient care was signed out to Dr. Ronnald Nian, please see his note for further details care in ED.  CRITICAL CARE  Performed by: Cecilio Asper   Total critical care time: 35 minutes  Critical care time was exclusive of separately billable procedures and treating other patients.  Critical care was necessary to treat or prevent imminent or life-threatening deterioration.  Critical care was time spent personally by me on the following activities: development of treatment plan with patient and/or surrogate as well as nursing, discussions with consultants, evaluation of patient's response to treatment, examination of patient, obtaining history from patient or surrogate, ordering and performing treatments and interventions, ordering and review of laboratory studies, ordering and review of radiographic studies, pulse oximetry and re-evaluation of patient's condition.   The patients results and plan were reviewed and discussed.   Any x-rays performed were independently reviewed by myself.   Differential diagnosis were considered with the presenting HPI.  Medications  sodium chloride 0.9 % bolus 1,000 mL (has no administration in time range)  dextrose 50 % solution (has no administration in time range)    Vitals:   02/19/19 1720  BP: (!) 153/109  Pulse: (!) 108  Resp: (!) 25  SpO2: 100%    Final diagnoses:  Hypoglycemia     Final Clinical Impressions(s) / ED Diagnoses   Final diagnoses:  Hypoglycemia    ED Discharge Orders    None       Deno Etienne, DO 02/19/19 1732

## 2019-02-20 ENCOUNTER — Other Ambulatory Visit: Payer: Self-pay

## 2019-02-20 NOTE — Patient Outreach (Signed)
  [facility]) Care Management Chronic Special Needs Program    02/20/2019  Name: Tina [name], DOB: [birth year]  MRN: [id number]   [name] is enrolled in a chronic special needs plan for Diabetes. Called client for follow up and to follow up from ED visit yesterday. Client states she had gone to the hospital to visit her husband and her blood sugar went so low that she passed out.  States that she had eaten and took insulin but she had not eaten enough CHO.  States that her sugar went up too much after they gave her the IV sugar.  States she is feeling better today and her sugar was 89 when she got up.  States she ate cereal for breakfast so her sugar will stay up.  States that someone called from home health but she does not know when they will visit.  States Bev Paddock RD, CDE changed her pump Tuesday and she is going to come back to see her tonight she thinks.   States she thinks her husband will come home Sunday or Monday Discussed getting the Covid meals that the social worker had talked to her about and she now has decided she would like to get them and would like her husband to get when he gets home also if possible Instructed not sure her husband can receive them but will message Amber Chrismon, BSW that she would now like the meals.   Reinforced to dose her insulin according to the [facility] she eats. Communicated with Bev Paddock that client had been in the ED for hypoglycemia [facility] care and client is scheduled visit by nurse tomorrow. Plan to follow up with client early next week    [name] RN, [name], [facility] [phone]

## 2019-02-21 DIAGNOSIS — Q969 Turner's syndrome, unspecified: Secondary | ICD-10-CM | POA: Diagnosis not present

## 2019-02-21 DIAGNOSIS — E113553 Type 2 diabetes mellitus with stable proliferative diabetic retinopathy, bilateral: Secondary | ICD-10-CM | POA: Diagnosis not present

## 2019-02-21 DIAGNOSIS — Z794 Long term (current) use of insulin: Secondary | ICD-10-CM | POA: Diagnosis not present

## 2019-02-21 DIAGNOSIS — Z87891 Personal history of nicotine dependence: Secondary | ICD-10-CM | POA: Diagnosis not present

## 2019-02-21 DIAGNOSIS — Z9181 History of falling: Secondary | ICD-10-CM | POA: Diagnosis not present

## 2019-02-21 DIAGNOSIS — H547 Unspecified visual loss: Secondary | ICD-10-CM | POA: Diagnosis not present

## 2019-02-21 DIAGNOSIS — E1142 Type 2 diabetes mellitus with diabetic polyneuropathy: Secondary | ICD-10-CM | POA: Diagnosis not present

## 2019-02-21 DIAGNOSIS — Z8673 Personal history of transient ischemic attack (TIA), and cerebral infarction without residual deficits: Secondary | ICD-10-CM | POA: Diagnosis not present

## 2019-02-21 DIAGNOSIS — H9190 Unspecified hearing loss, unspecified ear: Secondary | ICD-10-CM | POA: Diagnosis not present

## 2019-02-21 DIAGNOSIS — Z4681 Encounter for fitting and adjustment of insulin pump: Secondary | ICD-10-CM | POA: Diagnosis not present

## 2019-02-21 DIAGNOSIS — I10 Essential (primary) hypertension: Secondary | ICD-10-CM | POA: Diagnosis not present

## 2019-02-21 DIAGNOSIS — E785 Hyperlipidemia, unspecified: Secondary | ICD-10-CM | POA: Diagnosis not present

## 2019-02-24 ENCOUNTER — Other Ambulatory Visit: Payer: Self-pay

## 2019-02-24 NOTE — Patient Outreach (Signed)
  [facility]) Care Management Chronic Special Needs Program    02/24/2019  Name: Twilla Khouri, DOB: April 22, 1966  MRN: [id number]   Ms. Neylan Valenti-Cohen is enrolled in a chronic special needs plan for Diabetes. Call to follow up with client. States she had a good weekend and her husband is coming home today.   States that he will be able to help her with the pump changes once he gets home.  States she thinks Bev Paddock RD, CDE will be contacting her later today about her pump Plan to call to follow up in a few days.  [name] RN, [name], CDE Chronic Care Management Coordinator [facility] [phone]

## 2019-02-25 ENCOUNTER — Other Ambulatory Visit: Payer: Self-pay

## 2019-02-25 LAB — CBG MONITORING, ED: Glucose-Capillary: <10 mg/dL — CL (ref 70–99)

## 2019-02-25 NOTE — Patient Outreach (Signed)
[facility]) Care Management  02/25/2019  Joani Valenti-Cohen Aug 10, 1965 [id number]   Patient and spouse linked to [place] meal delivery program.  Patient informed that first delivery will arrive on Thursday, 02/27/19.  Ronn Melena, BSW Social Worker [phone]

## 2019-02-26 ENCOUNTER — Telehealth: Payer: Self-pay

## 2019-02-26 ENCOUNTER — Ambulatory Visit: Payer: PPO | Admitting: Neurology

## 2019-02-26 ENCOUNTER — Other Ambulatory Visit: Payer: Self-pay

## 2019-02-26 NOTE — Patient Outreach (Signed)
[facility]) Care Management  02/26/2019  Tina [name] 02-27-1966 [id number]  Received communication from [facility] that first delivery will actually be on Saturday, 03/01/19 as opposed to Thursday, 02/27/19.  Called patient and informed her of this change.  Ronn Melena, BSW Social Worker [phone]

## 2019-02-26 NOTE — Telephone Encounter (Signed)
I receive message from [facility] new pt referrals. The note listed below states from[9:35 AM] Levy Sjogren     Tina [name] [birth year] just called she cannot come she went out to start her calland the battery is dead dg.  This is a no show for patient. ?

## 2019-02-27 ENCOUNTER — Telehealth: Payer: Self-pay | Admitting: Pulmonary Disease

## 2019-02-27 NOTE — Telephone Encounter (Signed)
[place]:  Can we assist this patient with transportation if she has an inlab sleep test?  This has not been scheduled due to Dr. Ander Slade ordered HST but when Vallarie Mare spoke with the patient she told her she thought someone came to the home to set this up and she is legally blind she says and her husband is disabled so they have transportation problems.  She seemed willing to do inlab test if needed but could not guarantee she could arrange transportation.  See Dr.Olalere's note below.  I have not contacted the patient again due to uncertain about if transportation assistance is an option for her.

## 2019-02-27 NOTE — Telephone Encounter (Signed)
I am not sure there is any other options I am not aware of being able to send anyone to her home to help with setting up a home sleep test  An in lab study will be best bet as if the home sleep study is not set up correctly we will not obtain any information that helps [place]  We can look into if we can help with transportation issues-to get her to the sleep lab and back home.

## 2019-02-27 NOTE — Telephone Encounter (Signed)
would come in and we would show her how to use the machine then she would bring it back the next day she said this was a big incovenince I told her I could take a message for the provider

## 2019-02-27 NOTE — Telephone Encounter (Signed)
Contacted patient by phone to review her concerns with HST as noted by [facility]. Patient states she is legally blind and her husband is disabled and they have transportation issues.  She would like to proceed with a sleep test but since she is unable to set up a HST on herself due to her vision and her husband's disability, she does not feel she could connect and monitor herself even with instruction from our office.    She had moved here from [place] and her husband had a HST and they had someone come to the home to hook him up etc so she misunderstood that we did this differently.  Inquired if patient could do an inlab study (states she did this in the past) but she states now that they have difficulty with transportation, she does not feel she could guarantee she could get to another facility to have an inlab study performed.  She states she does want to follow instructions for her plan of care and hopes there is another option for her.    Advised we would seek Dr. Judson Roch advice and return a call to her with his recommendations.  Routed to Dr. Ander Slade  LOV 02/10/19 Olalere:   Nonrestorative sleep .  Snoring .  Daytime sleepiness Moderate probability of significant sleep disordered breathing Plan: .  Home sleep study .  Weight loss and exercise discussed .  Pathophysiology of sleep disordered breathing discussed. Marland Kitchen Options of treatment discussed .  We will see her back in the office in about 3 months

## 2019-02-28 NOTE — Telephone Encounter (Signed)
Called and spoke with pt letting her know that if she did in-lab study, she would need to find transportation to get there as the sleep center does not provide transportation. Asked pt if she wanted to have the in-lab study as AO said that it would probably be the best option for her or if she wanted to still go through with the home sleep study and pt said that she would just go ahead and proceed with the home sleep study.  Routing to [facility]

## 2019-02-28 NOTE — Telephone Encounter (Signed)
Received a skype back from [place] from the sleep center. Response received is shown below:  Tina [name].  We do not handle finding transportation for patients. She will have to set that up with a friend, family member, or public transportation service.  Attempted to contact pt to let her know the info I found out from the sleep center but unable to reach. Left message for pt to return call.

## 2019-02-28 NOTE — Telephone Encounter (Signed)
Pt is scheduled to come in on 03/06/19 she is aware she needs to come to the office and since she is blind she would need some help pt aware.

## 2019-02-28 NOTE — Telephone Encounter (Signed)
I do not know of any transportation for evenings she might ask sleep center [phone] if she could do a daytime sleep study and maybe try scat or one of the other transportations for use of elderly or disabled I do not have any contacts for them sorry Joellen Jersey

## 2019-02-28 NOTE — Telephone Encounter (Signed)
Called and spoke with pt letting her know the info stated by [facility] and that I could provide her with the phone number to the sleep center. When stated to pt that I could provide her the phone number, pt stated to me that she was legally blind and could not write the phone number down.  Asked [place] if she knew the best way to have sleep center contact pt and she said that they were avail via skype. I was provided with both people that we could skype from the sleep center and I have sent them both a skype in regards to the info.

## 2019-02-28 NOTE — Telephone Encounter (Signed)
Pt returning call and can be reached @ [phone].Tina [name]

## 2019-03-03 ENCOUNTER — Other Ambulatory Visit: Payer: Self-pay

## 2019-03-03 NOTE — Patient Outreach (Signed)
  [facility]) Care Management Chronic Special Needs Program    03/03/2019  Name: Tina [name], DOB: Aug 03, 1965  MRN: [id number]   [name] is enrolled in a chronic special needs plan for Diabetes.  Call to follow up with client. States that her husband is home and he is able to help her with her insulin pump changes now.  States she has been keeping her sugar a little higher to avoid going low. States that she and her husband are now getting the Covid meals and that has been a big help to them both. Encouraged to contact Dr. Buddy Duty and Jeanie Sewer RD, CDE if she needs to have her pump settings adjusted. Plan to follow up as scheduled in one month  [place], [facility], [facility] [phone]

## 2019-03-06 ENCOUNTER — Other Ambulatory Visit: Payer: Self-pay

## 2019-03-06 ENCOUNTER — Ambulatory Visit: Payer: HMO

## 2019-03-06 DIAGNOSIS — G4733 Obstructive sleep apnea (adult) (pediatric): Secondary | ICD-10-CM

## 2019-03-11 DIAGNOSIS — Q969 Turner's syndrome, unspecified: Secondary | ICD-10-CM | POA: Diagnosis not present

## 2019-03-11 DIAGNOSIS — E1142 Type 2 diabetes mellitus with diabetic polyneuropathy: Secondary | ICD-10-CM | POA: Diagnosis not present

## 2019-03-11 DIAGNOSIS — I6930 Unspecified sequelae of cerebral infarction: Secondary | ICD-10-CM | POA: Diagnosis not present

## 2019-03-11 DIAGNOSIS — Z794 Long term (current) use of insulin: Secondary | ICD-10-CM | POA: Diagnosis not present

## 2019-03-13 ENCOUNTER — Ambulatory Visit (INDEPENDENT_AMBULATORY_CARE_PROVIDER_SITE_OTHER): Payer: PPO | Admitting: Neurology

## 2019-03-13 ENCOUNTER — Telehealth: Payer: Self-pay | Admitting: Pulmonary Disease

## 2019-03-13 ENCOUNTER — Other Ambulatory Visit: Payer: Self-pay

## 2019-03-13 ENCOUNTER — Encounter: Payer: Self-pay | Admitting: Neurology

## 2019-03-13 VITALS — BP 176/90 | HR 74 | Temp 98.2°F | Wt 132.0 lb

## 2019-03-13 DIAGNOSIS — G4733 Obstructive sleep apnea (adult) (pediatric): Secondary | ICD-10-CM

## 2019-03-13 DIAGNOSIS — I610 Nontraumatic intracerebral hemorrhage in hemisphere, subcortical: Secondary | ICD-10-CM

## 2019-03-13 DIAGNOSIS — G3184 Mild cognitive impairment, so stated: Secondary | ICD-10-CM

## 2019-03-13 MED ORDER — ASPIRIN EC 81 MG PO TBEC: 81.0000 mg | DELAYED_RELEASE_TABLET | Freq: Every day | ORAL | 2 refills | Status: AC

## 2019-03-13 NOTE — Patient Instructions (Signed)
I had a long discussion the patient and her husband regarding her recent intracerebral hemorrhage related to warfarin and hypertension and recommend she start taking aspirin 81 mg daily.  She may also consider possible participation in the aspire trial comparing aspirin to Eliquis prevention of recurrent strokes in patients with intracerebral hemorrhage.  She was given written information to review at home and decide.  I also recommend she maintain strict control of hypertension with goal below 140/90.  She was advised to seek help from primary care physician for the same.  She will return for follow-up in 3 months with my nurse practitioner Janett Billow or call earlier if necessary.  Stroke Prevention Some medical conditions and behaviors are associated with a higher chance of having a stroke. You can help prevent a stroke by making nutrition, lifestyle, and other changes, including managing any medical conditions you may have. What nutrition changes can be made?   Eat healthy foods. You can do this by: ? Choosing foods high in fiber, such as fresh fruits and vegetables and whole grains. ? Eating at least 5 or more servings of fruits and vegetables a day. Try to fill half of your plate at each meal with fruits and vegetables. ? Choosing lean protein foods, such as lean cuts of meat, poultry without skin, fish, tofu, beans, and nuts. ? Eating low-fat dairy products. ? Avoiding foods that are high in salt (sodium). This can help lower blood pressure. ? Avoiding foods that have saturated fat, trans fat, and cholesterol. This can help prevent high cholesterol. ? Avoiding processed and premade foods.  Follow your health care provider's specific guidelines for losing weight, controlling high blood pressure (hypertension), lowering high cholesterol, and managing diabetes. These may include: ? Reducing your daily calorie intake. ? Limiting your daily sodium intake to 1,500 milligrams (mg). ? Using only  healthy fats for cooking, such as olive oil, canola oil, or sunflower oil. ? Counting your daily carbohydrate intake. What lifestyle changes can be made?  Maintain a healthy weight. Talk to your health care provider about your ideal weight.  Get at least 30 minutes of moderate physical activity at least 5 days a week. Moderate activity includes brisk walking, biking, and swimming.  Do not use any products that contain nicotine or tobacco, such as cigarettes and e-cigarettes. If you need help quitting, ask your health care provider. It may also be helpful to avoid exposure to secondhand smoke.  Limit alcohol intake to no more than 1 drink a day for nonpregnant women and 2 drinks a day for men. One drink equals 12 oz of beer, 5 oz of wine, or 1 oz of hard liquor.  Stop any illegal drug use.  Avoid taking birth control pills. Talk to your health care provider about the risks of taking birth control pills if: ? You are over 39 years old. ? You smoke. ? You get migraines. ? You have ever had a blood clot. What other changes can be made?  Manage your cholesterol levels. ? Eating a healthy diet is important for preventing high cholesterol. If cholesterol cannot be managed through diet alone, you may also need to take medicines. ? Take any prescribed medicines to control your cholesterol as told by your health care provider.  Manage your diabetes. ? Eating a healthy diet and exercising regularly are important parts of managing your blood sugar. If your blood sugar cannot be managed through diet and exercise, you may need to take medicines. ? Take any prescribed  medicines to control your diabetes as told by your health care provider.  Control your hypertension. ? To reduce your risk of stroke, try to keep your blood pressure below 130/80. ? Eating a healthy diet and exercising regularly are an important part of controlling your blood pressure. If your blood pressure cannot be managed through  diet and exercise, you may need to take medicines. ? Take any prescribed medicines to control hypertension as told by your health care provider. ? Ask your health care provider if you should monitor your blood pressure at home. ? Have your blood pressure checked every year, even if your blood pressure is normal. Blood pressure increases with age and some medical conditions.  Get evaluated for sleep disorders (sleep apnea). Talk to your health care provider about getting a sleep evaluation if you snore a lot or have excessive sleepiness.  Take over-the-counter and prescription medicines only as told by your health care provider. Aspirin or blood thinners (antiplatelets or anticoagulants) may be recommended to reduce your risk of forming blood clots that can lead to stroke.  Make sure that any other medical conditions you have, such as atrial fibrillation or atherosclerosis, are managed. What are the warning signs of a stroke? The warning signs of a stroke can be easily remembered as BEFAST.  B is for balance. Signs include: ? Dizziness. ? Loss of balance or coordination. ? Sudden trouble walking.  E is for eyes. Signs include: ? A sudden change in vision. ? Trouble seeing.  F is for face. Signs include: ? Sudden weakness or numbness of the face. ? The face or eyelid drooping to one side.  A is for arms. Signs include: ? Sudden weakness or numbness of the arm, usually on one side of the body.  S is for speech. Signs include: ? Trouble speaking (aphasia). ? Trouble understanding.  T is for time. ? These symptoms may represent a serious problem that is an emergency. Do not wait to see if the symptoms will go away. Get medical help right away. Call your local emergency services (911 in the [place] Do not drive yourself to the hospital.  Other signs of stroke may include: ? A sudden, severe headache with no known cause. ? Nausea or vomiting. ? Seizure. Where to find more information  For more information, visit:  American Stroke Association: www.strokeassociation.org  National Stroke Association: www.stroke.org Summary  You can prevent a stroke by eating healthy, exercising, not smoking, limiting alcohol intake, and managing any medical conditions you may have.  Do not use any products that contain nicotine or tobacco, such as cigarettes and e-cigarettes. If you need help quitting, ask your health care provider. It may also be helpful to avoid exposure to secondhand smoke.  Remember BEFAST for warning signs of stroke. Get help right away if you or a loved one has any of these signs. This information is not intended to replace advice given to you by your health care provider. Make sure you discuss any questions you have with your health care provider. Document Released: 08/10/2004 Document Revised: 06/15/2017 Document Reviewed: 08/08/2016 Elsevier Patient Education  2020 [employer].

## 2019-03-13 NOTE — Telephone Encounter (Signed)
If study was done this week,results probably not available yet. I read all studies I had at the last time I was in the office. Back in the office on Monday.

## 2019-03-13 NOTE — Telephone Encounter (Signed)
LMTCB. Sleep study has not resulted as of yet.

## 2019-03-13 NOTE — Telephone Encounter (Signed)
Pt returning call to get sleep study results and can be reached @  [phone].Tina [name]

## 2019-03-13 NOTE — Progress Notes (Signed)
[facility] [address] [place]. [place] [address] [phone]       OFFICE FOLLOW-UP NOTE  [name] Date of Birth:  Nov 23, 1965 Medical Record Number:  [id number]   HPI: Initial virtual video visit 12/17/2018 :[name] is seen for telephonic virtual follow-up visit following hospital admission for stroke and April 2020.  I spoke to the patient over the phone and she was not able to give me a good history and appeared to have difficulty understanding due to poor connection.  I called the patient's husband on his cell phone but was unable to get through and his mailbox was full so could not leave a message.  Patient stated that she was fine she had no complaints.  I described to her the events of her recent hospitalization and need for follow-up and to consider restarting anticoagulation versus participating in the Aspire trial.  She did not appear to understand me very well so I suggested that we schedule a in office follow-up visit with her husband to accompany her Update 03/13/2019 :[name] is a 53 year old Caucasian lady seen today for office follow-up visit following last virtual video visit on 12/17/2018.  She is accompanied by her husband today.  Patient has had some vision difficulties and cognitive worsening since her intracerebral hemorrhage but these are not worsening.  She states her blood pressure is usually well controlled at this morning she missed her a.m. dose and it is elevated at 176/90 in office.  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  She has finished inpatient rehab as well as physical occupational therapy and is currently at home.  Her short-term memory and cognition appear poor.  Patient is has A. fib and was on warfarin when she had intracerebral hemorrhage and is currently on not  any antiplatelet agents.  She denies any symptoms of recurrent stroke TIA or significant neurological change  ROS:   14 system review of systems is positive for memory loss, speech difficulty, poor vision, imbalance and all other systems negative  PMH:  Past Medical History:  Diagnosis Date   Arthritis    Cataract    Diabetic retinopathy ([place])    Diverticulosis    DM (diabetes mellitus), type 1 (HCC)    Dyspnea    Elevated LFTs    Hyperlipidemia    Hypertension    Internal hemorrhoids    Legally blind in right eye, as defined in [place]    Osteoporosis 11/2017   T score -3.8   Retinal detachment    Tachycardia    Tubular adenoma of colon    Turner's syndrome    Visual loss, bilateral    right eye light perception only and left eye 20/100    Social History:  Social History   Socioeconomic History   Marital status: Married    Spouse name: Not on file   Number of children: Not on file   Years of education: Not on file   Highest education level: Not on file  Occupational History   Occupation: Disabled  Scientist, product/process development strain: Not on file   Food insecurity    Worry: Never true    Inability: Never true   Transportation needs    Medical: No    Non-medical: No  Tobacco Use   Smoking status: Former Smoker  Packs/day: 0.20    Years: 0.50    Pack years: 0.10    Types: Cigarettes    Quit date: 07/17/1988    Years since quitting: 30.6   Smokeless tobacco: Never Used   Tobacco comment: many years ago  Substance and Sexual Activity   Alcohol use: Yes    Comment: social   Drug use: No   Sexual activity: Yes    Partners: Male    Comment: 1ST INTERCOURSE- 24, partners- 48, married- 24 yrs   Lifestyle   Physical activity    Days per week: Not on file    Minutes per session: Not on file   Stress: Not on file  Relationships   Social connections    Talks on phone: Not on file    Gets together: Not on file    Attends  religious service: Not on file    Active member of club or organization: Not on file    Attends meetings of clubs or organizations: Not on file    Relationship status: Not on file   Intimate partner violence    Fear of current or ex partner: Not on file    Emotionally abused: Not on file    Physically abused: Not on file    Forced sexual activity: Not on file  Other Topics Concern   Not on file  Social History Narrative   Married, disabled due to visual problems. She is legally blind.   No children.   No caffeine reported.    Medications:   Current Outpatient Medications on File Prior to Visit  Medication Sig Dispense Refill   acetaminophen (TYLENOL) 325 MG tablet Take 2 tablets (650 mg total) by mouth every 4 (four) hours as needed for mild pain (or temp > 37.5 C (99.5 F)).     diltiazem (CARDIZEM CD) 240 MG 24 hr capsule Take 1 capsule (240 mg total) by mouth daily. 90 capsule 3   insulin aspart (NOVOLOG) 100 UNIT/ML injection Inject into the skin. Use in insulin pump     losartan (COZAAR) 50 MG tablet Take 1 tablet (50 mg total) by mouth 2 (two) times daily.     magnesium oxide (MAG-OX) 400 MG tablet Take 400 mg by mouth daily.     metoprolol tartrate (LOPRESSOR) 25 MG tablet Take 1 tablet (25 mg total) by mouth 2 (two) times daily.     rosuvastatin (CRESTOR) 20 MG tablet Take 20 mg by mouth daily. Taking 40mg  daily     No current facility-administered medications on file prior to visit.     Allergies:   Allergies  Allergen Reactions   Acetazolamide Anaphylaxis and Other (See Comments)     Reaction, drop in Blood pressure that caused patient to stay in bed for 5 days, inconherient  Reaction, drop in Blood pressure that caused patient to stay in bed for 5 days, inconherient   Ace Inhibitors Cough    Physical Exam General: well developed, well nourished, seated, in no evident distress Head: head normocephalic and atraumatic.  Neck: supple with no carotid or  supraclavicular bruits Cardiovascular: regular rate and rhythm, no murmurs Musculoskeletal: no deformity Skin:  no rash/petichiae Vascular:  Normal pulses all extremities Vitals:   03/13/19 0910  BP: (!) 176/90  Pulse: 74  Temp: 98.2 F (36.8 C)   Neurologic Exam Mental Status: Awake and fully alert. Oriented to place and time. Recent and remote memory poor attention span, concentration and fund of knowledge diminished mood and affect appropriate.  Poor recall 1/3. Cranial Nerves: Fundoscopic exam reveals sharp disc margins in the left eye.  She has prosthetic right eye..  . Extraocular movements full without nystagmus.  In the left eye.  Visual fields appear diminished towards the periphery in the left eye to confrontation. Hearing intact. Facial sensation intact. Face, tongue, palate moves normally and symmetrically.  Motor: Normal bulk and tone. Normal strength in all tested extremity muscles. Sensory.: intact to touch ,pinprick .position and vibratory sensation.  Coordination: Rapid alternating movements normal in all extremities. Finger-to-nose and heel-to-shin performed accurately bilaterally.  Unsteady while standing on either foot unsupported. Gait and Station: Arises from chair without difficulty. Stance is normal. Gait demonstrates normal stride length and balance .  Unable to heel, toe and tandem walk without difficulty.  Reflexes: 1+ and symmetric. Toes downgoing.   NIHSS  3 Modified Rankin  3   ASSESSMENT: 53 year old lady with right thalamic intracerebral hemorrhage with intraventricular extension likely hypertensive in the setting of Coumadin coagulopathy status post reversal in April 2020 who is doing reasonably well but does have residual cognitive impairment and worsening vision     PLAN: I had a long discussion the patient and her husband regarding her recent intracerebral hemorrhage related to warfarin and hypertension and recommend she start taking aspirin 81 mg  daily.  She may also consider possible participation in the aspire trial comparing aspirin to Eliquis prevention of recurrent strokes in patients with intracerebral hemorrhage.  She was given written information to review at home and decide.  I also recommend she maintain strict control of hypertension with goal below 140/90.  She was advised to seek help from primary care physician for the same.  She will return for follow-up in 3 months with my nurse practitioner Janett Billow or call earlier if necessary. Greater than 50% of time during this 30 minute visit was spent on counseling,explanation of diagnosis, planning of further management, discussion with patient and family and coordination of care Antony Contras, MD  [facility] [address] [place], [place] [address]  Phone [phone] Fax [phone] Note: This document was prepared with digital dictation and possible smart phrase technology. Any transcriptional errors that result from this process are unintentional

## 2019-03-13 NOTE — Telephone Encounter (Signed)
Spoke with the pt  She states that she is needing sleep study results  Please advise thanks

## 2019-03-14 NOTE — Telephone Encounter (Signed)
Notified pt result are not available yet. She is upset and wants results b/c she says red light came on. She says she assumed she would have results back within 1 week. Pt anxious to get results.

## 2019-03-17 NOTE — Telephone Encounter (Signed)
Will leave encounter open pending sleep study results.

## 2019-03-18 ENCOUNTER — Ambulatory Visit: Payer: PPO

## 2019-03-18 DIAGNOSIS — G4733 Obstructive sleep apnea (adult) (pediatric): Secondary | ICD-10-CM | POA: Diagnosis not present

## 2019-03-19 NOTE — Telephone Encounter (Signed)
I called and spoke with the patient in regards to her sleep study results. I made her aware that the test did show Severe OSA and recommendation is auto CPAP 5-18. I advised her that I will send over an order so that she can get her machine. She verbalized understanding and request to use [employer] as her husband does and hopefully the same machine. Nothing further is needed a this time.

## 2019-03-28 ENCOUNTER — Other Ambulatory Visit: Payer: Self-pay

## 2019-03-28 NOTE — Patient Outreach (Signed)
[facility]) Care Management Chronic Special Needs Program  03/28/2019  Name: Jakila Valenti-Cohen DOB: April 21, 1966  MRN: [id number]  Ms. Jourden Valenti-Cohen is enrolled in a chronic special needs plan for Diabetes. Reviewed and updated care plan.  Subjective: Client states she is doing better.  States that she saw Dr.Kerr a few weeks ago and her ordered a [employer] Rx.  States she has been scanning her blood sugars numerous times a day.  States she is having fewer low readings as she knows sooner when her sugars are dropping.  States her husband is doing her pump changes but other wise she looks after herself.  States he is doing better since he came home from the hospital last month.  States they are getting the delivered meals and states it helps them   Goals Addressed            This Visit's Progress    COMPLETED:  Acknowledge receipt of Advanced Directive package       Received Advanced Directives  packet     Advanced Care Planning complete by next 9 months   On track    Client understands the importance of follow-up with providers by attending scheduled visits   On track    COMPLETED: Client will have ADL/IADL needs addressed by next 3 months       Referral completed     Client will not report change from baseline and no repeated symptoms of stroke with in the next 3 months (continue 03/28/19)   On track    COMPLETED: Client will report abillity to obtain Medications within next 3 months       Pharmacy completed referral for assistance     Client will report no fall or injuries in the next 3 months.(continue 03/28/19)   On track    Client will report no worsening of symptoms of Atrial Fibrillation within the next 6 months(continue 03/28/19)   On track    Client will use Assistive Devices as needed and verbalize understanding of device use   On track    Client/Caregiver will verbalize understanding of instructions related to self-care and safety    On track    Decrease inpatient admissions/ readmissions with in the next year   On track    Decrease inpatient diabetes admissions/readmissions with in the year   On track    HEMOGLOBIN A1C < 7.0       Diabetes self management actions:  Glucose monitoring per provider recommendations  Eat Healthy  Check feet daily  Visit provider every 3-6 months as directed  Hbg A1C level every 3-6 months.  Eye Exam yearly     Maintain timely refills of diabetic medication as prescribed within the year .   On track    COMPLETED: Obtain annual  Lipid Profile, LDL-C       Completed 10/22/2018     COMPLETED: Obtain Annual Eye (retinal)  Exam        Completed 12/27/2018     COMPLETED: Obtain Annual Foot Exam       Completed 03/11/2019     COMPLETED: Obtain annual screen for micro albuminuria (urine) , nephropathy (kidney problems)       Completed 09/17/2018     COMPLETED: Obtain Hemoglobin A1C at least 2 times per year       Completed 10/22/2018, 02/06/2019     Visit Primary Care Provider or Endocrinologist at least 2 times per year    On track  Client is meeting diabetes self management goal of hemoglobin A1C of <7% with last reading of 6.4% Reviewed s/s of hypoglycemia and how to treat.  Instructed that her Elenor Legato monitor can help her identify when her blood sugars are trending down so she can treat before she becomes too low Reviewed number for 24-hour nurse Line Discussed COVID19 cause, symptoms, precautions (social distancing, stay at home order, hand washing), confirmed client knows how to contact provider.  Plan:  Send successful outreach letter with a copy of their individualized care plan and Send individual care plan to provider  Chronic care management coordinator will outreach in:  3 Months     [name] RN, [employer], [place] Management Coordinator [place] Management [phone]

## 2019-03-29 ENCOUNTER — Telehealth: Payer: Self-pay | Admitting: Pulmonary Disease

## 2019-03-29 NOTE — Telephone Encounter (Signed)
Can we look in to the process of her machine

## 2019-03-31 ENCOUNTER — Telehealth: Payer: Self-pay | Admitting: *Deleted

## 2019-03-31 NOTE — Telephone Encounter (Signed)
This was faxed to them on the 03/21/19 to fax number requested

## 2019-03-31 NOTE — Telephone Encounter (Signed)
-----   Message from [place], MD sent at 03/29/2019  6:13 PM EDT ----- Regarding: Sleep machine Patient called over the weekend regarding status of her CPAP machine. She has talked to someone, unsure if it was our staff or if she spoke to [place] (does not know which one) but would like to know how and when she will receive her device.   Could someone please look into the details of this and update the patient?  JE

## 2019-03-31 NOTE — Telephone Encounter (Signed)
I see where the order for cpap was placed on 03/19/2019, and [facility] requested the sleep study results on 03/21/2019, and there is no further documentation from that point.  PCCs please advise if you have faxed the sleep study results to Learta Codding at [phone] as requested.  Thanks!

## 2019-03-31 NOTE — Telephone Encounter (Signed)
Spoke with Huey Romans, states that they are still processing cpap through insurance.  They spoke with pt on 9/8 to make aware of the status.  Advised that once insurance approves the cpap they will be calling pt to set up delivery of cpap.    LMTCB for pt to relay this info. We can also give pt the number to [employer] [phone] so she can follow up directly with them if she has additional questions about the status of her cpap order.

## 2019-03-31 NOTE — Telephone Encounter (Signed)
Thank you Patrice! Nothing further needed at this time- will close encounter.

## 2019-03-31 NOTE — Telephone Encounter (Signed)
This is already being looked into through 03/29/2019 phone note started by Dr. Ander Slade.  Will close this duplicate encounter.

## 2019-04-02 DIAGNOSIS — H179 Unspecified corneal scar and opacity: Secondary | ICD-10-CM | POA: Diagnosis not present

## 2019-04-02 DIAGNOSIS — Z961 Presence of intraocular lens: Secondary | ICD-10-CM | POA: Diagnosis not present

## 2019-04-02 DIAGNOSIS — H18602 Keratoconus, unspecified, left eye: Secondary | ICD-10-CM | POA: Diagnosis not present

## 2019-04-02 DIAGNOSIS — H18452 Nodular corneal degeneration, left eye: Secondary | ICD-10-CM | POA: Diagnosis not present

## 2019-04-04 ENCOUNTER — Other Ambulatory Visit: Payer: Self-pay

## 2019-04-04 ENCOUNTER — Other Ambulatory Visit: Payer: Self-pay | Admitting: Pharmacy Technician

## 2019-04-04 NOTE — Telephone Encounter (Signed)
Pt calling to check status of cpap machine. Would like to know if machine is here. Please call back

## 2019-04-04 NOTE — Patient Outreach (Signed)
[facility]) Care Management  04/04/2019  Tina [name] 1966-05-02 [id number]   Successful outreach call placed to patient in regards to [employer] application for [place].  Spoke to patient, HIPAA identifiers verified.  Received the following inbasket message:  [name] I had a message from Maryjean Morn [name]'s husband that she had received a letter from [employer] that he does not understand. Please contact him to help answer his questions.  Thanks  [name] RN, [facility], [place] Management  [phone]    Patient informed that her husband [name] had called the concierge number and the issue was taken care of. She thanked me for returning [employer]. Nohealani Medinger, [facility] Management [phone]

## 2019-04-04 NOTE — Patient Outreach (Signed)
  [place] [facility]) Care Management Chronic Special Needs Program    04/04/2019  Name: Tina [name], DOB: December 26, 1965  MRN: [id number]   [name] is enrolled in a chronic special needs plan for Diabetes. Received phone message from clients husband Maryjean Morn Designated Party Release that she had received a letter from [employer] that he has questions about.  Attempted to return call and secure  message left.  Plan to send in basket message to [facility], CPhT to help answer his questions. Plan to follow up as per schedule.  [name] RN, [name], CDE Chronic Care Management Coordinator [place] [facility] Care Management [phone]

## 2019-04-04 NOTE — Telephone Encounter (Signed)
Called spoke with patient, she states her husband is on phone with [employer]. Nothing further needed.

## 2019-04-07 DIAGNOSIS — G4733 Obstructive sleep apnea (adult) (pediatric): Secondary | ICD-10-CM | POA: Diagnosis not present

## 2019-04-09 ENCOUNTER — Encounter: Payer: Self-pay | Admitting: Gynecology

## 2019-04-25 DIAGNOSIS — I480 Paroxysmal atrial fibrillation: Secondary | ICD-10-CM | POA: Diagnosis not present

## 2019-04-25 DIAGNOSIS — G4733 Obstructive sleep apnea (adult) (pediatric): Secondary | ICD-10-CM | POA: Diagnosis not present

## 2019-04-25 DIAGNOSIS — E785 Hyperlipidemia, unspecified: Secondary | ICD-10-CM | POA: Diagnosis not present

## 2019-04-25 DIAGNOSIS — E1142 Type 2 diabetes mellitus with diabetic polyneuropathy: Secondary | ICD-10-CM | POA: Diagnosis not present

## 2019-04-25 DIAGNOSIS — I10 Essential (primary) hypertension: Secondary | ICD-10-CM | POA: Diagnosis not present

## 2019-04-25 DIAGNOSIS — I6930 Unspecified sequelae of cerebral infarction: Secondary | ICD-10-CM | POA: Diagnosis not present

## 2019-04-25 DIAGNOSIS — N179 Acute kidney failure, unspecified: Secondary | ICD-10-CM | POA: Diagnosis not present

## 2019-05-07 DIAGNOSIS — G4733 Obstructive sleep apnea (adult) (pediatric): Secondary | ICD-10-CM | POA: Diagnosis not present

## 2019-05-15 DIAGNOSIS — E785 Hyperlipidemia, unspecified: Secondary | ICD-10-CM | POA: Diagnosis not present

## 2019-05-15 DIAGNOSIS — I10 Essential (primary) hypertension: Secondary | ICD-10-CM | POA: Diagnosis not present

## 2019-05-15 DIAGNOSIS — E1142 Type 2 diabetes mellitus with diabetic polyneuropathy: Secondary | ICD-10-CM | POA: Diagnosis not present

## 2019-05-15 DIAGNOSIS — I480 Paroxysmal atrial fibrillation: Secondary | ICD-10-CM | POA: Diagnosis not present

## 2019-05-15 DIAGNOSIS — N179 Acute kidney failure, unspecified: Secondary | ICD-10-CM | POA: Diagnosis not present

## 2019-05-15 DIAGNOSIS — E119 Type 2 diabetes mellitus without complications: Secondary | ICD-10-CM | POA: Diagnosis not present

## 2019-05-15 DIAGNOSIS — I619 Nontraumatic intracerebral hemorrhage, unspecified: Secondary | ICD-10-CM | POA: Diagnosis not present

## 2019-06-04 DIAGNOSIS — E103592 Type 1 diabetes mellitus with proliferative diabetic retinopathy without macular edema, left eye: Secondary | ICD-10-CM | POA: Diagnosis not present

## 2019-06-04 DIAGNOSIS — H43392 Other vitreous opacities, left eye: Secondary | ICD-10-CM | POA: Diagnosis not present

## 2019-06-04 DIAGNOSIS — H43812 Vitreous degeneration, left eye: Secondary | ICD-10-CM | POA: Diagnosis not present

## 2019-06-04 DIAGNOSIS — H35372 Puckering of macula, left eye: Secondary | ICD-10-CM | POA: Diagnosis not present

## 2019-06-07 DIAGNOSIS — G4733 Obstructive sleep apnea (adult) (pediatric): Secondary | ICD-10-CM | POA: Diagnosis not present

## 2019-06-17 ENCOUNTER — Other Ambulatory Visit: Payer: Self-pay | Admitting: Internal Medicine

## 2019-06-17 DIAGNOSIS — Z1231 Encounter for screening mammogram for malignant neoplasm of breast: Secondary | ICD-10-CM

## 2019-06-24 ENCOUNTER — Other Ambulatory Visit: Payer: Self-pay | Admitting: Internal Medicine

## 2019-06-24 DIAGNOSIS — R7989 Other specified abnormal findings of blood chemistry: Secondary | ICD-10-CM

## 2019-06-27 ENCOUNTER — Ambulatory Visit: Payer: Self-pay

## 2019-07-04 ENCOUNTER — Other Ambulatory Visit: Payer: HMO

## 2019-07-07 DIAGNOSIS — G4733 Obstructive sleep apnea (adult) (pediatric): Secondary | ICD-10-CM | POA: Diagnosis not present

## 2019-07-08 ENCOUNTER — Telehealth: Payer: Self-pay | Admitting: *Deleted

## 2019-07-08 ENCOUNTER — Other Ambulatory Visit: Payer: Self-pay

## 2019-07-08 NOTE — Telephone Encounter (Signed)
Patient contacted me that her husband is in the hospital and he typically changes out her insulin pump for her due to her impaired vision. I have contacted Pauline Good, RN, CDE with [employer] who suggested that perhaps a friend could help if they called the 24 hour Help Line or she would be available to help give directions to this friend over the phone. I have reached out to one friend who is available to go to her home tomorrow, Wednesday, to change out the pump and fill some extra reservoirs to help with the next change out. I have provided [place] Pepper phone number [phone]) to the friend and will send it to this patient as well.   Patient is aware I will be out of town for the next week, but now has options for assistance in changing out her pump every three days until her husband can return home.   A longer term solution can be explored after the holidays as needed.

## 2019-07-08 NOTE — Patient Outreach (Signed)
  [place] [facility]) Care Management Chronic Special Needs Program    07/08/2019  Name: Idalia Clasen, DOB: [birth year]  MRN: [id number]   Ms. Seaira Valenti-Cohen is enrolled in a chronic special needs plan for Diabetes. Called client to update Interdisciplinary care plan  HIPAA verified States that her husband is in the hospital and she is there with him.  States she does not have time to concentrate on talking about her diabetes. She is concerned about changing her insulin pump as her husband is not able to help her. States that Bev Derinda Sis has been helping her but she will be going on vacation. Spoke with Harvie Bridge RD, CDCES who states she has also reached out to the [employer] representative to see what resources are available to her.  Also some of her pump support group may be able to assist her or if she has a friend that can help her.  States she is going to see her on 07/31/19 to discuss her options other than using the pump Left message with at Dr. Cindra Eves office of clients situation.  The message with Dr.Kerr's assistance states that Dr. Buddy Duty is on vacation this week. Called Dr. Jacelyn Grip and message left about clients issue and possible need for home health order. Plan to follow up when call from Dr. Jacelyn Grip returned     [name] RN, [facility], [place] Management Coordinator [place] Management [phone]

## 2019-07-10 ENCOUNTER — Other Ambulatory Visit: Payer: Self-pay

## 2019-07-10 NOTE — Patient Outreach (Signed)
  [place] [facility]) Care Management Chronic Special Needs Program    07/10/2019  Name: Tina [name], DOB: 12/04/1965  MRN: [id number]   Tina [name] is enrolled in a chronic special needs plan for Diabetes. See telephone note from [facility], CDE concerning clients insulin pump changes as friend has been arranged to help client with telephone backup of [employer] representative.  Spoke with Jonni Sanger CMA at Dr. Cindra Eves office of the current plan and that client will need to develop a long term plan in the future. Plan to follow up next week with client    [name] RN, [facility], [place] Management Coordinator [place] Management [phone]

## 2019-07-15 ENCOUNTER — Other Ambulatory Visit: Payer: Self-pay

## 2019-07-15 NOTE — Patient Outreach (Signed)
[place] [facility]) Care Management Chronic Special Needs Program  07/15/2019  Name: Tina [name] DOB: Jun 30, 1966  MRN: [id number]  [name] is enrolled in a chronic special needs plan for Diabetes. Reviewed and updated care plan.  Subjective: Client states that she has a friend that is helping her with her insulin pump changes and she has it handled currently.  States she and her husband are still getting the [employer] but she is concerned about the amount of sodium that is in them for her husband.   Goals Addressed            This Visit's Progress   . COMPLETED: Advanced Care Planning complete by next 9 months   Not on track    Not completed    . COMPLETED: Client understands the importance of follow-up with providers by attending scheduled visits   On track    Keeps scheduled appointments     . COMPLETED: Client will not report change from baseline and no repeated symptoms of stroke with in the next 3 months (continue 03/28/19)   On track    No new stroke symptoms reported    . COMPLETED: Client will report no fall or injuries in the next 3 months.(continue 03/28/19)   On track    No recent falls    . COMPLETED: Client will report no worsening of symptoms of Atrial Fibrillation within the next 6 months(continue 03/28/19)   On track    No worsening of Atrial fibrillation     . Client will use Assistive Devices as needed and verbalize understanding of device use   On track    Needs assistance with insulin pump    . COMPLETED: Client/Caregiver will verbalize understanding of instructions related to self-care and safety   On track    Received safety information    . COMPLETED: Decrease inpatient admissions/ readmissions with in the next year   On track    No further admissions this year    . COMPLETED: Decrease inpatient diabetes admissions/readmissions with in the year   On track    No diabetes admissions    . HEMOGLOBIN A1C < 7.0      Diabetes self management actions:  Glucose monitoring per provider recommendations  Eat Healthy  Check feet daily  Visit provider every 3-6 months as directed  Hbg A1C level every 3-6 months.  Eye Exam yearly    . COMPLETED: Maintain timely refills of diabetic medication as prescribed within the year .   On track    Maintaining refills    . COMPLETED: Visit Primary Care Provider or Endocrinologist at least 2 times per year        Endocrinologist Dr.Kerr 12/05/18, 02/06/19,03/11/19     Client is  meeting diabetes self management goal of hemoglobin A1C of <7% with last reading of 6.4% Advised client that [facility] will contact Social Work about her meal delivery concerns to see if menu can be adjusted Client cut off conversation as she states she has a lot going on with her husband in the hospital and she will call RNCM if needed  Plan:  Send successful outreach letter with a copy of their individualized care plan and Send individual care plan to provider  Chronic care management coordinator will outreach in:  3 Months per tier level or sooner if needed  Will refer to:  Social Work-Messaged Geographical information systems officer, BSW about [employer]   Ruslan Mccabe RN, [place], CDE Chronic Care Management Coordinator [facility] Care Management [phone]

## 2019-07-17 DIAGNOSIS — E1142 Type 2 diabetes mellitus with diabetic polyneuropathy: Secondary | ICD-10-CM | POA: Diagnosis not present

## 2019-07-17 DIAGNOSIS — N179 Acute kidney failure, unspecified: Secondary | ICD-10-CM | POA: Diagnosis not present

## 2019-07-17 DIAGNOSIS — E119 Type 2 diabetes mellitus without complications: Secondary | ICD-10-CM | POA: Diagnosis not present

## 2019-07-17 DIAGNOSIS — I480 Paroxysmal atrial fibrillation: Secondary | ICD-10-CM | POA: Diagnosis not present

## 2019-07-17 DIAGNOSIS — I10 Essential (primary) hypertension: Secondary | ICD-10-CM | POA: Diagnosis not present

## 2019-07-17 DIAGNOSIS — I619 Nontraumatic intracerebral hemorrhage, unspecified: Secondary | ICD-10-CM | POA: Diagnosis not present

## 2019-07-17 DIAGNOSIS — E785 Hyperlipidemia, unspecified: Secondary | ICD-10-CM | POA: Diagnosis not present

## 2019-07-18 DIAGNOSIS — G4733 Obstructive sleep apnea (adult) (pediatric): Secondary | ICD-10-CM | POA: Diagnosis not present

## 2019-07-27 DIAGNOSIS — E1065 Type 1 diabetes mellitus with hyperglycemia: Secondary | ICD-10-CM | POA: Diagnosis not present

## 2019-07-30 ENCOUNTER — Other Ambulatory Visit: Payer: Self-pay

## 2019-07-30 NOTE — Patient Outreach (Signed)
  [place] [facility]) Care Management Chronic Special Needs Program    07/30/2019  Name: Tina [name], DOB: 07-Aug-1965  MRN: [id number]   [name] is enrolled in a chronic special needs plan for Diabetes. RNCM received message from [place], CDE that client's husband expired and client is moving to [place] to live with her sisters who will assist her with her insulin pump changes. Plan to contact client later this week to verify that she has moved and to assist as needed  [place], [name], [place] Management [phone]

## 2019-07-30 NOTE — Patient Outreach (Signed)
  [place] [facility]) Care Management Chronic Special Needs Program   07/30/2019  Name: Tina [name], DOB: October 06, 1965  MRN: [id number]  The client was discussed in today's interdisciplinary care team meeting.  The following issues were discussed:  Client's needs, Key risk triggers/risk stratification, Care Plan, Coordination of care and Issues/barriers to care  Participants present:   Thea Silversmith, MSN, RN, CCM   Domanique Luckett RN,BSN,CCM, CDE  Quinn Plowman RN, BSN, CCM Kelli Churn, RN, CCM, CDE  Maryella Shivers, MD  Bary Castilla, RN, BSN, MS, CCM Coralie Carpen, MD [place] [facility] CBCS/CMAA  Recommendations:  Client is moving to [place] with sisters   Follow-up:  RNCM plans to call client to verify she has moved.  [name] RN, [name], CDE Chronic Care Management Coordinator [place] [facility] Care Management [phone]

## 2019-07-31 ENCOUNTER — Ambulatory Visit: Payer: HMO | Admitting: *Deleted

## 2019-08-07 DIAGNOSIS — G4733 Obstructive sleep apnea (adult) (pediatric): Secondary | ICD-10-CM | POA: Diagnosis not present

## 2019-08-08 DIAGNOSIS — I619 Nontraumatic intracerebral hemorrhage, unspecified: Secondary | ICD-10-CM | POA: Diagnosis not present

## 2019-08-08 DIAGNOSIS — E1142 Type 2 diabetes mellitus with diabetic polyneuropathy: Secondary | ICD-10-CM | POA: Diagnosis not present

## 2019-08-08 DIAGNOSIS — N179 Acute kidney failure, unspecified: Secondary | ICD-10-CM | POA: Diagnosis not present

## 2019-08-08 DIAGNOSIS — E119 Type 2 diabetes mellitus without complications: Secondary | ICD-10-CM | POA: Diagnosis not present

## 2019-08-08 DIAGNOSIS — I10 Essential (primary) hypertension: Secondary | ICD-10-CM | POA: Diagnosis not present

## 2019-08-08 DIAGNOSIS — I480 Paroxysmal atrial fibrillation: Secondary | ICD-10-CM | POA: Diagnosis not present

## 2019-08-08 DIAGNOSIS — E785 Hyperlipidemia, unspecified: Secondary | ICD-10-CM | POA: Diagnosis not present

## 2019-08-13 ENCOUNTER — Other Ambulatory Visit: Payer: Self-pay

## 2019-08-13 NOTE — Patient Outreach (Signed)
  [place] [facility]) Care Management Chronic Special Needs Program    08/13/2019  Name: Pami Favret, DOB: [birth year]  MRN: [id number]   [name] is enrolled in a chronic special needs plan for Diabetes. Called client to verify that she has moved to [place].  HIPAA verified States she is with her sister now in [place] and she is in the process of getting new insurance. States she is trying to deal with her husbands death.  Client states she does not want to talk any further Instructed client that [facility] will close her case when she is dis-enrolled from the Chronic Special Needs Program Plan to close case next month [name] RN, [name], [place] Management [phone]

## 2019-08-29 DIAGNOSIS — F329 Major depressive disorder, single episode, unspecified: Secondary | ICD-10-CM | POA: Diagnosis not present

## 2019-08-29 DIAGNOSIS — Z9114 Patient's other noncompliance with medication regimen: Secondary | ICD-10-CM | POA: Diagnosis not present

## 2019-08-29 DIAGNOSIS — I161 Hypertensive emergency: Secondary | ICD-10-CM | POA: Diagnosis not present

## 2019-08-29 DIAGNOSIS — R410 Disorientation, unspecified: Secondary | ICD-10-CM | POA: Diagnosis not present

## 2019-08-29 DIAGNOSIS — Z741 Need for assistance with personal care: Secondary | ICD-10-CM | POA: Diagnosis not present

## 2019-08-29 DIAGNOSIS — I610 Nontraumatic intracerebral hemorrhage in hemisphere, subcortical: Secondary | ICD-10-CM | POA: Diagnosis not present

## 2019-08-29 DIAGNOSIS — F0151 Vascular dementia with behavioral disturbance: Secondary | ICD-10-CM | POA: Diagnosis not present

## 2019-08-29 DIAGNOSIS — E1169 Type 2 diabetes mellitus with other specified complication: Secondary | ICD-10-CM | POA: Diagnosis not present

## 2019-08-29 DIAGNOSIS — H541152 Blindness right eye category 5, low vision left eye category 2: Secondary | ICD-10-CM | POA: Diagnosis not present

## 2019-08-29 DIAGNOSIS — I618 Other nontraumatic intracerebral hemorrhage: Secondary | ICD-10-CM | POA: Diagnosis not present

## 2019-08-29 DIAGNOSIS — R2681 Unsteadiness on feet: Secondary | ICD-10-CM | POA: Diagnosis not present

## 2019-08-29 DIAGNOSIS — I639 Cerebral infarction, unspecified: Secondary | ICD-10-CM | POA: Diagnosis not present

## 2019-08-29 DIAGNOSIS — Z970 Presence of artificial eye: Secondary | ICD-10-CM | POA: Diagnosis not present

## 2019-08-29 DIAGNOSIS — R262 Difficulty in walking, not elsewhere classified: Secondary | ICD-10-CM | POA: Diagnosis not present

## 2019-08-29 DIAGNOSIS — F4321 Adjustment disorder with depressed mood: Secondary | ICD-10-CM | POA: Diagnosis not present

## 2019-08-29 DIAGNOSIS — R4182 Altered mental status, unspecified: Secondary | ICD-10-CM | POA: Diagnosis not present

## 2019-08-29 DIAGNOSIS — N179 Acute kidney failure, unspecified: Secondary | ICD-10-CM | POA: Diagnosis not present

## 2019-08-29 DIAGNOSIS — I629 Nontraumatic intracranial hemorrhage, unspecified: Secondary | ICD-10-CM | POA: Diagnosis not present

## 2019-08-29 DIAGNOSIS — E785 Hyperlipidemia, unspecified: Secondary | ICD-10-CM | POA: Diagnosis not present

## 2019-08-29 DIAGNOSIS — I5022 Chronic systolic (congestive) heart failure: Secondary | ICD-10-CM | POA: Diagnosis not present

## 2019-08-29 DIAGNOSIS — G3184 Mild cognitive impairment, so stated: Secondary | ICD-10-CM | POA: Diagnosis not present

## 2019-08-29 DIAGNOSIS — R29702 NIHSS score 2: Secondary | ICD-10-CM | POA: Diagnosis not present

## 2019-08-29 DIAGNOSIS — E11649 Type 2 diabetes mellitus with hypoglycemia without coma: Secondary | ICD-10-CM | POA: Diagnosis not present

## 2019-08-29 DIAGNOSIS — I10 Essential (primary) hypertension: Secondary | ICD-10-CM | POA: Diagnosis not present

## 2019-08-29 DIAGNOSIS — E1165 Type 2 diabetes mellitus with hyperglycemia: Secondary | ICD-10-CM | POA: Diagnosis not present

## 2019-08-29 DIAGNOSIS — M6281 Muscle weakness (generalized): Secondary | ICD-10-CM | POA: Diagnosis not present

## 2019-08-29 DIAGNOSIS — I160 Hypertensive urgency: Secondary | ICD-10-CM | POA: Diagnosis not present

## 2019-08-29 DIAGNOSIS — R419 Unspecified symptoms and signs involving cognitive functions and awareness: Secondary | ICD-10-CM | POA: Diagnosis not present

## 2019-08-29 DIAGNOSIS — I509 Heart failure, unspecified: Secondary | ICD-10-CM | POA: Diagnosis not present

## 2019-08-29 DIAGNOSIS — R4701 Aphasia: Secondary | ICD-10-CM | POA: Diagnosis not present

## 2019-08-29 DIAGNOSIS — Z751 Person awaiting admission to adequate facility elsewhere: Secondary | ICD-10-CM | POA: Diagnosis not present

## 2019-08-29 DIAGNOSIS — I69320 Aphasia following cerebral infarction: Secondary | ICD-10-CM | POA: Diagnosis not present

## 2019-08-29 DIAGNOSIS — Z20822 Contact with and (suspected) exposure to covid-19: Secondary | ICD-10-CM | POA: Diagnosis not present

## 2019-08-29 DIAGNOSIS — F05 Delirium due to known physiological condition: Secondary | ICD-10-CM | POA: Diagnosis not present

## 2019-08-29 DIAGNOSIS — I110 Hypertensive heart disease with heart failure: Secondary | ICD-10-CM | POA: Diagnosis not present

## 2019-08-29 DIAGNOSIS — N289 Disorder of kidney and ureter, unspecified: Secondary | ICD-10-CM | POA: Diagnosis not present

## 2019-08-29 DIAGNOSIS — Z0389 Encounter for observation for other suspected diseases and conditions ruled out: Secondary | ICD-10-CM | POA: Diagnosis not present

## 2019-08-29 DIAGNOSIS — E119 Type 2 diabetes mellitus without complications: Secondary | ICD-10-CM | POA: Diagnosis not present

## 2019-08-29 DIAGNOSIS — G4733 Obstructive sleep apnea (adult) (pediatric): Secondary | ICD-10-CM | POA: Diagnosis not present

## 2019-08-29 DIAGNOSIS — I69319 Unspecified symptoms and signs involving cognitive functions following cerebral infarction: Secondary | ICD-10-CM | POA: Diagnosis not present

## 2019-09-03 ENCOUNTER — Other Ambulatory Visit: Payer: Self-pay

## 2019-09-03 NOTE — Patient Outreach (Signed)
  [place] [facility]) Care Management Chronic Special Needs Program    09/03/2019  Name: Tina [name], DOB: [birth year]  MRN: [id number]   [name] is enrolled in a chronic special needs plan for Diabetes. Client admitted to [facility] on 08/29/19 with altered mental status, acute intracranial bleed RNCM notified of location of admission on 09/03/19 Individualized care plan sent to [facility] fax [phone] for hospital admission  RNCM will continue to follow through [place] Utilization management   [name] RN, [facility], [place] Management [phone]

## 2019-09-12 DIAGNOSIS — R4182 Altered mental status, unspecified: Secondary | ICD-10-CM | POA: Diagnosis not present

## 2019-09-12 DIAGNOSIS — I509 Heart failure, unspecified: Secondary | ICD-10-CM | POA: Diagnosis not present

## 2019-09-12 DIAGNOSIS — R262 Difficulty in walking, not elsewhere classified: Secondary | ICD-10-CM | POA: Diagnosis not present

## 2019-09-12 DIAGNOSIS — M6281 Muscle weakness (generalized): Secondary | ICD-10-CM | POA: Diagnosis not present

## 2019-09-12 DIAGNOSIS — I10 Essential (primary) hypertension: Secondary | ICD-10-CM | POA: Diagnosis not present

## 2019-09-12 DIAGNOSIS — I629 Nontraumatic intracranial hemorrhage, unspecified: Secondary | ICD-10-CM | POA: Diagnosis not present

## 2019-09-12 DIAGNOSIS — R4701 Aphasia: Secondary | ICD-10-CM | POA: Diagnosis not present

## 2019-09-12 DIAGNOSIS — G3184 Mild cognitive impairment, so stated: Secondary | ICD-10-CM | POA: Diagnosis not present

## 2019-09-12 DIAGNOSIS — Z970 Presence of artificial eye: Secondary | ICD-10-CM | POA: Diagnosis not present

## 2019-09-12 DIAGNOSIS — E1169 Type 2 diabetes mellitus with other specified complication: Secondary | ICD-10-CM | POA: Diagnosis not present

## 2019-09-12 DIAGNOSIS — N179 Acute kidney failure, unspecified: Secondary | ICD-10-CM | POA: Diagnosis not present

## 2019-09-12 DIAGNOSIS — E785 Hyperlipidemia, unspecified: Secondary | ICD-10-CM | POA: Diagnosis not present

## 2019-09-12 DIAGNOSIS — Z741 Need for assistance with personal care: Secondary | ICD-10-CM | POA: Diagnosis not present

## 2019-09-16 ENCOUNTER — Other Ambulatory Visit: Payer: Self-pay

## 2019-09-16 NOTE — Patient Outreach (Signed)
  [place] [facility]) Care Management Chronic Special Needs Program    09/16/2019  Name: Tina [name], DOB: 1966-07-12  MRN: [id number]   [name] is enrolled in a chronic special needs plan for Diabetes. Notified 09/16/19 of client's transfer to [facility], [place], [place] after admission on 08/29/19 to [facility] in [place] for acute intracranial bleed. RNCM will continue to follow though [facility] psychologist for client's post discharge needs. [name] RN, [name], CDE Chronic Care Management Coordinator [place] [facility] Care Management [phone]

## 2019-09-26 ENCOUNTER — Other Ambulatory Visit: Payer: Self-pay

## 2019-09-26 NOTE — Patient Outreach (Signed)
  [place] [facility]) Care Management Chronic Special Needs Program    09/26/2019  Name: Tina [name], DOB: 1966/03/28  MRN: [id number]   Ms. Nailea Valenti-Cohen was enrolled in a chronic special needs plan for Diabetes. Case closed as client has disenrolled from [place] on 09/14/19 Plan to send case closure letter to [place] ([facility]) to notify client of dis-enrollment from [place], [facility], [place] Management Coordinator [place] Management [phone]

## 2021-02-13 IMAGING — CT CT ANGIOGRAPHY HEAD
4 of 15 series · 12 of 47 positions shown · IV contrast (APPLIED)
Comparison: Head CT from yesterday

CLINICAL DATA: Altered mental status and hemorrhage

EXAM:
CT ANGIOGRAPHY HEAD
TECHNIQUE: Multidetector CT imaging of the head was performed using the
standard protocol during bolus administration of intravenous
contrast. Multiplanar CT image reconstructions and MIPs were
obtained to evaluate the vascular anatomy.
CONTRAST:  50mL OMNIPAQUE IOHEXOL 350 MG/ML SOLN

[Series 7: head bone · axial · 0.40mm/px · z∈[-26,+54]mm · 3 of 81 slices shown]
[im 21/81  bone]
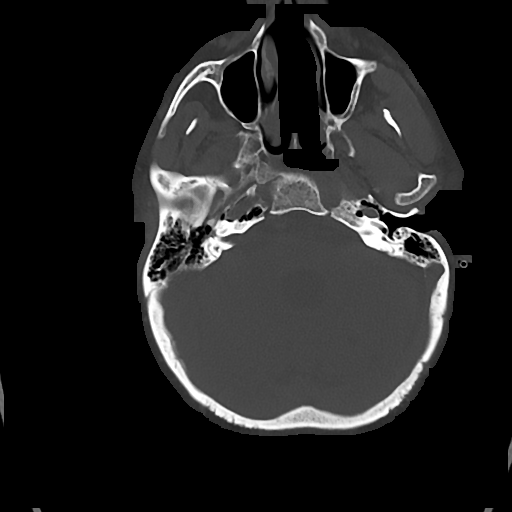
[im 41/81  bone]
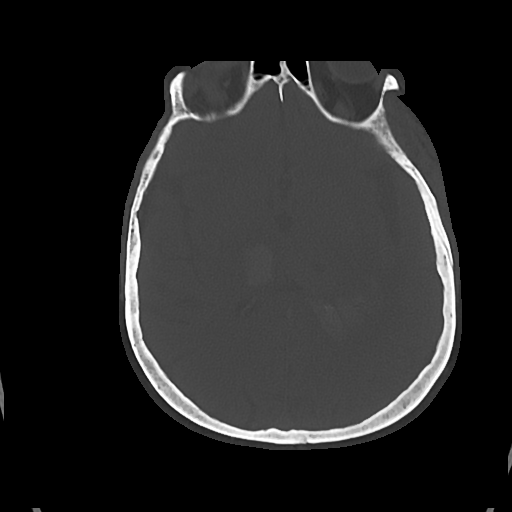
[im 61/81  bone]
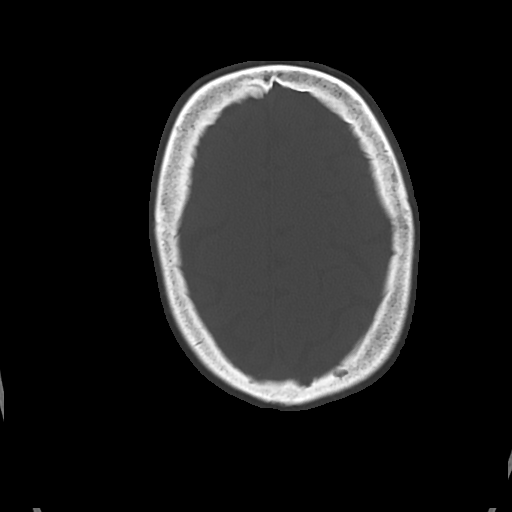

[Series 10: headangio 2.0 hr36 3 · axial · 0.40mm/px · z∈[-33,+43]mm · 3 of 78 slices shown]
[im 20/78  brain]
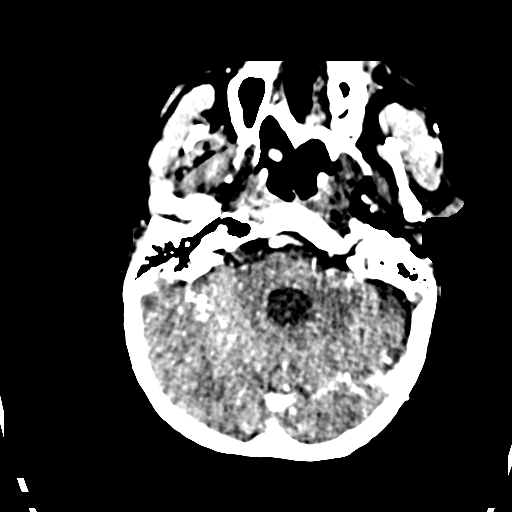
[im 39/78  bone]
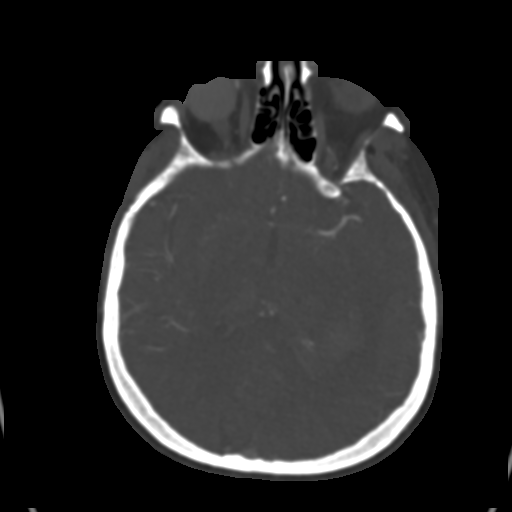
[im 58/78  brain]
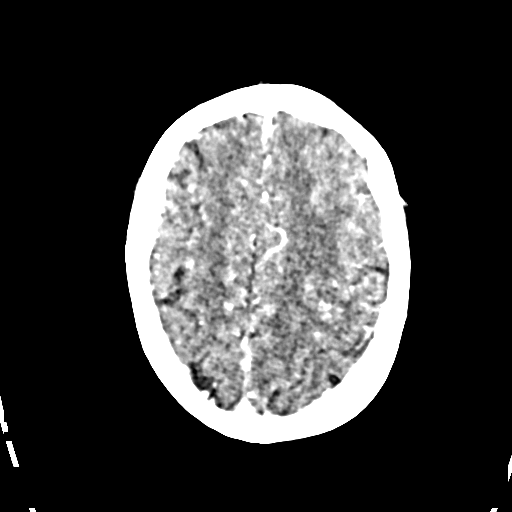

[Series 12: headangio 1.0 mpr cor · coronal · 0.34mm/px · 3 of 181 slices shown]
[im 37/181  brain]
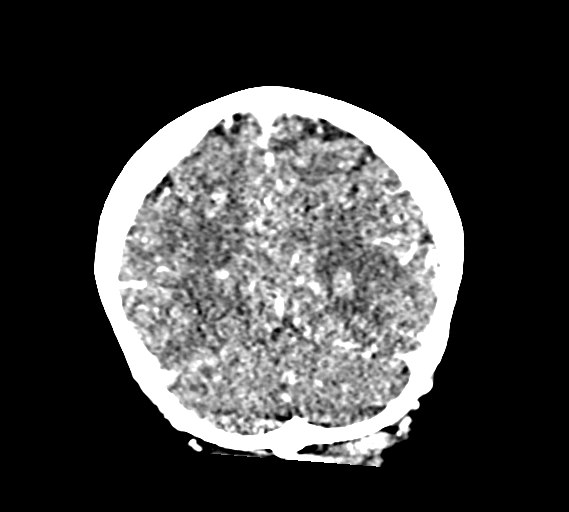
[im 73/181  brain]
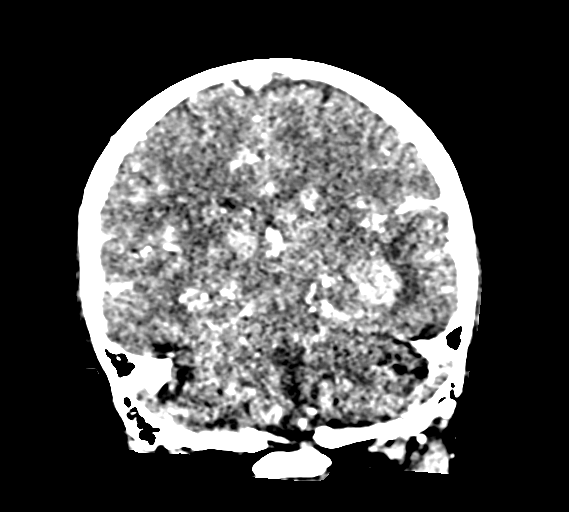
[im 109/181  brain]
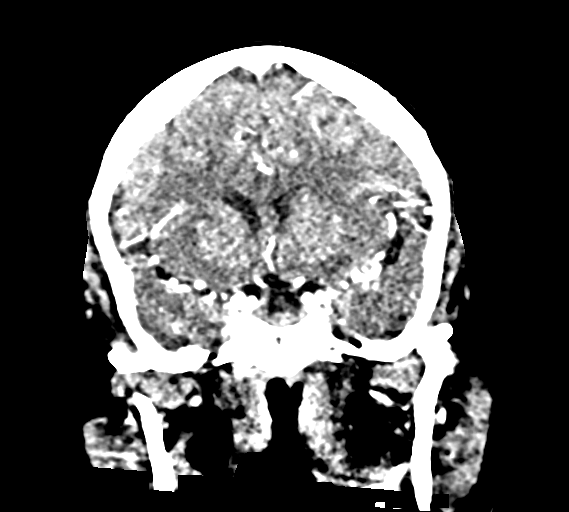

[Series 13: headangio 1.0 mpr sag · sagittal · 0.35mm/px · 3 of 146 slices shown]
[im 37/146  brain]
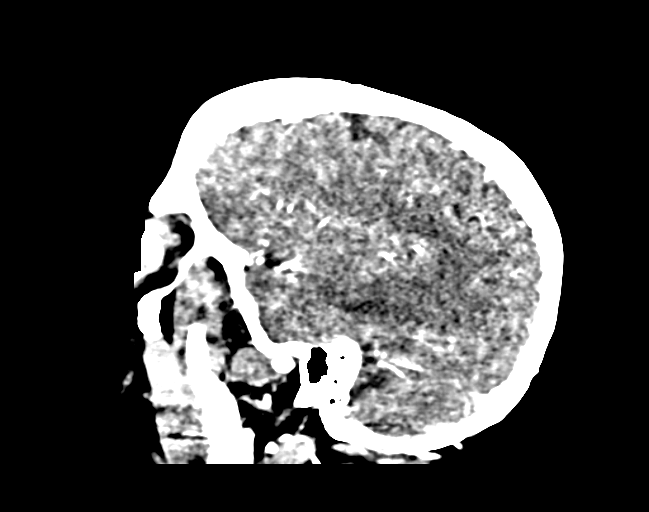
[im 73/146  brain]
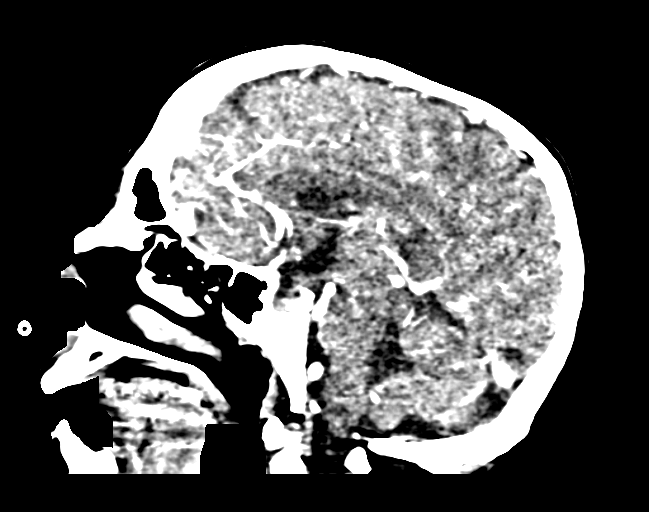
[im 109/146  brain]
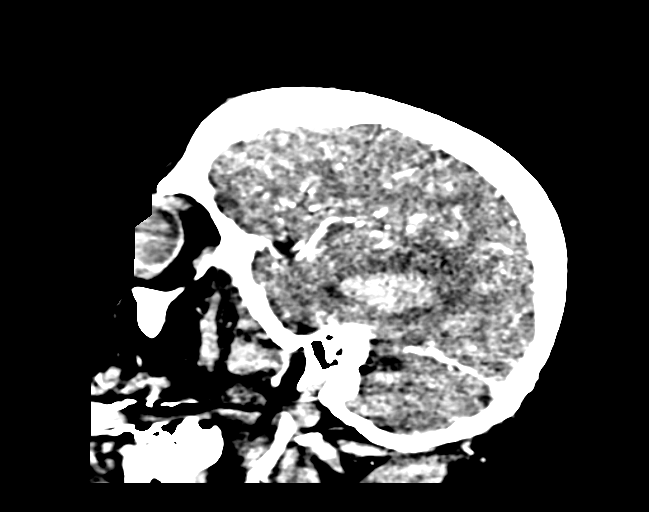

[12 of 47 positions shown; findings below may reference images not displayed]

FINDINGS: CT HEAD

Brain: 17-18 mm acute hematoma in the right thalamus, unchanged when
measured in a similar fashion. There is intraventricular clot in the
left more than right lateral ventricles with left temporal horn
dilatation. Small remote left cerebellar infarct superiorly on the
left. No acute infarct by preceding MRI.

Vascular: See below

Skull: Negative

Sinuses: Negative

Orbits: Bilateral cataract resection with oil tamponade and scleral
band on the right where there is a prosthesis.

CTA HEAD

Anterior circulation: No branch occlusion or flow limiting stenosis.
Hypoplastic right A1 segment. Calcified plaque on the right carotid
siphon and mild left M1 stenosis. Negative for aneurysm

Posterior circulation: The vertebral and basilar arteries are smooth
and widely patent. No branch occlusion, flow limiting stenosis, or
significant stenosis. There is a tiny high-density focus anteriorly
at the right thalamic hematoma without adjacent vascular
malformation.

Venous sinuses: Patent

Anatomic variants: As above

Delayed phase: No abnormal intracranial enhancement. Remote lacunar
infarct in the left thalamus.
IMPRESSION: 1. Size stable right thalamic and intraventricular clot with
dilatation of the left temporal horn.
2. Tiny spot sign associated with the right thalamic hematoma, of
questionable significance given 24 hour stability of intracranial
hemorrhage. No underlying vascular malformation or aneurysm.
3. Small remote left thalamic and left cerebellar infarcts.

## 2021-02-16 IMAGING — CT CT HEAD WITHOUT CONTRAST
4 series · 16 of 47 positions shown, 18 images · non-contrast
Comparison: Two days ago

CLINICAL DATA: Follow-up intracranial hemorrhage

EXAM:
CT HEAD WITHOUT CONTRAST
TECHNIQUE: Contiguous axial images were obtained from the base of the skull
through the vertex without intravenous contrast.

[Series 3: head wo · axial · 0.39mm/px · z∈[-85,+30]mm · 7 of 31 slices shown, 9 images]
[im 4/31  brain]
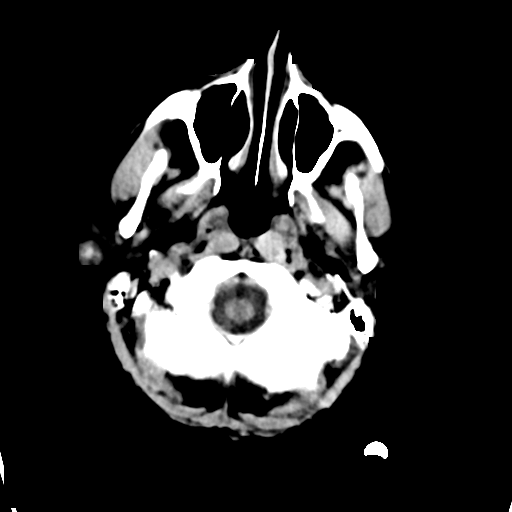
[im 4/31  bone]
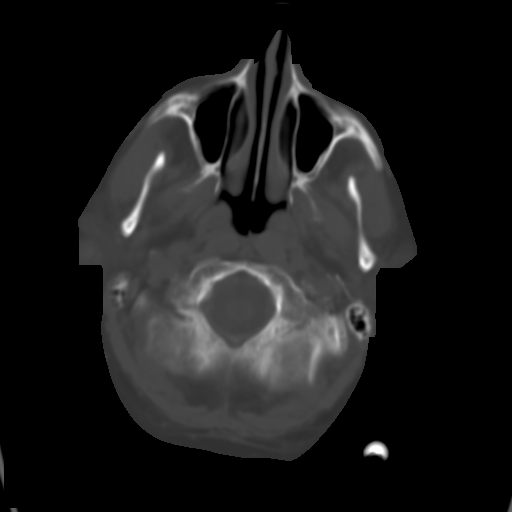
[im 8/31  brain]
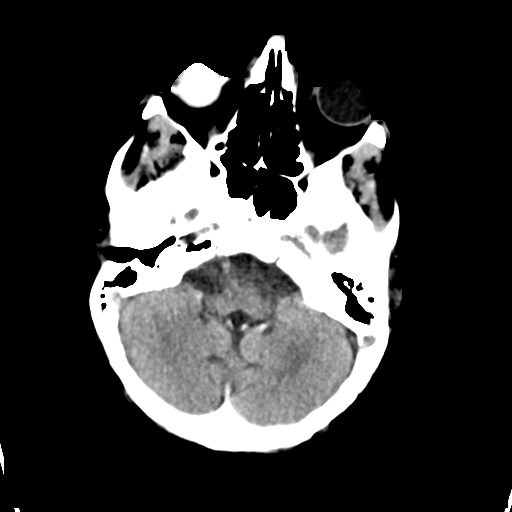
[im 12/31  brain]
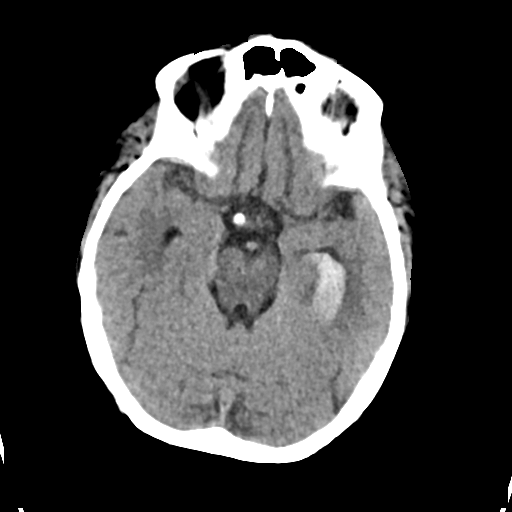
[im 16/31  brain]
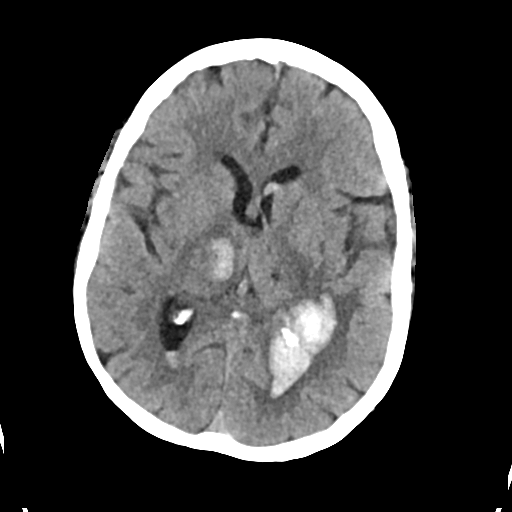
[im 19/31  brain]
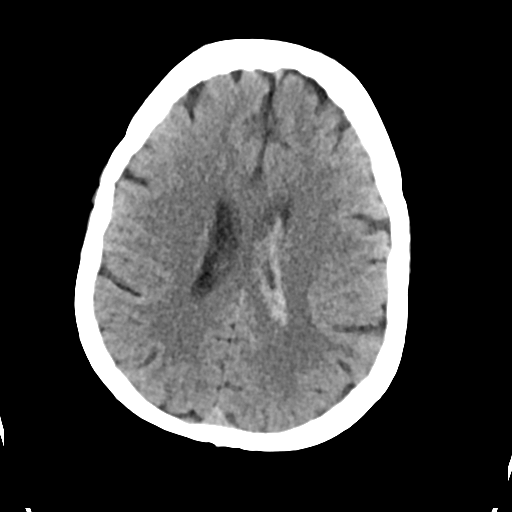
[im 19/31  bone]
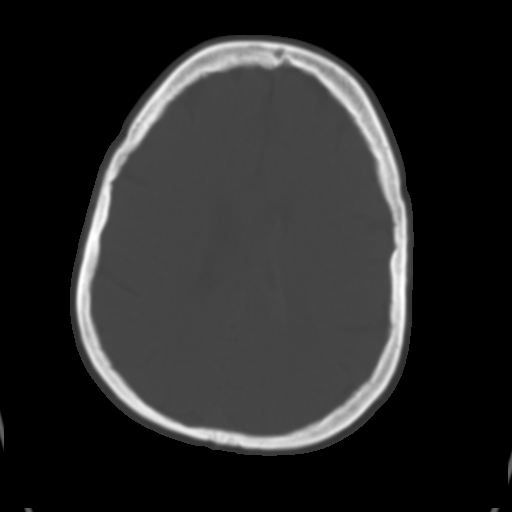
[im 23/31  brain]
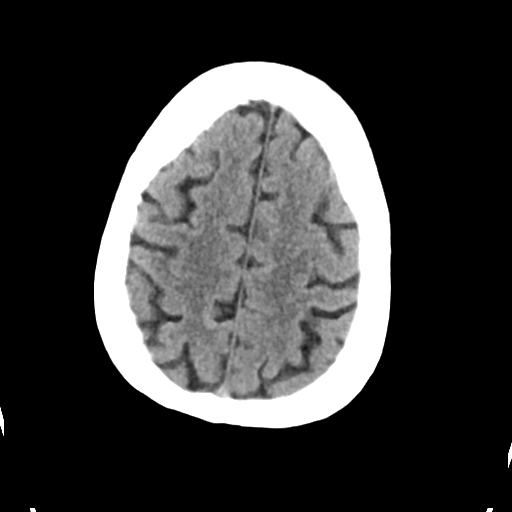
[im 27/31  brain]
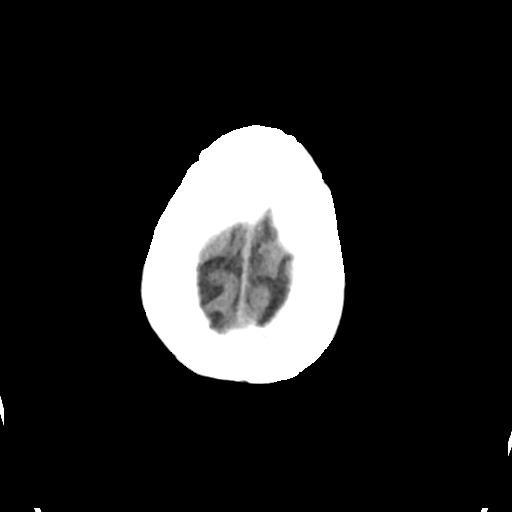

[Series 4: head bone · axial · 0.39mm/px · z∈[-86,-56]mm · 3 of 77 slices shown]
[im 8/77  bone]
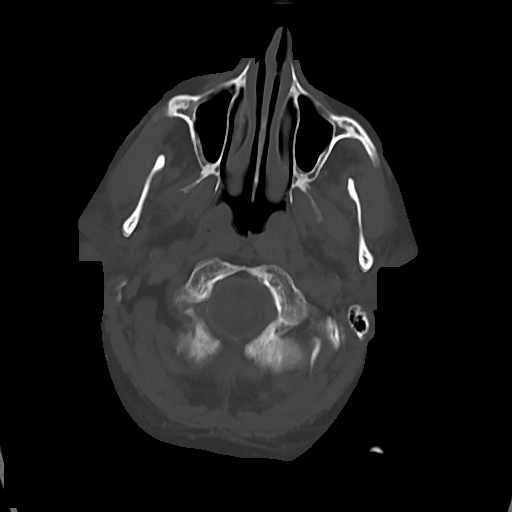
[im 16/77  bone]
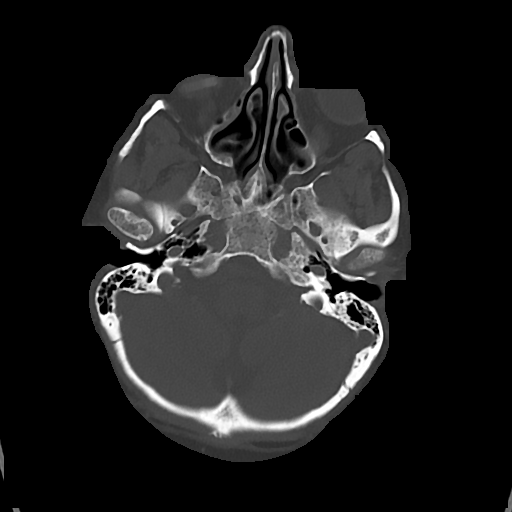
[im 23/77  bone]
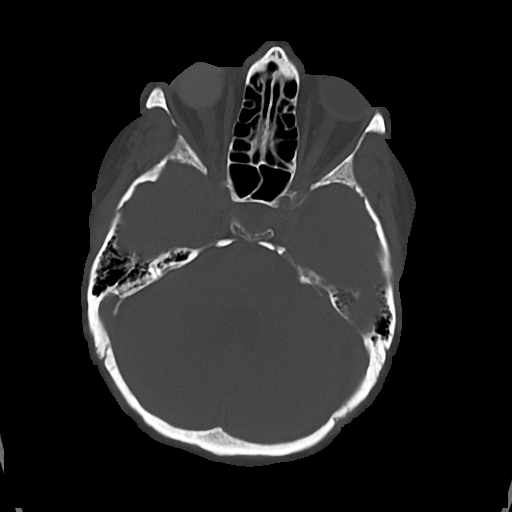

[Series 5: cor soft · coronal · 0.30mm/px · 3 of 67 slices shown]
[im 23/67  brain]
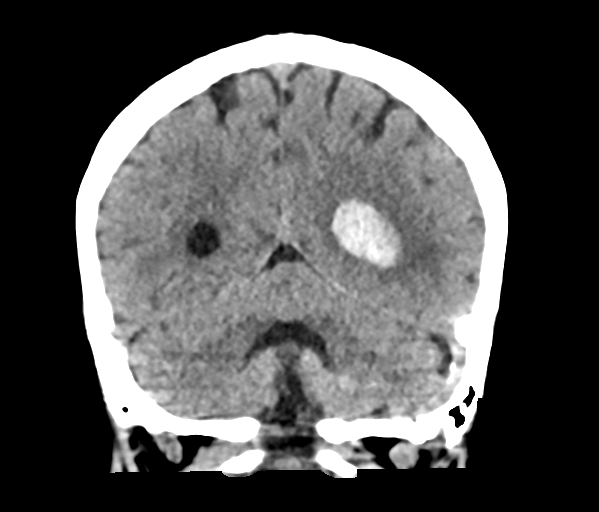
[im 30/67  brain]
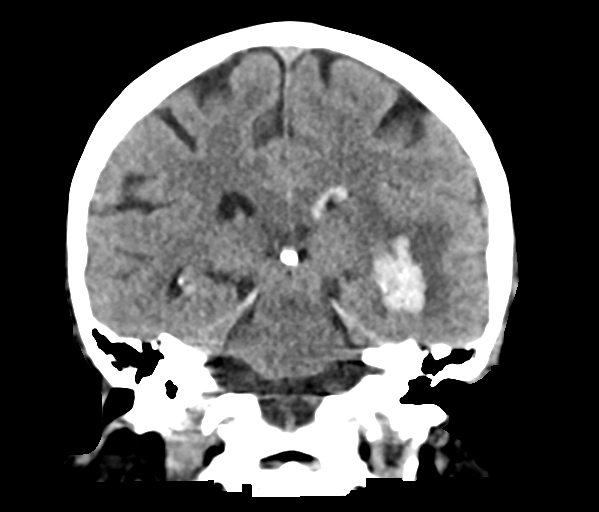
[im 37/67  brain]
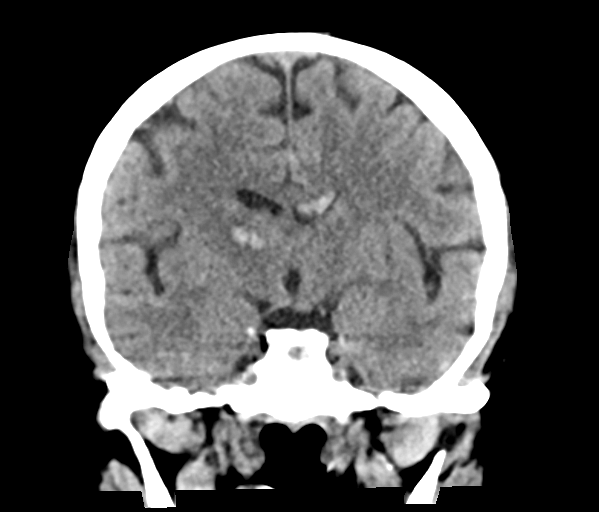

[Series 6: sag soft · sagittal · 0.30mm/px · 3 of 53 slices shown]
[im 18/53  brain]
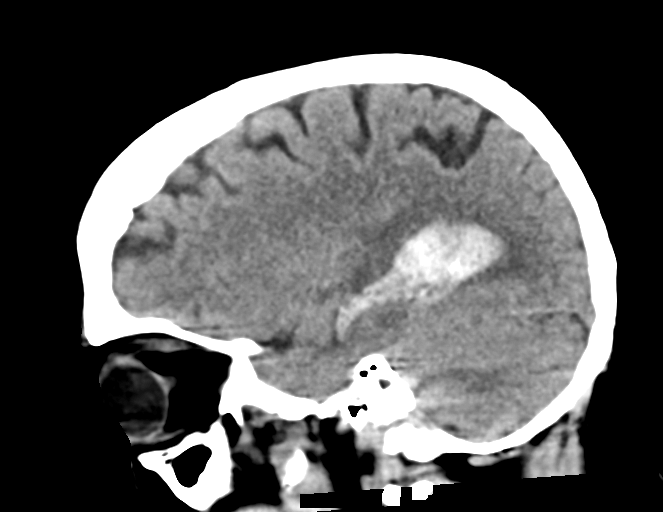
[im 27/53  brain]
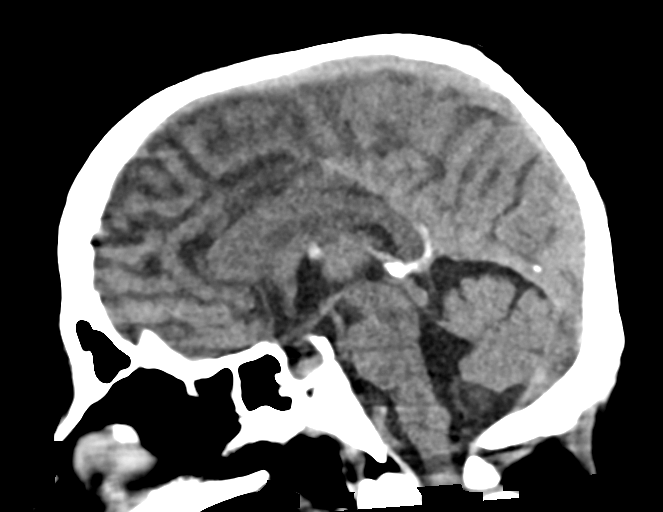
[im 35/53  brain]
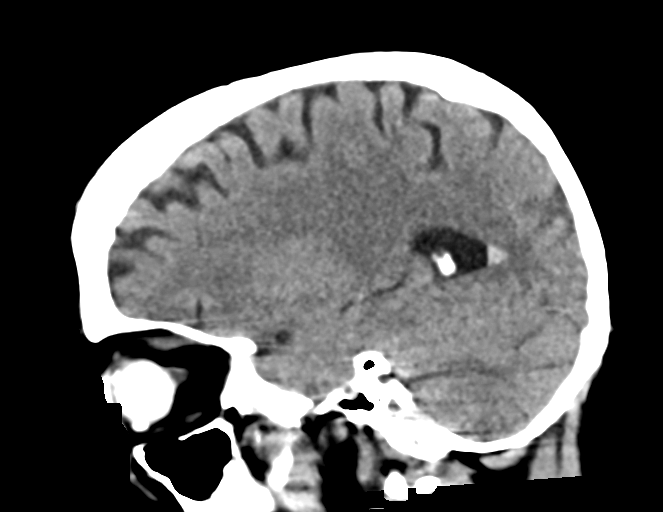

[16 of 47 positions shown; findings below may reference images not displayed]

FINDINGS: Brain: Unchanged hematoma in the right thalamus measuring up to 19
mm anterior-posterior. Intraventricular hemorrhage in the left more
than right lateral ventricles with temporal horn dilatation and mild
periventricular low-density on the left. Remote small vessel infarct
in the left cerebellum and left thalamus. No evident infarct or
shift.

Vascular: No hyperdense vessel or unexpected calcification.

Skull: No acute or aggressive finding

Sinuses/Orbits: Left cataract resection and right ocular prosthesis.
IMPRESSION: Unchanged right thalamic and intraventricular clot which dilates the
temporal horn of the left lateral ventricle.

## 2021-02-17 IMAGING — DX PORTABLE CHEST - 1 VIEW
1 series · 1 of 1 positions shown · non-contrast
Comparison: 10/20/2018

CLINICAL DATA: Shortness of Breath. Abnormal lung sounds on the
right

EXAM:
PORTABLE CHEST 1 VIEW

[chest]
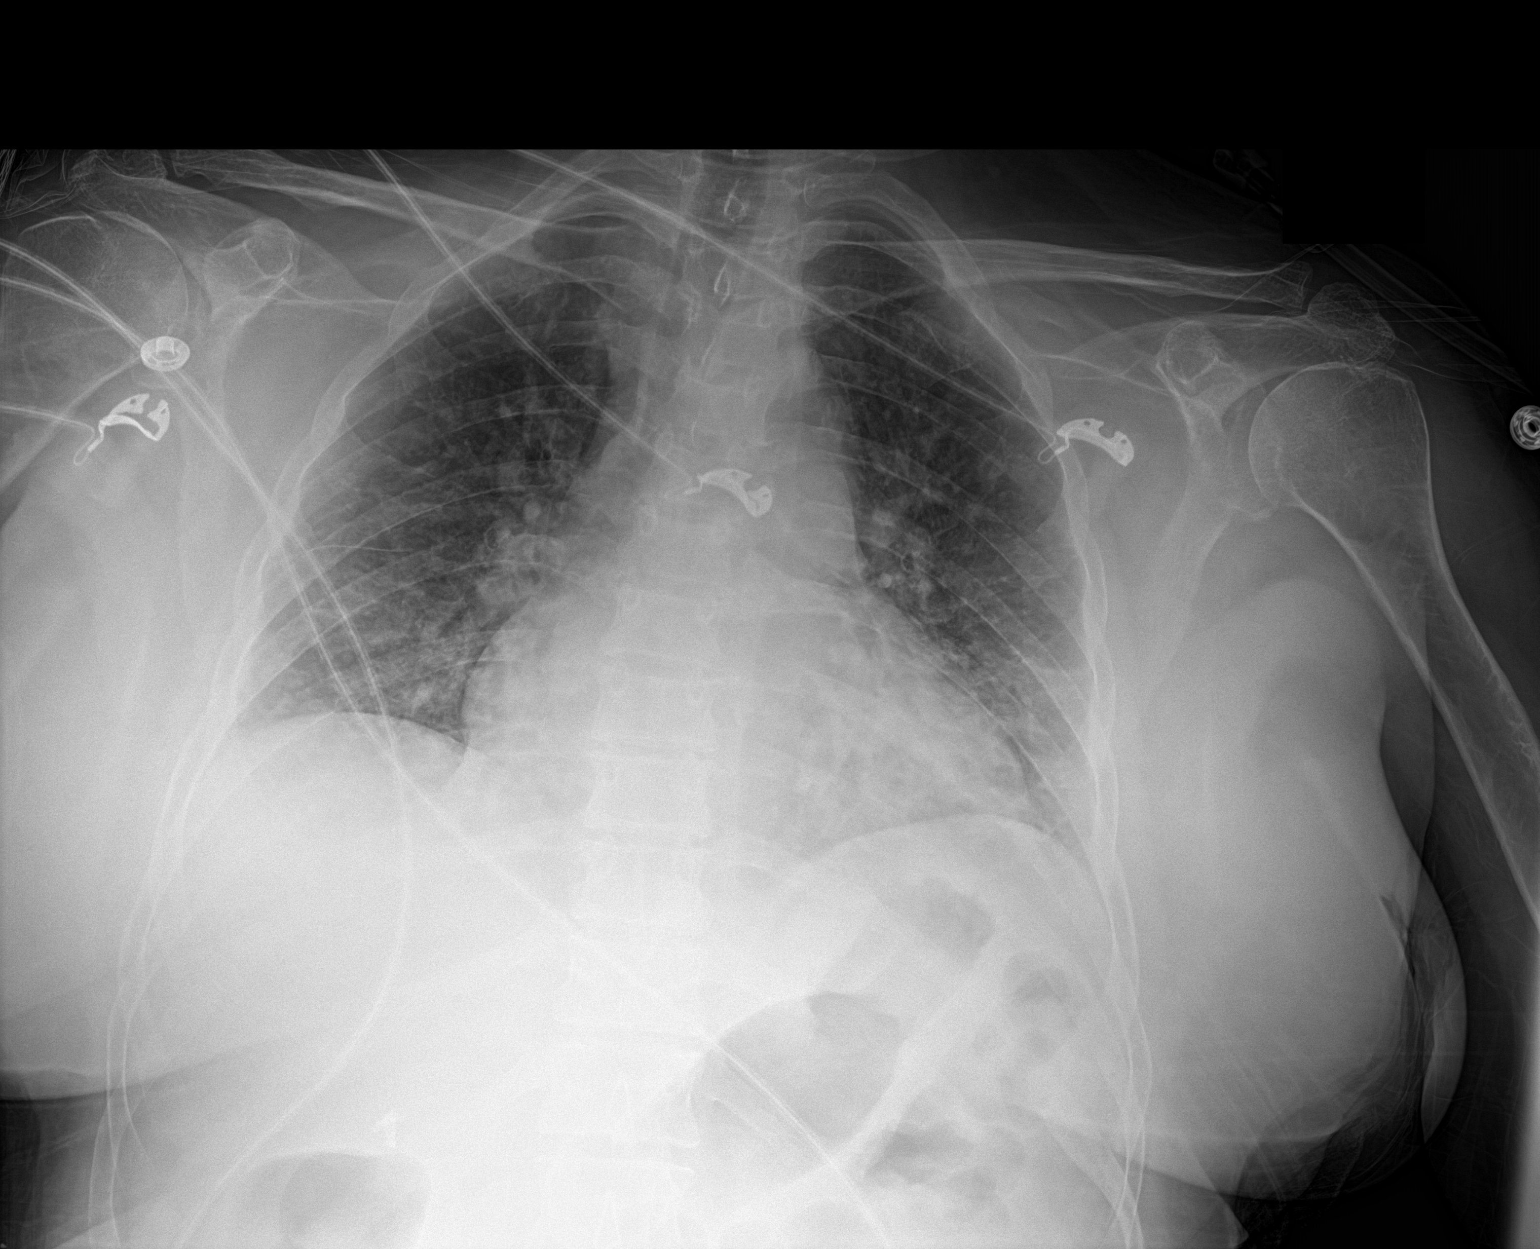

[1 of 1 positions shown; findings below may reference images not displayed]

FINDINGS: Cardiomegaly. Patchy airspace disease in the left lower lobe. No
confluent opacity on the right. No effusions or acute bony
abnormality.
IMPRESSION: Cardiomegaly.

Patchy left lower lobe airspace opacity, possible pneumonia.
